# Patient Record
Sex: Male | Born: 1935 | Race: White | Hispanic: No | Marital: Married | Smoking: Former smoker
Health system: Southern US, Community
[De-identification: ages and names within clinical notes are randomized; demographics above are authoritative.]

## PROBLEM LIST (undated history)

## (undated) DIAGNOSIS — I10 Essential (primary) hypertension: Secondary | ICD-10-CM

## (undated) DIAGNOSIS — M1990 Unspecified osteoarthritis, unspecified site: Secondary | ICD-10-CM

## (undated) DIAGNOSIS — C884 Extranodal marginal zone b-cell lymphoma of mucosa-associated lymphoid tissue (malt-lymphoma) not having achieved remission: Secondary | ICD-10-CM

## (undated) DIAGNOSIS — I701 Atherosclerosis of renal artery: Secondary | ICD-10-CM

## (undated) DIAGNOSIS — R001 Bradycardia, unspecified: Secondary | ICD-10-CM

## (undated) DIAGNOSIS — E785 Hyperlipidemia, unspecified: Secondary | ICD-10-CM

## (undated) DIAGNOSIS — I428 Other cardiomyopathies: Secondary | ICD-10-CM

## (undated) DIAGNOSIS — G459 Transient cerebral ischemic attack, unspecified: Secondary | ICD-10-CM

## (undated) DIAGNOSIS — I5042 Chronic combined systolic (congestive) and diastolic (congestive) heart failure: Secondary | ICD-10-CM

## (undated) DIAGNOSIS — K219 Gastro-esophageal reflux disease without esophagitis: Secondary | ICD-10-CM

## (undated) DIAGNOSIS — I350 Nonrheumatic aortic (valve) stenosis: Secondary | ICD-10-CM

## (undated) DIAGNOSIS — I2510 Atherosclerotic heart disease of native coronary artery without angina pectoris: Secondary | ICD-10-CM

## (undated) DIAGNOSIS — K5792 Diverticulitis of intestine, part unspecified, without perforation or abscess without bleeding: Secondary | ICD-10-CM

## (undated) DIAGNOSIS — N529 Male erectile dysfunction, unspecified: Secondary | ICD-10-CM

## (undated) HISTORY — DX: Essential (primary) hypertension: I10

## (undated) HISTORY — DX: Hyperlipidemia, unspecified: E78.5

## (undated) HISTORY — DX: Other cardiomyopathies: I42.8

## (undated) HISTORY — DX: Extranodal marginal zone b-cell lymphoma of mucosa-associated lymphoid tissue (malt-lymphoma) not having achieved remission: C88.40

## (undated) HISTORY — PX: ESOPHAGOGASTRODUODENOSCOPY: SHX1529

## (undated) HISTORY — DX: Chronic combined systolic (congestive) and diastolic (congestive) heart failure: I50.42

## (undated) HISTORY — DX: Extranodal marginal zone B-cell lymphoma of mucosa-associated lymphoid tissue (MALT-lymphoma): C88.4

## (undated) HISTORY — DX: Unspecified osteoarthritis, unspecified site: M19.90

## (undated) HISTORY — DX: Gastro-esophageal reflux disease without esophagitis: K21.9

## (undated) HISTORY — DX: Male erectile dysfunction, unspecified: N52.9

## (undated) HISTORY — DX: Diverticulitis of intestine, part unspecified, without perforation or abscess without bleeding: K57.92

## (undated) HISTORY — DX: Atherosclerotic heart disease of native coronary artery without angina pectoris: I25.10

## (undated) HISTORY — PX: INGUINAL HERNIA REPAIR: SUR1180

---
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
## 1998-02-13 ENCOUNTER — Ambulatory Visit (HOSPITAL_BASED_OUTPATIENT_CLINIC_OR_DEPARTMENT_OTHER): Admission: RE | Admit: 1998-02-13 | Discharge: 1998-02-13 | Payer: Self-pay | Admitting: Surgery

## 1998-06-17 ENCOUNTER — Ambulatory Visit (HOSPITAL_COMMUNITY): Admission: RE | Admit: 1998-06-17 | Discharge: 1998-06-17 | Payer: Self-pay | Admitting: Gastroenterology

## 1998-06-27 ENCOUNTER — Ambulatory Visit (HOSPITAL_COMMUNITY): Admission: RE | Admit: 1998-06-27 | Discharge: 1998-06-27 | Payer: Self-pay | Admitting: Gastroenterology

## 1998-06-30 ENCOUNTER — Ambulatory Visit (HOSPITAL_COMMUNITY): Admission: RE | Admit: 1998-06-30 | Discharge: 1998-06-30 | Payer: Self-pay | Admitting: Gastroenterology

## 1998-06-30 ENCOUNTER — Encounter: Payer: Self-pay | Admitting: Gastroenterology

## 1998-08-14 ENCOUNTER — Ambulatory Visit (HOSPITAL_COMMUNITY): Admission: RE | Admit: 1998-08-14 | Discharge: 1998-08-14 | Payer: Self-pay | Admitting: Gastroenterology

## 1998-10-20 ENCOUNTER — Encounter: Payer: Self-pay | Admitting: Hematology & Oncology

## 1998-10-20 ENCOUNTER — Ambulatory Visit (HOSPITAL_COMMUNITY): Admission: RE | Admit: 1998-10-20 | Discharge: 1998-10-20 | Payer: Self-pay | Admitting: Hematology & Oncology

## 1998-12-19 ENCOUNTER — Emergency Department (HOSPITAL_COMMUNITY): Admission: EM | Admit: 1998-12-19 | Discharge: 1998-12-19 | Payer: Self-pay

## 1999-03-11 ENCOUNTER — Encounter (INDEPENDENT_AMBULATORY_CARE_PROVIDER_SITE_OTHER): Payer: Self-pay

## 1999-03-11 ENCOUNTER — Ambulatory Visit (HOSPITAL_COMMUNITY): Admission: RE | Admit: 1999-03-11 | Discharge: 1999-03-11 | Payer: Self-pay | Admitting: Gastroenterology

## 1999-04-20 HISTORY — PX: AORTIC VALVE REPLACEMENT: SHX41

## 1999-04-20 HISTORY — PX: CORONARY ARTERY BYPASS GRAFT: SHX141

## 1999-08-27 ENCOUNTER — Ambulatory Visit (HOSPITAL_COMMUNITY): Admission: RE | Admit: 1999-08-27 | Discharge: 1999-08-27 | Payer: Self-pay | Admitting: Gastroenterology

## 1999-08-27 ENCOUNTER — Encounter (INDEPENDENT_AMBULATORY_CARE_PROVIDER_SITE_OTHER): Payer: Self-pay | Admitting: Specialist

## 2000-02-22 ENCOUNTER — Encounter: Payer: Self-pay | Admitting: Cardiology

## 2000-02-22 ENCOUNTER — Encounter: Admission: RE | Admit: 2000-02-22 | Discharge: 2000-02-22 | Payer: Self-pay | Admitting: Cardiology

## 2000-02-29 ENCOUNTER — Ambulatory Visit (HOSPITAL_COMMUNITY): Admission: RE | Admit: 2000-02-29 | Discharge: 2000-02-29 | Payer: Self-pay | Admitting: Cardiology

## 2000-02-29 ENCOUNTER — Encounter (HOSPITAL_COMMUNITY): Payer: Self-pay | Admitting: Dentistry

## 2000-03-03 ENCOUNTER — Encounter: Payer: Self-pay | Admitting: Cardiothoracic Surgery

## 2000-03-03 ENCOUNTER — Inpatient Hospital Stay (HOSPITAL_COMMUNITY): Admission: RE | Admit: 2000-03-03 | Discharge: 2000-03-09 | Payer: Self-pay | Admitting: Cardiothoracic Surgery

## 2000-03-03 ENCOUNTER — Encounter (INDEPENDENT_AMBULATORY_CARE_PROVIDER_SITE_OTHER): Payer: Self-pay | Admitting: Specialist

## 2000-03-04 ENCOUNTER — Encounter: Payer: Self-pay | Admitting: Cardiothoracic Surgery

## 2000-03-05 ENCOUNTER — Encounter: Payer: Self-pay | Admitting: Cardiothoracic Surgery

## 2000-03-05 ENCOUNTER — Encounter: Payer: Self-pay | Admitting: Thoracic Surgery (Cardiothoracic Vascular Surgery)

## 2000-04-01 ENCOUNTER — Encounter: Admission: RE | Admit: 2000-04-01 | Discharge: 2000-04-01 | Payer: Self-pay | Admitting: Cardiothoracic Surgery

## 2000-04-01 ENCOUNTER — Encounter: Payer: Self-pay | Admitting: Cardiothoracic Surgery

## 2000-04-26 ENCOUNTER — Encounter (HOSPITAL_COMMUNITY): Admission: RE | Admit: 2000-04-26 | Discharge: 2000-07-25 | Payer: Self-pay | Admitting: Cardiology

## 2000-10-25 ENCOUNTER — Encounter (INDEPENDENT_AMBULATORY_CARE_PROVIDER_SITE_OTHER): Payer: Self-pay | Admitting: Specialist

## 2000-10-25 ENCOUNTER — Ambulatory Visit (HOSPITAL_COMMUNITY): Admission: RE | Admit: 2000-10-25 | Discharge: 2000-10-25 | Payer: Self-pay | Admitting: Gastroenterology

## 2001-11-15 ENCOUNTER — Ambulatory Visit (HOSPITAL_COMMUNITY): Admission: RE | Admit: 2001-11-15 | Discharge: 2001-11-15 | Payer: Self-pay | Admitting: Gastroenterology

## 2001-11-15 ENCOUNTER — Encounter (INDEPENDENT_AMBULATORY_CARE_PROVIDER_SITE_OTHER): Payer: Self-pay | Admitting: Specialist

## 2002-04-25 ENCOUNTER — Encounter: Payer: Self-pay | Admitting: Internal Medicine

## 2004-04-01 ENCOUNTER — Encounter: Admission: RE | Admit: 2004-04-01 | Discharge: 2004-04-01 | Payer: Self-pay | Admitting: Gastroenterology

## 2004-04-01 ENCOUNTER — Encounter: Payer: Self-pay | Admitting: Internal Medicine

## 2004-06-29 ENCOUNTER — Ambulatory Visit: Payer: Self-pay | Admitting: Internal Medicine

## 2004-08-31 ENCOUNTER — Ambulatory Visit: Payer: Self-pay | Admitting: Internal Medicine

## 2005-01-21 ENCOUNTER — Ambulatory Visit: Payer: Self-pay | Admitting: Internal Medicine

## 2005-07-21 ENCOUNTER — Ambulatory Visit: Payer: Self-pay | Admitting: Internal Medicine

## 2005-08-23 ENCOUNTER — Ambulatory Visit: Payer: Self-pay | Admitting: Internal Medicine

## 2005-10-04 ENCOUNTER — Ambulatory Visit: Payer: Self-pay | Admitting: Internal Medicine

## 2005-10-11 ENCOUNTER — Encounter: Payer: Self-pay | Admitting: Internal Medicine

## 2006-01-17 ENCOUNTER — Ambulatory Visit: Payer: Self-pay | Admitting: Internal Medicine

## 2006-04-21 ENCOUNTER — Encounter: Payer: Self-pay | Admitting: Internal Medicine

## 2006-07-21 ENCOUNTER — Ambulatory Visit: Payer: Self-pay | Admitting: Internal Medicine

## 2006-07-21 LAB — CONVERTED CEMR LAB
CO2: 32 meq/L (ref 19–32)
Calcium: 9.3 mg/dL (ref 8.4–10.5)
Cholesterol: 169 mg/dL (ref 0–200)
Direct LDL: 98.3 mg/dL
GFR calc Af Amer: 121 mL/min
Glucose, Bld: 105 mg/dL — ABNORMAL HIGH (ref 70–99)
Phosphorus: 3.7 mg/dL (ref 2.3–4.6)
Potassium: 3.8 meq/L (ref 3.5–5.1)
Sodium: 139 meq/L (ref 135–145)
Total CHOL/HDL Ratio: 3.4
Triglycerides: 235 mg/dL (ref 0–149)
VLDL: 47 mg/dL — ABNORMAL HIGH (ref 0–40)

## 2006-09-07 ENCOUNTER — Encounter: Payer: Self-pay | Admitting: Internal Medicine

## 2006-09-18 DIAGNOSIS — G459 Transient cerebral ischemic attack, unspecified: Secondary | ICD-10-CM

## 2006-09-18 HISTORY — DX: Transient cerebral ischemic attack, unspecified: G45.9

## 2006-09-27 ENCOUNTER — Telehealth (INDEPENDENT_AMBULATORY_CARE_PROVIDER_SITE_OTHER): Payer: Self-pay | Admitting: *Deleted

## 2006-10-05 ENCOUNTER — Encounter: Payer: Self-pay | Admitting: Internal Medicine

## 2006-10-05 ENCOUNTER — Ambulatory Visit: Payer: Self-pay | Admitting: Vascular Surgery

## 2006-10-05 ENCOUNTER — Observation Stay (HOSPITAL_COMMUNITY): Admission: EM | Admit: 2006-10-05 | Discharge: 2006-10-05 | Payer: Self-pay | Admitting: Emergency Medicine

## 2006-10-05 ENCOUNTER — Ambulatory Visit: Payer: Self-pay | Admitting: Internal Medicine

## 2006-10-07 DIAGNOSIS — M1990 Unspecified osteoarthritis, unspecified site: Secondary | ICD-10-CM | POA: Insufficient documentation

## 2006-10-07 DIAGNOSIS — K219 Gastro-esophageal reflux disease without esophagitis: Secondary | ICD-10-CM

## 2006-10-07 DIAGNOSIS — J309 Allergic rhinitis, unspecified: Secondary | ICD-10-CM | POA: Insufficient documentation

## 2006-10-07 DIAGNOSIS — G20 Parkinson's disease: Secondary | ICD-10-CM

## 2006-10-07 DIAGNOSIS — K5730 Diverticulosis of large intestine without perforation or abscess without bleeding: Secondary | ICD-10-CM | POA: Insufficient documentation

## 2006-10-07 DIAGNOSIS — M810 Age-related osteoporosis without current pathological fracture: Secondary | ICD-10-CM | POA: Insufficient documentation

## 2006-10-07 DIAGNOSIS — C884 Extranodal marginal zone B-cell lymphoma of mucosa-associated lymphoid tissue [MALT-lymphoma]: Secondary | ICD-10-CM

## 2006-10-11 ENCOUNTER — Telehealth (INDEPENDENT_AMBULATORY_CARE_PROVIDER_SITE_OTHER): Payer: Self-pay | Admitting: *Deleted

## 2006-10-13 ENCOUNTER — Ambulatory Visit: Payer: Self-pay | Admitting: Internal Medicine

## 2006-10-13 DIAGNOSIS — F0280 Dementia in other diseases classified elsewhere without behavioral disturbance: Secondary | ICD-10-CM

## 2006-10-13 DIAGNOSIS — E785 Hyperlipidemia, unspecified: Secondary | ICD-10-CM

## 2006-10-13 DIAGNOSIS — G20 Parkinson's disease: Secondary | ICD-10-CM

## 2006-10-20 ENCOUNTER — Encounter: Payer: Self-pay | Admitting: Internal Medicine

## 2006-10-27 ENCOUNTER — Encounter: Payer: Self-pay | Admitting: Internal Medicine

## 2006-12-01 ENCOUNTER — Encounter (INDEPENDENT_AMBULATORY_CARE_PROVIDER_SITE_OTHER): Payer: Self-pay | Admitting: *Deleted

## 2006-12-21 ENCOUNTER — Encounter: Payer: Self-pay | Admitting: Internal Medicine

## 2007-01-20 ENCOUNTER — Ambulatory Visit: Payer: Self-pay | Admitting: Internal Medicine

## 2007-05-22 ENCOUNTER — Ambulatory Visit: Payer: Self-pay | Admitting: Internal Medicine

## 2007-05-24 LAB — CONVERTED CEMR LAB
ALT: 14 units/L (ref 0–53)
BUN: 17 mg/dL (ref 6–23)
Basophils Relative: 0 % (ref 0.0–1.0)
CO2: 34 meq/L — ABNORMAL HIGH (ref 19–32)
Calcium: 9.8 mg/dL (ref 8.4–10.5)
Chloride: 98 meq/L (ref 96–112)
Eosinophils Absolute: 0.5 10*3/uL (ref 0.0–0.6)
Eosinophils Relative: 5 % (ref 0.0–5.0)
GFR calc Af Amer: 93 mL/min
Glucose, Bld: 97 mg/dL (ref 70–99)
HCT: 45.6 % (ref 39.0–52.0)
LDL Cholesterol: 96 mg/dL (ref 0–99)
Neutrophils Relative %: 66.8 % (ref 43.0–77.0)
Platelets: 339 10*3/uL (ref 150–400)
Potassium: 4 meq/L (ref 3.5–5.1)
RBC: 5.01 M/uL (ref 4.22–5.81)
RDW: 12.6 % (ref 11.5–14.6)
TSH: 3.31 microintl units/mL (ref 0.35–5.50)
Total CHOL/HDL Ratio: 3.5
Triglycerides: 181 mg/dL — ABNORMAL HIGH (ref 0–149)
VLDL: 36 mg/dL (ref 0–40)
WBC: 9.3 10*3/uL (ref 4.5–10.5)

## 2007-06-07 ENCOUNTER — Encounter: Payer: Self-pay | Admitting: Internal Medicine

## 2007-09-13 ENCOUNTER — Telehealth: Payer: Self-pay | Admitting: Internal Medicine

## 2007-09-18 HISTORY — PX: CATARACT EXTRACTION: SUR2

## 2007-09-22 ENCOUNTER — Ambulatory Visit: Payer: Self-pay | Admitting: Internal Medicine

## 2007-11-07 ENCOUNTER — Encounter: Payer: Self-pay | Admitting: Internal Medicine

## 2007-11-18 HISTORY — PX: CORONARY STENT PLACEMENT: SHX1402

## 2007-11-22 ENCOUNTER — Encounter: Payer: Self-pay | Admitting: Internal Medicine

## 2007-11-22 HISTORY — PX: NM MYOCAR PERF WALL MOTION: HXRAD629

## 2007-12-01 ENCOUNTER — Encounter: Admission: RE | Admit: 2007-12-01 | Discharge: 2007-12-01 | Payer: Self-pay | Admitting: Cardiology

## 2007-12-05 ENCOUNTER — Inpatient Hospital Stay (HOSPITAL_COMMUNITY): Admission: AD | Admit: 2007-12-05 | Discharge: 2007-12-06 | Payer: Self-pay | Admitting: Cardiovascular Disease

## 2007-12-06 ENCOUNTER — Encounter: Payer: Self-pay | Admitting: Internal Medicine

## 2007-12-22 ENCOUNTER — Encounter: Payer: Self-pay | Admitting: Internal Medicine

## 2008-01-15 ENCOUNTER — Encounter: Payer: Self-pay | Admitting: Internal Medicine

## 2008-01-29 ENCOUNTER — Ambulatory Visit: Payer: Self-pay | Admitting: Internal Medicine

## 2008-01-31 ENCOUNTER — Encounter: Payer: Self-pay | Admitting: Internal Medicine

## 2008-03-06 ENCOUNTER — Encounter: Payer: Self-pay | Admitting: Internal Medicine

## 2008-03-28 ENCOUNTER — Ambulatory Visit: Payer: Self-pay | Admitting: Internal Medicine

## 2008-04-04 ENCOUNTER — Encounter: Payer: Self-pay | Admitting: Internal Medicine

## 2008-04-04 ENCOUNTER — Telehealth: Payer: Self-pay | Admitting: Internal Medicine

## 2008-04-23 ENCOUNTER — Telehealth: Payer: Self-pay | Admitting: Family Medicine

## 2008-04-30 ENCOUNTER — Ambulatory Visit: Payer: Self-pay | Admitting: Internal Medicine

## 2008-05-01 LAB — CONVERTED CEMR LAB
Albumin: 3.8 g/dL (ref 3.5–5.2)
Basophils Absolute: 0 10*3/uL (ref 0.0–0.1)
Basophils Relative: 0.4 % (ref 0.0–3.0)
CO2: 34 meq/L — ABNORMAL HIGH (ref 19–32)
Chloride: 105 meq/L (ref 96–112)
Cholesterol: 174 mg/dL (ref 0–200)
Eosinophils Absolute: 0.4 10*3/uL (ref 0.0–0.7)
Eosinophils Relative: 5.5 % — ABNORMAL HIGH (ref 0.0–5.0)
GFR calc Af Amer: 105 mL/min
GFR calc non Af Amer: 87 mL/min
HCT: 43.2 % (ref 39.0–52.0)
HDL: 51.5 mg/dL (ref >39.0)
MCHC: 34.4 g/dL (ref 30.0–36.0)
MCV: 90.5 fL (ref 78.0–100.0)
Monocytes Absolute: 0.7 10*3/uL (ref 0.1–1.0)
Potassium: 4.3 meq/L (ref 3.5–5.1)
RBC: 4.78 M/uL (ref 4.22–5.81)
Sodium: 141 meq/L (ref 135–145)
Total Protein: 6.9 g/dL (ref 6.0–8.3)
VLDL: 10 mg/dL (ref 0–40)
Vitamin B-12: 273 pg/mL (ref 211–911)
WBC: 7.2 10*3/uL (ref 4.5–10.5)

## 2008-05-15 ENCOUNTER — Ambulatory Visit: Payer: Self-pay | Admitting: Internal Medicine

## 2008-06-11 ENCOUNTER — Encounter: Payer: Self-pay | Admitting: Internal Medicine

## 2008-07-03 ENCOUNTER — Encounter: Payer: Self-pay | Admitting: Internal Medicine

## 2008-07-31 ENCOUNTER — Telehealth: Payer: Self-pay | Admitting: Internal Medicine

## 2008-09-30 ENCOUNTER — Ambulatory Visit: Payer: Self-pay | Admitting: Internal Medicine

## 2008-10-02 LAB — CONVERTED CEMR LAB
ALT: 5 units/L (ref 0–53)
AST: 21 units/L (ref 0–37)
Albumin: 3.9 g/dL (ref 3.5–5.2)
Cholesterol: 182 mg/dL (ref 0–200)
Glucose, Bld: 78 mg/dL (ref 70–99)
HDL: 57.1 mg/dL (ref >39.00)
Phosphorus: 3.8 mg/dL (ref 2.3–4.6)
Potassium: 4.4 meq/L (ref 3.5–5.1)
Sodium: 141 meq/L (ref 135–145)
Total Bilirubin: 0.7 mg/dL (ref 0.3–1.2)
Total Protein: 6.8 g/dL (ref 6.0–8.3)
Triglycerides: 128 mg/dL (ref 0.0–149.0)

## 2008-10-28 ENCOUNTER — Encounter: Payer: Self-pay | Admitting: Internal Medicine

## 2008-12-31 ENCOUNTER — Telehealth: Payer: Self-pay | Admitting: Internal Medicine

## 2009-01-29 ENCOUNTER — Ambulatory Visit: Payer: Self-pay | Admitting: Internal Medicine

## 2009-02-18 ENCOUNTER — Encounter: Payer: Self-pay | Admitting: Internal Medicine

## 2009-04-02 ENCOUNTER — Ambulatory Visit: Payer: Self-pay | Admitting: Internal Medicine

## 2009-04-04 LAB — CONVERTED CEMR LAB
ALT: 6 units/L (ref 0–53)
AST: 18 units/L (ref 0–37)
BUN: 9 mg/dL (ref 6–23)
CO2: 33 meq/L — ABNORMAL HIGH (ref 19–32)
Chloride: 102 meq/L (ref 96–112)
Eosinophils Relative: 4.5 % (ref 0.0–5.0)
GFR calc non Af Amer: 99.35 mL/min (ref >60)
HCT: 42.6 % (ref 39.0–52.0)
Hemoglobin: 14.5 g/dL (ref 13.0–17.0)
Lymphs Abs: 1.5 10*3/uL (ref 0.7–4.0)
Monocytes Relative: 8.4 % (ref 3.0–12.0)
Neutro Abs: 5.7 10*3/uL (ref 1.4–7.7)
TSH: 2.96 microintl units/mL (ref 0.35–5.50)
Total CHOL/HDL Ratio: 3
Total Protein: 6.8 g/dL (ref 6.0–8.3)
Triglycerides: 78 mg/dL (ref 0.0–149.0)
WBC: 8.4 10*3/uL (ref 4.5–10.5)

## 2009-05-01 ENCOUNTER — Telehealth: Payer: Self-pay | Admitting: Internal Medicine

## 2009-06-20 ENCOUNTER — Encounter: Payer: Self-pay | Admitting: Internal Medicine

## 2009-08-03 ENCOUNTER — Emergency Department (HOSPITAL_COMMUNITY): Admission: EM | Admit: 2009-08-03 | Discharge: 2009-08-03 | Payer: Self-pay | Admitting: Emergency Medicine

## 2009-08-03 ENCOUNTER — Encounter: Payer: Self-pay | Admitting: Internal Medicine

## 2009-09-09 ENCOUNTER — Encounter: Payer: Self-pay | Admitting: Internal Medicine

## 2009-09-29 ENCOUNTER — Ambulatory Visit: Payer: Self-pay | Admitting: Internal Medicine

## 2009-10-17 HISTORY — PX: LAPAROSCOPIC CHOLECYSTECTOMY: SUR755

## 2009-11-03 ENCOUNTER — Observation Stay (HOSPITAL_COMMUNITY): Admission: RE | Admit: 2009-11-03 | Discharge: 2009-11-04 | Payer: Self-pay | Admitting: General Surgery

## 2009-11-03 ENCOUNTER — Encounter (INDEPENDENT_AMBULATORY_CARE_PROVIDER_SITE_OTHER): Payer: Self-pay | Admitting: General Surgery

## 2009-11-24 ENCOUNTER — Encounter: Payer: Self-pay | Admitting: Internal Medicine

## 2009-12-25 ENCOUNTER — Encounter: Payer: Self-pay | Admitting: Internal Medicine

## 2010-01-20 ENCOUNTER — Ambulatory Visit: Payer: Self-pay | Admitting: Internal Medicine

## 2010-02-18 ENCOUNTER — Encounter: Payer: Self-pay | Admitting: Internal Medicine

## 2010-03-02 ENCOUNTER — Ambulatory Visit: Payer: Self-pay | Admitting: Internal Medicine

## 2010-03-03 LAB — CONVERTED CEMR LAB
ALT: 6 units/L (ref 0–53)
Bilirubin, Direct: 0.1 mg/dL (ref 0.0–0.3)
Creatinine, Ser: 0.8 mg/dL (ref 0.4–1.5)
Eosinophils Relative: 5 % (ref 0.0–5.0)
GFR calc non Af Amer: 100.57 mL/min (ref >60)
Glucose, Bld: 96 mg/dL (ref 70–99)
MCV: 92.9 fL (ref 78.0–100.0)
Monocytes Absolute: 0.7 10*3/uL (ref 0.1–1.0)
Monocytes Relative: 7.8 % (ref 3.0–12.0)
Neutrophils Relative %: 65.9 % (ref 43.0–77.0)
Platelets: 300 10*3/uL (ref 150.0–400.0)
Sodium: 137 meq/L (ref 135–145)
Total Bilirubin: 0.6 mg/dL (ref 0.3–1.2)
WBC: 9.6 10*3/uL (ref 4.5–10.5)

## 2010-03-06 ENCOUNTER — Encounter (INDEPENDENT_AMBULATORY_CARE_PROVIDER_SITE_OTHER): Payer: Self-pay | Admitting: *Deleted

## 2010-05-19 NOTE — Letter (Signed)
Summary: [facility]   Imported By: Lanelle Bal 12/11/2009 08:39:44  _____________________________________________________________________  External Attachment:    Type:   Image     Comment:   External Document  Appended Document: [facility] post op lap chole and umbilical hernia repair doing okay as needed follow up

## 2010-05-19 NOTE — Assessment & Plan Note (Signed)
Summary: 6 m f/u dlo   Vital Signs:  Patient profile:   77 year old male Weight:      166 pounds BMI:     26.09 Temp:     98.5 degrees F oral Pulse rate:   72 / minute Pulse rhythm:   regular BP sitting:   120 / 60  (left arm) Cuff size:   large  Vitals Entered By: Mervin Hack CMA Duncan Dull) (March 02, 2010 11:07 AM) CC: 6 month follow-up   History of Present Illness: Recovered from gallbladder surgery in July Saw Dr Allyson Sabal from Dr Clarene Duke due to change in renal ultrasound Plans to just repeat the doppler before considering stents  Parkinsons is slowly worsening---increasing intensity of the tremors has trouble with fine movements. Able to eat but takes longer--has trouble with soup. has to be very careful with liquids He has decided against the deep brain stimulation  No stomach trouble continues on PPI but only as needed   No arthritis problems occ has hand stiffness  No chest pain No SOB Not checking home unit---not consistent  Allergies: 1)  ! * Hctz 2)  Penicillin G Potassium (Penicillin G Potassium) 3)  Zocor (Simvastatin) 4)  Sinemet (Carbidopa-Levodopa) 5)  Mirapex (Pramipexole Dihydrochloride) 6)  Amlodipine Besylate (Amlodipine Besylate)  Past History:  Past medical, surgical, family and social histories (including risk factors) reviewed for relevance to current acute and chronic problems.  Past Medical History: Reviewed history from 09/30/2008 and no changes required. Coronary artery disease-------------------------------------Dr Little Allergic rhinitis Diverticulosis, colon GERD Hypertension Osteoarthritis--spine Osteoporosis--hip MALT lymphoma Parkinson's ASCVD---TIA  6/08 Hyperlipidemia Erectile dysfunction  Past Surgical History: Reviewed history from 12/24/2009 and no changes required. Coronary artery bypass graft Zenaida Niece trigt ) aortic valve replacement 2001 Cataract extraction right  03/2003 EGD  multiple Pilonidal cyst 1953 LIH  1997Llap. bilateral hernias 1998 Cardiolite neg.  ef 64% 07/2004 Colon/ EGD benign 11/2004 8/09  Stent by Dr Rosie Fate started and aggrenox stopped 6/09 Left cataract 7/11 Lap chole    Dr Abbey Chatters  Family History: Reviewed history from 10/07/2006 and no changes required. Dad died @46  acute bronchitis, cotton exposure Mom died @91  old age 70 brothers--1 died of heart trouble 1 sister Daughter died of osteosarcoma  Social History: Reviewed history from 05/22/2007 and no changes required. 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   Neck:  supple, no masses, no thyromegaly, no carotid bruits, and no cervical lymphadenopathy.   Lungs:  normal respiratory effort, no intercostal retractions, no accessory muscle use, and normal breath sounds.   Heart:  normal rate and no gallop.   Gr 3/6 systolic murmur at base Bigemial rhythm Abdomen:  soft and non-tender no bruits Extremities:  no edema Neurologic:  resting tremor mostly in hands and slight lips moderate bradykinesia mild increased tone Psych:  normally interactive, good eye contact, not anxious appearing, and not depressed appearing.     Impression & Recommendations:  Problem # 1:  HYPERTENSION (ICD-401.9) Assessment Unchanged  good control no changes needed argues against sig renal artery stenosis  His updated medication list for this problem includes:  Bisoprolol Fumarate 5 Mg Tabs (Bisoprolol fumarate) .Marland Kitchen... Take 1 tablet by mouth once a day  BP today: 120/60 Prior BP: 126/62 (09/29/2009)  Labs Reviewed: K+:  4.1 (04/02/2009) Creat: : 0.8 (04/02/2009)   Chol: 175 (04/02/2009)   HDL: 53.60 (04/02/2009)   LDL: 106 (04/02/2009)   TG: 78.0 (04/02/2009)  Orders: Venipuncture (98119) TLB-Renal Function Panel (80069-RENAL) TLB-CBC Platelet - w/Differential (85025-CBCD) TLB-Hepatic/Liver Function Pnl (80076-HEPATIC) TLB-TSH (Thyroid Stimulating Hormone) (84443-TSH)  Problem # 2:  PARKINSON'S DISEASE (ICD-332.0) Assessment: Unchanged moderate with sig disability has decided against deep brain stimulation  Problem # 3:  CORONARY ARTERY DISEASE (ICD-414.00) Assessment: Unchanged has been quiet  His updated medication list for this problem includes:    Bisoprolol Fumarate 5 Mg Tabs (Bisoprolol fumarate) .Marland Kitchen... Take 1 tablet by mouth once a day    Aspirin 325 Mg Tabs (Aspirin) .Marland Kitchen... Take 1 by mouth once daily  Problem # 4:  HYPERLIPIDEMIA (ICD-272.4) had been on meds in the past but had side effects LDL only 106 without meds won't recheck or treat  Labs Reviewed: SGOT: 18 (04/02/2009)   SGPT: 6 (04/02/2009)   HDL:53.60 (04/02/2009), 57.10 (09/30/2008)  LDL:106 (04/02/2009), 99 (14/78/2956)  Chol:175 (04/02/2009), 182 (09/30/2008)  Trig:78.0 (04/02/2009), 128.0 (09/30/2008)  Complete Medication List: 1)  Bisoprolol Fumarate 5 Mg Tabs (Bisoprolol fumarate) .... Take 1 tablet by mouth once a day 2)  Carbidopa-levodopa Cr 25-100 Mg Cr-tabs (Carbidopa-levodopa) .... Take 5 tablets by mouth daily 3)  Pantoprazole Sodium 40 Mg Tbec (Pantoprazole sodium) .... Take 1 by mouth once daily as needed 4)  Ropinirole Hcl 0.25 Mg Tabs (Ropinirole hcl) .... Take 1 by mouth three times a day 5)  Aspirin 325 Mg Tabs (Aspirin) .... Take 1 by mouth once daily  Patient Instructions: 1)  Please schedule a follow-up appointment in 6 months .    Orders Added: 1)  Est. Patient Level IV [21308] 2)  Venipuncture [36415] 3)  TLB-Renal Function Panel [80069-RENAL] 4)  TLB-CBC Platelet - w/Differential [85025-CBCD] 5)   TLB-Hepatic/Liver Function Pnl [80076-HEPATIC] 6)  TLB-TSH (Thyroid Stimulating Hormone) [65784-ONG]    Current Allergies (reviewed today): ! * HCTZ PENICILLIN G POTASSIUM (PENICILLIN G POTASSIUM) ZOCOR (SIMVASTATIN) SINEMET (CARBIDOPA-LEVODOPA) MIRAPEX (PRAMIPEXOLE DIHYDROCHLORIDE) AMLODIPINE BESYLATE (AMLODIPINE BESYLATE)

## 2010-05-19 NOTE — Letter (Signed)
Summary: [place] No Show Letter  [place] [facility]  [address] [place], [place] [address]   Phone: [phone]  Fax: [phone]    03/06/2010 MRN: [id number]  SANTIEL TOPPER [address] [place], [place]  [address]   Dear Mr. [facility],   Our records indicate that you missed your scheduled appointment with __Lab___________________ on __11.17.11__________.  Please contact this office to reschedule your appointment as soon as possible.  It is important that you keep your scheduled appointments with your physician, so we can provide you the best care possible.  Please be advised that there may be a charge for "no show" appointments.    Sincerely,   [place] at [facility]

## 2010-05-19 NOTE — Letter (Signed)
Summary: [facility]  [facility]   Imported By: Lanelle Bal 09/17/2009 10:10:10  _____________________________________________________________________  External Attachment:    Type:   Image     Comment:   External Document  Appended Document: [facility] medically refractory Parkinsons recommending deep brain stimulation but patient not sure---he will consider

## 2010-05-19 NOTE — Assessment & Plan Note (Signed)
Summary: 6 M F/U DLO   Vital Signs:  Patient profile:   77 year old male Weight:      167 pounds Temp:     98.5 degrees F oral Pulse rate:   68 / minute Pulse rhythm:   regular BP sitting:   126 / 62  (left arm) Cuff size:   large  Vitals Entered By: Mervin Hack CMA Duncan Dull) (September 29, 2009 10:42 AM) CC: 6 month follow-up   History of Present Illness: Doing okay Scheduled to have gallbaldder removed in July Seen in ER and had ultrasound Gallstones found and sludge discussed ---makes sense to take this out before another spell  Neurologists want to go ahead with deep brain stimulation He really wants to hold off Very limited fine hand control Eating is problematic--has to go very slow Soup, coffee--can be very difficult. Right hand worse--uses left hand to control it Personal care can be difficult still uses hand razor--has electric but doesn't use it  Heart has been fine No chest pain No SOB  no trouble with meds Does note more trouble with cloudy vision Sometimes this improves with glasses off for a while  No sig arthritis pain  Allergies: 1)  ! * Hctz 2)  Penicillin G Potassium (Penicillin G Potassium) 3)  Zocor (Simvastatin) 4)  Sinemet (Carbidopa-Levodopa) 5)  Mirapex (Pramipexole Dihydrochloride) 6)  Amlodipine Besylate (Amlodipine Besylate)  Past History:  Past medical, surgical, family and social histories (including risk factors) reviewed for relevance to current acute and chronic problems.  Past Medical History: Reviewed history from 09/30/2008 and no changes required. Coronary artery disease-------------------------------------Dr Little Allergic rhinitis Diverticulosis, colon GERD Hypertension Osteoarthritis--spine Osteoporosis--hip MALT lymphoma Parkinson's ASCVD---TIA  6/08 Hyperlipidemia Erectile dysfunction  Past Surgical History: Reviewed history from 03/28/2008 and no changes required. Coronary artery bypass graft Zenaida Niece trigt )  aortic valve replacement 2001 Cataract extraction right  03/2003 EGD  multiple Pilonidal cyst 1953 LIH 1997Llap. bilateral hernias 1998 Cardiolite neg.  ef 64% 07/2004 Colon/ EGD benign 11/2004 8/09  Stent by Dr Rosie Fate started and aggrenox stopped 6/09 Left cataract  Family History: Reviewed history from 10/07/2006 and no changes required. Dad died @46  acute bronchitis, cotton exposure Mom died @91  old age 54 brothers--1 died of heart trouble 1 sister Daughter died of osteosarcoma  Social History: Reviewed history from 05/22/2007 and no changes required. 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   Neck:  supple, no masses, no thyromegaly, and no cervical lymphadenopathy.   Lungs:  normal respiratory effort and normal breath sounds.   Heart:  normal rate, regular rhythm, no murmur, and no gallop.   Msk:  no joint tenderness and no joint swelling.   Extremities:  no edema Neurologic:  alert & oriented X3.   Resting hand and head tremor Mild bradykinesia Tone fairly normal Psych:  normally interactive, good eye contact, not anxious appearing, and not depressed appearing.     Impression & Recommendations:  Problem # 1:  DEMENTIA (ICD-294.8) Assessment Comment Only mild No obvious sig progression likely related to Parkinsons Asked him to bring wife in next time  Problem # 2:  PARKINSON'S DISEASE (ICD-332.0) Assessment: Comment Only major functional issue but reasonably stable he wants to  hold off on deep brain stimulation  Problem # 3:  HYPERTENSION (ICD-401.9) Assessment: Unchanged good control no changes needed  His updated medication list for this problem includes:    Bisoprolol Fumarate 5  Mg Tabs (Bisoprolol fumarate) .Marland Kitchen... Take 1 tablet by mouth once a day  BP today: 126/62 Prior BP: 150/70 (04/02/2009)  Labs Reviewed: K+: 4.1 (04/02/2009) Creat: : 0.8 (04/02/2009)   Chol: 175 (04/02/2009)   HDL: 53.60 (04/02/2009)   LDL: 106 (04/02/2009)   TG: 78.0 (04/02/2009)  Problem # 4:  CORONARY ARTERY DISEASE (ICD-414.00) Assessment: Comment Only has been quiet AVR seems to be fine  His updated medication list for this problem includes:    Bisoprolol Fumarate 5 Mg Tabs (Bisoprolol fumarate) .Marland Kitchen... Take 1 tablet by mouth once a day    Aspirin 325 Mg Tabs (Aspirin) .Marland Kitchen... Take 1 by mouth once daily  Problem # 5:  GERD (ICD-530.81) Assessment: Unchanged stays on PPI due to MALT lymphoma history  His updated medication list for this problem includes:    Pantoprazole Sodium 40 Mg Tbec (Pantoprazole sodium) .Marland Kitchen... Take 1 by mouth once daily as needed  Problem # 6:  OSTEOARTHRITIS (ICD-715.90) Assessment: Unchanged no major issues  Problem # 7:  HYPERLIPIDEMIA (ICD-272.4) Assessment: Comment Only labs again next time  Labs Reviewed: SGOT: 18 (04/02/2009)   SGPT: 6 (04/02/2009)   HDL:53.60 (04/02/2009), 57.10 (09/30/2008)  LDL:106 (04/02/2009), 99 (16/01/9603)  Chol:175 (04/02/2009), 182 (09/30/2008)  Trig:78.0 (04/02/2009), 128.0 (09/30/2008)  Complete Medication List: 1)  Bisoprolol Fumarate 5 Mg Tabs (Bisoprolol fumarate) .... Take 1 tablet by mouth once a day 2)  Carbidopa-levodopa Cr 25-100 Mg Cr-tabs (Carbidopa-levodopa) .... Take 5 tablets by mouth daily 3)  Pantoprazole Sodium 40 Mg Tbec (Pantoprazole sodium) .... Take 1 by mouth once daily as needed 4)  Ropinirole Hcl 0.25 Mg Tabs (Ropinirole hcl) .... Take 1 by mouth three times a day 5)  Cialis 20 Mg Tabs (Tadalafil) .... 1/2-1 about 1 hour before sex 6)  Aspirin 325 Mg Tabs (Aspirin) .... Take 1 by mouth once daily 7)  Fish Oil 1000 Mg Caps (Omega-3 fatty acids) .... Take 1 by mouth two times a day  Patient  Instructions: 1)  Please schedule a follow-up appointment in 6 months .   Current Allergies (reviewed today): ! * HCTZ PENICILLIN G POTASSIUM (PENICILLIN G POTASSIUM) ZOCOR (SIMVASTATIN) SINEMET (CARBIDOPA-LEVODOPA) MIRAPEX (PRAMIPEXOLE DIHYDROCHLORIDE) AMLODIPINE BESYLATE (AMLODIPINE BESYLATE)

## 2010-05-19 NOTE — Letter (Signed)
Summary: [facility]  [facility]   Imported By: Maryln Gottron 03/05/2010 12:30:10  _____________________________________________________________________  External Attachment:    Type:   Image     Comment:   External Document  Appended Document: [facility] rechecking renal dopplers in 2 months

## 2010-05-19 NOTE — Progress Notes (Signed)
Summary: Rx Viagra  Phone Note Call from Patient Call back at Home Phone [phone]   Caller: Patient Call For: Cindee Salt MD Summary of Call: Patient came in today and dropped off a letter for you.  Wants to switch from using Cialis to Viagra.  Would like a Rx faxed to [facility] because he says [facility] is cheaper than [facility].  Letter in your IN box Initial call taken by: Linde Gillis CMA Duncan Dull),  May 01, 2009 2:57 PM  Follow-up for Phone Call        Rx sent Please let him know Follow-up by: Cindee Salt MD,  May 01, 2009 5:40 PM  Additional Follow-up for Phone Call Additional follow up Details #1::        Spoke with patient and advised results.  Additional Follow-up by: Mervin Hack CMA (AAMA),  May 02, 2009 10:02 AM    New/Updated Medications: VIAGRA 100 MG TABS (SILDENAFIL CITRATE) 1/2 to 1 tab about 30 minutes before sex Prescriptions: VIAGRA 100 MG TABS (SILDENAFIL CITRATE) 1/2 to 1 tab about 30 minutes before sex  #15 x 3   Entered and Authorized by:   Cindee Salt MD   Signed by:   Cindee Salt MD on 05/01/2009   Method used:   Electronically to        [facility]* (mail-order)             ,          Ph: [phone]       Fax: [phone]   RxID:   [id number]

## 2010-05-19 NOTE — Assessment & Plan Note (Signed)
Summary: flu/letvak/alc  Nurse Visit   Allergies: 1)  ! * Hctz 2)  Penicillin G Potassium (Penicillin G Potassium) 3)  Zocor (Simvastatin) 4)  Sinemet (Carbidopa-Levodopa) 5)  Mirapex (Pramipexole Dihydrochloride) 6)  Amlodipine Besylate (Amlodipine Besylate)  Orders Added: 1)  Flu Vaccine 51yrs + MEDICARE PATIENTS [Q2039] 2)  Administration Flu vaccine - MCR [G0008]  Flu Vaccine Consent Questions     Do you have a history of severe allergic reactions to this vaccine? no    Any prior history of allergic reactions to egg and/or gelatin? no    Do you have a sensitivity to the preservative Thimersol? no    Do you have a past history of Guillan-Barre Syndrome? no    Do you currently have an acute febrile illness? no    Have you ever had a severe reaction to latex? no    Vaccine information given and explained to patient? yes    Are you currently pregnant? no    Lot Number:[id number]   Exp Date:10/17/2010   Site Given  Left Deltoid IM

## 2010-05-19 NOTE — Letter (Signed)
Summary: [facility]  [facility]   Imported By: Lanelle Bal 01/23/2010 09:11:04  _____________________________________________________________________  External Attachment:    Type:   Image     Comment:   External Document  Appended Document: [facility] sending to Dr Allyson Sabal to consider renal angiograms

## 2010-05-19 NOTE — Letter (Signed)
Summary: [facility]  [facility]   Imported By: Lanelle Bal 06/27/2009 12:53:30  _____________________________________________________________________  External Attachment:    Type:   Image     Comment:   External Document  Appended Document: [facility] no changes

## 2010-05-26 ENCOUNTER — Encounter: Payer: Self-pay | Admitting: Internal Medicine

## 2010-05-26 LAB — CONVERTED CEMR LAB
ALT: 6 units/L (ref 0–53)
Albumin: 3.8 g/dL (ref 3.5–5.2)
Basophils Relative: 0.4 % (ref 0.0–3.0)
CO2: 33 meq/L — ABNORMAL HIGH (ref 19–32)
Calcium: 9 mg/dL (ref 8.4–10.5)
Eosinophils Absolute: 0.5 10*3/uL (ref 0.0–0.7)
Glucose, Bld: 96 mg/dL (ref 70–99)
HCT: 41.7 % (ref 39.0–52.0)
Hemoglobin: 14.2 g/dL (ref 13.0–17.0)
Lymphs Abs: 2 10*3/uL (ref 0.7–4.0)
MCHC: 34.1 g/dL (ref 30.0–36.0)
MCV: 92.9 fL (ref 78.0–100.0)
Monocytes Absolute: 0.7 10*3/uL (ref 0.1–1.0)
Neutro Abs: 6.3 10*3/uL (ref 1.4–7.7)
Potassium: 4.3 meq/L (ref 3.5–5.1)
RBC: 4.49 M/uL (ref 4.22–5.81)
TSH: 3.21 microintl units/mL (ref 0.35–5.50)
Total Protein: 6.6 g/dL (ref 6.0–8.3)

## 2010-06-12 ENCOUNTER — Telehealth: Payer: Self-pay | Admitting: Internal Medicine

## 2010-06-16 NOTE — Progress Notes (Signed)
Summary: Referral for ENT...  Phone Note Call from Patient Call back at [phone]   Caller: Patient Call For: Ian Salt MD Summary of Call: Pt came to the office, need referral for a ENT physician.  Having problems, when he eats, he is having drainage from his nose. Says it was discussed in his last visit in November. Call back # [phone].  Does not want any medication before sending a physician, would not give a pharmacy name. ..   Initial call taken by: Daine Gip  June 12, 2010 10:51 AM  Follow-up for Phone Call        okay to make referral Follow-up by: Ian Salt MD,  June 12, 2010 1:23 PM

## 2010-06-25 NOTE — Letter (Signed)
Summary: [facility]   [facility]   Imported By: Kassie Mends 06/16/2010 09:22:24  _____________________________________________________________________  External Attachment:    Type:   Image     Comment:   External Document  Appended Document: [facility]  stable renal artery stenosis

## 2010-07-05 LAB — DIFFERENTIAL
Basophils Relative: 1 % (ref 0–1)
Lymphs Abs: 1.5 10*3/uL (ref 0.7–4.0)
Monocytes Absolute: 0.6 10*3/uL (ref 0.1–1.0)
Monocytes Relative: 7 % (ref 3–12)
Neutro Abs: 6.6 10*3/uL (ref 1.7–7.7)
Neutrophils Relative %: 71 % (ref 43–77)

## 2010-07-05 LAB — CBC
MCHC: 34.1 g/dL (ref 30.0–36.0)
Platelets: 356 10*3/uL (ref 150–400)
RDW: 14.2 % (ref 11.5–15.5)
WBC: 9.3 10*3/uL (ref 4.0–10.5)

## 2010-07-05 LAB — COMPREHENSIVE METABOLIC PANEL
ALT: <8 U/L (ref 0–53)
Albumin: 3.6 g/dL (ref 3.5–5.2)
Alkaline Phosphatase: 71 U/L (ref 39–117)
BUN: 10 mg/dL (ref 6–23)
Calcium: 9.3 mg/dL (ref 8.4–10.5)
Potassium: 4.2 mEq/L (ref 3.5–5.1)
Sodium: 141 mEq/L (ref 135–145)
Total Protein: 6.9 g/dL (ref 6.0–8.3)

## 2010-07-05 LAB — PROTIME-INR: INR: 1.06 (ref 0.00–1.49)

## 2010-07-05 LAB — SURGICAL PCR SCREEN: MRSA, PCR: NEGATIVE

## 2010-07-07 LAB — LIPASE, BLOOD: Lipase: 31 U/L (ref 11–59)

## 2010-07-07 LAB — CBC
HCT: 43.2 % (ref 39.0–52.0)
Hemoglobin: 14.9 g/dL (ref 13.0–17.0)
MCV: 92.8 fL (ref 78.0–100.0)
Platelets: 296 10*3/uL (ref 150–400)
WBC: 12 10*3/uL — ABNORMAL HIGH (ref 4.0–10.5)

## 2010-07-07 LAB — URINALYSIS, ROUTINE W REFLEX MICROSCOPIC
Bilirubin Urine: NEGATIVE
Glucose, UA: NEGATIVE mg/dL
Hgb urine dipstick: NEGATIVE
Protein, ur: NEGATIVE mg/dL
Urobilinogen, UA: 1 mg/dL (ref 0.0–1.0)

## 2010-07-07 LAB — DIFFERENTIAL
Basophils Absolute: 0 10*3/uL (ref 0.0–0.1)
Basophils Relative: 0 % (ref 0–1)
Lymphocytes Relative: 8 % — ABNORMAL LOW (ref 12–46)
Monocytes Absolute: 0.3 10*3/uL (ref 0.1–1.0)
Neutro Abs: 10.7 10*3/uL — ABNORMAL HIGH (ref 1.7–7.7)
Neutrophils Relative %: 89 % — ABNORMAL HIGH (ref 43–77)

## 2010-07-07 LAB — COMPREHENSIVE METABOLIC PANEL
Alkaline Phosphatase: 64 U/L (ref 39–117)
BUN: 18 mg/dL (ref 6–23)
CO2: 26 mEq/L (ref 19–32)
Chloride: 103 mEq/L (ref 96–112)
Creatinine, Ser: 0.83 mg/dL (ref 0.4–1.5)
GFR calc non Af Amer: >60 mL/min (ref >60)
Glucose, Bld: 137 mg/dL — ABNORMAL HIGH (ref 70–99)
Potassium: 4.9 mEq/L (ref 3.5–5.1)
Total Bilirubin: 1.4 mg/dL — ABNORMAL HIGH (ref 0.3–1.2)

## 2010-07-07 LAB — POCT CARDIAC MARKERS
CKMB, poc: 1.4 ng/mL (ref 1.0–8.0)
Myoglobin, poc: 46.2 ng/mL (ref 12–200)
Myoglobin, poc: 69.7 ng/mL (ref 12–200)
Troponin i, poc: <0.05 ng/mL (ref 0.00–0.09)

## 2010-08-28 ENCOUNTER — Encounter: Payer: Self-pay | Admitting: Internal Medicine

## 2010-08-31 ENCOUNTER — Encounter: Payer: Self-pay | Admitting: Internal Medicine

## 2010-08-31 ENCOUNTER — Ambulatory Visit (INDEPENDENT_AMBULATORY_CARE_PROVIDER_SITE_OTHER): Payer: Medicare Other | Admitting: Internal Medicine

## 2010-08-31 VITALS — BP 150/70 | HR 55 | Temp 98.1°F | Ht 67.0 in | Wt 160.0 lb

## 2010-08-31 DIAGNOSIS — G20 Parkinson's disease: Secondary | ICD-10-CM

## 2010-08-31 DIAGNOSIS — F068 Other specified mental disorders due to known physiological condition: Secondary | ICD-10-CM

## 2010-08-31 DIAGNOSIS — K219 Gastro-esophageal reflux disease without esophagitis: Secondary | ICD-10-CM

## 2010-08-31 DIAGNOSIS — I10 Essential (primary) hypertension: Secondary | ICD-10-CM

## 2010-08-31 DIAGNOSIS — M1990 Unspecified osteoarthritis, unspecified site: Secondary | ICD-10-CM

## 2010-08-31 DIAGNOSIS — I2510 Atherosclerotic heart disease of native coronary artery without angina pectoris: Secondary | ICD-10-CM

## 2010-08-31 DIAGNOSIS — G20A1 Parkinson's disease without dyskinesia, without mention of fluctuations: Secondary | ICD-10-CM

## 2010-08-31 MED ORDER — BISOPROLOL FUMARATE 5 MG PO TABS: 5.0000 mg | ORAL_TABLET | Freq: Every day | ORAL | Status: DC

## 2010-08-31 NOTE — Progress Notes (Signed)
Subjective:    Patient ID: Ian [name], male    DOB: 1928-10-02, 77 y.o.   MRN: [id number]  HPI Saw ENT for nasal drainage Given nasal steroid spray (?)---helps briefly but causes him to feel congested later Sporadically using  Heart has been okay Recent eval by Dr Clarene Duke Has ?renal angiogram (?by MRI) to evaluate patency of renal arteries No chest pain Gets DOE with 10-15 minutes of strenuous work---feels it has progressed some  Parkinson's still progressing Seeing neurologist in a couple of weeks Not many other options Still not interested in DBS  Stomach has been quiet Recent letter from Dr Hessie Diener about colonoscopy Last EGD several years ago---not clear he was going to repeat  Current outpatient prescriptions:aspirin 325 MG tablet, Take 325 mg by mouth daily.  , Disp: , Rfl: ;  bisoprolol (ZEBETA) 5 MG tablet, Take 1 tablet (5 mg total) by mouth daily., Disp: 90 tablet, Rfl: 3;  carbidopa-levodopa (SINEMET CR) 25-100 MG per tablet, Take 5 tablets by mouth daily.  , Disp: , Rfl: ;  pantoprazole (PROTONIX) 40 MG tablet, Take 40 mg by mouth daily.  , Disp: , Rfl:  rOPINIRole (REQUIP) 0.25 MG tablet, Take 0.25 mg by mouth 3 (three) times daily.  , Disp: , Rfl: ;  DISCONTD: bisoprolol (ZEBETA) 5 MG tablet, Take 5 mg by mouth daily.  , Disp: , Rfl:   Past Medical History  Diagnosis Date  . CAD (coronary artery disease)   . Allergy   . Diverticulitis   . GERD (gastroesophageal reflux disease)   . Hypertension   . Osteoarthritis   . Osteoporosis   . MALT lymphoma   . Parkinson's disease   . ASCVD (arteriosclerotic cardiovascular disease) 06/08    TIA  . Hyperlipidemia   . ED (erectile dysfunction)     Past Surgical History  Procedure Date  . Coronary artery bypass graft     Zenaida Niece trigt ) aortic valve replacement 2001  . Esophagogastroduodenoscopy     multiple, Colon/ EGD benign 11/2004  . Pilonidal cyst / sinus excision 1953  . Inguinal hernia repair    LIH 1997Llap. bilateral hernias 1998  . Carotid stent     8/09  Stent by Dr Rosie Fate started and aggrenox stopped  . Cataract extraction 06/09    left  . Laparoscopic cholecystectomy 07/11    Dr.Rosenbower    Family History  Problem Relation Age of Onset  . Heart disease Brother     History   Social History  . Marital Status: Married    Spouse Name: N/A    Number of Children: 3  . Years of Education: N/A   Occupational History  . retired- [employer]   . part-time sub/courier for schools- now only rarely    Social History Main Topics  . Smoking status: Never Smoker   . Smokeless tobacco: Not on file  . Alcohol Use: Yes     occasional  . Drug Use: Not on file  . Sexually Active: Not on file   Other Topics Concern  . Not on file   Social History Narrative  . No narrative on file   Review of Systems NO rectal bleeding Bowels are okay Sleeps okay Still frustrated and depressed at times Still enjoys eating, etc Wife doesn't feel he has persistent depression Goes out to eat, church, some socialization. Enjoys kids and grandkids    Objective:   Physical Exam  Constitutional: He appears well-developed and well-nourished. No distress.  Neck: Normal range of motion. Neck supple. No thyromegaly present.  Cardiovascular: Normal rate.  Exam reveals no gallop.   Murmur heard.      Gr 3/6 aortic systolic murmur freq extra beats  Pulmonary/Chest: Effort normal and breath sounds normal. No respiratory distress. He has no wheezes. He has no rales.  Abdominal: Soft. There is no tenderness.  Musculoskeletal: Normal range of motion. He exhibits no edema and no tenderness.  Lymphadenopathy:    He has no cervical adenopathy.  Neurological:       Moderate resting tremor in hands Mild bradykinesia and stiffness  Psychiatric: He has a normal mood and affect. His behavior is normal. Judgment and thought content normal.          Assessment & Plan:

## 2010-09-01 NOTE — Consult Note (Signed)
NAMEREVANTH, NEIDIG NO.:  [id number]   MEDICAL RECORD NO.:  [id number]          PATIENT TYPE:  INP   LOCATION:  3023                         FACILITY:  [facility]   PHYSICIAN:  Gustavus Messing. Orlin Hilding, M.D.DATE OF BIRTH:  07-19-1928   DATE OF CONSULTATION:  10/05/2006  DATE OF DISCHARGE:                                 CONSULTATION   NEUROLOGY CONSULT NOTE:   CHIEF COMPLAINT/REASON FOR CONSULTATION:  Transient ischemic attack with  speech arrest according to the patient.  The day before yesterday, he  had an episode of speech arrest lasting about 6 minutes.  According to  the description of it, it sounded more like an aphasia than a dysarthria  where he had trouble thinking of the words he wants to say.  It was  accompanied by some nonspecific blurred vision and mild headache.  He  told me there was no weakness, numbness or incoordination associated  with.  However, the admission note says that the actual chief complaint  was right arm weakness, which is quite different from what I was told  and that he was having a stuttering course over the last few days, which  was also accompanied by some difficulty speaking.  He had a CT in the  emergency room, on the 17th of June, which was late at night when he  came in, which was unremarkable, but his blood pressure was a little bit  elevated at 152/94.  At baseline, he is a patient of Dr. Pearlean Brownie for  Parkinson disease, possibly with some dementia, although his notes do  not allude to that.  He had been on Sinemet, but stopped taking it  because he did not like it because of the side effects.  Patient is at  baseline neurologically at this time with some hesitant speech, a little  paranoia, mild confusion perhaps and his Parkinson symptoms, which are  chiefly a right-sided tremor.   REVIEW OF SYSTEMS:  Negative for any current headache, vision problem,  dysphagia, dysarthria, chest pain, palpitations, shortness of breath,  wheezing, coughing, abdominal pain, hematuria, dysuria, constipation,  diarrhea, weakness on one side or the other, pain anywhere in  particular.  He does have tremors; otherwise, 12 system review is  negative.   PAST MEDICAL HISTORY:  Significant for:  1. A recently diagnosed Parkinson disease.  2. Coronary artery disease.  3. History of coronary artery bypass graft, aortic valve repair with a      bioprosthetic.  4. Hypertension.  5. Hyperlipidemia.  6. Question some early dementia.   He had previously been taking Sinemet, aspirin, bisoprolol, pravastatin,  hydrochlorothiazide and Prevacid.  He is now down just to the  pravastatin, hydrochlorothiazide and aspirin.   ALLERGIES:  PENICILLIN.   SOCIAL HISTORY:  No tobacco, alcohol or recreational drugs.  He lives  with his wife.   FAMILY HISTORY:  Noncontributory.   OBJECTIVE:  On exam:  VITAL SIGNS:  Temperature is 97.9.  Pulse 64.  Respirations 20.  BP  137/73, 97% saturation.  HEENT:  Head is normocephalic, atraumatic.  NECK:  Supple without bruits.  HEART:  Regular rate and rhythm.  LUNGS:  Clear to auscultation.  EXTREMITIES:  Without significant edema.  NEUROLOGIC EXAM:  Mental status:  Awake, alert, oriented to age, month  and place.  Names objects and follows commands.  Registers 3/3 items  with some paraphasic errors, for example I told him a flag and tree and  he repeated trag and flee.  He recalls 2/3 words.  He spells world  forward correctly, backwards he is spells dlrlw, substituting an L for  the O.  Cranial nerves:  The pupils are equal and reactive.  Visual  fields are full.  Extraocular movements are intact.  Facial sensation:  Normal facial.  Motor activity is normal.  Hearing is intact.  Palate is  symmetric and tongue is midline.  There is some vague hesitancy to his  speech, and that is baseline.  He has some positive Myerson's.  On motor  exam, he has got some mask neophasia, his right greater than  left upper  extremity and facial tremor.  Some cogwheeling rigidity in the right  greater than left upper extremity, but a pretty good gait.  He has no  drift in the upper or lower extremities.  There is no ataxia on finger-  to-nose or heel-to-shin.  Deep tendon reflexes are diminished with  downgoing toes.  Coordination is, otherwise, normal as noted.  Sensation  is normal.   CT of the head is negative, except for some atrophy and nothing acute.  Carotid Dopplers show 40% to 60% left ICA stenosis and a question of  distal vertebral stenosis.  Echocardiogram is negative for a clot.   IMPRESSION:  Probable transient ischemic attack, although the details of  the event are unclear to me as I obtain a very different history from  the admission history, so I cannot really localize this.  The workup is  not really complete, however.  He needs an MRI scan of the brain with an  MR angiogram of the head and neck.  However, carotid Doppler is negative  for any surgical disease and 2D ECHO was negative for a significant  clot, anything that we might treat with Coumadin.   RECOMMENDATIONS:  Although I would recommend finishing the workup with  the MRI of the brain, MR angiogram of head and neck, and agree that he  ought to remain here until that is done, if he insists on going home, it  can be done as outpatient.  I agree with Aggrenox since the event  occurred while taking the aspirin.  He should follow up with Dr. Pearlean Brownie  in the office soon regarding this transient ischemic attack, question of  dementia and Parkinson disease.  He was not scheduled to come back until  November, but I think he should come back sooner.      Catherine A. Orlin Hilding, M.D.  Electronically Signed     CAW/MEDQ  D:  10/05/2006  T:  10/06/2006  Job:  [id number]   cc:   Delia Heady, M.D.

## 2010-09-01 NOTE — Cardiovascular Report (Signed)
Ian, [name] NO.:  [id number]   MEDICAL RECORD NO.:  [id number]          PATIENT TYPE:  OIB   LOCATION:  6527                         FACILITY:  [facility]   PHYSICIAN:  Vonna Kotyk R. Jacinto Halim, MD       DATE OF BIRTH:  01/25/1929   DATE OF PROCEDURE:  12/05/2007  DATE OF DISCHARGE:                            CARDIAC CATHETERIZATION   PROCEDURES PERFORMED:  1. Selective right and left coronary arteriography.  2. Ascending aortogram.  3. Abdominal aortogram.  4. Selective right femoral arteriography.  5. Percutaneous transluminal coronary angioplasty and stenting of the      mid circumflex coronary artery.   INDICATIONS:  Ian [name] is a 77 year old gentleman with a known  coronary artery disease.  He had undergone coronary bypass grafting for  1-vessel disease in 2001 by LIMA to LAD and aortic valve replacement  with a bioprosthetic valve.  He had recently undergone stress Myoview  and this had revealed anterior wall ischemia.  He has sedentary  lifestyle, but because this was clearly new and anterior wall ischemia  was new, he was referred for cardiac catheterization to evaluate his  coronary anatomy.  The ascending aortogram was performed because of  evaluation for ascending aortic aneurysm and aortic ectasia, as I had  difficulty in engaging the guide and abdominal aortogram for abdominal  atherosclerosis and renal artery stenosis.   HEMODYNAMIC DATA:  The aortic pressure was 109/54 with a mean of 78  mmHg.   Right coronary artery:  The right coronary artery was a large-caliber  dominant vessel.  It has got mild diffuse luminal irregularity  constituting 20-30% stenosis, gave origin to a moderate-sized PDA and a  large PLA.   Left main coronary artery:  The left main coronary artery has a tortuous  origin with an acute bend.  The left main was widely patent.   Circumflex:  Circumflex was a large-caliber vessel.  The ostium of the  circumflex has an acute  angle take off with a 40% stenosis.  The mid  segment of the circumflex coronary artery had a 90-95% stenoses.  This  is a large-caliber vessel.   LAD:  LAD is a large-caliber vessel.  Gives origin to a small diagonal  one and the LAD shows mild diffuse luminal irregularity.  In the mid  segment, has a 70% stenosis; however, the distal LAD is supplied by LIMA  to LAD which is patent.   The diagonal which is very small has an ostial 90% stenoses.   LIMA to LAD:  LIMA to LAD is widely patent.   The ascending aortogram:  The ascending aortogram revealed moderate  aortic root dilatation.  There was no evidence of aortic regurgitation.   Abdominal aortogram:  Abdominal aortogram revealed presence of 2 renal  arteries, 1 on either side.  The right renal artery appeared to have  high-grade stenosis.   The aortoiliac bifurcation was widely patent.  There was no evidence  abdominal aortic aneurysm.   Selective right renal arteriography:  Selective right renal  arteriography revealed a high-grade 90% right renal artery stenosis  followed by poststenotic ectasia.   INTERVENTION DATA:  Successful PTCA and stenting of the mid circumflex  artery with the implantation of a 4.0 x 28-mm Multi-Link Vision stent  which was deployed at 10 atmospheric pressure.  This stent was post out  at 4.0 x 15 mm DuraStar at 14 atmospheric pressure x2.  Overall, the  stenosis was reduced from 90% to 0% with brisk TIMI-3 to TIMI-3 flow  maintained at the end of the procedure.  The distal vessels clearly  enlarged after the balloon angioplasty and stenting.   RECOMMENDATIONS:  Patient will be discharged home in the morning if he  remains stable.  He will need to continue aspirin indefinitely and he  will need Plavix for at least a period of 6 weeks.   Total of 200 mL of contrast was utilized for diagnostic angiography.   TECHNIQUE OF THE PROCEDURE:  Under usual sterile precautions, using a 6-  French right  femoral arterial access, a 6-French multipurpose B#2  catheter was advanced easily and then the right coronary artery  selectively engaged angiography was performed.  Ascending aortogram was  also performed in the LAO projection.  Then, the catheter was pulled out  of the body.   A 6-French Judkins left 5 diagnostic catheter was utilized to engage the  left main coronary artery and angiography was performed.  Then, a 5-  [place] LIMA catheter was utilized to engage the left intramammary artery  and angiography was performed.  Before pulling out the B#2 catheter,  abdominal aortogram and selective arteriography was also performed.   TECHNICAL EVALUATION:  I upsized the 6-French sheath to a 7-French  sheath.  Then, using a 7-French FL-5 guide, I was able to engage the  left main coronary artery.  I probably needed a FL-6 guide because of  acute angulation.  Intervention was slightly difficult; however, was  feasible.   Using Angiomax for anticoagulation and using ATW marker guidewire, I was  able to advance into the circumflex coronary artery which had an acute  bend.  A 3.0 x 15-mm Apex balloon was utilized for predilatation  followed by stenting with a 4.0 x 28-mm Vision stent.  I had difficulty  in advancing the Vision stent with almost complete collapse of the wire.  Hence a second wire, BMW guidewire was utilized and I placed this into  the left anterior descending artery and gently advanced the guide  catheter for minimal deep throating into the left main coronary artery.  At this time, I was able to successfully pass the stent into the  circumflex coronary artery, and after positioning the stent, the stent  was deployed at 10 atmospheric pressure and postdilated with a 4.0 x 15-  mm Dura Star at 14 atmospheric pressure x2 with excellent results.  Postangioplasty intracoronary nitroglycerin was administered.  Angiography was repeated.  Excellent results were noted.  Guidewire was   withdrawn.  Angiography was repeated.  Guide catheter was disengaged and  pulled out of the body.   Right femoral arteriography was performed through the arterial access  sheath and the access closed with StarClose with excellent hemostasis.  The patient tolerated the procedure.  There was no immediate  complication noted.      Cristy Hilts. Jacinto Halim, MD  Electronically Signed     JRG/MEDQ  D:  12/05/2007  T:  12/06/2007  Job:  [id number]   cc:   Karie Schwalbe, MD  Thereasa Solo. Little, M.D.

## 2010-09-01 NOTE — H&P (Signed)
Ian [name], SILVIO NO.:  [id number]   MEDICAL RECORD NO.:  [id number]          PATIENT TYPE:  EMS   LOCATION:  MAJO                         FACILITY:  [facility]   PHYSICIAN:  Hollice Espy, M.D.DATE OF BIRTH:  Mar 07, 1929   DATE OF ADMISSION:  10/04/2006  DATE OF DISCHARGE:                              HISTORY & PHYSICAL   PRIMARY CARE PHYSICIAN:  Tillman Abide, M.D.   NEUROLOGIST:  Delia Heady, M.D.   CHIEF COMPLAINT:  Episode of right arm weakness.   HISTORY OF PRESENT ILLNESS:  The patient is a 77 year old white male  with a past medical history of recently diagnosed Parkinson's disease  six months ago as well as coronary artery disease with a history of  aortic valve repair and coronary artery bypass graft who has been in  good health but then last night started having episodes of intermittent  loss of vision.  These episodes were lasting only a few minutes in  nature but he had several episodes.  He continued to monitor his  symptoms and then again today he started having problems with right arm  numbness and weakness as well as difficulty with speaking.  Both of  these symptoms improved by the time that he made it to the emergency  room.   In the emergency room he had a CT scan of his head which was  unremarkable.  His laboratory work was equally unremarkable.  His blood  pressure was noted to be 162/94.  It was felt that the patient was  having transient ischemic attack symptoms and needed to come in for  further evaluation and treatment.   The patient is currently doing well.  He denies any headaches, vision  changes, dysphagia, chest pain, palpitations, shortness of breath,  wheezing, coughing, abdominal pain, hematuria, dysuria, constipation,  diarrhea, focal extremity numbness, weakness, or pain.  He does have  some right upper extremity tremors secondary to his Parkinson's disease.   REVIEW OF SYSTEMS:  His review of systems is otherwise  negative.   PAST MEDICAL HISTORY:  The patient's past medical history includes  recently diagnosed Parkinson's disease, coronary artery disease with a  history of coronary artery bypass graft, aortic valve repair,  hypertension, and hyperlipidemia.   MEDICATIONS:  The patient is on aspirin, hydrochlorothiazide, and  pravastatin.  Please note that the patient is not on any medications for  Parkinson's disease.  He has declined to take these as he does not like  the side effects from the medications.   ALLERGIES:  He is allergic to penicillin.   SOCIAL HISTORY:  He denies any tobacco, alcohol, or drug.  He lives at  home with his wife.   FAMILY HISTORY:  The family history is noncontributory.   PHYSICAL EXAMINATION:  VITAL SIGNS:  The patient's vital signs on  admission showed a temperature of 98.9, heart rate of 73, blood pressure  of 162/94, respirations of 17, O2 saturation of 91% on room air.  GENERAL:  The patient is alert and oriented times three in no apparent  distress.  HEENT:  Normocephalic, atraumatic.  The mucous membranes are moist.  He  has no carotid bruits.  Cranial nerves II through XII are intact.  HEART:  Regular rate and rhythm, S1 and S2.  LUNGS:  The lungs are clear to auscultation bilaterally.  ABDOMEN:  Soft, nontender, nondistended, positive bowel sounds.  EXTREMITIES:  The extremities showed no clubbing, cyanosis, or edema.  MUSCULOSKELETAL:  Examination is normal.  Grip flexion extension of  upper and lower extremities is all 5/5.   LABORATORY STUDIES:  CT scan of the head is as per HPI and normal.  White count is 9.5, H and H of 14.6 and 43.1, MCV is 91, platelet count  of 348, no shift.  Coags are unremarkable.  Urinalysis unremarkable.   The patient's creatinine is 1.1.  The rest of his admit laboratory  studies are pending.   ASSESSMENT AND PLAN:  1. Transient ischemic attack symptoms in a patient already on aspirin.      Will plan to check a 2-D  echocardiogram, bilateral carotid      Dopplers, as well as laboratory work including fasting lipid      profile and homocystine level.  The patient already sees Dr. Pearlean Brownie      for his Parkinson's disease and will ask them again for a consult.      I am going to hold off on getting an MRI and MRA on this patient as      according to the patient's family he had one done six months ago      and we need to review the results rather than subjecting the      patient to an unnecessary and expensive test again.  2. Coronary artery disease:  Will continue the aspirin and      pravastatin, likely will need to change the patient's aspirin over      to Aggrenox.  3. Hypertension:  He is on hydrochlorothiazide.  Given his slightly      elevated blood pressures here will need to look into better and      aggressive blood pressure control.  4. Parkinson's disease:  Stable, patient does not want to be on      Sinemet and will respect his wishes.      Hollice Espy, M.D.  Electronically Signed     SKK/MEDQ  D:  10/05/2006  T:  10/05/2006  Job:  [id number]   cc:   Hollice Espy, M.D.  Valerie A. Felicity Coyer, MD  Karie Schwalbe, MD  Delia Heady, M.D.

## 2010-09-01 NOTE — Discharge Summary (Signed)
Ian [name], Ian [name] NO.:  [id number]   MEDICAL RECORD NO.:  [id number]          PATIENT TYPE:  INP   LOCATION:  3023                         FACILITY:  [facility]   PHYSICIAN:  Valerie A. Felicity Coyer, MDDATE OF BIRTH:  27-Jun-1928   DATE OF ADMISSION:  10/05/2006  DATE OF DISCHARGE:  10/05/2006                               DISCHARGE SUMMARY   DISCHARGE DIAGNOSES:  1. Transient ischemic attack.  2. Parkinson's disease.  3. Dementia.  4. Coronary artery disease with history of coronary artery bypass      grafting and aortic valve repair.  5. Hyperlipidemia.  6. Hypertension.   HISTORY OF PRESENT ILLNESS:  Ian [name] is a 77 year old male with a  history of Parkinson's disease who is followed by Dr. Delia Heady of  neurology.  He apparently started having episodes of intermittent vision  loss which were accompanied by right arm weakness and numbness, as well  as an approximately five-minute period of expressive aphasia.  He  underwent a CT scan of the head performed in the emergency department  which was negative.  The patient was admitted for further evaluation and  treatment of probable transient ischemic attack.   COURSE OF HOSPITALIZATION:  TIA.  The patient's symptoms have resolved.  He is currently at his baseline.  The family and patient noted that he  has had some progressive decline over the last year in his memory  including some difficulty word-finding, however he reports that he is  currently at his baseline.  He has a chronic right upper extremity  tremor and is currently not taking any medicine for Parkinson's disease  which he apparently did not tolerate.  At this time, we will change the  patient's aspirin to Aggrenox.  We have also requested a neurology  evaluation prior to discharge.  Of note, the patient had an MRI  performed several months ago through [facility]; we will defer  to neurology team if they wish to repeat the study and/or  perform MRA.  In addition, could consider addition of Aricept due to recent issues  with memory.  The patient was noted to have some carotid stenosis on  carotid duplex; preliminary report notes left 40 to 60% internal carotid  artery stenosis, this will need continued outpatient monitoring.  We are  currently awaiting final results of 2-D echo which was performed this  morning.   MEDICATIONS AT TIME OF DISCHARGE:  1. Aggrenox one cap p.o. b.i.d. in place of aspirin.  2. HCTZ 25 mg p.o. daily.  3. Pravastatin 40 mg p.o. daily at bedtime.   PERTINENT LABORATORIES AT TIME OF DISCHARGE:  Hemoglobin 14.6,  hematocrit 43.1, BUN 15, creatinine 1.1.  Cholesterol 166, HDL 44, LDL  105.   DISPOSITION:  Patient will be discharged to home.   FOLLOWUP:  The patient is instructed to follow up with Dr. Tillman Abide in one week and contact the office for an appointment.  He is  also instructed to follow up with Dr. Pearlean Brownie as scheduled and to return  to the emergency room if he develops difficulty speaking  or weakness.      Ian Craze, NP      Ian [name]. Felicity Coyer, MD  Electronically Signed    MO/MEDQ  D:  10/05/2006  T:  10/05/2006  Job:  [id number]   cc:   Karie Schwalbe, MD

## 2010-09-04 NOTE — Discharge Summary (Signed)
[place]. [facility]  Patient:    Ian [name], Ian [name]                      MRN: [id number] Adm. Date:  [id number] Disc. Date: [id number] Attending:  [name] Dictator:   Adair Patter, P.A.-C CC:         Ian [name], M.D.  Heather Roberts, M.D.   Discharge Summary  ADMISSION DIAGNOSES: 1. Aortic insufficiency. 2. Coronary artery disease.  DISCHARGE DIAGNOSES: 1. Status post aortic valve repair. 2. Status post coronary artery bypass graft x 1. 3. Coronary artery disease.  ADMISSION HISTORY AND PHYSICAL:  This is a 77 year old white man with an 42-month history of aortic insufficiency diagnosed by echo and 3-6 months of burning chest pain and dyspnea on exertion.  The patient was seen in Dr. Darrol Poke office in November 2001, where he was found to have nonspecific ST changes with exercise on his EKG.  Because of this, the patient underwent a cardiac catheterization which showed 3+ aortic insufficiency and 80% left anterior descending occlusion.  PHYSICAL EXAMINATION:  VITAL SIGNS:  On physical examination, he was found to have a blood pressure of 170/60, pulse of 74 and regular.  HEENT:  The HEENT exam was within normal limits. LUNGS:  His lungs were clear to auscultation bilaterally.  CARDIAC:  The cardiac exam revealed a 3/6 systolic murmur, however, regular rate and rhythm.  ABDOMEN:  The abdomen was soft, nontender, nondistended, with positive bowel sounds in all four quadrants.  EXTREMITIES:  Revealed no clubbing, cyanosis, or edema.  NEUROLOGIC:  Neurologic exam was grossly intact.  HOSPITAL COURSE:  Because of the patients coronary artery disease and aortic insufficiency, he was referred to Dr. Donata Clay for surgical intervention.  On March 03, 2000, the patient underwent a coronary artery bypass graft x 1 with a left internal mammary artery anastomosis to the left anterior descending artery and aortic valve repair with a  #25 pericardial valve.  The procedure was performed by Dr. Donata Clay under general endotracheal anesthesia and cardiopulmonary bypass.  No complications were noted during the procedure and the patient was transferred to the SICU on stable condition.  He was extubated later on his operative day.  On postoperative day #1, the patient was found to be in sinus tachycardia, however, stable.  His postoperative hematocrit was 30 and creatinine was 0.9.  His mediastinum tube was _____. The patient was in normal sinus rhythm on postoperative day #2 with hemoglobin and hematocrit of 10 and 29.  The BUN and creatinine were 14 and 0.8.  All of his laboratory values remained stable throughout the remainder of his hospital stay.  The patients remaining chest tube was discontinued and he was transferred to the step-down unit.  Ambulation was begun.  The patient was still requiring 5 L by nasal cannula at the time.  Coumadin was started on this day.  The patient remained stable on postoperative day #3, still requiring a small amount of oxygen at 2 L by nasal cannula.  He was having trouble with concentration.  On postoperative day #4, the patient reported small bowel movement.  He was doing well, not requiring any oxygen at rest. However, his oxygen saturations decreased with exercise.  Because of this, incentive spirometry and pulmonary toilette were progressively pursued.  On postoperative day #5, the patient was doing well, not requiring any oxygen therapy.  He continued to take Coumadin and his INR was 2.0  at this time.  He still remains slightly constipated.  He had hyperactive bowel sounds and a mildly distended belly.  However, no nausea or vomiting.  It was an uneventful hospital course.  Discharge planning was made on postoperative day #6, March 09, 2000.  DISCHARGE MEDICATIONS: 1. Darvocet-N 100 one to two tablets q.4-6h. as needed for pain. 2. Lopressor 50 mg 1/2 tablet q.12h. 3. Prevacid  30 mg 1 tablet twice daily. 4. Coumadin which will be dosed according to the patients INR.  DISCHARGE ACTIVITY:  The patient was told to avoid driving, strenuous activity, and no lifting over 10 pounds.  DISCHARGE DIET:  Low fat, low salt.  DISCHARGE WOUND CARE:  The patient was told he may shower and clean his incisions with mild soap and water.  DISCHARGE DISPOSITION:  Home.  DISCHARGE FOLLOW-UP:  The patient was told to call his cardiologist, Dr. Clarene Duke, for follow-up appointment in two weeks.  He is also instructed to tell Dr. Darrol Poke office he needs to have his INR checked in 2-3 days which will further dictated his Coumadin therapy.  The patient was also instructed to follow up with Dr. Donata Clay on Friday, December 14, at 9:30 in the morning.  He was also told to go to the [facility] approximately one hour before his appointment with Dr. Donata Clay, at which time, he will have the chest x-ray taken.  He was told he should bring his chest x-ray to Dr. Vincent Gros office. DD:  03/08/00 TD:  03/09/00 Job: [id number]

## 2010-09-04 NOTE — Cardiovascular Report (Signed)
[place]. [facility]  Patient:    Ian [name], Ian [name]                      MRN: [id number] Proc. Date: 02/29/00 Adm. Date:  [id number] Attending:  Loreli Dollar CC:         Heather Roberts, M.D.  Cath Lab  CVTS   Cardiac Catheterization  INDICATIONS:  Ian [name] is a 77 year old male who has mild AS and mild to moderate AI by echo.  He had normal LV function.  He presented for a routine treadmill test on February 23, 2000, because of exertional chest pain.  The test was abnormal with nonspecific ST segment changes, three beat ventricular tachycardia, nonsustained SVT, and this was also associated with chest discomfort.  Because of this, he is brought in for cardiac catheterization.  PROCEDURE: 1. Left heart catheterization. 2. Selective right and left coronary arteriography. 3. Ventriculography. 4. Right heart catheterization. 5. Cardiac output by thermodilution. 6. Simultaneous O2 saturations from the AO and pulmonary artery.  COMPLICATIONS:  None.  EQUIPMENT:  6 French Judkins 5 cm curved catheters were used to cannulate the coronary arteries.  8 French angled pigtail catheter and a glide wire was used to cross the aortic valve, and an 8 [place] Swan-Ganz catheter was used.  DESCRIPTION OF PROCEDURE:  The patient was prepped and draped in the usual sterile fashion exposing the right groin.  Following local anesthetic with 1% Xylocaine, the Seldinger technique was employed and a 7 [place] introducer sheath was placed into the right femoral artery.  An 8 introducer sheath in the right femoral vein.  A Swan-Ganz catheter was then advanced through its normal route into the pulmonary artery.  Hemodynamic monitoring undertaken throughout each station.  Cardiac output by thermodilution was then performed. The pigtail catheter was then advanced into the ascending aorta and simultaneous oxygen saturations were obtained from the PA and AO.  With the use  of the glide wire, the aortic valve was crossed and the pigtail catheter placed into the apex of the left ventricle.  Ventriculography in the RAO projection and aortic root injection in the LAO projection was performed. Following this, selective right and left coronary arteriography was performed. 5 cm curved catheters were required to cannulate the ostiums of the coronaries.  COMPLICATIONS:  None.  RESULTS:  HEMODYNAMIC MONITORING:  Central aortic pressure 179/62, left ventricular pressure 189/26.  There was a 0.2 mm aortic valve gradient noted at the time of pullback.  Aortic valve area was 6 sq cm.  Cardiac output by thermodilution was 5.4 liters per minute for a cardiac index of 2.67.  Right atrial pressure 8, right ventricular pressure 32/9, pulmonary artery pressure 30/16 with a mean of 20, wedge of 13.  VENTRICULOGRAPHY:  Ventriculography in the RAO projection using 25 cc of contrast revealed a slightly dilated left ventricle with normal left ventricular systolic function.  Ejection fraction was greater than 60%.  The end diastolic pressure was 26.  AORTIC ROOT INJECTION:  The aortic root was dilated.  There was moderate to severe aortic insufficiency.  CORONARY ARTERIOGRAPHY:  There was calcification noted on fluoroscopy in the aortic root and in the proximal aortic root.  Left main normal.  LAD.  The LAD was a small diameter vessel about 2.5 mm.  It extended down and crossed the apex of the heart and gave rise to a very high first diagonal.  In the proximal portion of the vessel, but after  the first diagonal, was an area of 60% narrowing.  The midportion of the vessel had a long 25 to 30 mm 70 to 80% area of diffuse narrowing.  The entire vessel was moderately diseased.  Circumflex.  The circumflex was a very large vessel at about 3.5 to 4 mm in diameter and it gave rise to a very large OM which the same size.  There was mild proximal and distal irregularities in  the OM.  RCA.  The RCA had mild proximal and distal disease.  This too was a very large vessel giving rise to the posterior descending artery and three posterolateral branches.   CONCLUSION: 1. Minimal aortic stenosis. 2. Moderate to severe aortic insufficiency. 3. Dilatation of the aortic root. 4. Severe disease involving the LAD with diffuse midobstruction with mild    irregularities in the RCA and LAD. 5. Slightly elevated pulmonary artery pressures. 6. Well preserved left ventricular systolic function.  At this point it appears that the single vessel coronary artery disease is so severe that percutaneous intervention would truly be high risk and probably unsuccessful.  In addition to this, he needs aortic valve replacement.  I have referred him to CVTS for consideration for the above.  I plan to start him on Toprol 25 mg a day in addition to an aspirin and will reevaluate him in the office tomorrow.  Hopefully he can have surgery done in the upcoming 48 to 72 hours. DD:  02/29/00 TD:  02/29/00 Job: [id number]

## 2010-09-04 NOTE — Op Note (Signed)
Ian [name], Ian [name]                        ACCOUNT NO.:  [id number]   MEDICAL RECORD NO.:  [id number]                   PATIENT TYPE:  AMB   LOCATION:  ENDO                                 FACILITY:  [facility]   PHYSICIAN:  Petra Kuba, M.D.                 DATE OF BIRTH:  1929-04-07   DATE OF PROCEDURE:  11/15/2001  DATE OF DISCHARGE:                                 OPERATIVE REPORT   PROCEDURE PERFORMED:  Esophagogastroduodenoscopy with biopsy.   ENDOSCOPIST:  Petra Kuba, M.D.   INDICATIONS FOR PROCEDURE:  Patient with a history of  MALT due for repeat  screening.  Consent was signed after the risks and benefits were thoroughly  discussed multiple times in the past.   MEDICINES USED:  Demerol 50 mg, Versed 4 mg.   DESCRIPTION OF PROCEDURE:  The video endoscope was inserted by direct  vision.  The proximal and midesophagus were normal.  In the distal esophagus  was a tiny to small  hiatal hernia.  The scope passed into the stomach and  advanced to the antrum where some minimal antritis and decreased erythema  was seen.  The scope passed through a normal pylorus into a normal duodenal  bulb and around the C-loop to a normal second portion of the duodenum.  The  scope was withdrawn back to the bulb and a good look there ruled out  abnormalities in that location.  The scope was withdrawn back to the stomach  and retroflexed.  High in the cardia, a  hiatal hernia was confirmed.  The  fundus, angularis, lesser and greater curves were all normal.  The scope was  straightened and straight visualization of the stomach confirmed the mild  antritis and ruled out any other abnormalities.  Extensive biopsies of the  antrum were obtained.  Air was suctioned.  The scope slowly withdrawn.  Again, a good look at the esophagus was normal.  Scope was removed.  The  patient tolerated the procedure well.  There were no obvious immediate  complication.   ENDOSCOPIC DIAGNOSIS:  1. Tiny to  small  hiatal hernia.  2. Antritis improved, status post biopsy.  3. Otherwise within normal limits esophagogastroduodenoscopy.   PLAN:  Continue pump inhibitors.  Await pathology and determine future MALT  screening.  I have asked them to see Dr. Orson Aloe for  one last going over  before he changes (and possible consideration of an antidepressant use  versus allowing him to see a new primary care doctor) and see if any other  work-up plans need to be done for his chronic fatigue and multiple somatic  complaints.                                               [name]  E. Magod, M.D.    MEM/MEDQ  D:  11/15/2001  T:  11/18/2001  Job:  [id number]

## 2010-09-04 NOTE — H&P (Signed)
[place]. [facility]  Patient:    Ian [name], Ian [name]                      MRN: [id number] Adm. Date:  [id number] Disc. Date: [id number] Attending:  Loreli Dollar Dictator:   Weyman Pedro, P.A. CC:         Heather Roberts, M.D.  Petra Kuba, M.D.  [name], M.D.   History and Physical  DATE OF BIRTH:  07-10-1928.  CHIEF COMPLAINT:  Elective heart catheterization.  HISTORY OF PRESENT ILLNESS:  Patient came in for elective heart catheterization on February 29, 2000 secondary to positive treadmill test. Patient actually had right and left heart catheterizations via right femoral artery and vein performed without difficulty for mild AS, moderate-to-severe AI and angina, with positive treadmill test.  LV slightly dilated with normal systolic function, EF greater than 60%, with EDP of 26.  Aortic root dilated, with moderate-to-severe AI.  Left main normal.  Circumflex:  Large OM, 2.5 to 4.0 mm, mild proximal and distal irregularities in the OM.  LAD:  Small diameter, less than or equal to 2.5 mm; crosses apex.  Mid long area, approximately 30 mm, 70-80% stenosed; proximal 60% stenosed.  Diagonal okay. RCA:  Mild proximal and distal disease.  Large vessel with PDA and three PL branches.  Patient needs atrial valve replacement and single-vessel CABG. Will start Toprol 25 mg p.o. q.d. and ask CVTS to evaluate.  Patient was initially seen in our office, courtesy of Dr. Heather Roberts, because of aortic insufficiency.  As part of his routine physical examination, he had a chest x-ray performed by Dr. Orson Aloe which revealed cardiomegaly. As a result of that, he had an elective echocardiogram performed that showed normal LV size and function.  There was questionable mild LVH.  He had mild-to-moderate AI and minimal AS, with the aortic root being absolutely dilated.  He was completely asymptomatic from a cardiac standpoint.  He had no chest  pain, unusual shortness of breath, PND or orthopnea.  His exercise tolerance was good and he did not have rheumatic fever as a child.  He is unaware of a heart murmur.  He has never had endocarditis of which he is aware.  He also stated he had some type of heart test in 1997 of which we have  no records of at this time.  PAST MEDICAL HISTORY 1. Asymptomatic mild-to-moderate AI and mild AS. 2. Normal LV dimensions except for mild LVH. 3. Slightly dilated aortic root. 4. Recent bout of pneumonia. 5. Hiatal hernia diagnosed in 1996 with recurrent ______ problems; this is    followed by Dr. Vida Rigger. 6. Bilateral hernia repairs. 7. History of pneumonia back in July of 2000.  FAMILY HISTORY:  Brother died of myocardial infarction.  He had a daughter who died of bone cancer and his father had COPD.  SOCIAL HISTORY:  Patient is married and he has three adult children and has a Naval architect.  He smoked for about 10 years but stopped smoking in 1963. Infrequently drinks alcohol.  ALLERGIES:  PENICILLIN and BIAXIN.  Patient is intolerant of ASPIRIN secondary to GI complaints.  CURRENT MEDICATIONS 1. Prevacid 30 mg p.o. q.d. 2. Toprol XL 25 mg p.o. q.d.  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 Ewing Schlein is following him.  Bowel habits are normal.  No peripheral edema.  No claudication.  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  Patient does yard work but no heavy lifting.  Patient has been advised about SBE prophylaxis, exercise and diet.  Patient was to restrain from any heavy straining activities.  Patient denies any PND, orthopnea or dyspnea on exertion.  GI consultant saw patient on February 29, 2000.  As stated, he has a history of MALT lymphoma in February of 2000 (very low-grade H. pylori probable cause), treated only with antibiotics.  Saw Dr. Rose Phi. Ennever for second opinion, with negative followup  EGD x 3, (last one ______ ), to a planned reevaluation for May.  Will continue pump inhibitor (Prevacid or Protonix) while in the hospital.  Will follow up as necessary.  PHYSICAL EXAMINATION  VITAL SIGNS:  Blood pressure 160/70, pulse 89.  LUNGS:  Clear to auscultation bilaterally, without rales, wheezes or rhonchi on forced expiration.  NECK:  No transmitted murmurs into the carotids or bruits.  CARDIAC:  Regular rate and rhythm with a 2/6 systolic murmur and a 3/6 diastolic murmur.  EXTREMITIES:  Distal pulses 2+ bilaterally.  No edema.  ABDOMEN:  Nontender, nondistended and positive bowel sounds in all four quadrants.  No masses.  Liver and spleen nonpalpable.  ASSESSMENT AND PLAN:  Will consult CVTS and plan atrial valve replacement and single-vessel coronary artery bypass graft to the left anterior descending on March 03, 2000 by Dr. Kathlee Nations Trigt III.  Patient was seen and evaluated prior to discharge on February 29, 2000.  Patient will be on a leave of absence from February 29, 2000 till day of surgery on March 03, 2000.  Both cardiology and gastroenterology will follow along with surgeons. DD:  03/01/00 TD:  03/01/00 Job: [id number]

## 2010-09-04 NOTE — Op Note (Signed)
[place]. [facility]  Patient:    Ian [name], Ian [name]                      MRN: [id number] Proc. Date: 03/03/00 Adm. Date:  [id number] Attending:  [name] CC:         Thereasa Solo. Little, M.D.  CVTS office   Operative Report  PREOPERATIVE DIAGNOSIS:  Class 3 progressive angina and congestive heart failure with severe aortic stenosis and regurgitation, 80% to 90% stenosis of the left anterior descending.  POSTOPERATIVE DIAGNOSIS:  Class 3 progressive angina and congestive heart failure with severe aortic stenosis and regurgitation, 80% to 90% stenosis of the left anterior descending.  OPERATION PERFORMED:  Aortic valve replacement and coronary artery bypass grafting x 1 (AVR with 25 pericardial bioprosthetic valve, left internal mammary artery graft to left anterior descending).  SURGEON:  Adair Patter, [place]  ANESTHESIA:  General.  ANESTHESIOLOGIST:  Bedelia Person, M.D.  INDICATIONS FOR PROCEDURE:  The patient is a 77 year old male with known aortic valve disease and progressive symptoms of dyspnea on exertion.  Cardiac catheterization by Gaspar Garbe B. Little, M.D. demonstrated significant aortic regurgitation and stenosis with 80 to 90% stenosis of the proximal LAD and left ventricular hypertrophy.  He was referred for aortic valve replacement and coronary artery bypass grafting.  Prior to the operation, I examined the patient in the cath lab holding area and reviewed the results of the cardiac catheterization with the patient and his wife.  I discussed the indications and expected benefits of aortic valve replacement and coronary artery bypass grafting surgery for his cardiac disease.  I discussed the major aspects of the operation including the choice of conduit for the LAD graft, the choice of valve replacement.  The location of the surgical incisions, the use of general anesthesia and cardiopulmonary bypass, and the expected hospital recovery.   I reviewed the risks associated with this operation including the risks of myocardial infarction, cerebrovascular accident, bleeding, infection and death.  I have discussed the alternatives to surgical therapy for his cardiac disease.  We discussed the plan for aortic valve replacement with a mechanical valve initially, but then we both felt it would be better to use a bioprosthetic valve due to his age of near 32 years and history of frequent nosebleeds requiring hospital visits.  The patient understood the risks, implications of these aspects of the operation and agreed to proceed with the operation as planned under informed consent.  OPERATIVE FINDINGS:  The patients heart is significantly enlarged.  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 The mammary artery was a good vessel with excellent flow.  The LAD had diffuse disease.  The aortic valve was bicuspid with calcification and stenosis and regurgitation.  The ascending aorta was 4 cm in diameter.  DESCRIPTION OF PROCEDURE:  The patient was brought to the operating room and placed supine on the operating table where general anesthesia was induced under invasive hemodynamic monitoring.  The chest abdomen and legs were prepped with Betadine and draped as a sterile field.  A transesophageal echocardiogram study was performed by the anesthesia service which confirmed the preoperative diagnosis of aortic insufficiency and stenosis.  A sternotomy was made and the mammary artery was harvested as a pedicle graft from its origin at the subclavian vessels.  Heparin was administered and the ACT was documented as being  therapeutic. Through pursestrings placed in the ascending aorta and right atrium, the patient was cannulated and placed on bypass.  The heart was dissected out of severe dense investing adhesions from  prior pericarditis.  The LAD was located and the mammary artery was prepared for its distal anastomosis.  Cardioplegia cannulas were placed both antegrade and retrograde delivery of blood cardioplegia and a left ventricular vent was placed via the right superior pulmonary vein.  The patient was cooled to 28 degrees and as the aortic crossclamp was applied, 700 cc of cold blood cardioplegia was delivered using a retrograde cardioplegia catheter with cardioplegic arrest and septal temperature dropping less than 12 degrees. Topical iced saline slush was used to augment myocardial preservation and a pericardial insulator pad was used to protect the left phrenic nerve.  The distal coronary anastomosis to the LAD was performed first.  The LAD was a 1.5 mm vessel with possible 90% stenosis.  It had diffuse calcium in the wall. The left internal mammary artery pedicle was brought through an opening created in the left lateral pericardium and was brought down on the LAD and sewn end-to-side with a running 8-0 Prolene.  There was immediate flash through the anastomosis after the pedicle clamp was briefly released and then reapplied.  The pedicle was secured to the epicardium with 6-0 Prolenes.  Attention was then directed to the aortic valve.  A transverse aortotomy was made and the aortic valve was inspected and found to be bicuspid with severe calcification.  It was excised and the annulus debrided of calcium.  The outflow tract was irrigated with copious amounts of cold saline.  The annulus was sized to a 25 mm tissue sizer.  18 subannular 2-0 Pledgeted mattress sutures were placed around the annulus and then brought out the sewing ring of the valve which was seated and the sutures were tied.  The valve was seated well and there was no obstruction of the coronary ostia.  The aortotomy was closed in two layers using a running 4-0 Prolene.  The coronaries were deaired with a dose of warm blood  retrograde cardioplegia and blood was deaired from  the heart with the usual maneuvers using volume from the pump and ventilating the lungs.  The aortotomy was closed after air was evacuated and the crossclamp was removed.  The heart was cardioverted back to a regular rhythm.  The mammary pedicle was opened and perfused.  There was good hemostasis at the aortotomy sites and the coronary anastomotic site.  The patient was rewarmed to 37 degrees and temporary pacing wires were applied.  Cardioplegia cannulas and LV vent were removed and pacing wires were applied.  The ventilator was resumed and patient weaned from cardiopulmonary bypass without requiring inotropes.  The echo postpump showed normal functioning prosthetic valve without evidence of aortic regurgitation with well-preserved left ventricular function.  Protamine was administered and the cannulas were removed.  The mediastinum was irrigated with warm antibiotic irrigation.  The mediastinum was irrigated with warm antibiotic irrigation.  The pericardium was loosely reapproximated.  Two mediastinal and a left pleural chest tube were placed and brought out through separate incisions.  The sternum was reapproximated with interrupted steel wire in the pectoralis fascia and the subcutaneous layers closed with running Vicryl.  The skin was closed with a subcuticular and sterile dressings were applied.  Total cardiopulmonary bypass time was 180 minutes with aortic crossclamp time of 120 minutes. DD:  03/03/00 TD:  03/03/00 Job: [id number]

## 2010-09-04 NOTE — Discharge Summary (Signed)
Ian [name], Ian [name] NO.:  [id number]   MEDICAL RECORD NO.:  [id number]          PATIENT TYPE:  INP   LOCATION:  6527                         FACILITY:  [facility]   PHYSICIAN:  Vonna Kotyk R. Jacinto Halim, MD       DATE OF BIRTH:  [birth year]   DATE OF ADMISSION:  12/05/2007  DATE OF DISCHARGE:  12/06/2007                               DISCHARGE SUMMARY   DISCHARGE DIAGNOSES:  1. Progressive coronary artery disease.  The patient had an outpatient      Myoview that showed new anterior septal ischemic changes which were      mild but new.  He had a normal ejection fraction.  It was decided      that he should undergo elective coronary catheterization.  Coronary      catheterization showed that he has new stenosis in his circumflex      90%.  He subsequently underwent Multi-Link Vision stenting 4.0 x 28      mm.  2. Known previous coronary artery disease with history of left      internal mammary artery to his left anterior descending in 2001.      This catheterization showed the left internal mammary artery to his      left anterior descending was widely patent.  His right coronary      artery had some residual disease, only 20%.  3. History of aortic tissue valve placed in 2001.  4. Atherosclerotic cardiovascular disease, new finding of a 90% right      renal artery stenosis.  He had a widely patent left renal artery.  5. History of transient ischemic attack, June 2008, previously on      Aggrenox.  6. Parkinson disease.  7. Hyperlipidemia.  8. History of mild-to-moderate bilateral carotid disease, followed by      Dr. Pearlean Brownie.   LABORATORY DATA:  Sodium 138, potassium 3.7, BUN 12, creatinine 0.8.  CPK/MB 38/1.1.  Troponin was 0.02 postprocedure.  Hemoglobin 14.4,  hematocrit 41.7, WBCs 8.2, and platelets 273.   Chest x-ray showed no active valve  disease, mild cardiomegaly.   Discharge medications are bisorpolol  7.5 mg daily, hydrochlorothiazide  25 mg daily, Sinemet 25/100  daily, omeprazole 20 mg daily.  He should  start Plavix 75 mg a day and aspirin 325 mg a day.   DISCHARGE INSTRUCTIONS:  He should do no strenuous activity, lifting,  pushing, pulling, or extended walking x1 week.  He will follow up with  Dr. Clarene Duke.  Our office will call for an appointment, and he will also  have a renal duplex ultrasound.   HOSPITAL COURSE:  Mr. House is a 77 year old male patient who has known  coronary artery disease.  He apparently had a positive Cardiolite study  in August 2009, showed some anterior septal ischemic changes.  His EF  was 66%.  Dr. Clarene Duke felt he needed a cardiac catheterization.  Thus,  this was scheduled as an outpatient and performed by Dr. Delrae Rend  on December 05, 2007, and he was found to have progressive coronary artery  disease with  new stenosis in his circumflex and he underwent Multi-Link  Vision stenting reduced from 90-0%.  He had some minor residual disease  in his RCA 20%.  LIMA to his LAD was widely patent.  He did have native  vessel stenosis in his LAD above his LIMA graft and in his diagonal.  His EF by QGS was 66%.  He did well postprocedure.  His CK-MBs and  troponins were not elevated, and he was seen by Dr. Yates Decamp on December 06, 2007, considered stable for discharge home.  His groin did not have  any hematoma.   His blood pressures is 107/56, heart rate 71, respirations 18,  temperature is 98.4.   Dr. Jacinto Halim felt he needed a renal duplex for his right renal artery  stenosis which was also new finding, and since he had a Multi-Link non-  DES stent placed that he should be on Plavix and aspirin for now, and  after the appropriate time being on Plavix he can go back to his  Aggrenox because of his history of TIA.      Lezlie Octave, N.P.      Cristy Hilts. Jacinto Halim, MD  Electronically Signed    BB/MEDQ  D:  01/11/2008  T:  01/12/2008  Job:  [id number]   cc:   Karie Schwalbe, MD  Dr. Clelia Croft

## 2010-09-04 NOTE — Consult Note (Signed)
[place]. [facility]  Patient:    Ian [name], Ian [name]                      MRN: [id number] Proc. Date: 02/29/00 Adm. Date:  [id number] Disc. Date: [id number] Attending:  [name] Dictator:   [phone] CC:         [name], M.D.             Thereasa Solo. Little, M.D.             Merilyn Baba, D.D.S. (private dentist)             Department of Dental Medicine                          Consultation Report  DATE OF BIRTH:  1928/11/21  REASON FOR CONSULTATION:  Ian [name] is a 77 year old white male, referred by Dr. Kathlee Nations Trigt III and Dr. Thereasa Solo. Little for a dental consultation.  A patient with a cardiac catheterization today, which revealed moderate to severe aortic insufficiency, aortic stenosis, single-vessel coronary artery disease of the LAD, and a well-preserved left ventricular systolic function.  The patient with anticipated aortic valve replacement/coronary artery bypass graft heart surgery with Dr. Donata Clay. The patient is now seen as part of a preaortic valve replacement heart surgery protocol.  DENTAL HISTORY: CHIEF COMPLAINT:  The patient needs a dental consultation prior to an aortic valve replacement heart surgery.  HISTORY OF PRESENT ILLNESS:  A patient with anticipated aortic valve replacement/coronary artery bypass graft heart surgery with Dr. Donata Clay as above.  The patient is now seen as part of that preaortic valve heart surgery protocol.  The patient is edentulous, with an upper and lower complete denture.  The patient has been edentulous for approximately 43 years, by the patients report.  The patient indicates that the existing dentures were last realigned approximately five years ago by Dr. Merilyn Baba, D.D.S. (private dentist).  The patient indicates that the dentures "fill the bill," but are "sort of loose."  The patient denies areas of denture irritation at this time. The patient  denies a history of sores in his mouth or any recent oral infections.  The patient denies the presence of retained root tips or impacted teeth.  DENTAL EXAMINATION:  GENERAL:  The patient is a well-developed, well-nourished white male, in no acute distress.  HEENT/NECK:  There is no palpable lymphadenopathy.  The patient has bilateral TMJ crepitus, but denies acute TMJ symptoms.  INTRAORAL EXAMINATION:  The patient with normal saliva.  The patient is edentulous.  The patient has atrophic maxillary and mandibular alveolar ridges, with the lower being much worse than the upper.  The premaxillary tissues are flabby with bilateral tuberosities, which hang down into the ideal occlusal plane.  This is consistent with a Kelly effect.  There is a 2.0 mm x 3.0 mm pigmented area on the lower right alveolar ridge tissues in the area of tooth #27 and #28, which most probably represents an amalgam tattoo.  PROSTHODONTIA:  A patient with existing upper and lower complete dentures. The patient has been edentulous since the age of 77 by the patients report. The patients last set of dentures were made in 1978, and relined by Dr. Jaynie Crumble in 1995.  Dentures have clinically less than ideal stability and retention, consistent with the alveolar ridge atrophy and flabby/redundant  soft tissues.  There is no evidence of a history of denture irritation at this time.  The dentures themselves are clean.  OCCLUSION:  The occlusion of the dentures appears to be acceptable at this time.  RADIOGRAPHIC INTERPRETATION:  (A panoramic x-ray was taken on February 29, 2000, in the department of radiology.)  The patient is edentulous.  There is atrophy of the mandibular alveolar ridges and body of the mandible.  There is no evidence of retained root tips or impacted teeth.  ASSESSMENT: 1. Edentulous. 2. Atrophy of the mandibular alveolar ridges and body of the mandible. 3. Flabby/redundant tissues of the  premaxillary soft tissues, as well as    bilateral maxillary tuberosities, which hang down into the ideal    occlusal plane.  This is consistent with the [facility] effect. 4. Upper and lower complete dentures which have less than ideal retention    and stability. 5. Pigmented lesion of the alveolar ridge area #27 and #28.  This most    likely represents an amalgam tattoo, but would suggest a consideration    of a biopsy, to rule out other pathology once the patient is stable    from his heart surgery.  Consideration could be made for a periapical    radiograph in the area, to evaluate the presence of amalgam particles    in the area. 6. Bilateral temporomandibular joint crepitus, but without acute    temporomandibular joint symptoms at this time.  PLAN/RECOMMENDATIONS: 1. I discussed the findings with the patient, and various treatment options    in relationship to his medical and dental conditions, anticipated aortic    valve replacement/coronary artery bypass graft heart surgery, and the    risks for subacute bacterial endocarditis.  I discussed evaluation for    reline or remake of his upper and lower complete dentures by the private    dentist of his choice, or a prosthodontist. 2. I discussed the possible need for a biopsy of the pigmented lesion    on the right lower alveolar ridge area #27 and #28, to rule out pathology. 3. A provision of written and verbal information on "heart valves and    mouth care," and the need for the patient to have the denture irritations    resolved accordingly, due to the risks for subacute bacterial    endocarditis. 4. A discussion of findings with Dr. Donata Clay and Dr. Julieanne Manson. 5. Provision of dental clearance for the aortic valve replacement/coronary    artery bypass graft heart surgery at this time. 6. A discussion with Dr. Jaynie Crumble (private dentist), as indicated. DD:  03/01/00 TD:  03/01/00 Job: [id number]

## 2010-09-04 NOTE — Procedures (Signed)
[facility]  Patient:    Ian [name], Ian [name]                      MRN: [id number] Proc. Date: 08/27/99 Adm. Date:  [id number] Disc. Date: [id number] Attending:  Nelda Marseille CC:         Petra Kuba, M.D.             Heather Roberts, M.D.             Rose Phi. Myna Hidalgo, M.D.                           Procedure Report  PROCEDURE:  Esophagogastroduodenoscopy with biopsy.  INDICATIONS:  Patient with ______ lymphoma, status post Helicobacter therapy with nondiagnostic biopsies x 2, due for repeat endoscopic screening.  Consent was signed after risks, benefits, methods and options were thoroughly discussed multiple times in the past.  MEDICATIONS:  Demerol 40, Versed 3.  PROCEDURE:  Video endoscope was inserted by direct vision.  The esophagus was normal.  In the distal esophagus was a tiny to small hiatal hernia.  Scope passed into the stomach and advanced to the antrum, where some mild to moderate antritis was seen, advanced through a normal pylorus into the duodenal bulb around the C-loop to a normal second portion of the duodenum. Scope was withdrawn back to the bulb and a good look there ruled out ulcers in all locations.  Scope was withdrawn back to stomach and retroflexed. Angularis, cardia, fundus, lesser and greater curve were evaluated on retroflexed and then straight visualization.  Other than some mild proximal gastritis and the distal gastritis antritis as mentioned above, no additional findings were seen. Multiple biopsies of the antrum, lesser and greater, distal curves were obtained, as well as, some proximal biopsies of the gastritis on both straight and retroflexed view were obtained.  Air was suctioned, scope slowly withdrawn, a good look at the esophagus confirmed the above findings.  Scope was removed, patient tolerated the procedure well, there was no obvious immediate complication.  ENDOSCOPIC DIAGNOSES: 1. Tiny to small hiatal  hernia. 2. Mild gastritis proximally and distally, status post biopsy. 3. Antritis, status post biopsy. 4. Otherwise within normal limits esophagogastroduodenoscopy without any    obvious signs of recurrence.  PLAN:  Await pathology, follow up p.r.n. or in four months.  Continue pump inhibitors. DD:  08/27/99 TD:  08/30/99 Job: [id number]

## 2010-09-04 NOTE — Procedures (Signed)
[facility]  Patient:    Ian [name], Ian [name]                      MRN: [id number] Proc. Date: 10/25/00 Adm. Date:  [id number] Attending:  Nelda Marseille CC:         Heather Roberts, M.D.  Rose Phi. Myna Hidalgo, M.D.  Thereasa Solo. Little, M.D.   Procedure Report  PROCEDURE:  Colonoscopy.  ENDOSCOPIST:  Petra Kuba, M.D.  INDICATIONS:  Screening.  Consent was signed after risks, benefits, methods, and options were thoroughly discussed multiple times in the office.  MEDICINES USED:  Demerol 70, Versed 5.  DESCRIPTION OF PROCEDURE:  Rectal inspection was pertinent for external hemorrhoids.  Digital exam was negative.  Video colonoscope was inserted and easily advanced around the colon to the cecum.  This did not require any abdominal pressure or any position changes.  The cecum was identified by the appendiceal orifice and the ileocecal valve.  The scope was inserted just into the opening of the terminal ileum and on quick evaluation was normal on insertion other than some left-sided diverticulum.  No other abnormalities were seen.  The prep was adequate.  There was some liquid stool that required washing and suctioning and some stool adherent to the wall, particularly of the cecum and rectum which could not be washed off.  On slow withdrawal through the colon, the cecum, ascending, and transverse were normal.  As the scope was withdrawn around the left side of the colon, in the sigmoid an occasional diverticula was seen.  In the mid sigmoid, a small polyp was seen and snared, electrocautery applied, and the polyp suctioned through the scope and collected in the trap.  Possibly there was some residual polypoid tissue which was hot biopsied x 2.  The scope was then slowly withdrawn.  No additional findings were seen as we slowly withdrew back to the rectum.  Once back in the rectum, the scope was retroflexed, pertinent for some internal hemorrhoids.  The  scope was then straightened; air was suctioned and scope removed.  The patient tolerated the procedure well.  There was no obvious immediate complication.  ENDOSCOPIC DIAGNOSIS: 1. Internal and external hemorrhoids. 2. Left-sided occasional diverticula. 3. One sigmoid small polyp status post snare and hot biopsy. 4. Otherwise within normal limits to the cecum.  PLAN:  Await pathology to determine future colonic screening.  Otherwise continue workup with an EGD. DD:  10/25/00 TD:  10/25/00 Job: [id number]

## 2010-09-04 NOTE — Procedures (Signed)
[facility]  Patient:    Ian [name], Ian [name]                      MRN: [id number] Proc. Date: 10/25/00 Adm. Date:  [id number] Attending:  Nelda Marseille CC:         Heather Roberts, M.D.  Rose Phi. Myna Hidalgo, M.D.  Julieanne Manson, M.D.   Procedure Report  PROCEDURE:  Esophagogastroduodenoscopy with biopsy.  ENDOSCOPIST:  Petra Kuba, M.D.  INDICATION:  The patient with history of MALT lymphoma due for repeat endoscopic surveillance.  Consent was signed prior to any premedications given after risks, benefits, methods, and options were thoroughly discussed multiple times in the past.  MEDICATIONS USED:  Versed 2 only.  PROCEDURE:  The video endoscope was inserted by direct vision.  The esophagus was normal.  The distal esophagus with small hiatal hernia seen.  The scope was inserted into the stomach and advanced through a normal pylorus and into a normal duodenal bulb, and around the celiac to a normal second portion of the duodenum.  Scope was withdrawn back to the bulb and a good looked there ruled out ulcer _____.   Scope was withdrawn back to the stomach and retroflexed. The angularis had some gastritis on it.  High in the cardia the hiatal hernia was confirmed, but the proximal, lesser, and greater curve were all normal. The scope was straightened with straight visualization into the stomach confirmed the significant antritis and the erythema were the previous MALT lymphoma had been, but no mass or lesion was seen.  No other abnormalities were seen.  Multiple biopsies of the antrum and distal stomach were obtained. Air was suction and scope removed.  Again, a good look at the esophagus on slow withdrawal was normal.  The scope was removed.  The patient tolerated the procedure well.  There was no obvious immediate complication.  ENDOSCOPIC DIAGNOSIS: 1. Small hiatal hernia. 2. Significant antritis status post biposy. 3. Otherwise normal  esophagogastroduodenoscopy.  PLAN:  Will await pathology to determine future endoscopic surveillance. Followup p.r.n. or in six months; however, I did write on his discharge sheet if his nausea continues or worsens he should return sooner or call me. DD:  10/25/00 TD:  10/25/00 Job: [id number]

## 2011-02-03 LAB — I-STAT 8, (EC8 V) (CONVERTED LAB)
BUN: 15
Bicarbonate: 27.2 — ABNORMAL HIGH
Glucose, Bld: 110 — ABNORMAL HIGH
Hemoglobin: 15
Sodium: 139
pCO2, Ven: 39.6 — ABNORMAL LOW
pH, Ven: 7.445 — ABNORMAL HIGH

## 2011-02-03 LAB — URINE MICROSCOPIC-ADD ON

## 2011-02-03 LAB — CBC
MCHC: 33.9
Platelets: 348
RDW: 13.9

## 2011-02-03 LAB — URINALYSIS, ROUTINE W REFLEX MICROSCOPIC
Hgb urine dipstick: NEGATIVE
Specific Gravity, Urine: 1.02
Urobilinogen, UA: 0.2

## 2011-02-03 LAB — DIFFERENTIAL
Basophils Absolute: 0.1
Basophils Relative: 1
Neutro Abs: 5.8
Neutrophils Relative %: 61

## 2011-02-03 LAB — LIPID PANEL
HDL: 44
Total CHOL/HDL Ratio: 3.8

## 2011-02-03 LAB — HOMOCYSTEINE: Homocysteine: 9.4

## 2011-02-03 LAB — PROTIME-INR
INR: 1.1
Prothrombin Time: 14.4

## 2011-02-03 LAB — APTT: aPTT: 34

## 2011-03-02 ENCOUNTER — Ambulatory Visit (INDEPENDENT_AMBULATORY_CARE_PROVIDER_SITE_OTHER): Payer: Medicare Other | Admitting: Internal Medicine

## 2011-03-02 ENCOUNTER — Encounter: Payer: Self-pay | Admitting: Internal Medicine

## 2011-03-02 VITALS — BP 140/80 | HR 56 | Temp 97.9°F | Ht 67.0 in | Wt 165.0 lb

## 2011-03-02 DIAGNOSIS — Z23 Encounter for immunization: Secondary | ICD-10-CM

## 2011-03-02 DIAGNOSIS — E785 Hyperlipidemia, unspecified: Secondary | ICD-10-CM

## 2011-03-02 DIAGNOSIS — I10 Essential (primary) hypertension: Secondary | ICD-10-CM

## 2011-03-02 DIAGNOSIS — F068 Other specified mental disorders due to known physiological condition: Secondary | ICD-10-CM

## 2011-03-02 DIAGNOSIS — G20 Parkinson's disease: Secondary | ICD-10-CM

## 2011-03-02 LAB — CBC WITH DIFFERENTIAL/PLATELET
Eosinophils Absolute: 0.4 10*3/uL (ref 0.0–0.7)
MCHC: 34 g/dL (ref 30.0–36.0)
MCV: 93.6 fl (ref 78.0–100.0)
Monocytes Absolute: 0.6 10*3/uL (ref 0.1–1.0)
Neutrophils Relative %: 69.5 % (ref 43.0–77.0)
Platelets: 281 10*3/uL (ref 150.0–400.0)

## 2011-03-02 NOTE — Assessment & Plan Note (Signed)
BP Readings from Last 3 Encounters:  03/02/11 140/80  08/31/10 150/70  03/02/10 120/60   Reasonable control No changes needed

## 2011-03-02 NOTE — Assessment & Plan Note (Signed)
I believe statin was discontinued due to cognition concerns Would probably not reconsider unless LDL >>130 despite coronary disease

## 2011-03-02 NOTE — Assessment & Plan Note (Signed)
Mild cogniitvie impairment that seems to be Parkinson's related No clear sig progression since last time

## 2011-03-02 NOTE — Progress Notes (Signed)
Subjective:    Patient ID: Ian [name], male    DOB: Sep 15, 1928, 78 y.o.   MRN: [id number]  HPI "I keep going downhill" Thinks this is from the Parkinsons No new Rx from the neurologist Interested in transferring back to local neurologist---spoke to him about Dr Modesto Charon (he will consider)  Still notes some memory issues No sig progression Has stopped driving except to doctor and store Wife writes checks (he can't write well enough) They moved and put house on market--new place has maintenance taken care of  No chest pain No SOB Still sees Dr Avon Gully at [facility]  Dr Ewing Schlein has decided to push back on colonoscopy Has had some pain in stomach---may need EGD again  Current Outpatient Prescriptions on File Prior to Visit  Medication Sig Dispense Refill  . aspirin 325 MG tablet Take 325 mg by mouth daily.        . bisoprolol (ZEBETA) 5 MG tablet Take 1 tablet (5 mg total) by mouth daily.  90 tablet  3  . carbidopa-levodopa (SINEMET CR) 25-100 MG per tablet Take 5 tablets by mouth daily.          Allergies  Allergen Reactions  . Amlodipine Besylate     REACTION: leg pain  . Carbidopa W/Levodopa     REACTION: drowsiness, bad dreams  . Penicillins     REACTION: rash  . Pramipexole Dihydrochloride     REACTION: drownsiness,bad dreams  . Simvastatin     REACTION: dizziness    Past Medical History  Diagnosis Date  . CAD (coronary artery disease)   . Allergy   . Diverticulitis   . GERD (gastroesophageal reflux disease)   . Hypertension   . Osteoarthritis   . Osteoporosis   . MALT lymphoma   . Parkinson's disease   . ASCVD (arteriosclerotic cardiovascular disease) 06/08    TIA  . Hyperlipidemia   . ED (erectile dysfunction)     Past Surgical History  Procedure Date  . Coronary artery bypass graft     Zenaida Niece trigt ) aortic valve replacement 2001  . Esophagogastroduodenoscopy     multiple, Colon/ EGD benign 11/2004  . Pilonidal cyst / sinus excision 1953  .  Inguinal hernia repair     LIH 1997Llap. bilateral hernias 1998  . Carotid stent     8/09  Stent by Dr Rosie Fate started and aggrenox stopped  . Cataract extraction 06/09    left  . Laparoscopic cholecystectomy 07/11    Dr.Rosenbower    Family History  Problem Relation Age of Onset  . Heart disease Brother     History   Social History  . Marital Status: Married    Spouse Name: N/A    Number of Children: 3  . Years of Education: N/A   Occupational History  . retired- [employer]   . part-time sub/courier for schools- now only rarely    Social History Main Topics  . Smoking status: Never Smoker   . Smokeless tobacco: Never Used  . Alcohol Use: Yes     occasional  . Drug Use: Not on file  . Sexually Active: Not on file   Other Topics Concern  . Not on file   Social History Narrative  . No narrative on file   Review of Systems More trouble sleeping---hard getting comfortable Usually will get 5-6 hours of sleep. No striking daytime somnolence Appetite is fair---weight is down a few pounds, but seems to have leveled off  Objective:   Physical Exam  Constitutional: He appears well-developed and well-nourished. No distress.  Neck: Normal range of motion. Neck supple. No thyromegaly present.  Cardiovascular: Normal rate and regular rhythm.  Exam reveals no gallop.   Murmur heard.      Gr 2/6 aortic systolic murmur  Pulmonary/Chest: Effort normal and breath sounds normal. No respiratory distress. He has no wheezes. He has no rales.  Musculoskeletal: He exhibits no edema and no tenderness.  Lymphadenopathy:    He has no cervical adenopathy.  Neurological:       Moderate resting tremor Slight increased tone Gait is short stepped but not shuffling  Psychiatric: He has a normal mood and affect. His behavior is normal. Thought content normal.          Assessment & Plan:

## 2011-03-02 NOTE — Assessment & Plan Note (Signed)
Ongoing disability He will call if he wants to change to local neurologist

## 2011-03-03 LAB — BASIC METABOLIC PANEL
BUN: 15 mg/dL (ref 6–23)
CO2: 30 mEq/L (ref 19–32)
Chloride: 103 mEq/L (ref 96–112)
Creatinine, Ser: 0.8 mg/dL (ref 0.4–1.5)

## 2011-03-03 LAB — HEPATIC FUNCTION PANEL
ALT: 5 U/L (ref 0–53)
Total Bilirubin: 0.9 mg/dL (ref 0.3–1.2)

## 2011-03-03 LAB — LIPID PANEL
Cholesterol: 159 mg/dL (ref 0–200)
LDL Cholesterol: 84 mg/dL (ref 0–99)
Triglycerides: 49 mg/dL (ref 0.0–149.0)

## 2011-03-03 LAB — TSH: TSH: 2.71 u[IU]/mL (ref 0.35–5.50)

## 2011-04-07 ENCOUNTER — Other Ambulatory Visit: Payer: Self-pay | Admitting: Gastroenterology

## 2011-05-03 ENCOUNTER — Encounter: Payer: Self-pay | Admitting: Internal Medicine

## 2011-06-07 DIAGNOSIS — R259 Unspecified abnormal involuntary movements: Secondary | ICD-10-CM | POA: Diagnosis not present

## 2011-06-07 DIAGNOSIS — G20 Parkinson's disease: Secondary | ICD-10-CM | POA: Diagnosis not present

## 2011-06-09 DIAGNOSIS — H3530 Unspecified macular degeneration: Secondary | ICD-10-CM | POA: Diagnosis not present

## 2011-06-09 DIAGNOSIS — H01009 Unspecified blepharitis unspecified eye, unspecified eyelid: Secondary | ICD-10-CM | POA: Diagnosis not present

## 2011-06-09 DIAGNOSIS — D485 Neoplasm of uncertain behavior of skin: Secondary | ICD-10-CM | POA: Diagnosis not present

## 2011-06-18 HISTORY — PX: SQUAMOUS CELL CARCINOMA EXCISION: SHX2433

## 2011-07-07 ENCOUNTER — Other Ambulatory Visit: Payer: Self-pay | Admitting: Ophthalmology

## 2011-07-07 DIAGNOSIS — D0410 Carcinoma in situ of skin of unspecified eyelid, including canthus: Secondary | ICD-10-CM | POA: Diagnosis not present

## 2011-08-04 DIAGNOSIS — I739 Peripheral vascular disease, unspecified: Secondary | ICD-10-CM | POA: Diagnosis not present

## 2011-08-04 DIAGNOSIS — I2510 Atherosclerotic heart disease of native coronary artery without angina pectoris: Secondary | ICD-10-CM | POA: Diagnosis not present

## 2011-08-06 DIAGNOSIS — C44121 Squamous cell carcinoma of skin of unspecified eyelid, including canthus: Secondary | ICD-10-CM | POA: Diagnosis not present

## 2011-08-06 DIAGNOSIS — H01009 Unspecified blepharitis unspecified eye, unspecified eyelid: Secondary | ICD-10-CM | POA: Diagnosis not present

## 2011-09-01 ENCOUNTER — Ambulatory Visit (INDEPENDENT_AMBULATORY_CARE_PROVIDER_SITE_OTHER): Payer: Medicare Other | Admitting: Internal Medicine

## 2011-09-01 ENCOUNTER — Encounter: Payer: Self-pay | Admitting: Internal Medicine

## 2011-09-01 VITALS — BP 140/70 | HR 56 | Temp 97.9°F | Ht 67.0 in | Wt 160.0 lb

## 2011-09-01 DIAGNOSIS — K219 Gastro-esophageal reflux disease without esophagitis: Secondary | ICD-10-CM

## 2011-09-01 DIAGNOSIS — M1990 Unspecified osteoarthritis, unspecified site: Secondary | ICD-10-CM

## 2011-09-01 DIAGNOSIS — Z23 Encounter for immunization: Secondary | ICD-10-CM

## 2011-09-01 DIAGNOSIS — G20A1 Parkinson's disease without dyskinesia, without mention of fluctuations: Secondary | ICD-10-CM

## 2011-09-01 DIAGNOSIS — G20 Parkinson's disease: Secondary | ICD-10-CM | POA: Diagnosis not present

## 2011-09-01 DIAGNOSIS — F0280 Dementia in other diseases classified elsewhere without behavioral disturbance: Secondary | ICD-10-CM

## 2011-09-01 DIAGNOSIS — F068 Other specified mental disorders due to known physiological condition: Secondary | ICD-10-CM | POA: Diagnosis not present

## 2011-09-01 DIAGNOSIS — I10 Essential (primary) hypertension: Secondary | ICD-10-CM

## 2011-09-01 MED ORDER — PANTOPRAZOLE SODIUM 40 MG PO TBEC: 40.0000 mg | DELAYED_RELEASE_TABLET | Freq: Every day | ORAL | Status: DC

## 2011-09-01 NOTE — Assessment & Plan Note (Signed)
Notes if he misses dose Will change back to Rx pantoprazole

## 2011-09-01 NOTE — Progress Notes (Signed)
Subjective:    Patient ID: Ian [name], male    DOB: 1928-10-04, 78 y.o.   MRN: [id number]  HPI Here with wife---she drove Only drives 2-3 miles locally. Wife feels he does a good idea  Now on artane Troubled with the dizziness and blurred vision from this Helps tremors a little---thinks pros>cons for now Memory issues are variable----expressive issues like trouble with word finding. Not clearly worse on artane Still sees Dr Rubin Payor at [facility] Still independent with ADLs  Helps some with house work (coffee, dishes, helps with laundry, etc)  Recent visit with Dr Clarene Duke No concerns No SOB Occ awoken at night with "pain in my heart"----better if he turns over or gets up  Takes prevacid fairly regular Out now Thinks it does a good job in general Has uses some left over pantoprazole  Current Outpatient Prescriptions on File Prior to Visit  Medication Sig Dispense Refill  . aspirin 325 MG tablet Take 325 mg by mouth daily.        . bisoprolol (ZEBETA) 5 MG tablet Take 1 tablet (5 mg total) by mouth daily.  90 tablet  3  . carbidopa-levodopa (SINEMET CR) 25-100 MG per tablet Take 5 tablets by mouth daily.        . lansoprazole (PREVACID) 15 MG capsule Take 15 mg by mouth daily.      . trihexyphenidyl (ARTANE) 2 MG tablet Take 2 mg by mouth 2 (two) times daily with a meal.         Allergies  Allergen Reactions  . Amlodipine Besylate     REACTION: leg pain  . Carbidopa W-Levodopa     REACTION: drowsiness, bad dreams  . Penicillins     REACTION: rash  . Pramipexole Dihydrochloride     REACTION: drownsiness,bad dreams  . Simvastatin     REACTION: dizziness    Past Medical History  Diagnosis Date  . CAD (coronary artery disease)   . Allergy   . Diverticulitis   . GERD (gastroesophageal reflux disease)   . Hypertension   . Osteoarthritis   . Osteoporosis   . MALT lymphoma   . Parkinson's disease   . ASCVD (arteriosclerotic cardiovascular disease) 06/08    TIA   . Hyperlipidemia   . ED (erectile dysfunction)     Past Surgical History  Procedure Date  . Coronary artery bypass graft     Zenaida Niece trigt ) aortic valve replacement 2001  . Esophagogastroduodenoscopy     multiple, Colon/ EGD benign 11/2004  . Pilonidal cyst / sinus excision 1953  . Inguinal hernia repair     LIH 1997Llap. bilateral hernias 1998  . Carotid stent     8/09  Stent by Dr Rosie Fate started and aggrenox stopped  . Cataract extraction 06/09    left  . Laparoscopic cholecystectomy 07/11    Dr.Rosenbower  . Squamous cell carcinoma excision 3/13    back    Family History  Problem Relation Age of Onset  . Heart disease Brother     History   Social History  . Marital Status: Married    Spouse Name: N/A    Number of Children: 3  . Years of Education: N/A   Occupational History  . retired- [employer]   . part-time sub/courier for schools- now only rarely    Social History Main Topics  . Smoking status: Never Smoker   . Smokeless tobacco: Never Used  . Alcohol Use: Yes  occasional  . Drug Use: Not on file  . Sexually Active: Not on file   Other Topics Concern  . Not on file   Social History Narrative  . No narrative on file   Review of Systems Not sleeping that well--awakens in night and takes sinemet Nocturia x 1 usually Gets pain in legs and feet---hasn't used pain meds except 1 aleve rarely     Objective:   Physical Exam  Constitutional: He appears well-developed and well-nourished. No distress.  Neck: Normal range of motion. No thyromegaly present.  Cardiovascular: Normal rate, regular rhythm, normal heart sounds and intact distal pulses.  Exam reveals no gallop.   No murmur heard. Pulmonary/Chest: Effort normal and breath sounds normal. No respiratory distress. He has no wheezes. He has no rales.  Abdominal: Soft. There is no tenderness.  Musculoskeletal: He exhibits no edema and no tenderness.  Lymphadenopathy:    He has no  cervical adenopathy.  Neurological:       Small steps but not really shuffling gait Moderate resting tremor in right hand, mild in left Very little bradykinesia (3 hours after sinemet) and fairly normal tone  Psychiatric: He has a normal mood and affect. His behavior is normal.          Assessment & Plan:

## 2011-09-01 NOTE — Assessment & Plan Note (Signed)
Mild and no sig progression apparent May be some worse on artane he feels Will need to review with neurologist

## 2011-09-01 NOTE — Assessment & Plan Note (Signed)
Try tylenol at night

## 2011-09-01 NOTE — Assessment & Plan Note (Signed)
BP Readings from Last 3 Encounters:  09/01/11 140/70  03/02/11 140/80  08/31/10 150/70   Good control Lab Results  Component Value Date   CREATININE 0.8 03/02/2011   No change needed

## 2011-09-01 NOTE — Assessment & Plan Note (Signed)
Some improvement in tremor with artane at cost of side effects Will need to review

## 2011-09-09 ENCOUNTER — Other Ambulatory Visit: Payer: Self-pay

## 2011-09-09 MED ORDER — BISOPROLOL FUMARATE 5 MG PO TABS: 5.0000 mg | ORAL_TABLET | Freq: Every day | ORAL | Status: DC

## 2011-09-09 NOTE — Telephone Encounter (Signed)
Pt request refill Bisoprolol 5 mg # 90 x 3 to [facility].

## 2011-09-14 DIAGNOSIS — G20 Parkinson's disease: Secondary | ICD-10-CM | POA: Diagnosis not present

## 2011-12-08 ENCOUNTER — Telehealth: Payer: Self-pay | Admitting: *Deleted

## 2011-12-08 DIAGNOSIS — S0100XA Unspecified open wound of scalp, initial encounter: Secondary | ICD-10-CM | POA: Diagnosis not present

## 2011-12-08 NOTE — Telephone Encounter (Signed)
Call from wife that pt fell this morning around 7am, hit his head, had a lot of bleeding and after a couple of hours the bleeding stopped, per wife around 2 pm the bleeding started back again. Wife is asking for sutures or maybe staples, I advised that pt would need to be seen at the ED because of the time limit that stitches can be done. I also advised that with pt's age and him being on a 81 mg aspirin, he would bleed a little more, per wife she will take pt to [facility].  ( I also verbally asked Dr.Tower about the ED and she agreed)

## 2011-12-09 NOTE — Telephone Encounter (Signed)
Spoke with wife they went to urgent care, they cleaned the area and put in 10 staples, pt doing fine today, office notes on your desk

## 2011-12-09 NOTE — Telephone Encounter (Signed)
Please check on him today

## 2011-12-09 NOTE — Telephone Encounter (Signed)
okay

## 2011-12-14 DIAGNOSIS — S0100XA Unspecified open wound of scalp, initial encounter: Secondary | ICD-10-CM | POA: Diagnosis not present

## 2011-12-14 DIAGNOSIS — Z4802 Encounter for removal of sutures: Secondary | ICD-10-CM | POA: Diagnosis not present

## 2011-12-14 DIAGNOSIS — I10 Essential (primary) hypertension: Secondary | ICD-10-CM | POA: Diagnosis not present

## 2011-12-16 ENCOUNTER — Ambulatory Visit (INDEPENDENT_AMBULATORY_CARE_PROVIDER_SITE_OTHER): Payer: Medicare Other | Admitting: Family Medicine

## 2011-12-16 ENCOUNTER — Encounter: Payer: Self-pay | Admitting: Family Medicine

## 2011-12-16 ENCOUNTER — Telehealth: Payer: Self-pay

## 2011-12-16 VITALS — BP 152/84 | HR 56 | Temp 97.8°F | Wt 159.2 lb

## 2011-12-16 DIAGNOSIS — I10 Essential (primary) hypertension: Secondary | ICD-10-CM | POA: Diagnosis not present

## 2011-12-16 MED ORDER — HYDROCHLOROTHIAZIDE 12.5 MG PO CAPS: 12.5000 mg | ORAL_CAPSULE | Freq: Every day | ORAL | Status: DC

## 2011-12-16 NOTE — Telephone Encounter (Signed)
Noted. Will see then.  

## 2011-12-16 NOTE — Assessment & Plan Note (Addendum)
BP Readings from Last 3 Encounters:  12/16/11 152/84  09/01/11 140/70  03/02/11 140/80  elevated blood pressures since fall.  Continues compliant with amlodipine. Will start HCTZ at 12.5mg  daily, rec f/u with PCP. On ASA 325mg  daily but no HA or other sxs to suggest bleed.

## 2011-12-16 NOTE — Progress Notes (Signed)
  Subjective:    Patient ID: Ian [name], male    DOB: [birth year], 78 y.o.   MRN: [id number]  HPI CC: recent HTN after fall  Pleasant 78yo patient of Dr. Karle Starch new to me with h/o parkinson's, dementia, HLD, HTN, lymphoma (MALT), OA and CAD presents with concerns for HTN and dizziness.  Dizziness present for last several weeks described as imbalance.    DOI: 12/08/2011 - fall and hit head, seen at [facility], 10 staples placed.  Returned 12/14/2011 for staple removal.  At that visit, BP elevated to 191 systolic.  At home, yesterday's bps have been 150-180s/50-60, pulse in 50s.  Has been taking repeated blood pressure measurements.  This morning R arm - 193/85, L arm 162/55.  BPs have been labile.  However, has never had trouble with lability in past.  Scheduled to see cards 01/18/2012.  Planning trip to beach this week.  Has renal stent in place.  Followed by Dr. Clarene Duke.  Denies chest pain/tightness, SOB, recent leg swelling.  No HA.  No LOC or vertigo.  BP Readings from Last 3 Encounters:  12/16/11 152/84  09/01/11 140/70  03/02/11 140/80   Past Medical History  Diagnosis Date  . CAD (coronary artery disease)   . Allergy   . Diverticulitis   . GERD (gastroesophageal reflux disease)   . Hypertension   . Osteoarthritis   . Osteoporosis   . MALT lymphoma   . Parkinson's disease   . ASCVD (arteriosclerotic cardiovascular disease) 06/08    TIA  . Hyperlipidemia   . ED (erectile dysfunction)    Past Surgical History  Procedure Date  . Coronary artery bypass graft     Zenaida Niece trigt ) aortic valve replacement 2001  . Esophagogastroduodenoscopy     multiple, Colon/ EGD benign 11/2004  . Pilonidal cyst / sinus excision 1953  . Inguinal hernia repair     LIH 1997Llap. bilateral hernias 1998  . Carotid stent     8/09  Stent by Dr Rosie Fate started and aggrenox stopped  . Cataract extraction 06/09    left  . Laparoscopic cholecystectomy 07/11    Dr.Rosenbower  . Squamous cell  carcinoma excision 3/13    back    Review of Systems Per HPI    Objective:   Physical Exam  Nursing note and vitals reviewed. Constitutional: He appears well-developed and well-nourished. No distress.  HENT:  Head: Normocephalic and atraumatic.  Mouth/Throat: Oropharynx is clear and moist. No oropharyngeal exudate.  Eyes: Conjunctivae and EOM are normal. Pupils are equal, round, and reactive to light. No scleral icterus.  Neck: Normal range of motion. Neck supple. Carotid bruit is not present.  Cardiovascular: Normal rate, regular rhythm and intact distal pulses.   Murmur (3/6 aortic SEM) heard. Pulmonary/Chest: Effort normal and breath sounds normal. No respiratory distress. He has no wheezes. He has no rales.  Lymphadenopathy:    He has no cervical adenopathy.  Skin: Skin is warm and dry. No rash noted.  Psychiatric: He has a normal mood and affect.       Assessment & Plan:

## 2011-12-16 NOTE — Patient Instructions (Addendum)
Blood pressure is staying elevated - start hydrochlorothiazide 12.5mg  daily. Return in 2 weeks for recheck with Dr. Alphonsus Sias. Good to see you today, call us with questions.

## 2011-12-16 NOTE — Telephone Encounter (Signed)
Pt seen 08/27 UC for cut on head; BP 192/?. At home BP ranges from 120/56 to 181/56. Pt has dizziness on and off. Pt is taking bisoprolol as instructed. Pt not sure if home cuff is accurate. No chest pain,SOB, N or V,no h/a and no pain anywhere. Pt will see Dr Sharen Hones today at 12noon, If condition changes or worsens pt's wife will call back.

## 2011-12-31 ENCOUNTER — Encounter: Payer: Self-pay | Admitting: Internal Medicine

## 2011-12-31 ENCOUNTER — Ambulatory Visit (INDEPENDENT_AMBULATORY_CARE_PROVIDER_SITE_OTHER): Payer: Medicare Other | Admitting: Internal Medicine

## 2011-12-31 VITALS — BP 150/62 | HR 63 | Temp 98.5°F | Wt 155.2 lb

## 2011-12-31 DIAGNOSIS — Z23 Encounter for immunization: Secondary | ICD-10-CM | POA: Diagnosis not present

## 2011-12-31 DIAGNOSIS — I10 Essential (primary) hypertension: Secondary | ICD-10-CM | POA: Diagnosis not present

## 2011-12-31 LAB — BASIC METABOLIC PANEL
CO2: 30 mEq/L (ref 19–32)
GFR: 87.24 mL/min (ref >60.00)
Glucose, Bld: 85 mg/dL (ref 70–99)
Potassium: 4.1 mEq/L (ref 3.5–5.1)
Sodium: 137 mEq/L (ref 135–145)

## 2011-12-31 MED ORDER — HYDROCHLOROTHIAZIDE 12.5 MG PO CAPS: 12.5000 mg | ORAL_CAPSULE | Freq: Every day | ORAL | Status: DC

## 2011-12-31 NOTE — Progress Notes (Signed)
Subjective:    Patient ID: Ian [name], male    DOB: 1928-07-20, 78 y.o.   MRN: [id number]  HPI Here with wife Started the HCTZ about 2 weeks ago Hasn't noticed any problems with it  Checking BP daily Right arm always higher 122/48-176/56 diastolics really never over 60  Has occ dizzy feeling---fairly common  May be related to Parkinson's vision Now with more blurred vision--nothing on eye exam  Current Outpatient Prescriptions on File Prior to Visit  Medication Sig Dispense Refill  . bisoprolol (ZEBETA) 5 MG tablet Take 1 tablet (5 mg total) by mouth daily.  90 tablet  3  . carbidopa-levodopa (SINEMET CR) 25-100 MG per tablet Take 8 tablets by mouth daily.       . hydrochlorothiazide (MICROZIDE) 12.5 MG capsule Take 1 capsule (12.5 mg total) by mouth daily.  30 capsule  3  . pantoprazole (PROTONIX) 40 MG tablet Take 40 mg by mouth as needed.      . trihexyphenidyl (ARTANE) 2 MG tablet Take 2 mg by mouth 2 (two) times daily with a meal.       . acetaminophen (TYLENOL) 650 MG CR tablet Take 650 mg by mouth at bedtime.        Allergies  Allergen Reactions  . Amlodipine Besylate     REACTION: leg pain  . Carbidopa W-Levodopa     REACTION: drowsiness, bad dreams  . Penicillins     REACTION: rash  . Pramipexole Dihydrochloride     REACTION: drownsiness,bad dreams  . Simvastatin     REACTION: dizziness    Past Medical History  Diagnosis Date  . CAD (coronary artery disease)   . Allergy   . Diverticulitis   . GERD (gastroesophageal reflux disease)   . Hypertension   . Osteoarthritis   . Osteoporosis   . MALT lymphoma   . Parkinson's disease   . ASCVD (arteriosclerotic cardiovascular disease) 06/08    TIA  . Hyperlipidemia   . ED (erectile dysfunction)     Past Surgical History  Procedure Date  . Coronary artery bypass graft 2001    Zenaida Niece trigt ) aortic valve replacement 2001  . Esophagogastroduodenoscopy     multiple, Colon/ EGD benign 11/2004  .  Pilonidal cyst / sinus excision 1953  . Inguinal hernia repair     LIH 1997Llap. bilateral hernias 1998  . Carotid stent     8/09  Stent by Dr Rosie Fate started and aggrenox stopped  . Cataract extraction 06/09    left  . Laparoscopic cholecystectomy 07/11    Dr.Rosenbower  . Squamous cell carcinoma excision 3/13    back  . Aortic valve replacement 2001    tissue valve    Family History  Problem Relation Age of Onset  . Heart disease Brother     History   Social History  . Marital Status: Married    Spouse Name: N/A    Number of Children: 3  . Years of Education: N/A   Occupational History  . retired- [employer]   . part-time sub/courier for schools- now only rarely    Social History Main Topics  . Smoking status: Never Smoker   . Smokeless tobacco: Never Used  . Alcohol Use: Yes     very rare  . Drug Use: No  . Sexually Active: Not on file   Other Topics Concern  . Not on file   Social History Narrative  . No narrative on file  Review of Systems No headaches Appetite isn't great---has been losing some weight    Objective:   Physical Exam  Constitutional: He appears well-developed. No distress.  Neck: Normal range of motion.  Cardiovascular: Normal rate and regular rhythm.  Exam reveals no gallop.   Murmur heard.      Gr 2/6 aortic systolic murmur  Pulmonary/Chest: Effort normal and breath sounds normal. No respiratory distress. He has no wheezes. He has no rales.  Lymphadenopathy:    He has no cervical adenopathy.  Neurological:       Moderate resting tremor and some bradykinesia          Assessment & Plan:

## 2011-12-31 NOTE — Assessment & Plan Note (Signed)
BP Readings from Last 3 Encounters:  12/31/11 150/62  12/16/11 152/84  09/01/11 140/70   I think this is as low as we should go High risk for orthostasis with his Parkinson's  Most home diastolics in 40's and 50's Could consider changing to fixed dose ziac next time (slightly lower HCTZ dose)

## 2012-01-19 ENCOUNTER — Inpatient Hospital Stay (HOSPITAL_COMMUNITY)
Admission: AD | Admit: 2012-01-19 | Discharge: 2012-01-22 | DRG: 243 | Disposition: A | Discharge status: Home / Self Care | Payer: Medicare Other | Source: Ambulatory Visit | Attending: Cardiovascular Disease | Admitting: Cardiovascular Disease

## 2012-01-19 ENCOUNTER — Encounter (HOSPITAL_COMMUNITY): Payer: Self-pay | Admitting: Cardiology

## 2012-01-19 DIAGNOSIS — C8589 Other specified types of non-Hodgkin lymphoma, extranodal and solid organ sites: Secondary | ICD-10-CM | POA: Diagnosis present

## 2012-01-19 DIAGNOSIS — R001 Bradycardia, unspecified: Secondary | ICD-10-CM | POA: Diagnosis present

## 2012-01-19 DIAGNOSIS — Z950 Presence of cardiac pacemaker: Secondary | ICD-10-CM | POA: Diagnosis not present

## 2012-01-19 DIAGNOSIS — Z952 Presence of prosthetic heart valve: Secondary | ICD-10-CM

## 2012-01-19 DIAGNOSIS — Z951 Presence of aortocoronary bypass graft: Secondary | ICD-10-CM | POA: Diagnosis not present

## 2012-01-19 DIAGNOSIS — M1990 Unspecified osteoarthritis, unspecified site: Secondary | ICD-10-CM | POA: Diagnosis present

## 2012-01-19 DIAGNOSIS — Z79899 Other long term (current) drug therapy: Secondary | ICD-10-CM | POA: Diagnosis not present

## 2012-01-19 DIAGNOSIS — I350 Nonrheumatic aortic (valve) stenosis: Secondary | ICD-10-CM | POA: Diagnosis present

## 2012-01-19 DIAGNOSIS — F0280 Dementia in other diseases classified elsewhere without behavioral disturbance: Secondary | ICD-10-CM | POA: Diagnosis present

## 2012-01-19 DIAGNOSIS — M810 Age-related osteoporosis without current pathological fracture: Secondary | ICD-10-CM | POA: Diagnosis present

## 2012-01-19 DIAGNOSIS — G20 Parkinson's disease: Secondary | ICD-10-CM | POA: Diagnosis present

## 2012-01-19 DIAGNOSIS — I2510 Atherosclerotic heart disease of native coronary artery without angina pectoris: Secondary | ICD-10-CM | POA: Diagnosis not present

## 2012-01-19 DIAGNOSIS — I498 Other specified cardiac arrhythmias: Secondary | ICD-10-CM | POA: Diagnosis present

## 2012-01-19 DIAGNOSIS — I10 Essential (primary) hypertension: Secondary | ICD-10-CM | POA: Diagnosis present

## 2012-01-19 DIAGNOSIS — I441 Atrioventricular block, second degree: Principal | ICD-10-CM | POA: Diagnosis present

## 2012-01-19 DIAGNOSIS — Z85828 Personal history of other malignant neoplasm of skin: Secondary | ICD-10-CM

## 2012-01-19 DIAGNOSIS — K219 Gastro-esophageal reflux disease without esophagitis: Secondary | ICD-10-CM | POA: Diagnosis present

## 2012-01-19 DIAGNOSIS — I25810 Atherosclerosis of coronary artery bypass graft(s) without angina pectoris: Secondary | ICD-10-CM | POA: Diagnosis not present

## 2012-01-19 DIAGNOSIS — I701 Atherosclerosis of renal artery: Secondary | ICD-10-CM | POA: Diagnosis present

## 2012-01-19 DIAGNOSIS — Z8673 Personal history of transient ischemic attack (TIA), and cerebral infarction without residual deficits: Secondary | ICD-10-CM

## 2012-01-19 DIAGNOSIS — E785 Hyperlipidemia, unspecified: Secondary | ICD-10-CM | POA: Diagnosis present

## 2012-01-19 DIAGNOSIS — I359 Nonrheumatic aortic valve disorder, unspecified: Secondary | ICD-10-CM | POA: Diagnosis not present

## 2012-01-19 DIAGNOSIS — Z01811 Encounter for preprocedural respiratory examination: Secondary | ICD-10-CM | POA: Diagnosis not present

## 2012-01-19 DIAGNOSIS — I4891 Unspecified atrial fibrillation: Secondary | ICD-10-CM | POA: Diagnosis not present

## 2012-01-19 DIAGNOSIS — I442 Atrioventricular block, complete: Secondary | ICD-10-CM | POA: Diagnosis present

## 2012-01-19 HISTORY — DX: Nonrheumatic aortic (valve) stenosis: I35.0

## 2012-01-19 LAB — BASIC METABOLIC PANEL
CO2: 31 mEq/L (ref 19–32)
Chloride: 104 mEq/L (ref 96–112)
Sodium: 142 mEq/L (ref 135–145)

## 2012-01-19 LAB — CBC
MCV: 90.9 fL (ref 78.0–100.0)
Platelets: 245 10*3/uL (ref 150–400)
RBC: 4.08 MIL/uL — ABNORMAL LOW (ref 4.22–5.81)
WBC: 9 10*3/uL (ref 4.0–10.5)

## 2012-01-19 MED ORDER — HYDROCHLOROTHIAZIDE 12.5 MG PO CAPS
12.5000 mg | ORAL_CAPSULE | Freq: Every day | ORAL | Status: DC
  Administered 2012-01-19 – 2012-01-22 (×4): 12.5 mg via ORAL
  Filled 2012-01-19 (×4): qty 1

## 2012-01-19 MED ORDER — PANTOPRAZOLE SODIUM 40 MG PO TBEC
40.0000 mg | DELAYED_RELEASE_TABLET | ORAL | Status: DC | PRN
  Filled 2012-01-19: qty 1

## 2012-01-19 MED ORDER — ASPIRIN EC 325 MG PO TBEC
325.0000 mg | DELAYED_RELEASE_TABLET | Freq: Every day | ORAL | Status: DC
  Administered 2012-01-19 – 2012-01-22 (×4): 325 mg via ORAL
  Filled 2012-01-19 (×4): qty 1

## 2012-01-19 MED ORDER — CARBIDOPA-LEVODOPA 25-100 MG PO TBCR: 8.0000 | EXTENDED_RELEASE_TABLET | Freq: Every day | ORAL | Status: DC

## 2012-01-19 MED ORDER — CARBIDOPA-LEVODOPA CR 25-100 MG PO TBCR
2.0000 | EXTENDED_RELEASE_TABLET | ORAL | Status: DC
  Administered 2012-01-19 – 2012-01-22 (×8): 2 via ORAL
  Filled 2012-01-19 (×13): qty 2

## 2012-01-19 NOTE — H&P (Addendum)
Brief History and Physical Note:  NAME:  Ian [name]   MRN: [id number] DOB:  Feb 13, 1929   ADMIT DATE: 01/19/2012   01/19/2012 2:57 PM  Ian [name] is a 78 y.o. male with a PMH of CAD/AS s/p AVR&CABGx1 (LIMA-LAD, Porcine Bioprosthetic Valve) in 2001, BMS PCI to LCx in 2009, rRAS, HTN, HLD, & Parkinson's disease who was seen by Dr. Allyson Sabal @ [facility] for routine clinic follow-up & was found to be profoundly bradycardic on ECG with S Brady/SArrythmia & what appears to be 2:1 Mobitz AVB (in the setting of Bifascicular Block).  He is completely stable & denies any light headedness, dizziness, syncope or near syncope symptoms.   He has an Echo done in the office & was sent for direct admission / observation.  He is currently lying in bed in NAD with TPM pads in place, but totally asymptomatic.  Cardiovascular ROS: no chest pain or dyspnea on exertion positive for - edema, irregular heartbeat and murmur negative for - dyspnea on exertion, loss of consciousness, orthopnea, palpitations, paroxysmal nocturnal dyspnea, rapid heart rate or shortness of breath  Past Medical History  Diagnosis Date  . CAD (coronary artery disease) CABG x1 2001; PCI 2008    s/p LIMA-LAD in2001 with AVR; BMS to LCx 8/08  . Allergy   . Diverticulitis   . GERD (gastroesophageal reflux disease)   . Hypertension   . Osteoarthritis   . Osteoporosis   . MALT lymphoma   . Parkinson's disease   . ASCVD (arteriosclerotic cardiovascular disease) 06/08    TIA  . Hyperlipidemia   . ED (erectile dysfunction)   . Aortic valve stenosis, acquired 2001    s/p Porcine AVR; by 05/2009 Echo - EF 45-50%, moderate AS -- AVA 0.98 cm2, peak gradient  . Right renal artery stenosis 2012    90% stenosis - stable   Past Surgical History  Procedure Date  . Coronary artery bypass graft 2001    Zenaida Niece trigt ) aortic valve replacement 2001; LIMA-LAD.  Marland Kitchen Esophagogastroduodenoscopy     multiple, Colon/ EGD benign 11/2004  . Pilonidal  cyst / sinus excision 1953  . Inguinal hernia repair     LIH 1997Llap. bilateral hernias 1998  . Coronary stent placement 11/2007    8/09  Left Circumflex Stent by Dr Rosie Fate started and aggrenox stopped  . Cataract extraction 06/09    left  . Laparoscopic cholecystectomy 07/11    Dr.Rosenbower  . Squamous cell carcinoma excision 3/13    back  . Aortic valve replacement 2001    Porcine valve    FAMHx: Family History  Problem Relation Age of Onset  . Heart disease Brother    SOCHx:  reports that he has never smoked. He has never used smokeless tobacco. He reports that he drinks alcohol. He reports that he does not use illicit drugs.  ALLERGIES: Allergies  Allergen Reactions  . Amlodipine Besylate     REACTION: leg pain  . Carbidopa W-Levodopa     REACTION: drowsiness, bad dreams  . Penicillins     REACTION: rash  . Pramipexole Dihydrochloride     REACTION: drownsiness,bad dreams  . Simvastatin     REACTION: dizziness    HOME MEDICATIONS: Prescriptions prior to admission  Medication Sig Dispense Refill  . acetaminophen (TYLENOL) 650 MG CR tablet Take 650 mg by mouth at bedtime.      Marland Kitchen aspirin 81 MG tablet Take 2 tablets by mouth daily.      Marland Kitchen  bisoprolol (ZEBETA) 5 MG tablet Take 1 tablet (5 mg total) by mouth daily.  90 tablet  3  . carbidopa-levodopa (SINEMET CR) 25-100 MG per tablet Take 2 tablets by mouth every 6 (six) hours.      . hydrochlorothiazide (MICROZIDE) 12.5 MG capsule Take 1 capsule (12.5 mg total) by mouth daily.  90 capsule  3  . pantoprazole (PROTONIX) 40 MG tablet Take 40 mg by mouth daily as needed. For acid reflux      . trihexyphenidyl (ARTANE) 2 MG tablet Take 2 mg by mouth 2 (two) times daily with a meal.        Review of Systems - Negative except baseline tremor, PM swelling.  Please see scanned report by Dr. Allyson Sabal for details.  PHYSICAL EXAM:Blood pressure 166/66, pulse 35, temperature 97.2 F (36.2 C), temperature source Oral, height  5\' 8"  (1.727 m), weight 71.2 kg (156 lb 15.5 oz), SpO2 97.00%. General appearance: alert, cooperative, appears stated age and no distress Neck: no carotid bruit, no JVD and supple, symmetrical, trachea midline Lungs: clear to auscultation bilaterally, normal percussion bilaterally and non-labored Heart: regularly irregular rhythm, S1, S2 normal, no S3 or S4 and systolic murmur: systolic ejection 2/6, crescendo and decrescendo at 2nd left intercostal space, at 2nd right intercostal space Abdomen: soft, non-tender; bowel sounds normal; no masses,  no organomegaly Extremities: extremities normal, atraumatic, no cyanosis or edema Pulses: 2+ and symmetric Skin: Skin color, texture, turgor normal. No rashes or lesions Neurologic: Grossly normal; baseline tremor.  IMPRESSION & PLAN The patients' history has been reviewed, patient examined, no change in status from most recent note - to be scanned into the chart with full details.  I have reviewed the patients' chart and labs. Questions were answered to the patient's satisfaction.  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  Right renal artery stenosis - 90%  HYPERLIPIDEMIA  Dementia in Parkinson's disease  HYPERTENSION   Ian [name] has presented today for observation while bradycardic.  We will hold his BB & monitor for symptoms.  He currently seems stable & does not require temporary pacemaker.  His ECG demonstrates widened QRS & Bifascicular Block c/w High Degree AV Block (2:1 appearance, therefore cannot fully determine Mobitz Type 1 vs Type 2).    He will most likely require PPM placement.  Will anticipate EP consult.   HARDING,DAVID W [facility] [address] [place], [place]  [address]  [phone]  01/19/2012 2:57 PM

## 2012-01-20 DIAGNOSIS — I25810 Atherosclerosis of coronary artery bypass graft(s) without angina pectoris: Secondary | ICD-10-CM | POA: Diagnosis not present

## 2012-01-20 DIAGNOSIS — I498 Other specified cardiac arrhythmias: Secondary | ICD-10-CM

## 2012-01-20 DIAGNOSIS — I10 Essential (primary) hypertension: Secondary | ICD-10-CM | POA: Diagnosis not present

## 2012-01-20 DIAGNOSIS — I359 Nonrheumatic aortic valve disorder, unspecified: Secondary | ICD-10-CM | POA: Diagnosis not present

## 2012-01-20 LAB — BASIC METABOLIC PANEL
CO2: 32 mEq/L (ref 19–32)
Glucose, Bld: 111 mg/dL — ABNORMAL HIGH (ref 70–99)
Potassium: 3.9 mEq/L (ref 3.5–5.1)
Sodium: 143 mEq/L (ref 135–145)

## 2012-01-20 MED ORDER — POLYETHYLENE GLYCOL 3350 17 G PO PACK
17.0000 g | PACK | Freq: Every day | ORAL | Status: DC | PRN
  Filled 2012-01-20: qty 1

## 2012-01-20 MED ORDER — DOCUSATE SODIUM 50 MG/5ML PO LIQD
100.0000 mg | Freq: Two times a day (BID) | ORAL | Status: DC
  Administered 2012-01-20 – 2012-01-21 (×3): 100 mg via ORAL
  Filled 2012-01-20 (×6): qty 10

## 2012-01-20 MED ORDER — DOCUSATE SODIUM 100 MG PO CAPS
ORAL_CAPSULE | ORAL | Status: AC
  Administered 2012-01-20: 100 mg
  Filled 2012-01-20: qty 1

## 2012-01-20 MED ORDER — TRIHEXYPHENIDYL HCL 2 MG PO TABS
2.0000 mg | ORAL_TABLET | Freq: Two times a day (BID) | ORAL | Status: DC
  Filled 2012-01-20: qty 1

## 2012-01-20 MED ORDER — TRIHEXYPHENIDYL HCL 2 MG PO TABS
2.0000 mg | ORAL_TABLET | Freq: Two times a day (BID) | ORAL | Status: DC
  Administered 2012-01-20: 2 mg via ORAL
  Filled 2012-01-20 (×6): qty 1

## 2012-01-20 NOTE — Consult Note (Signed)
ELECTROPHYSIOLOGY CONSULT NOTE  Patient ID: Ian [name] MRN: [id number], DOB/AGE: [birth year]   Admit date: 01/19/2012 Date of Consult: 01/20/2012  Primary Physician: Tillman Abide, MD Primary Cardiologist: Allyson Sabal, MD Reason for Consultation: Bradycardia  History of Present Illness Ian [name] is an 78 year old gentleman with CAD/AS s/p CABGx1 and AVR (LIMA to LAD; porcine bioprosthetic valve) in 2001, BMS PCI to LCx in 2009, rRAS, HTN, dyslipidemia and Parkinson's disease who was seen by Dr. Allyson Sabal for routine clinic follow-up and was found to be profoundly bradycardic in 2:1 AVB, in the setting of bifascicular block. He was admitted for further evaluation. He and his wife report his mentation is slower and he feels sleepy and dizzy most of the time. He fell about 4 weeks ago and since that time his wife has been monitoring his BP at home. She also noticed his heart rate range was anywhere from 39-50. He denies syncope and states he fell because he tripped over his pant leg as he was trying to get dressed. He has no other history of syncope. He was on bisoprolol at home which is now being held.  Past Medical History Past Medical History  Diagnosis Date  . CAD (coronary artery disease) CABG x1 2001; PCI 2008    s/p LIMA-LAD in2001 with AVR; BMS to LCx 8/08  . Allergy   . Diverticulitis   . GERD (gastroesophageal reflux disease)   . Hypertension   . Osteoarthritis   . Osteoporosis   . MALT lymphoma   . Parkinson's disease   . ASCVD (arteriosclerotic cardiovascular disease) 06/08    TIA  . Hyperlipidemia   . ED (erectile dysfunction)   . Aortic valve stenosis, acquired 2001    s/p Porcine AVR; by 05/2009 Echo - EF 45-50%, moderate AS -- AVA 0.98 cm2, peak gradient  . Right renal artery stenosis 2012    90% stenosis - stable    Past Surgical History Past Surgical History  Procedure Date  . Coronary artery bypass graft 2001    Zenaida Niece trigt ) aortic valve replacement 2001;  LIMA-LAD.  Marland Kitchen Esophagogastroduodenoscopy     multiple, Colon/ EGD benign 11/2004  . Pilonidal cyst / sinus excision 1953  . Inguinal hernia repair     LIH 1997Llap. bilateral hernias 1998  . Coronary stent placement 11/2007    8/09  Left Circumflex Stent by Dr Rosie Fate started and aggrenox stopped  . Cataract extraction 06/09    left  . Laparoscopic cholecystectomy 07/11    Dr.Rosenbower  . Squamous cell carcinoma excision 3/13    back  . Aortic valve replacement 2001    Porcine valve     Allergies/Intolerances Allergies  Allergen Reactions  . Amlodipine Besylate     REACTION: leg pain  . Carbidopa W-Levodopa     REACTION: drowsiness, bad dreams  . Penicillins     REACTION: rash  . Pramipexole Dihydrochloride     REACTION: drownsiness,bad dreams  . Simvastatin     REACTION: dizziness    Inpatient Medications . aspirin EC  325 mg Oral Daily  . carbidopa-levodopa  2 tablet Oral Custom  . docusate  100 mg Oral BID  . docusate sodium      . hydrochlorothiazide  12.5 mg Oral Daily  . DISCONTD: carbidopa-levodopa  8 tablet Oral Daily   Family History Family History  Problem Relation Age of Onset  . Heart disease Brother      Social History Social History  . Marital  Status: Married    Spouse Name: N/A    Number of Children: 3  . Years of Education: N/A   Occupational History  . retired- [employer]   . part-time sub/courier for schools- now only rarely    Social History Main Topics  . Smoking status: Never Smoker   . Smokeless tobacco: Never Used  . Alcohol Use: Yes     very rare  . Drug Use: No   Review of Systems General: No chills, fever, night sweats or weight changes  Cardiovascular: No chest pain, dyspnea on exertion, edema, orthopnea, palpitations, paroxysmal nocturnal dyspnea Dermatological: No rash, lesions or masses Respiratory: No cough, dyspnea Urologic: No hematuria, dysuria Abdominal: No nausea, vomiting, diarrhea, bright red  blood per rectum, melena, or hematemesis Neurologic:  No visual changes All other systems reviewed and are otherwise negative except as noted above.  Physical Exam Blood pressure 166/80, pulse 31, temperature 97.9 F (36.6 C), temperature source Oral, resp. rate 16, height 5\' 8"  (1.727 m), weight 156 lb 15.5 oz (71.2 kg), SpO2 94.00%.  General: Well developed, well appearing 78 year old male in no acute distress. HEENT: Normocephalic, atraumatic. EOMs intact. Sclera nonicteric. Oropharynx clear.  Neck: Supple without bruits. No JVD. Lungs:  Respirations regular and unlabored, CTA bilaterally. Heart: RRR. S1, S2 present. No murmurs, rub, S3 or S4. Abdomen: Soft, non-tender, non-distended. BS present x 4 quadrants. No hepatosplenomegaly.  Extremities: No clubbing, cyanosis or edema. DP/PT/Radials 2+ and equal bilaterally. Psych: Normal affect. Neuro: Alert and oriented X 3. Moves all extremities spontaneously. Musculoskeletal: No kyphosis. Skin: Intact. Warm and dry. No rashes or petechiae in exposed areas.   Labs No results found for this basename: CKTOTAL:4,CKMB:4,TROPONINI:4 in the last 72 hours Lab Results  Component Value Date   WBC 9.0 01/19/2012   HGB 12.6* 01/19/2012   HCT 37.1* 01/19/2012   MCV 90.9 01/19/2012   PLT 245 01/19/2012    Lab 01/20/12 0535  NA 143  K 3.9  CL 104  CO2 32  BUN 12  CREATININE 0.85  CALCIUM 9.5  PROT --  BILITOT --  ALKPHOS --  ALT --  AST --  GLUCOSE 111*    Radiology/Studies No results found.  Echocardiogram  01/19/12 from [facility] - EF>55%. Suggestion of restrictive physiology. LA is severely dilated, Moderate MR, Mod pul HTN with RV systolic pressure of 40-50 mmHg. Mild AR.   Telemetry shows mostly 2:1 AVB with rates in the 30s-40s  Assessment and Plan 1. High grade heart block 2. CAD 3. Prior AVR Rec: His heart rates have not improved much,if any since stopping his AV nodal blocking drugs. I would watch overnight with  plans for PPM tomorrow unless his heart rate improves markedly.

## 2012-01-20 NOTE — Care Management Note (Unsigned)
    Page 1 of 1   01/20/2012     1:30:14 PM   CARE MANAGEMENT NOTE 01/20/2012  Patient:  Ian [name], Ian [name]   Account Number:  [id number]  Date Initiated:  01/20/2012  Documentation initiated by:  SIMMONS,Kevon Tench  Subjective/Objective Assessment:   ADMITTED WITH HEART BLOCK; LIVES AT HOME WITH WIFE; WAS IPTA.     Action/Plan:   DISCHARGE PLANNING INITIATED.   Anticipated DC Date:  01/21/2012   Anticipated DC Plan:  HOME/SELF CARE      DC Planning Services  CM consult      Choice offered to / List presented to:             Status of service:  In process, will continue to follow Medicare Important Message given?   (If response is "NO", the following Medicare IM given date fields will be blank) Date Medicare IM given:   Date Additional Medicare IM given:    Discharge Disposition:    Per UR Regulation:  Reviewed for med. necessity/level of care/duration of stay  If discussed at Long Length of Stay Meetings, dates discussed:    Comments:  01/20/12  1029  Demitrus Francisco SIMMONS RN, BSN [phone] NCM WILL FOLLOW.

## 2012-01-20 NOTE — Progress Notes (Signed)
Subjective: Still complains of increased fatigue, believes his thought process is slower also.  Objective: Vital signs in last 24 hours: Temp:  [97.2 F (36.2 C)-97.9 F (36.6 C)] 97.9 F (36.6 C) (10/03 0334) Pulse Rate:  [31-50] 31  (10/03 0334) Resp:  [16-18] 16  (10/03 0334) BP: (166-168)/(50-80) 166/80 mmHg (10/03 0334) SpO2:  [93 %-97 %] 94 % (10/03 0334) Weight:  [71.2 kg (156 lb 15.5 oz)] 71.2 kg (156 lb 15.5 oz) (10/02 1300) Weight change:  Last BM Date: 01/18/12 Intake/Output from previous day: not documented   Intake/Output this shift:    PE: General:alert and oriented X 2, tremor greater in rt.  Heart:S1S2 IRReg with pauses, soft murmur Lungs:clear without rales rhonchi or wheezes Abd:+ BS soft, non tender Ext:no edema Neuro:alert and oriented, slow at times to put his words together   Lab Results:  [facility] 01/19/12 1535  WBC 9.0  HGB 12.6*  HCT 37.1*  PLT 245   BMET  Basename 01/19/12 1535  NA 142  K 3.6  CL 104  CO2 31  GLUCOSE 124*  BUN 13  CREATININE 0.87  CALCIUM 9.4   No results found for this basename: TROPONINI:2,CK,MB:2 in the last 72 hours  Lab Results  Component Value Date   CHOL 159 03/02/2011   HDL 65.60 03/02/2011   LDLCALC 84 03/02/2011   LDLDIRECT 98.3 07/21/2006   TRIG 49.0 03/02/2011   CHOLHDL 2 03/02/2011   No results found for this basename: HGBA1C     Lab Results  Component Value Date   TSH 2.71 03/02/2011    Hepatic Function Panel No results found for this basename: PROT,ALBUMIN,AST,ALT,ALKPHOS,BILITOT,BILIDIR,IBILI in the last 72 hours No results found for this basename: CHOL in the last 72 hours No results found for this basename: PROTIME in the last 72 hours    EKG: ~SBrady /Sinus Arrythmia (P waves with Ventriculophasic changes)  with prolonged PR interval; PVCs noted in bigeminy pattern.  Still appears to have high degree AVB. Tele - short run of what looks like junctional tachycardia this  AM. Studies/Results: No results found.  Medications: I have reviewed the patient's current medications.    Marland Kitchen aspirin EC  325 mg Oral Daily  . carbidopa-levodopa  2 tablet Oral Custom  . hydrochlorothiazide  12.5 mg Oral Daily  . DISCONTD: carbidopa-levodopa  8 tablet Oral Daily   Assessment/Plan: Principal Problem:  *Bradycardia by electrocardiogram Active Problems:  HYPERLIPIDEMIA  Dementia in Parkinson's disease  Parkinson disease  HYPERTENSION  CORONARY ARTERY DISEASE --  s/p LIMA-LAD & PCI to LCx  Aortic valve stenosis, acquired -- s/p AVR with Porcie Bioprosthetic AVR; By Echo in 05/2009 moderate stenosis,AVA 0.98cm.  Right renal artery stenosis - 90%  Heart block AV second degree -- 2:1  PLAN: HR still 40-30's with 2:1, non conducted PACs? And Junct escape beats,  Zebeta stopped. Pt with Trifascicular block, RBBB, LAD, Mobitz.  Symptomatic with Slower mentation, increased fatigue and sleepy all the time.  New ECHO 01/19/12 from our office:  EF>55%- suggestion of restrictive physiology. LA is severly dilated, Moderate MR, Mod pul HTN with RV systolic Pressure of 40-50 mmHg  Mild aortic reg.   Also with Tachycardia at 100 Appears junct tach.   Pt has no plans for a brain stimulator for his Parkinson's.  He is followed at [facility] for Parkinson's.  Have asked EP to see.  Pt also relates an injury to his head,  Fell 4 weeks ago and lacerated his scalp.  No CT  of the head was done.   LOS: 1 day   INGOLD,LAURA R 01/20/2012, 7:59 AM  I have seen & examined the patient this AM along with Nada Boozer, NP.   I agree with her findings, examination & impressions.  Mr. Gelpi has had some time to think on his symptoms that he did not recall yesterday -- in retrospect, he does note increased fatigue, slower mentation & sleepiness.  He also had a relatively recent fall while getting dressed -- says it was due to his carelessness.  Today he feels fine.  Is more concerned re: not  getting his Parkinson's tremor medicine.  Plan:  I have discussed his case with Dr. Ladona Ridgel of [facility] EP who has agreed to see the patient this PM.  His initial inclination is to continue to monitor overnight to allow at least ~48 hr off BB & if HR still low with high grade block --> plan PPM tomorrow with d/c Saturday.  Benefit of PPM would be to allow use of BB for BP control as his BP is elevated on HCTZ alone.  CCB "allergy" & reluctant to use ACE-I/ARB with RAS.    Marykay Lex, M.D., M.S. [facility] [address] [place], [place]  [address]  [phone] Pager # [phone]  01/20/2012 10:00 AM

## 2012-01-21 ENCOUNTER — Encounter (HOSPITAL_COMMUNITY)
Admission: AD | Disposition: A | Discharge status: Home / Self Care | Payer: Self-pay | Source: Ambulatory Visit | Attending: Cardiovascular Disease

## 2012-01-21 ENCOUNTER — Inpatient Hospital Stay (HOSPITAL_COMMUNITY): Payer: Medicare Other

## 2012-01-21 DIAGNOSIS — I2510 Atherosclerotic heart disease of native coronary artery without angina pectoris: Secondary | ICD-10-CM | POA: Diagnosis not present

## 2012-01-21 DIAGNOSIS — I442 Atrioventricular block, complete: Secondary | ICD-10-CM

## 2012-01-21 DIAGNOSIS — Z01811 Encounter for preprocedural respiratory examination: Secondary | ICD-10-CM | POA: Diagnosis not present

## 2012-01-21 HISTORY — PX: PERMANENT PACEMAKER INSERTION: SHX5480

## 2012-01-21 LAB — PROTIME-INR
INR: 1.09 (ref 0.00–1.49)
Prothrombin Time: 14 s (ref 11.6–15.2)

## 2012-01-21 SURGERY — PERMANENT PACEMAKER INSERTION
Anesthesia: LOCAL

## 2012-01-21 MED ORDER — ONDANSETRON HCL 4 MG/2ML IJ SOLN: 4.0000 mg | Freq: Four times a day (QID) | INTRAMUSCULAR | Status: DC | PRN

## 2012-01-21 MED ORDER — SODIUM CHLORIDE 0.9 % IJ SOLN: 3.0000 mL | INTRAMUSCULAR | Status: DC | PRN

## 2012-01-21 MED ORDER — SODIUM CHLORIDE 0.9 % IJ SOLN
3.0000 mL | Freq: Two times a day (BID) | INTRAMUSCULAR | Status: DC
  Administered 2012-01-21: 3 mL via INTRAVENOUS

## 2012-01-21 MED ORDER — CHLORHEXIDINE GLUCONATE 4 % EX LIQD
60.0000 mL | Freq: Once | CUTANEOUS | Status: AC
  Administered 2012-01-21: 4 via TOPICAL
  Filled 2012-01-21: qty 60

## 2012-01-21 MED ORDER — SODIUM CHLORIDE 0.45 % IV SOLN
INTRAVENOUS | Status: DC
  Administered 2012-01-21: 11:00:00 via INTRAVENOUS

## 2012-01-21 MED ORDER — VANCOMYCIN HCL IN DEXTROSE 1-5 GM/200ML-% IV SOLN
1000.0000 mg | Freq: Two times a day (BID) | INTRAVENOUS | Status: AC
  Administered 2012-01-22: 1000 mg via INTRAVENOUS
  Filled 2012-01-21: qty 200

## 2012-01-21 MED ORDER — VANCOMYCIN HCL IN DEXTROSE 1-5 GM/200ML-% IV SOLN
1000.0000 mg | INTRAVENOUS | Status: DC
  Filled 2012-01-21 (×2): qty 200

## 2012-01-21 MED ORDER — SODIUM CHLORIDE 0.9 % IR SOLN
80.0000 mg | Status: DC
  Filled 2012-01-21: qty 2

## 2012-01-21 MED ORDER — ACETAMINOPHEN 325 MG PO TABS: 325.0000 mg | ORAL_TABLET | ORAL | Status: DC | PRN

## 2012-01-21 MED ORDER — HEPARIN (PORCINE) IN NACL 2-0.9 UNIT/ML-% IJ SOLN
INTRAMUSCULAR | Status: AC
  Filled 2012-01-21: qty 500

## 2012-01-21 MED ORDER — HYDROCODONE-ACETAMINOPHEN 5-325 MG PO TABS: 1.0000 | ORAL_TABLET | ORAL | Status: DC | PRN

## 2012-01-21 MED ORDER — LIDOCAINE HCL (PF) 1 % IJ SOLN
INTRAMUSCULAR | Status: AC
  Filled 2012-01-21: qty 60

## 2012-01-21 NOTE — Progress Notes (Signed)
Subjective: HR still in the 30's- per Dr. Ladona Ridgel will plan for PPM today.  Objective: Vital signs in last 24 hours: Temp:  [97.3 F (36.3 C)-98.5 F (36.9 C)] 98.5 F (36.9 C) (10/04 0454) Pulse Rate:  [31-50] 31  (10/04 0454) Resp:  [16-18] 16  (10/04 0454) BP: (142-159)/(50-54) 142/54 mmHg (10/04 0454) SpO2:  [93 %-95 %] 95 % (10/04 0454) Weight change:  Last BM Date: 01/19/12 Intake/Output from previous day: +1080 10/03 0701 - 10/04 0700 In: 1080 [P.O.:1080] Out: -  Intake/Output this shift:    PE: General:alert and oriented, pleasant affect, agreeable to PPM Heart:S1S2 irreg, with pauses Lungs:clear without rales or rhonchi Abd:+ Bs soft non tender Ext:no edema    Lab Results:  Basename 01/19/12 1535  WBC 9.0  HGB 12.6*  HCT 37.1*  PLT 245   BMET  Basename 01/20/12 0535 01/19/12 1535  NA 143 142  K 3.9 3.6  CL 104 104  CO2 32 31  GLUCOSE 111* 124*  BUN 12 13  CREATININE 0.85 0.87  CALCIUM 9.5 9.4   No results found for this basename: TROPONINI:2,CK,MB:2 in the last 72 hours  Lab Results  Component Value Date   CHOL 159 03/02/2011   HDL 65.60 03/02/2011   LDLCALC 84 03/02/2011   LDLDIRECT 98.3 07/21/2006   TRIG 49.0 03/02/2011   CHOLHDL 2 03/02/2011   No results found for this basename: HGBA1C     Lab Results  Component Value Date   TSH 2.71 03/02/2011     EKG: Orders placed during the hospital encounter of 01/19/12  . EKG 12-LEAD  . EKG 12-LEAD  . EKG 12-LEAD  . EKG 12-LEAD  . EKG 12-LEAD    Studies/Results: No results found.  Medications: I have reviewed the patient's current medications. Scheduled Meds:    . aspirin EC  325 mg Oral Daily  . carbidopa-levodopa  2 tablet Oral Custom  . docusate  100 mg Oral BID  . docusate sodium      . hydrochlorothiazide  12.5 mg Oral Daily  . trihexyphenidyl  2 mg Oral BID WC  . DISCONTD: trihexyphenidyl  2 mg Oral BID WC   Continuous Infusions:  PRN Meds:.pantoprazole, polyethylene  glycol   Assessment/Plan: Principal Problem:  *Symptomatic bradycardia, increased fatigue, difficulty staying awake, 2:1 AV block and bifascicular blaock Active Problems:  HYPERLIPIDEMIA  Dementia in Parkinson's disease  Parkinson disease  HYPERTENSION  CORONARY ARTERY DISEASE --  s/p LIMA-LAD & PCI to LCx  Aortic valve stenosis, acquired -- s/p AVR with Porcie Bioprosthetic AVR; By Echo in 05/2009 moderate stenosis,AVA 0.98cm.  Right renal artery stenosis - 90%  Heart block AV second degree -- 2:1  PLAN:  HR has not improved with stopping of bradycardia.  Plan per Dr. Lubertha Basque note PPM today.  He is on clear liquids for BK.   LOS: 2 days   Ian [name],Ian [name] 01/21/2012, 8:12 AM   Agree with note written by Nada Boozer RNP  High grade symptomatic AVB. For PTVPM today.   Runell Gess 01/21/2012 8:39 AM

## 2012-01-21 NOTE — Progress Notes (Signed)
01/21/2012 1015 Nursing note  Paged Nada Boozer NP regarding pt. Scheduled 325 mg ASA pre procedure.  Verbal orders received ok to give med as scheduled. Will continue to monitor.  Calandra Madura, Blanchard Kelch

## 2012-01-21 NOTE — Op Note (Signed)
SURGEON:  Hillis Range, MD     PREPROCEDURE DIAGNOSIS:  Symptomatic complete heart block    POSTPROCEDURE DIAGNOSIS:  Symptomatic complete heart block     PROCEDURES:   1. Left upper extremity venography.   2. Pacemaker implantation.     INTRODUCTION: Ian [name] is a 78 y.o. male  with a history of complete heart block who presents today for pacemaker implantation.  The patient reports intermittent episodes of dizziness over the past few months.  No reversible causes have been identified.  The patient therefore presents today for pacemaker implantation.     DESCRIPTION OF PROCEDURE:  Informed written consent was obtained, and the patient was brought to the electrophysiology lab in a fasting state.  The patient required no sedation for the procedure today.  The patients left chest was prepped and draped in the usual sterile fashion by the EP lab staff. The skin overlying the left deltopectoral region was infiltrated with lidocaine for local analgesia.  A 4-cm incision was made over the left deltopectoral region.  A left subcutaneous pacemaker pocket was fashioned using a combination of sharp and blunt dissection. Electrocautery was required to assure hemostasis.    Left Upper Extremity Venography: A venogram of the left upper extremity was performed, which revealed a large left cephalic vein, which emptied into a large left subclavian vein.  The left axillary vein was moderate in size.    RA/RV Lead Placement: The left axillary vein was therefore directly visualized and cannulated.  Through the left axillary vein, a Medtronic model [id number] (serial number [id number]) right atrial lead and a Medtronic model 5076- 58 (serial number [id number]) right ventricular lead were advanced with fluoroscopic visualization into the right atrial appendage and right ventricular apex positions respectively.  Initial atrial lead P- waves measured 1.4 mV with impedance of 752 ohms and a threshold of 1.1 V at 0.5  msec.  Right ventricular lead R-waves measured 7 mV with an impedance of 662 ohms and a threshold of 0.5 V at 0.5 msec.  Both leads were secured to the pectoralis fascia using #2-0 silk over the suture sleeves.   Device Placement:  The leads were then connected to a Medtronic Adapta L model ADDRL 1 (serial number [id number]) pacemaker.  The pocket was irrigated with copious gentamicin solution.  The pacemaker was then placed into the pocket.  The pocket was then closed in 2 layers with 2.0 Vicryl suture for the subcutaneous and subcuticular layers.  Steri- Strips and a sterile dressing were then applied.  There were no early apparent complications.     CONCLUSIONS:   1. Successful implantation of a Medtronic Adapta L dual-chamber pacemaker for symptomatic complete heart block  2. No early apparent complications.           Hillis Range, MD 01/21/2012 5:49 PM

## 2012-01-21 NOTE — Progress Notes (Signed)
01/21/2012 11:53 AM Nursing note Pt. And family viewed video #118 on pacemakers. Questions and concerns addressed.  Alek Borges, Blanchard Kelch

## 2012-01-21 NOTE — Progress Notes (Signed)
   ELECTROPHYSIOLOGY ROUNDING NOTE    Patient Name: Ian [name] Date of Encounter: 01-21-2012    SUBJECTIVE:Patient admitted 01-19-2012 for symptomatic bradycardia from Dr Hazle Coca office.  He was on Bisoprolol which has now been held for 48 hours.  His heart rate is maintaining in the 30's with high grade heart block.  The patient reports decreased exercise tolerance and fatigue over the past couple of weeks.  Dr Ladona Ridgel consulted on the patient 01-20-2012 and recommended pacemaker implantation today if heart rates did not improve.      TELEMETRY: Reviewed telemetry pt in high grade heart block, ventricular rates 30's Filed Vitals:   01/20/12 0334 01/20/12 1350 01/20/12 1906 01/21/12 0454  BP: 166/80 146/50 159/51 142/54  Pulse: 31 50 36 31  Temp: 97.9 F (36.6 C) 97.3 F (36.3 C) 97.9 F (36.6 C) 98.5 F (36.9 C)  TempSrc: Oral Oral Oral Oral  Resp: 16 18 18 16   Height:      Weight:      SpO2: 94% 95% 93% 95%    Intake/Output Summary (Last 24 hours) at 01/21/12 0842 Last data filed at 01/20/12 1900  Gross per 24 hour  Intake    840 ml  Output      0 ml  Net    840 ml    LABS: Basic Metabolic Panel:  Basename 01/20/12 0535 01/19/12 1535  NA 143 142  K 3.9 3.6  CL 104 104  CO2 32 31  GLUCOSE 111* 124*  BUN 12 13  CREATININE 0.85 0.87  CALCIUM 9.5 9.4  MG -- 2.1  PHOS -- --   CBC:  Basename 01/19/12 1535  WBC 9.0  NEUTROABS --  HGB 12.6*  HCT 37.1*  MCV 90.9  PLT 245    [facility] requests that we do wound check appointment.  Scheduled for 01-31-2012 at 11:30AM.  Patient will follow with Dr Allyson Sabal after that for pacemaker follow up.   Assessment and Plan  Pt with persistent mobitz II second degree AV block.  No reversible causes found as AV block persists off of av nodal agents >48 hours. I would therefore recommend pacemaker implantation at this time.  Risks, benefits, alternatives to pacemaker implantation were discussed in detail with the patient today. The  patient understands that the risks include but are not limited to bleeding, infection, pneumothorax, perforation, tamponade, vascular damage, renal failure, MI, stroke, death,  and lead dislodgement and wishes to proceed. We will therefore schedule the procedure today. Fayrene Fearing Pratham Cassatt,MD

## 2012-01-22 ENCOUNTER — Encounter (HOSPITAL_COMMUNITY): Payer: Self-pay | Admitting: Cardiology

## 2012-01-22 ENCOUNTER — Inpatient Hospital Stay (HOSPITAL_COMMUNITY): Payer: Medicare Other

## 2012-01-22 DIAGNOSIS — Z950 Presence of cardiac pacemaker: Secondary | ICD-10-CM | POA: Diagnosis not present

## 2012-01-22 DIAGNOSIS — I2510 Atherosclerotic heart disease of native coronary artery without angina pectoris: Secondary | ICD-10-CM | POA: Diagnosis not present

## 2012-01-22 DIAGNOSIS — I442 Atrioventricular block, complete: Secondary | ICD-10-CM | POA: Diagnosis not present

## 2012-01-22 MED ORDER — HYDROCODONE-ACETAMINOPHEN 5-325 MG PO TABS: 1.0000 | ORAL_TABLET | ORAL | Status: DC | PRN

## 2012-01-22 MED ORDER — BISOPROLOL FUMARATE 5 MG PO TABS
5.0000 mg | ORAL_TABLET | Freq: Every day | ORAL | Status: DC
  Administered 2012-01-22: 5 mg via ORAL
  Filled 2012-01-22: qty 1

## 2012-01-22 NOTE — Progress Notes (Signed)
Subjective: No complaints, HR up to 120 BPM, appears ST with ventricular pacing.  Will resume BB.  Objective: Vital signs in last 24 hours: Temp:  [97.4 F (36.3 C)-97.7 F (36.5 C)] 97.4 F (36.3 C) (10/05 0518) Pulse Rate:  [36-70] 69  (10/05 0518) Resp:  [18] 18  (10/05 0518) BP: (145-172)/(56-74) 145/69 mmHg (10/05 0518) SpO2:  [92 %-95 %] 92 % (10/05 0518) Weight change:  Last BM Date: 01/19/12 Intake/Output from previous day: +840 10/04 0701 - 10/05 0700 In: 840 [P.O.:840] Out: -  Intake/Output this shift:    PE: General: Heart: Lungs: Abd: Ext:    Lab Results:  Basename 01/19/12 1535  WBC 9.0  HGB 12.6*  HCT 37.1*  PLT 245   BMET  Basename 01/20/12 0535 01/19/12 1535  NA 143 142  K 3.9 3.6  CL 104 104  CO2 32 31  GLUCOSE 111* 124*  BUN 12 13  CREATININE 0.85 0.87  CALCIUM 9.5 9.4   No results found for this basename: TROPONINI:2,CK,MB:2 in the last 72 hours  Lab Results  Component Value Date   CHOL 159 03/02/2011   HDL 65.60 03/02/2011   LDLCALC 84 03/02/2011   LDLDIRECT 98.3 07/21/2006   TRIG 49.0 03/02/2011   CHOLHDL 2 03/02/2011   No results found for this basename: HGBA1C     Lab Results  Component Value Date   TSH 2.71 03/02/2011    Hepatic Function Panel No results found for this basename: PROT,ALBUMIN,AST,ALT,ALKPHOS,BILITOT,BILIDIR,IBILI in the last 72 hours No results found for this basename: CHOL in the last 72 hours No results found for this basename: PROTIME in the last 72 hours    EKG: Orders placed during the hospital encounter of 01/19/12  . EKG 12-LEAD  . EKG 12-LEAD  . EKG 12-LEAD  . EKG 12-LEAD  . EKG 12-LEAD  . EKG  . EKG 12-LEAD  . EKG 12-LEAD  . EKG 12-LEAD    Studies/Results: Dg Chest 2 View  01/22/2012  *RADIOLOGY REPORT*  Clinical Data: Post pacemaker placement.  CHEST - 2 VIEW  Comparison: 01/21/2012.  Findings: Interval left subclavian pacemaker placement with leads in the right atrium and right  ventricle.  The heart size and mediastinal contours are stable status post aortic valve replacement.  The lungs are clear.  There is no pleural effusion or pneumothorax.  No acute osseous findings are seen.  IMPRESSION: Pacemaker placement as described.  No demonstrated complication.   Original Report Authenticated By: Gerrianne Scale, M.D.    Dg Chest 2 View  01/21/2012  *RADIOLOGY REPORT*  Clinical Data: 78 year old male preoperative study for pacemaker implantation.  CHEST - 2 VIEW  Comparison: 10/27/2009.  Findings: Sequelae median sternotomy.  Sequelae of cardiac valve replacement.  Mild cardiomegaly.  Stable tortuosity of the thoracic aorta. Other mediastinal contours are within normal limits.  No pneumothorax, pulmonary edema, pleural effusion or confluent pulmonary opacity. No acute osseous abnormality identified. Calcified atherosclerosis of the aorta.  IMPRESSION: No acute cardiopulmonary abnormality.   Original Report Authenticated By: Harley Hallmark, M.D.     Medications: I have reviewed the patient's current medications. Scheduled Meds:    . aspirin EC  325 mg Oral Daily  . bisoprolol  5 mg Oral Daily  . carbidopa-levodopa  2 tablet Oral Custom  . chlorhexidine  60 mL Topical Once  . chlorhexidine  60 mL Topical Once  . docusate  100 mg Oral BID  . heparin      . hydrochlorothiazide  12.5 mg Oral Daily  . lidocaine      . trihexyphenidyl  2 mg Oral BID WC  . vancomycin  1,000 mg Intravenous Q12H  . DISCONTD: gentamicin irrigation  80 mg Irrigation On Call  . DISCONTD: sodium chloride  3 mL Intravenous Q12H  . DISCONTD: vancomycin  1,000 mg Intravenous On Call   Continuous Infusions:    . DISCONTD: sodium chloride 50 mL/hr at 01/21/12 1103   PRN Meds:.acetaminophen, HYDROcodone-acetaminophen, ondansetron (ZOFRAN) IV, pantoprazole, polyethylene glycol, DISCONTD: sodium chloride  Assessment/Plan: Principal Problem:  *Symptomatic bradycardia, increased fatigue, difficulty  staying awake, complete heart block  Active Problems:  HYPERLIPIDEMIA  Dementia in Parkinson's disease  Parkinson disease  HYPERTENSION  CORONARY ARTERY DISEASE --  s/p LIMA-LAD & PCI to LCx  Aortic valve stenosis, acquired -- s/p AVR with Porcie Bioprosthetic AVR; By Echo in 05/2009 moderate stenosis,AVA 0.98cm.  Right renal artery stenosis - 90%  Heart block AV second degree -- 2:1  PLAN: restart BB, ambulate, ? D/c home today,  No pneumothorax on CXR  LOS: 3 days   Sejal Cofield R 01/22/2012, 9:36 AM

## 2012-01-22 NOTE — Progress Notes (Signed)
Pt. Seen and examined. Agree with the NP/PA-C note as written.  Feels well today. Exam is benign, pacer site is tightly dressed but does not demonstrate hematoma. He has coarse extremity tremor which is baseline, lungs clear, CXR does not show PTX. Pacer interrogated today and shows normal function. He is in sinus tachy and would benefit from resumption of his b-blocker. Ok for discharge home today. Follow-up with Dr. Johney Frame as scheduled for site check on 01/31/12 at 11:00 am and then Dr. Allyson Sabal thereafter.  Chrystie Nose, MD, [facility] Attending Cardiologist [facility]

## 2012-01-22 NOTE — Discharge Summary (Signed)
Physician Discharge Summary  Patient ID: Ian [name] MRN: [id number] DOB/AGE: 76-Dec-1930 76 y.o.  Admit date: 01/19/2012 Discharge date: 01/22/2012  Discharge Diagnoses:  Principal Problem:  *Symptomatic bradycardia, increased fatigue, difficulty staying awake, complete heart block  Active Problems:  HYPERLIPIDEMIA  Dementia in Parkinson's disease  Parkinson disease  HYPERTENSION  CORONARY ARTERY DISEASE --  s/p LIMA-LAD & PCI to LCx  Aortic valve stenosis, acquired -- s/p AVR with Porcie Bioprosthetic AVR; By Echo in 05/2009 moderate stenosis,AVA 0.98cm.  Right renal artery stenosis - 90%  Heart block AV second degree -- 2:1  S/P cardiac pacemaker procedure, Medtronic Adapta L  ADDRL1, 01/21/12   Discharged Condition: good  PROCEDURES: Placement of PPM--Medtronic Adapta, 10/4/`3 by Dr. Gertie Baron Course: Ian [name] is a 78 y.o. male with a PMH of CAD/AS s/p AVR&CABGx1 (LIMA-LAD, Porcine Bioprosthetic Valve) in 2001, BMS PCI to LCx in 2009, rRAS, HTN, HLD, & Parkinson's disease who was seen by Dr. Allyson Sabal @ [facility] for routine clinic follow-up & was found to be profoundly bradycardic on ECG with S Brady/SArrythmia & what appears to be 2:1 Mobitz AVB (in the setting of Bifascicular Block). He is completely stable & denies any light headedness, dizziness, syncope or near syncope symptoms. But he has been symptomatic with increased fatigue and difficulty staying awake for the last 1-2 months.    He has an Echo done in the office & was sent for direct admission / observation. He is currently lying in bed in NAD with TPM pads in place.   New ECHO 01/19/12 from our office: EF>55%- suggestion of restrictive physiology. LA is severly dilated, Moderate MR, Mod pul HTN with RV systolic Pressure of 40-50 mmHg Mild aortic reg.  On the second day of hospitalization with holding BB pt had junctional tachycardia for 6-8 beats.  We had EP- Dr. Ladona Ridgel see the pt. And the patient's HR had not  improved much holding the BB.  Pt. Was mostly 2:1 Block and at times CHB with HR in low 30's to 40's.   By 01/21/12 no change in HR Dr. Johney Frame recommended PPM.  Pt and his wife were agreeable to proceed.    Pt. Underwent Successful implantation of a Medtronic Adapta L dual-chamber pacemaker for symptomatic complete heart block and no early apparent complications.   By the morning  Of 01/22/12, pt had developed HR of 120, SR with ventricular pacing.  Ian [name] was resumed.  Pt unaware of increased HR.  Dr. Rennis Golden saw and felt pt. Ready for discharge.  CXR without pneumothorax.   Discharge instructions were reviewed with pt. And his wife.  He will need appt in 2-3 months with Dr. Marcelene Butte to adjust pacemaker.   At discharge: Atrial pacing Threshold                                       Atrial sensing threshold Auto Amplitude 0.500V/0.40                                  P-wave 4.58mV-5.60mV  Ventricular Pacing Threshold                              Ventricular sensing Threshold Auto Amplitude   0.5V@ 0.4 ms  R-wave  11.20 mV-15.68 mV  Pacing & sensing appropriately   Consults: cardiology--EP Dr. Ladona Ridgel and Dr. Johney Frame.  Significant Diagnostic Studies:  BMET    Component Value Date/Time   NA 143 01/20/2012 0535   K 3.9 01/20/2012 0535   CL 104 01/20/2012 0535   CO2 32 01/20/2012 0535   GLUCOSE 111* 01/20/2012 0535   BUN 12 01/20/2012 0535   CREATININE 0.85 01/20/2012 0535   CALCIUM 9.5 01/20/2012 0535   GFRNONAA 80* 01/20/2012 0535   GFRAA >90 01/20/2012 0535    CBC    Component Value Date/Time   WBC 9.0 01/19/2012 1535   RBC 4.08* 01/19/2012 1535   HGB 12.6* 01/19/2012 1535   HCT 37.1* 01/19/2012 1535   PLT 245 01/19/2012 1535   MCV 90.9 01/19/2012 1535   MCH 30.9 01/19/2012 1535   MCHC 34.0 01/19/2012 1535   RDW 13.1 01/19/2012 1535   LYMPHSABS 1.7 03/02/2011 1140   MONOABS 0.6 03/02/2011 1140   EOSABS 0.4 03/02/2011 1140   BASOSABS 0.1 03/02/2011 1140   CXR:   01/22/12  CHEST - 2 VIEW  Comparison: 01/21/2012.  Findings: Interval left subclavian pacemaker placement with leads  in the right atrium and right ventricle. The heart size and  mediastinal contours are stable status post aortic valve  replacement. The lungs are clear. There is no pleural effusion or  pneumothorax. No acute osseous findings are seen.  IMPRESSION:  Pacemaker placement as described. No demonstrated complication.    Discharge Exam: Blood pressure 145/69, pulse 69, temperature 97.4 F (36.3 C), temperature source Oral, resp. rate 18, height 5\' 8"  (1.727 m), weight 71.2 kg (156 lb 15.5 oz), SpO2 92.00%.   Exam is benign, pacer site is tightly dressed but does not demonstrate hematoma. He has coarse extremity tremor which is baseline, lungs clear, CXR does not show PTX. Pacer interrogated today and shows normal function  Disposition:  Home      Discharge Orders    Future Appointments: Provider: Department: Dept Phone: Center:   01/31/2012 11:30 AM [facility] [phone] [facility]   03/31/2012 10:30 AM Karie Schwalbe, MD [facility] [phone] [facility]       Medication List     As of 01/22/2012 11:48 AM    TAKE these medications         acetaminophen 650 MG CR tablet   Commonly known as: TYLENOL   Take 650 mg by mouth at bedtime.      aspirin 81 MG tablet   Take 2 tablets by mouth daily.      bisoprolol 5 MG tablet   Commonly known as: ZEBETA   Take 1 tablet (5 mg total) by mouth daily.      carbidopa-levodopa 25-100 MG per tablet   Commonly known as: SINEMET CR   Take 2 tablets by mouth 3 (three) times daily. Takes 2 tablets at 8am, 2 tablets at 2pm and 2 tablets at 8pm. (For a total of 6 tablets/day).      hydrochlorothiazide 12.5 MG capsule   Commonly known as: MICROZIDE   Take 1 capsule (12.5 mg total) by mouth daily.      HYDROcodone-acetaminophen 5-325 MG per tablet   Commonly known as:  NORCO/VICODIN   Take 1-2 tablets by mouth every 4 (four) hours as needed.      pantoprazole 40 MG tablet   Commonly known as: PROTONIX   Take 40 mg by mouth daily as needed. For acid reflux  trihexyphenidyl 2 MG tablet   Commonly known as: ARTANE   Take 2 mg by mouth 2 (two) times daily with a meal.        Follow-up Information    Follow up with Abelino Derrick, PA. On 02/03/2012. (at 9:30 am-- Dr. Hazle Coca PA.)    Contact information:   [address] [place] [address] [phone]        DISCHARGE INSTRUCTIONS: Supplemental Discharge Instructions for  Pacemaker Patients  Activity No heavy lifting (>10lbs) or vigorous activity with your affected arm for 4 to 6 weeks.  Do not raise your affected arm above your head for one week.  Gradually raise your affected arm as drawn below.          01/25/12                 01/26/12                         01/27/12                01/28/12  NO DRIVING for 1 week ; you may begin driving on  13/24/40. WOUND CARE   Keep the wound area clean and dry.  Do not get this area wet for one week. No showers for one week; you may shower on 01/28/12.   The tape/steri-strips on your wound will fall off; do not pull them off.  No bandage is needed on the site.  DO  NOT apply any creams, oils, or ointments to the wound area.   If you notice any drainage or discharge from the wound, any swelling or bruising at the site, or you develop a fever > 101? F after you are discharged home, call the office at once.  Special Instructions   You are still able to use cellular telephones; use the ear opposite the side where you have your pacemaker/defibrillator.  Avoid carrying your cellular phone near your device.   When traveling through airports, show security personnel your identification card to avoid being screened in the metal detectors.  Ask the security personnel to use the hand wand.   Avoid arc welding equipment, MRI testing (magnetic  resonance imaging), TENS units (transcutaneous nerve stimulators).  Call the office for questions about other devices.   Avoid electrical appliances that are in poor condition or are not properly grounded.   Microwave ovens are safe to be near or to operate.   SignedLeone Brand 01/22/2012, 11:48 AM

## 2012-01-22 NOTE — Progress Notes (Signed)
Patient discharge instructions reviewed with patient and wife, prescriptions given by Laren Everts, all questions answered. Patient discharged home.

## 2012-01-31 ENCOUNTER — Ambulatory Visit (INDEPENDENT_AMBULATORY_CARE_PROVIDER_SITE_OTHER): Payer: Medicare Other | Admitting: *Deleted

## 2012-01-31 ENCOUNTER — Encounter: Payer: Self-pay | Admitting: Internal Medicine

## 2012-01-31 DIAGNOSIS — I441 Atrioventricular block, second degree: Secondary | ICD-10-CM | POA: Diagnosis not present

## 2012-01-31 LAB — PACEMAKER DEVICE OBSERVATION

## 2012-01-31 NOTE — Progress Notes (Signed)
Wound check pacer in clinic  

## 2012-02-08 DIAGNOSIS — E782 Mixed hyperlipidemia: Secondary | ICD-10-CM | POA: Diagnosis not present

## 2012-02-08 DIAGNOSIS — I10 Essential (primary) hypertension: Secondary | ICD-10-CM | POA: Diagnosis not present

## 2012-02-08 DIAGNOSIS — I25810 Atherosclerosis of coronary artery bypass graft(s) without angina pectoris: Secondary | ICD-10-CM | POA: Diagnosis not present

## 2012-02-15 ENCOUNTER — Encounter: Payer: Self-pay | Admitting: Internal Medicine

## 2012-02-16 DIAGNOSIS — C44121 Squamous cell carcinoma of skin of unspecified eyelid, including canthus: Secondary | ICD-10-CM | POA: Diagnosis not present

## 2012-02-16 DIAGNOSIS — D0410 Carcinoma in situ of skin of unspecified eyelid, including canthus: Secondary | ICD-10-CM | POA: Diagnosis not present

## 2012-02-16 DIAGNOSIS — Z961 Presence of intraocular lens: Secondary | ICD-10-CM | POA: Diagnosis not present

## 2012-03-28 DIAGNOSIS — I498 Other specified cardiac arrhythmias: Secondary | ICD-10-CM | POA: Diagnosis not present

## 2012-03-28 DIAGNOSIS — I442 Atrioventricular block, complete: Secondary | ICD-10-CM | POA: Diagnosis not present

## 2012-03-28 DIAGNOSIS — I150 Renovascular hypertension: Secondary | ICD-10-CM | POA: Diagnosis not present

## 2012-03-29 ENCOUNTER — Ambulatory Visit (INDEPENDENT_AMBULATORY_CARE_PROVIDER_SITE_OTHER): Payer: Medicare Other | Admitting: Internal Medicine

## 2012-03-29 ENCOUNTER — Encounter: Payer: Self-pay | Admitting: Internal Medicine

## 2012-03-29 VITALS — BP 130/68 | HR 83 | Temp 97.9°F | Wt 156.0 lb

## 2012-03-29 DIAGNOSIS — G20 Parkinson's disease: Secondary | ICD-10-CM

## 2012-03-29 DIAGNOSIS — K5909 Other constipation: Secondary | ICD-10-CM

## 2012-03-29 DIAGNOSIS — Z1331 Encounter for screening for depression: Secondary | ICD-10-CM | POA: Diagnosis not present

## 2012-03-29 DIAGNOSIS — I10 Essential (primary) hypertension: Secondary | ICD-10-CM

## 2012-03-29 DIAGNOSIS — F0280 Dementia in other diseases classified elsewhere without behavioral disturbance: Secondary | ICD-10-CM | POA: Diagnosis not present

## 2012-03-29 DIAGNOSIS — K219 Gastro-esophageal reflux disease without esophagitis: Secondary | ICD-10-CM | POA: Diagnosis not present

## 2012-03-29 DIAGNOSIS — K5904 Chronic idiopathic constipation: Secondary | ICD-10-CM | POA: Insufficient documentation

## 2012-03-29 LAB — LIPID PANEL
Cholesterol: 163 mg/dL (ref 0–200)
HDL: 54.8 mg/dL (ref >39.00)
LDL Cholesterol: 93 mg/dL (ref 0–99)
Total CHOL/HDL Ratio: 3
Triglycerides: 75 mg/dL (ref 0.0–149.0)
VLDL: 15 mg/dL (ref 0.0–40.0)

## 2012-03-29 LAB — HEPATIC FUNCTION PANEL
AST: 12 U/L (ref 0–37)
Bilirubin, Direct: 0 mg/dL (ref 0.0–0.3)
Total Bilirubin: 0.6 mg/dL (ref 0.3–1.2)

## 2012-03-29 LAB — BASIC METABOLIC PANEL
BUN: 15 mg/dL (ref 6–23)
Calcium: 8.8 mg/dL (ref 8.4–10.5)
Chloride: 101 mEq/L (ref 96–112)
Creatinine, Ser: 0.8 mg/dL (ref 0.4–1.5)
GFR: 93.2 mL/min (ref >60.00)

## 2012-03-29 LAB — CBC WITH DIFFERENTIAL/PLATELET
Basophils Relative: 1 % (ref 0.0–3.0)
Eosinophils Relative: 4.8 % (ref 0.0–5.0)
Lymphocytes Relative: 21.4 % (ref 12.0–46.0)
MCV: 91 fl (ref 78.0–100.0)
Monocytes Absolute: 0.6 10*3/uL (ref 0.1–1.0)
Neutrophils Relative %: 66.2 % (ref 43.0–77.0)
Platelets: 296 10*3/uL (ref 150.0–400.0)
RBC: 4.42 Mil/uL (ref 4.22–5.81)
WBC: 9 10*3/uL (ref 4.5–10.5)

## 2012-03-29 LAB — TSH: TSH: 2.1 u[IU]/mL (ref 0.35–5.50)

## 2012-03-29 MED ORDER — BISOPROLOL-HYDROCHLOROTHIAZIDE 5-6.25 MG PO TABS: 1.0000 | ORAL_TABLET | Freq: Every day | ORAL | Status: DC

## 2012-03-29 NOTE — Assessment & Plan Note (Signed)
Advised to try miralax daily and cut back if too much

## 2012-03-29 NOTE — Patient Instructions (Signed)
Change to the combination blood pressure tab when it arrives Try miralax daily for your bowels and cut back (like to every other day) if you are going too often

## 2012-03-29 NOTE — Assessment & Plan Note (Signed)
Uses the pantoprazole only prn now

## 2012-03-29 NOTE — Assessment & Plan Note (Signed)
Slow progression of memory problems No loss of function over the past 6 months

## 2012-03-29 NOTE — Assessment & Plan Note (Signed)
BP Readings from Last 3 Encounters:  03/29/12 130/68  01/22/12 145/69  01/22/12 145/69   Good control Mild orthostatic symptoms Will decrease the HCTZ and put together with the bisoprolol

## 2012-03-29 NOTE — Assessment & Plan Note (Signed)
Has had slow progression of disability Follows at [facility] still More problems at night---?higher dose of the CR in evening?

## 2012-03-29 NOTE — Progress Notes (Signed)
Subjective:    Patient ID: Matilde Bash, male    DOB: Sep 04, 1928, 78 y.o.   MRN: [id number]  HPI Here with wife Doing "okay" Still having problems falling--yesterday he bruised left cheek. Missed a step so he fell forward Usually falls out of home No functional changes with the Parkinson's Memory is worsening. Has given up driving for the most part (limits to a few miles and this is rare) Does all his personal care and helps maintain household (instrumental ADLs) Does got to fitness center regularly  Pacemaker was adjusted yesterday He is pretty much used to having this now Place about 2 months ago  Does have regular dizziness Very brief--usually upon first standing up. Occasionally occurs with prolonged walking  Blood pressure seems to be fine No chest pain No SOB  Current Outpatient Prescriptions on File Prior to Visit  Medication Sig Dispense Refill  . bisoprolol (ZEBETA) 5 MG tablet Take 1 tablet (5 mg total) by mouth daily.  90 tablet  3  . carbidopa-levodopa (SINEMET CR) 25-100 MG per tablet Takes 2 tablets at 8am, 2 tablets at 2pm and 2 tablets at 8pm. (For a total of 6 tablets/day).      . hydrochlorothiazide (MICROZIDE) 12.5 MG capsule Take 1 capsule (12.5 mg total) by mouth daily.  90 capsule  3  . trihexyphenidyl (ARTANE) 2 MG tablet Take 2 mg by mouth 2 (two) times daily with a meal.         Allergies  Allergen Reactions  . Amlodipine Besylate     REACTION: leg pain  . Carbidopa W-Levodopa     REACTION: drowsiness, bad dreams  . Penicillins     REACTION: rash  . Pramipexole Dihydrochloride     REACTION: drownsiness,bad dreams  . Simvastatin     REACTION: dizziness    Past Medical History  Diagnosis Date  . CAD (coronary artery disease) CABG x1 2001; PCI 2008    s/p LIMA-LAD in2001 with AVR; BMS to LCx 8/08  . Allergy   . Diverticulitis   . GERD (gastroesophageal reflux disease)   . Hypertension   . Osteoarthritis   . Osteoporosis   . MALT  lymphoma   . Parkinson's disease   . ASCVD (arteriosclerotic cardiovascular disease) 06/08    TIA  . Hyperlipidemia   . ED (erectile dysfunction)   . Aortic valve stenosis, acquired 2001    s/p Porcine AVR; by 05/2009 Echo - EF 45-50%, moderate AS -- AVA 0.98 cm2, peak gradient  . Right renal artery stenosis 2012    90% stenosis - stable  . S/P cardiac pacemaker procedure, Medtronic Adapta L  ADDRL1, 01/21/12 01/22/2012    Past Surgical History  Procedure Date  . Coronary artery bypass graft 2001    Zenaida Niece trigt ) aortic valve replacement 2001; LIMA-LAD.  Marland Kitchen Esophagogastroduodenoscopy     multiple, Colon/ EGD benign 11/2004  . Pilonidal cyst / sinus excision 1953  . Inguinal hernia repair     LIH 1997Llap. bilateral hernias 1998  . Coronary stent placement 11/2007    8/09  Left Circumflex Stent by Dr Rosie Fate started and aggrenox stopped  . Cataract extraction 06/09    left  . Laparoscopic cholecystectomy 07/11    Dr.Rosenbower  . Squamous cell carcinoma excision 3/13    back  . Aortic valve replacement 2001    Porcine valve    Family History  Problem Relation Age of Onset  . Heart disease Brother     History  Social History  . Marital Status: Married    Spouse Name: N/A    Number of Children: 3  . Years of Education: N/A   Occupational History  . retired- [employer]   . part-time sub/courier for schools- now only rarely    Social History Main Topics  . Smoking status: Never Smoker   . Smokeless tobacco: Never Used  . Alcohol Use: Yes     Comment: very rare  . Drug Use: No  . Sexually Active: No   Other Topics Concern  . Not on file   Social History Narrative  . No narrative on file   Review of Systems Appetite is "off" a bit---weight is stable Sleep is still not going well---sinemet seems to wear off. Awakens ~3-4AM     Objective:   Physical Exam  Constitutional: He appears well-developed and well-nourished. No distress.  Neck:  Normal range of motion.  Cardiovascular: Normal rate, regular rhythm and normal heart sounds.  Exam reveals no gallop.   No murmur heard. Pulmonary/Chest: Effort normal and breath sounds normal. No respiratory distress. He has no wheezes. He has no rales.  Lymphadenopathy:    He has no cervical adenopathy.  Neurological:       Moderate hand tremors Some increased tone  Psychiatric: He has a normal mood and affect. His behavior is normal.          Assessment & Plan:

## 2012-03-30 ENCOUNTER — Encounter: Payer: Self-pay | Admitting: *Deleted

## 2012-03-31 ENCOUNTER — Ambulatory Visit: Payer: Medicare Other | Admitting: Internal Medicine

## 2012-04-04 ENCOUNTER — Other Ambulatory Visit (HOSPITAL_COMMUNITY): Payer: Self-pay | Admitting: Cardiology

## 2012-04-04 DIAGNOSIS — I701 Atherosclerosis of renal artery: Secondary | ICD-10-CM

## 2012-04-27 ENCOUNTER — Ambulatory Visit (HOSPITAL_COMMUNITY)
Admission: RE | Admit: 2012-04-27 | Discharge: 2012-04-27 | Disposition: A | Discharge status: Home / Self Care | Payer: Medicare Other | Source: Ambulatory Visit | Attending: Cardiovascular Disease | Admitting: Cardiovascular Disease

## 2012-04-27 DIAGNOSIS — I701 Atherosclerosis of renal artery: Secondary | ICD-10-CM | POA: Diagnosis not present

## 2012-04-27 DIAGNOSIS — I10 Essential (primary) hypertension: Secondary | ICD-10-CM | POA: Diagnosis not present

## 2012-04-27 NOTE — Progress Notes (Signed)
Renal Duplex Completed. Ian [name] D  

## 2012-05-24 ENCOUNTER — Telehealth: Payer: Self-pay

## 2012-05-24 DIAGNOSIS — G20 Parkinson's disease: Secondary | ICD-10-CM

## 2012-05-24 NOTE — Telephone Encounter (Signed)
Mrs Steiner left v/m requesting referral to Dr Jim Desanctis; has already discussed with Dr Alphonsus Sias. Please advise.

## 2012-06-05 ENCOUNTER — Encounter: Payer: Self-pay | Admitting: Neurology

## 2012-06-05 ENCOUNTER — Ambulatory Visit (INDEPENDENT_AMBULATORY_CARE_PROVIDER_SITE_OTHER): Payer: Medicare Other | Admitting: Neurology

## 2012-06-05 VITALS — BP 112/76 | HR 84 | Temp 97.5°F | Resp 18 | Wt 151.0 lb

## 2012-06-05 DIAGNOSIS — G20 Parkinson's disease: Secondary | ICD-10-CM

## 2012-06-05 MED ORDER — CARBIDOPA-LEVODOPA ER 50-200 MG PO TBCR: 1.0000 | EXTENDED_RELEASE_TABLET | Freq: Every day | ORAL | Status: DC

## 2012-06-05 NOTE — Patient Instructions (Addendum)
1.  Rancho recipe:  1 cup bran, 2 cups applesauce in 1 cup of prune juice.  Use this for constipation and may use Miralax daily. 2.  Take carbidopa/levodopa CR 25/100:  2 pills at 7am, noon, 4pm and 8 pm.  Add the new medication carbidopa/levodopa 50/200 at bedtime (11pm) 3.  Continue with exercise

## 2012-06-05 NOTE — Progress Notes (Signed)
Ian [name] was seen today in the movement disorders clinic for neurologic consultation at the request of Ian Abide, MD.  The consultation is for the evaluation of PD.  The patient was previously seen at [facility] but I don't have any of those records.  This patient is accompanied in the office by his spouse who supplements the history.   The first symptom(s) the patient noticed was tremor in the R hand when he would use the hand or have a cup of coffee in it.  His wife believes that this began in 2004.  He was referred to Ian. Lance [name].  He tried some medication and according to his wife, he kept having adverse SE.  He was on mirapex and sinemet but reported dizziness and bad dreams with both.   He was referred to Ian. Harrel [name] in about 2010 because he was interested in DBS but the patient was concerned about risks of the surgery and potential side effects.    The patient is currently on Sinemet CR 25/100, 2 po qid and was on Artane, 2mg , 2 po bid.  He stopped the Artane about a month ago because of GI upset.  The Artane helped the tremor previously.  He takes his levodopa at 7am, 12-1, 8-9pm and 3-4 am.    Specific Symptoms:  Tremor: yes, R greater than L but both hands involved.  There is no tremor in the legs. Voice: some change, seems to have intermittent weakness in the voice Sleep: some trouble because awakens in the middle of the night needing the levodopa.  He will awaken and 2 more tablets of levodopa.  He will having cramping of the legs in the middle of the night.  Vivid Dreams:  no but had these in the past and pt attributed these to medication SE  Acting out dreams:  no Wet Pillows: yes Postural symptoms:  yes  Falls?  yes, 2 in 2013, one requiring stitches in head and one fell over curb.  No fx.  Uses no ambulatory assistive device Bradykinesia symptoms: slow movements, slowed thought processes and difficulty regaining balance Loss of smell:  yes Loss of taste:  yes Urinary  Incontinence:  no Difficulty Swallowing:  yes Handwriting, micrographia: yes Trouble with ADL's:  no  Trouble buttoning clothing: yes Depression:  no but has had some minor depression in the past Memory changes:  yes; trouble with word finding; Pt drives very rarely, no MVA's; able to distribute medication to himself; wife does most of finances that are not on autodraft but pt/son do taxes Hallucinations:  yes but rarely  visual distortions: yes N/V:  no Lightheaded:  no (one time in Aug, 2013 but was pre-pacemaker and related to fall)  Syncope: no  Diplopia:  yes (rarely and relates to bifocals) Dyskinesia:  no  Neuroimaging has previously been performed.  It is not available for my review today.  PREVIOUS MEDICATIONS: Sinemet (reported bad dreams/dizziness on regular formulation and same with Mirapex); requip;  on Sinemet CR currently.  ALLERGIES:   Allergies  Allergen Reactions  . Amlodipine Besylate     REACTION: leg pain  . Penicillins     REACTION: rash  . Pramipexole Dihydrochloride     REACTION: drownsiness,bad dreams  . Simvastatin     REACTION: dizziness    CURRENT MEDICATIONS:  Current Outpatient Prescriptions on File Prior to Visit  Medication Sig Dispense Refill  . aspirin 325 MG tablet Take 325 mg by mouth daily.      Marland Kitchen  bisoprolol-hydrochlorothiazide (ZIAC) 5-6.25 MG per tablet Take 1 tablet by mouth daily.  90 tablet  3  . carbidopa-levodopa (SINEMET CR) 25-100 MG per tablet Takes 2 tablets at 8am, 2 tablets at 2pm and 2 tablets at 8pm. (For a total of 6 tablets/day).      . pantoprazole (PROTONIX) 40 MG tablet Take 40 mg by mouth daily as needed.      . polyethylene glycol (MIRALAX / GLYCOLAX) packet Take 17 g by mouth daily.      . trihexyphenidyl (ARTANE) 2 MG tablet Take 2 mg by mouth 2 (two) times daily with a meal.        No current facility-administered medications on file prior to visit.    PAST MEDICAL HISTORY:   Past Medical History  Diagnosis  Date  . CAD (coronary artery disease) CABG x1 2001; PCI 2008    s/p LIMA-LAD in2001 with AVR; BMS to LCx 8/08  . Allergy   . Diverticulitis   . GERD (gastroesophageal reflux disease)   . Hypertension   . Osteoarthritis   . Osteoporosis   . MALT lymphoma   . Parkinson's disease   . ASCVD (arteriosclerotic cardiovascular disease) 06/08    TIA  . Hyperlipidemia   . ED (erectile dysfunction)   . Aortic valve stenosis, acquired 2001    s/p Porcine AVR; by 05/2009 Echo - EF 45-50%, moderate AS -- AVA 0.98 cm2, peak gradient  . Right renal artery stenosis 2012    90% stenosis - stable  . S/P cardiac pacemaker procedure, Medtronic Adapta L  ADDRL1, 01/21/12 01/22/2012    PAST SURGICAL HISTORY:   Past Surgical History  Procedure Laterality Date  . Coronary artery bypass graft  2001    Ian [name] trigt ) aortic valve replacement 2001; LIMA-LAD.  Marland Kitchen Esophagogastroduodenoscopy      multiple, Colon/ EGD benign 11/2004  . Pilonidal cyst / sinus excision  1953  . Inguinal hernia repair      LIH 1997Llap. bilateral hernias 1998  . Coronary stent placement  11/2007    8/09  Left Circumflex Stent by Ian [name] started and aggrenox stopped  . Cataract extraction  06/09    left  . Laparoscopic cholecystectomy  07/11    Ian.Rosenbower  . Squamous cell carcinoma excision  3/13    back  . Aortic valve replacement  2001    Porcine valve    SOCIAL HISTORY:   History   Social History  . Marital Status: Married    Spouse Name: N/A    Number of Children: 3  . Years of Education: N/A   Occupational History  . retired- [employer]   . part-time sub/courier for schools- now only rarely    Social History Main Topics  . Smoking status: Former Smoker    Types: Cigarettes    Quit date: 06/05/1961  . Smokeless tobacco: Never Used  . Alcohol Use: Yes     Comment: very rare  . Drug Use: No  . Sexually Active: No   Other Topics Concern  . Not on file   Social History Narrative   . No narrative on file    FAMILY HISTORY:   Family Status  Relation Status Death Age  . Mother Deceased 46    old age  . Father Deceased 68    acute bronchitis, cotton exposure  . Sister Other   . Brother Deceased   . Daughter Deceased     osteosarcoma  . Brother  Other   . Brother Other     ROS:  Has problems with chronic constipation.  Has been on metamucil and dulcolax without significant relief.  He has tried miralax but only 2 times.  Been on fibercon.  A complete 10 system review of systems was obtained and was unremarkable apart from what is mentioned above.  PHYSICAL EXAMINATION:    VITALS:   Filed Vitals:   06/05/12 0913  BP: 112/76  Pulse: 84  Temp: 97.5 F (36.4 C)  Resp: 18  Weight: 151 lb (68.493 kg)    GEN:  The patient appears stated age and is in NAD. HEENT:  Normocephalic, atraumatic.  The mucous membranes are moist. The superficial temporal arteries are without ropiness or tenderness. CV:  RRR Lungs:  CTAB Neck/HEME:  There are no carotid bruits bilaterally.  Neurological examination:  Orientation: A MoCA was performed today and the patient scored a 20/30. Cranial nerves: There is good facial symmetry. Pupils are equal round and reactive to light bilaterally. There are no square wave jerks.  Fundoscopic exam is attempted but the disc margins are not well visualized bilaterally.Extraocular muscles are intact. The visual fields are full to confrontational testing. The speech is fluent and clear. Soft palate rises symmetrically and there is no tongue deviation. Hearing is intact to conversational tone. Sensation: Sensation is intact to light and pinprick throughout (facial, trunk, extremities). Vibration is decreased at the bilateral big toe and in a distal fashion. There is no extinction with double simultaneous stimulation. There is no sensory dermatomal level identified. Motor: Strength is 5/5 in the bilateral upper and lower extremities.   Shoulder  shrug is equal and symmetric.  There is no pronator drift. Deep tendon reflexes: Deep tendon reflexes are 2/4 at the bilateral biceps, triceps, brachioradialis, patella and absent at the bilateral achilles. Plantar responses are downgoing bilaterally.  Movement examination: Tone: There is mild increased tone in the bilateral upper extremities that increases with activation procedures.  The tone in the lower extremities is normal.  Abnormal movements: There is a moderate RUE resting tremor and mild-mod LUE resting tremor.  No dyskinesia. Coordination:  There is miminal decremation with RAM's, seen mostly with hand opening and closing on the R Gait and Station: The patient has no difficulty arising out of a deep-seated chair without the use of the hands. The patient's stride length is normal.  The patient has a negative pull test.      LABS:  Lab Results  Component Value Date   WBC 9.0 03/29/2012   HGB 13.6 03/29/2012   HCT 40.3 03/29/2012   MCV 91.0 03/29/2012   PLT 296.0 03/29/2012     Chemistry      Component Value Date/Time   NA 140 03/29/2012 1138   K 4.2 03/29/2012 1138   CL 101 03/29/2012 1138   CO2 32 03/29/2012 1138   BUN 15 03/29/2012 1138   CREATININE 0.8 03/29/2012 1138      Component Value Date/Time   CALCIUM 8.8 03/29/2012 1138   ALKPHOS 60 03/29/2012 1138   AST 12 03/29/2012 1138   ALT 3 03/29/2012 1138   BILITOT 0.6 03/29/2012 1138     Lab Results  Component Value Date   VITAMINB12 273 04/30/2008   Lab Results  Component Value Date   TSH 2.10 03/29/2012     ASSESSMENT/PLAN:  1.  Idiopathic Parkinsons disease.    -We discussed the diagnosis as well as pathophysiology of the disease.  We discussed treatment options  as well as prognostic indicators.  Patient education was provided.  -I would like to get notes from Ian. Ludger Nutting and Ian. Lance [name].  -I agree with the d/c of Artane as this can cause increased confusion, constipation and falls in this  population.  -I am going to change the way that the levodopa is dosed.  He did not tolerate the IR version so I will leave him on the CR and have him take 25/100 CR, 2 tabs at 7am, noon, 4 pm and 8 pm and he will take a single 50/200 at bedtime at 11 pm which will hopefully help the nighttime awakenings and cramping.  -I talked with him about the potential addition of entacapone in the near future. 2.  Mild cognitive impairment, likely from longstanding PD.  -He may benefit from an acetylcholinesterase in the future such as Exelon patch.  We will wait on starting this because I am starting other medication today.  -I will recheck his B12 next visit.  -I. Will try to get a copy of his prior neuroimaging from [facility]. 3.  Constipation.  -This is very common in patients with Parkinson's disease.  I encouraged continual exercise.  I gave him a copy of the [place]o recipe for constipation.  I asked him to try Miralax as well. 4.  Return in about 3 months (around 09/02/2012). 5.  Time in the room with the patient was 80 min.

## 2012-08-17 DIAGNOSIS — Z961 Presence of intraocular lens: Secondary | ICD-10-CM | POA: Diagnosis not present

## 2012-08-17 DIAGNOSIS — H3530 Unspecified macular degeneration: Secondary | ICD-10-CM | POA: Diagnosis not present

## 2012-08-17 DIAGNOSIS — H16109 Unspecified superficial keratitis, unspecified eye: Secondary | ICD-10-CM | POA: Diagnosis not present

## 2012-08-17 DIAGNOSIS — H04129 Dry eye syndrome of unspecified lacrimal gland: Secondary | ICD-10-CM | POA: Diagnosis not present

## 2012-09-04 ENCOUNTER — Encounter: Payer: Self-pay | Admitting: Neurology

## 2012-09-04 ENCOUNTER — Ambulatory Visit (INDEPENDENT_AMBULATORY_CARE_PROVIDER_SITE_OTHER): Payer: Medicare Other | Admitting: Neurology

## 2012-09-04 VITALS — BP 122/76 | HR 80 | Temp 97.6°F | Resp 18 | Wt 154.0 lb

## 2012-09-04 DIAGNOSIS — G20 Parkinson's disease: Secondary | ICD-10-CM

## 2012-09-04 DIAGNOSIS — N529 Male erectile dysfunction, unspecified: Secondary | ICD-10-CM | POA: Diagnosis not present

## 2012-09-04 DIAGNOSIS — E538 Deficiency of other specified B group vitamins: Secondary | ICD-10-CM

## 2012-09-04 LAB — VITAMIN B12: Vitamin B-12: 318 pg/mL (ref 211–911)

## 2012-09-04 MED ORDER — CARBIDOPA-LEVODOPA 25-100 MG PO TABS: 2.0000 | ORAL_TABLET | Freq: Four times a day (QID) | ORAL | Status: DC

## 2012-09-04 NOTE — Progress Notes (Signed)
Ian [name] was seen today in the movement disorders clinic for f/u.  He is accompanied by his wife who supplements the history.   The first symptom(s) the patient noticed was tremor in the R hand when he would use the hand or have a cup of coffee in it.  His wife believes that this began in 2004.  He was referred to Dr. Lance [name].  He tried some medication and according to his wife, he kept having adverse SE.  He was on mirapex and sinemet but reported dizziness and bad dreams with both.   He was referred to Dr. Harrel [name] in about 2010 because he was interested in DBS but the patient was concerned about risks of the surgery and potential side effects.    The patient is currently on Sinemet CR 25/100, 2 po qid.  Artane was discontinued because of stomach upset and the potential for cognitive difficulties, and he has noticed increased tremor since then.  He is not sure that he noticed any difference with adding the carbidopa/levodopa 50/200 at night.  I reviewed records from Ian [name] last visit and those noted information with the exception of the fact that his last UPDRS was 30. Wearing off:  yes  How long before next dose:  Inconsistent in timing Falls:   no N/V:  no Hallucinations:  no  visual distortions: no Lightheaded:  no  Syncope: no Dyskinesia:  no    Neuroimaging has previously been performed.  It is not available for my review today.  PREVIOUS MEDICATIONS: Sinemet (reported bad dreams/dizziness on regular formulation and same with Mirapex); requip;  on Sinemet CR currently.  ALLERGIES:   Allergies  Allergen Reactions  . Amlodipine Besylate     REACTION: leg pain  . Penicillins     REACTION: rash  . Pramipexole Dihydrochloride     REACTION: drownsiness,bad dreams  . Simvastatin     REACTION: dizziness    CURRENT MEDICATIONS:  Current Outpatient Prescriptions on File Prior to Visit  Medication Sig Dispense Refill  . aspirin 325 MG tablet Take 325 mg by mouth daily.      .  bisoprolol-hydrochlorothiazide (ZIAC) 5-6.25 MG per tablet Take 1 tablet by mouth daily.  90 tablet  3  . carbidopa-levodopa (SINEMET CR) 50-200 MG per tablet Take 1 tablet by mouth at bedtime.  90 tablet  3  . hydrochlorothiazide (MICROZIDE) 12.5 MG capsule Take 12.5 mg by mouth daily.      . pantoprazole (PROTONIX) 40 MG tablet Take 40 mg by mouth daily as needed.      . polyethylene glycol (MIRALAX / GLYCOLAX) packet Take 17 g by mouth daily.       No current facility-administered medications on file prior to visit.    PAST MEDICAL HISTORY:   Past Medical History  Diagnosis Date  . CAD (coronary artery disease) CABG x1 2001; PCI 2008    s/p LIMA-LAD in2001 with AVR; BMS to LCx 8/08  . Allergy   . Diverticulitis   . GERD (gastroesophageal reflux disease)   . Hypertension   . Osteoarthritis   . Osteoporosis   . MALT lymphoma   . Parkinson's disease   . ASCVD (arteriosclerotic cardiovascular disease) 06/08    TIA  . Hyperlipidemia   . ED (erectile dysfunction)   . Aortic valve stenosis, acquired 2001    s/p Porcine AVR; by 05/2009 Echo - EF 45-50%, moderate AS -- AVA 0.98 cm2, peak gradient  . Right renal artery stenosis 2012  90% stenosis - stable  . S/P cardiac pacemaker procedure, Medtronic Adapta L  ADDRL1, 01/21/12 01/22/2012    PAST SURGICAL HISTORY:   Past Surgical History  Procedure Laterality Date  . Coronary artery bypass graft  2001    Zenaida Niece trigt ) aortic valve replacement 2001; LIMA-LAD.  Marland Kitchen Esophagogastroduodenoscopy      multiple, Colon/ EGD benign 11/2004  . Pilonidal cyst / sinus excision  1953  . Inguinal hernia repair      LIH 1997Llap. bilateral hernias 1998  . Coronary stent placement  11/2007    8/09  Left Circumflex Stent by Dr Rosie Fate started and aggrenox stopped  . Cataract extraction  06/09    left  . Laparoscopic cholecystectomy  07/11    Dr.Rosenbower  . Squamous cell carcinoma excision  3/13    back  . Aortic valve replacement   2001    Porcine valve  . Pacemaker insertion  01/2012    bradycardia    SOCIAL HISTORY:   History   Social History  . Marital Status: Married    Spouse Name: N/A    Number of Children: 3  . Years of Education: N/A   Occupational History  . retired- [employer]   . part-time sub/courier for schools- now only rarely    Social History Main Topics  . Smoking status: Former Smoker    Types: Cigarettes    Quit date: 06/05/1961  . Smokeless tobacco: Never Used  . Alcohol Use: Yes     Comment: very rare  . Drug Use: No  . Sexually Active: No   Other Topics Concern  . Not on file   Social History Narrative  . No narrative on file    FAMILY HISTORY:   Family Status  Relation Status Death Age  . Mother Deceased 4    old age  . Father Deceased 56    acute bronchitis, cotton exposure  . Sister Deceased     trauma  . Brother Deceased     2, deceased  . Daughter Deceased 16    osteosarcoma  . Child Alive     3, healthy  . Brother Alive     healthy  . Sister Alive     healthy    ROS:  Has problems with chronic constipation.  Has been on metamucil and dulcolax without significant relief.  He has tried miralax but only 2 times.  Been on fibercon.  A complete 10 system review of systems was obtained and was unremarkable apart from what is mentioned above.  PHYSICAL EXAMINATION:    VITALS:   Filed Vitals:   09/04/12 1019  BP: 122/76  Pulse: 80  Temp: 97.6 F (36.4 C)  Resp: 18  Weight: 154 lb (69.854 kg)    GEN:  The patient appears stated age and is in NAD. HEENT:  Normocephalic, atraumatic.  The mucous membranes are moist. The superficial temporal arteries are without ropiness or tenderness. CV:  RRR Lungs:  CTAB Neck/HEME:  There are no carotid bruits bilaterally.  Neurological examination:  Orientation: A MoCA was performed today and the patient scored a 20/30. Cranial nerves: There is good facial symmetry. Pupils are equal round and reactive to  light bilaterally. There are no square wave jerks.  Fundoscopic exam is attempted but the disc margins are not well visualized bilaterally.Extraocular muscles are intact. The visual fields are full to confrontational testing. The speech is fluent and clear. Soft palate rises symmetrically and there is  no tongue deviation. Hearing is intact to conversational tone. Sensation: Sensation is intact to light and pinprick throughout (facial, trunk, extremities). Vibration is decreased at the bilateral big toe and in a distal fashion. There is no extinction with double simultaneous stimulation. There is no sensory dermatomal level identified. Motor: Strength is 5/5 in the bilateral upper and lower extremities.   Shoulder shrug is equal and symmetric.  There is no pronator drift. Deep tendon reflexes: Deep tendon reflexes are 2/4 at the bilateral biceps, triceps, brachioradialis, patella and absent at the bilateral achilles. Plantar responses are downgoing bilaterally.  Movement examination: Tone: There is mild increased tone in the bilateral upper extremities that increases with activation procedures.  The tone in the lower extremities is normal.  Abnormal movements: There is a moderate RUE resting tremor and mild-mod LUE resting tremor.  No dyskinesia. Coordination:  There is miminal decremation with RAM's, seen mostly with hand opening and closing on the R Gait and Station: The patient has no difficulty arising out of a deep-seated chair without the use of the hands. The patient's stride length is normal.  The patient has a negative pull test.      LABS:  Lab Results  Component Value Date   WBC 9.0 03/29/2012   HGB 13.6 03/29/2012   HCT 40.3 03/29/2012   MCV 91.0 03/29/2012   PLT 296.0 03/29/2012     Chemistry      Component Value Date/Time   NA 140 03/29/2012 1138   K 4.2 03/29/2012 1138   CL 101 03/29/2012 1138   CO2 32 03/29/2012 1138   BUN 15 03/29/2012 1138   CREATININE 0.8 03/29/2012  1138      Component Value Date/Time   CALCIUM 8.8 03/29/2012 1138   ALKPHOS 60 03/29/2012 1138   AST 12 03/29/2012 1138   ALT 3 03/29/2012 1138   BILITOT 0.6 03/29/2012 1138     Lab Results  Component Value Date   VITAMINB12 273 04/30/2008   Lab Results  Component Value Date   TSH 2.10 03/29/2012     ASSESSMENT/PLAN:  1.  Idiopathic Parkinsons disease.    -We discussed the diagnosis as well as pathophysiology of the disease.  We discussed treatment options as well as prognostic indicators.  Patient education was provided.  -We decided to retry the carbidopa/levodopa 25/100 immediate release and see if he does better on this, as he is noticing inconsistent release with the CR, which is common with this preparation.  He will take carbidopa/levodopa 25/104 times a day and continue the carbidopa/levodopa 50/200 CR only at bedtime.    -I talked with him about entacapone in the near future.  We will discuss this next visit.    -I talked with him about DBS.  He was reluctant to try this.  He was talked to her son he lives at [facility], but they do unilateral implants and he was worried about this.  We will do bilateral if he decided that he would like to look into this further.  He did not want a patient DVD today. 2.  Mild cognitive impairment, likely from longstanding PD.  -He may benefit from an acetylcholinesterase in the future such as Exelon patch.  We will wait on starting this because I am starting other medication today.  He actually seems better in this regard today.   3.  Constipation.  -This is very common in patients with Parkinson's disease.  I encouraged continual exercise.  I gave him a copy of  the [place] recipe for constipation which does seem to be helping.  4.  B12 deficiency.  -I am going to recheck his B12 level today. 5.  Erectile dysfunction.  -He asked me about Cialis.  I really have no objection to it as long as he does not have significant blood pressure changes.  I  told him to ask his primary care physician.  He was on this in the past. 4.  Return in about 3 weeks (around 09/25/2012). 5.  Time in the room with the patient was 40 min.

## 2012-09-04 NOTE — Patient Instructions (Addendum)
1.  Lets try and change the DAYTIME dosages to carbidopa/levodopa 25/100 - 2 tablets four times per day.  Continue with the carbidopa/levodopa CR 50/200 at night 2.  I will see you in 3 1/2 weeks

## 2012-09-05 ENCOUNTER — Telehealth: Payer: Self-pay | Admitting: Neurology

## 2012-09-05 NOTE — Telephone Encounter (Signed)
Picked up a call from the patient. Information given as per Dr. Arbutus Leas below re: low B12. The patient will take on OTC supplement. No additional questions or concerns voiced at this time.

## 2012-09-05 NOTE — Telephone Encounter (Signed)
Message copied by Benay Spice on Tue Sep 05, 2012 10:03 AM ------      Message from: TAT, REBECCA S      Created: Tue Sep 05, 2012  9:46 AM       Let pt know that B12 was still a little low.  Have him start B12 oral - daily and we will recheck in the future. ------

## 2012-09-05 NOTE — Telephone Encounter (Signed)
Left a message for the patient to return my call.  

## 2012-09-12 DIAGNOSIS — C44519 Basal cell carcinoma of skin of other part of trunk: Secondary | ICD-10-CM | POA: Diagnosis not present

## 2012-09-12 DIAGNOSIS — L570 Actinic keratosis: Secondary | ICD-10-CM | POA: Diagnosis not present

## 2012-09-12 DIAGNOSIS — L219 Seborrheic dermatitis, unspecified: Secondary | ICD-10-CM | POA: Diagnosis not present

## 2012-09-12 DIAGNOSIS — D235 Other benign neoplasm of skin of trunk: Secondary | ICD-10-CM | POA: Diagnosis not present

## 2012-09-27 ENCOUNTER — Encounter: Payer: Self-pay | Admitting: Internal Medicine

## 2012-09-27 ENCOUNTER — Ambulatory Visit (INDEPENDENT_AMBULATORY_CARE_PROVIDER_SITE_OTHER): Payer: Medicare Other | Admitting: Internal Medicine

## 2012-09-27 VITALS — BP 120/70 | HR 78 | Temp 98.5°F | Wt 153.0 lb

## 2012-09-27 DIAGNOSIS — K5904 Chronic idiopathic constipation: Secondary | ICD-10-CM

## 2012-09-27 DIAGNOSIS — I10 Essential (primary) hypertension: Secondary | ICD-10-CM | POA: Diagnosis not present

## 2012-09-27 DIAGNOSIS — F0280 Dementia in other diseases classified elsewhere without behavioral disturbance: Secondary | ICD-10-CM

## 2012-09-27 DIAGNOSIS — K219 Gastro-esophageal reflux disease without esophagitis: Secondary | ICD-10-CM | POA: Diagnosis not present

## 2012-09-27 DIAGNOSIS — K5909 Other constipation: Secondary | ICD-10-CM | POA: Diagnosis not present

## 2012-09-27 DIAGNOSIS — G20 Parkinson's disease: Secondary | ICD-10-CM | POA: Diagnosis not present

## 2012-09-27 NOTE — Assessment & Plan Note (Signed)
Okay with just prn PPI

## 2012-09-27 NOTE — Assessment & Plan Note (Signed)
BP Readings from Last 3 Encounters:  09/27/12 120/70  09/04/12 122/76  06/05/12 112/76   Fine  Increased beta blocker Labs next time

## 2012-09-27 NOTE — Assessment & Plan Note (Signed)
Still his major limiting factor Continues to work with Dr Arbutus Leas

## 2012-09-27 NOTE — Assessment & Plan Note (Signed)
Still working on his regimen

## 2012-09-27 NOTE — Progress Notes (Signed)
Subjective:    Patient ID: Ian [name], male    DOB: March 04, 1929, 79 y.o.   MRN: [id number]  HPI Here with wife Satisfied with interaction with Dr Tat but doesn't notice any improvement from meds No recent falls  He notices some decline in memory No change in function--still helps with instrumental ADLs Has stopped driving  Still sees Dr Allyson Sabal but was switched to Dr Royann Shivers Pacemaker was actually put in by Dr Johney Frame Bisoprolol dose was increased to 10/6.25 Not sure if he is still on the separate HCTZ  Uses the pantoprazole prn only Prevacid in the past also It works when needed  On new bowel regimen--bran, prune juice, applesauce Probably needs more still Uses the miralax also  Current Outpatient Prescriptions on File Prior to Visit  Medication Sig Dispense Refill  . carbidopa-levodopa (SINEMET CR) 50-200 MG per tablet Take 1 tablet by mouth at bedtime.  90 tablet  3  . carbidopa-levodopa (SINEMET) 25-100 MG per tablet Take 2 tablets by mouth 4 (four) times daily.  240 tablet  0  . hydrochlorothiazide (MICROZIDE) 12.5 MG capsule Take 12.5 mg by mouth daily.      . pantoprazole (PROTONIX) 40 MG tablet Take 40 mg by mouth daily as needed.      . polyethylene glycol (MIRALAX / GLYCOLAX) packet Take 17 g by mouth daily.       No current facility-administered medications on file prior to visit.    Allergies  Allergen Reactions  . Amlodipine Besylate     REACTION: leg pain  . Penicillins     REACTION: rash  . Pramipexole Dihydrochloride     REACTION: drownsiness,bad dreams  . Simvastatin     REACTION: dizziness    Past Medical History  Diagnosis Date  . CAD (coronary artery disease) CABG x1 2001; PCI 2008    s/p LIMA-LAD in2001 with AVR; BMS to LCx 8/08  . Allergy   . Diverticulitis   . GERD (gastroesophageal reflux disease)   . Hypertension   . Osteoarthritis   . Osteoporosis   . MALT lymphoma   . Parkinson's disease   . ASCVD (arteriosclerotic  cardiovascular disease) 06/08    TIA  . Hyperlipidemia   . ED (erectile dysfunction)   . Aortic valve stenosis, acquired 2001    s/p Porcine AVR; by 05/2009 Echo - EF 45-50%, moderate AS -- AVA 0.98 cm2, peak gradient  . Right renal artery stenosis 2012    90% stenosis - stable  . S/P cardiac pacemaker procedure, Medtronic Adapta L  ADDRL1, 01/21/12 01/22/2012    Past Surgical History  Procedure Laterality Date  . Coronary artery bypass graft  2001    Zenaida Niece trigt ) aortic valve replacement 2001; LIMA-LAD.  Marland Kitchen Esophagogastroduodenoscopy      multiple, Colon/ EGD benign 11/2004  . Pilonidal cyst / sinus excision  1953  . Inguinal hernia repair      LIH 1997Llap. bilateral hernias 1998  . Coronary stent placement  11/2007    8/09  Left Circumflex Stent by Dr Rosie Fate started and aggrenox stopped  . Cataract extraction  06/09    left  . Laparoscopic cholecystectomy  07/11    Dr.Rosenbower  . Squamous cell carcinoma excision  3/13    back  . Aortic valve replacement  2001    Porcine valve  . Pacemaker insertion  01/2012    bradycardia    Family History  Problem Relation Age of Onset  . Heart disease Brother  History   Social History  . Marital Status: Married    Spouse Name: N/A    Number of Children: 3  . Years of Education: N/A   Occupational History  . retired- [employer]   . part-time sub/courier for schools- now only rarely    Social History Main Topics  . Smoking status: Former Smoker    Types: Cigarettes    Quit date: 06/05/1961  . Smokeless tobacco: Never Used  . Alcohol Use: Yes     Comment: very rare  . Drug Use: No  . Sexually Active: No   Other Topics Concern  . Not on file   Social History Narrative  . No narrative on file   Review of Systems Has sleep problems Trying the long acting sinemet for bedtime---doesn't seem to hold him Appetite is good for him---still small quantities at a time. Some swallowing problems with dry  mouth    Objective:   Physical Exam  Constitutional: He appears well-developed and well-nourished. No distress.  Neck: Normal range of motion. Neck supple.  Cardiovascular: Normal rate and regular rhythm.  Exam reveals no gallop.   Murmur heard. Soft systolic murmur at base  Pulmonary/Chest: Effort normal and breath sounds normal. No respiratory distress. He has no wheezes. He has no rales.  Abdominal: Soft. There is no tenderness.  Musculoskeletal: He exhibits no edema.  Lymphadenopathy:    He has no cervical adenopathy.  Neurological:  Resting tremor-- R>L Mild bradykinesia and increased tone  Psychiatric: He has a normal mood and affect. His behavior is normal.          Assessment & Plan:

## 2012-09-27 NOTE — Assessment & Plan Note (Signed)
Memory is stable No functional decline

## 2012-09-28 ENCOUNTER — Encounter: Payer: Self-pay | Admitting: Neurology

## 2012-09-28 ENCOUNTER — Ambulatory Visit (INDEPENDENT_AMBULATORY_CARE_PROVIDER_SITE_OTHER): Payer: Medicare Other | Admitting: Neurology

## 2012-09-28 VITALS — BP 112/72 | HR 80 | Temp 97.9°F | Resp 16 | Wt 153.0 lb

## 2012-09-28 DIAGNOSIS — G20 Parkinson's disease: Secondary | ICD-10-CM

## 2012-09-28 DIAGNOSIS — E538 Deficiency of other specified B group vitamins: Secondary | ICD-10-CM | POA: Diagnosis not present

## 2012-09-28 DIAGNOSIS — G20A1 Parkinson's disease without dyskinesia, without mention of fluctuations: Secondary | ICD-10-CM

## 2012-09-28 MED ORDER — ENTACAPONE 200 MG PO TABS: 200.0000 mg | ORAL_TABLET | Freq: Four times a day (QID) | ORAL | Status: DC

## 2012-09-28 NOTE — Progress Notes (Signed)
Ian [name] was seen today in the movement disorders clinic for f/u.  He is accompanied by his wife who supplements the history.  The first symptom(s) the patient noticed was tremor in the R hand when he would use the hand or have a cup of coffee in it.  His wife believes that this began in 2004.  He was referred to Dr. Lance [name].  He tried some medication and according to his wife, he kept having adverse SE.  He was on mirapex and sinemet but reported dizziness and bad dreams with both.   He was referred to Dr. Harrel [name] in about 2010 because he was interested in DBS but the patient was concerned about risks of the surgery and potential side effects.    09/28/12  I tried to change the levodopa back to the IR formulation but he did not like it because he was too drowsy and went back to the CR formulation.  , 2 po qid and carbidopa/levodopa 50/200 at night.    He has continued to have more tremor after the artane was d/c.  However, this has become less bothersome for him over time.  He does notice that sometimes the medication wears off quickly and sometimes it does not.  It seems inconsistent.   Wearing off:  yes  How long before next dose:  Inconsistent in timing Falls:   no N/V:  no Hallucinations:  no  visual distortions: no Lightheaded:  no  Syncope: no Dyskinesia:  no  He did have a mild B12 def with a level of 318 and did start an oral supplement since last visit.  Neuroimaging has previously been performed.  It is not available for my review today.  PREVIOUS MEDICATIONS: Sinemet (reported bad dreams/dizziness on regular formulation and same with Mirapex); requip;  on Sinemet CR currently.  ALLERGIES:   Allergies  Allergen Reactions  . Amlodipine Besylate     REACTION: leg pain  . Penicillins     REACTION: rash  . Pramipexole Dihydrochloride     REACTION: drownsiness,bad dreams  . Simvastatin     REACTION: dizziness    CURRENT MEDICATIONS:  Current Outpatient Prescriptions on  File Prior to Visit  Medication Sig Dispense Refill  . aspirin 81 MG tablet Take 81 mg by mouth daily.      . bisoprolol-hydrochlorothiazide (ZIAC) 10-6.25 MG per tablet Take 1 tablet by mouth daily.      . hydrochlorothiazide (MICROZIDE) 12.5 MG capsule Take 12.5 mg by mouth daily.      . pantoprazole (PROTONIX) 40 MG tablet Take 40 mg by mouth daily as needed.      . polyethylene glycol (MIRALAX / GLYCOLAX) packet Take 17 g by mouth daily.      . vitamin B-12 (CYANOCOBALAMIN) 1000 MCG tablet Take 1,000 mcg by mouth daily.      . carbidopa-levodopa (SINEMET) 25-100 MG per tablet Take 2 tablets by mouth 4 (four) times daily.  240 tablet  0   No current facility-administered medications on file prior to visit.    PAST MEDICAL HISTORY:   Past Medical History  Diagnosis Date  . CAD (coronary artery disease) CABG x1 2001; PCI 2008    s/p LIMA-LAD in2001 with AVR; BMS to LCx 8/08  . Allergy   . Diverticulitis   . GERD (gastroesophageal reflux disease)   . Hypertension   . Osteoarthritis   . Osteoporosis   . MALT lymphoma   . Parkinson's disease   . ASCVD (arteriosclerotic cardiovascular  disease) 06/08    TIA  . Hyperlipidemia   . ED (erectile dysfunction)   . Aortic valve stenosis, acquired 2001    s/p Porcine AVR; by 05/2009 Echo - EF 45-50%, moderate AS -- AVA 0.98 cm2, peak gradient  . Right renal artery stenosis 2012    90% stenosis - stable  . S/P cardiac pacemaker procedure, Medtronic Adapta L  ADDRL1, 01/21/12 01/22/2012    PAST SURGICAL HISTORY:   Past Surgical History  Procedure Laterality Date  . Coronary artery bypass graft  2001    Ian [name] ) aortic valve replacement 2001; LIMA-LAD.  Marland Kitchen Esophagogastroduodenoscopy      multiple, Colon/ EGD benign 11/2004  . Pilonidal cyst / sinus excision  1953  . Inguinal hernia repair      LIH 1997Llap. bilateral hernias 1998  . Coronary stent placement  11/2007    8/09  Left Circumflex Stent by Dr Ian [name] started  and aggrenox stopped  . Cataract extraction  06/09    left  . Laparoscopic cholecystectomy  07/11    Dr.Rosenbower  . Squamous cell carcinoma excision  3/13    back  . Aortic valve replacement  2001    Porcine valve  . Pacemaker insertion  01/2012    bradycardia    SOCIAL HISTORY:   History   Social History  . Marital Status: Married    Spouse Name: N/A    Number of Children: 3  . Years of Education: N/A   Occupational History  . retired- [employer]   . part-time sub/courier for schools- now only rarely    Social History Main Topics  . Smoking status: Former Smoker    Types: Cigarettes    Quit date: 06/05/1961  . Smokeless tobacco: Never Used  . Alcohol Use: Yes     Comment: very rare  . Drug Use: No  . Sexually Active: No   Other Topics Concern  . Not on file   Social History Narrative   Has living will   Wife, then son Ian [name], hold health care POA   Requests DNR after discussion   Requests no tube feeds if cognitively unaware    FAMILY HISTORY:   Family Status  Relation Status Death Age  . Mother Deceased 15    old age  . Father Deceased 63    acute bronchitis, cotton exposure  . Sister Deceased     trauma  . Brother Deceased     2, deceased  . Daughter Deceased 16    osteosarcoma  . Child Alive     3, healthy  . Brother Alive     healthy  . Sister Alive     healthy    ROS:  Has problems with chronic constipation.  Has been on metamucil and dulcolax without significant relief.  He has tried miralax but only 2 times.  Been on fibercon.  A complete 10 system review of systems was obtained and was unremarkable apart from what is mentioned above.  PHYSICAL EXAMINATION:    VITALS:   Filed Vitals:   09/28/12 1110  BP: 112/72  Pulse: 80  Temp: 97.9 F (36.6 C)  Resp: 16  Weight: 153 lb (69.4 kg)    GEN:  The patient appears stated age and is in NAD. HEENT:  Normocephalic, atraumatic.  The mucous membranes are moist. The  superficial temporal arteries are without ropiness or tenderness. CV:  RRR Lungs:  CTAB Neck/HEME:  There are no carotid  bruits bilaterally.  Neurological examination:  Orientation: A MoCA was performed today and the patient scored a 20/30. Cranial nerves: There is good facial symmetry. Pupils are equal round and reactive to light bilaterally. There are no square wave jerks.  Fundoscopic exam is attempted but the disc margins are not well visualized bilaterally.Extraocular muscles are intact. The visual fields are full to confrontational testing. The speech is fluent and clear. Soft palate rises symmetrically and there is no tongue deviation. Hearing is intact to conversational tone. Sensation: Sensation is intact to light and pinprick throughout (facial, trunk, extremities). Vibration is decreased at the bilateral big toe and in a distal fashion. There is no extinction with double simultaneous stimulation. There is no sensory dermatomal level identified. Motor: Strength is 5/5 in the bilateral upper and lower extremities.   Shoulder shrug is equal and symmetric.  There is no pronator drift. Deep tendon reflexes: Deep tendon reflexes are 2/4 at the bilateral biceps, triceps, brachioradialis, patella and absent at the bilateral achilles. Plantar responses are downgoing bilaterally.  Movement examination: Tone: There is mild increased tone in the bilateral upper extremities that increases with activation procedures.  The tone in the lower extremities is normal.  Abnormal movements: There is a moderate RUE resting tremor and mild-mod LUE resting tremor.  No dyskinesia. Coordination:  There is miminal decremation with RAM's, seen mostly with hand opening and closing on the R Gait and Station: The patient has no difficulty arising out of a deep-seated chair without the use of the hands. The patient's stride length is normal.  The patient has a negative pull test.      LABS:  Lab Results  Component  Value Date   WBC 9.0 03/29/2012   HGB 13.6 03/29/2012   HCT 40.3 03/29/2012   MCV 91.0 03/29/2012   PLT 296.0 03/29/2012     Chemistry      Component Value Date/Time   NA 140 03/29/2012 1138   K 4.2 03/29/2012 1138   CL 101 03/29/2012 1138   CO2 32 03/29/2012 1138   BUN 15 03/29/2012 1138   CREATININE 0.8 03/29/2012 1138      Component Value Date/Time   CALCIUM 8.8 03/29/2012 1138   ALKPHOS 60 03/29/2012 1138   AST 12 03/29/2012 1138   ALT 3 03/29/2012 1138   BILITOT 0.6 03/29/2012 1138     Lab Results  Component Value Date   VITAMINB12 318 09/04/2012   Lab Results  Component Value Date   TSH 2.10 03/29/2012     ASSESSMENT/PLAN:  1.  Idiopathic Parkinsons disease.    -We discussed the diagnosis as well as pathophysiology of the disease.  We discussed treatment options as well as prognostic indicators.  Patient education was provided.  -He was unable to take the IR form of levodopa and has gone back to the 25/100 CR, 2 po qid with the 50/200 CR at night.  Not surprisingly, he has noted inconsistent release with the CR.  At the end of the year, a new formulation should be coming out in this regard.  -I am going to add entacapone 200 mg 4 times a day with each dosing of levodopa, with the exception of the bedtime dose.  Risks, benefits, side effects and alternative therapies were discussed.  The opportunity to ask questions was given and they were answered to the best of my ability.  The patient expressed understanding and willingness to follow the outlined treatment protocols.  -I talked with him about DBS.  He was reluctant to try this.  He was talked to her son he lives at [facility], but they do unilateral implants and he was worried about this.  We will do bilateral if he decided that he would like to look into this further.  He did not want a patient DVD today. 2.  Mild cognitive impairment, likely from longstanding PD.  -He is really doing much better in this regard since  being off of the artane.  3.  Constipation.  -This is very common in patients with Parkinson's disease. He has the rancho recipe. 4.  B12 deficiency.  -I am going to recheck his B12 level next visit now that he is on oral supplements. 5.   Return in about 5 months (around 02/21/2013). he can certainly call me if he needs me sooner, but he and his wife are going to the beach for an extended amount of time and November was the best time to followup for them.

## 2012-10-02 ENCOUNTER — Other Ambulatory Visit: Payer: Self-pay

## 2012-10-02 MED ORDER — ENTACAPONE 200 MG PO TABS: 200.0000 mg | ORAL_TABLET | Freq: Four times a day (QID) | ORAL | Status: DC

## 2012-10-02 NOTE — Telephone Encounter (Signed)
Per [employer] unavailable from manufacturer.  Will try [facility] instead, pt aware.

## 2012-10-04 ENCOUNTER — Other Ambulatory Visit: Payer: Self-pay | Admitting: Cardiovascular Disease

## 2012-10-04 DIAGNOSIS — I498 Other specified cardiac arrhythmias: Secondary | ICD-10-CM

## 2012-10-04 LAB — PACEMAKER DEVICE OBSERVATION

## 2012-10-09 ENCOUNTER — Encounter: Payer: Self-pay | Admitting: *Deleted

## 2012-10-09 LAB — REMOTE PACEMAKER DEVICE
AL AMPLITUDE: 1.4 mv
AL THRESHOLD: 0.75 V
BAMS-0001: 150 {beats}/min
VENTRICULAR PACING PM: 99

## 2012-10-10 ENCOUNTER — Telehealth: Payer: Self-pay

## 2012-10-10 NOTE — Telephone Encounter (Signed)
Pt.notified

## 2012-10-10 NOTE — Telephone Encounter (Signed)
Pt started on the entacapone last week (Friday) and is having a lot of nausea, sleeplessness which is bothering more than the nausea, wants to know if he can take half instead.  Currently taking 200mg  qid.

## 2012-10-10 NOTE — Telephone Encounter (Signed)
Have him just take it twice per day (with 2 of the levodopa dosages) instead of 4 times.

## 2012-10-16 ENCOUNTER — Telehealth: Payer: Self-pay

## 2012-10-16 NOTE — Telephone Encounter (Signed)
Pt calling to report that even on half the dose of comtan he is still feeling really bad.  Tired, dizzy, nausea, muscles feel tired.  He would like to stop it altogether.

## 2012-10-17 NOTE — Telephone Encounter (Signed)
Pt.notified

## 2012-10-17 NOTE — Telephone Encounter (Signed)
okay

## 2012-11-03 ENCOUNTER — Encounter: Payer: Self-pay | Admitting: Cardiovascular Disease

## 2012-11-29 ENCOUNTER — Telehealth: Payer: Self-pay | Admitting: Neurology

## 2012-11-29 NOTE — Telephone Encounter (Signed)
Pt wanted to clarify how to take the comtan, he had forgotten and had not been taking.  Went over directions per last ov.  Pt said he would start taking accordingly.

## 2012-12-04 ENCOUNTER — Telehealth: Payer: Self-pay

## 2012-12-04 NOTE — Telephone Encounter (Signed)
Pt calling for refill of his carbi/levo to [facility].

## 2012-12-06 NOTE — Telephone Encounter (Signed)
No problem.  90 day supply with a refill

## 2012-12-07 MED ORDER — CARBIDOPA-LEVODOPA 25-100 MG PO TABS: 2.0000 | ORAL_TABLET | Freq: Four times a day (QID) | ORAL | Status: DC

## 2012-12-07 NOTE — Telephone Encounter (Signed)
Rx sent in

## 2013-01-04 DIAGNOSIS — Z23 Encounter for immunization: Secondary | ICD-10-CM | POA: Diagnosis not present

## 2013-01-07 ENCOUNTER — Telehealth: Payer: Self-pay | Admitting: Cardiology

## 2013-01-07 ENCOUNTER — Emergency Department (HOSPITAL_COMMUNITY)
Admission: EM | Admit: 2013-01-07 | Discharge: 2013-01-07 | Disposition: A | Discharge status: Home / Self Care | Payer: Medicare Other | Attending: Emergency Medicine | Admitting: Emergency Medicine

## 2013-01-07 ENCOUNTER — Encounter (HOSPITAL_COMMUNITY): Payer: Self-pay | Admitting: Emergency Medicine

## 2013-01-07 ENCOUNTER — Emergency Department (HOSPITAL_COMMUNITY): Payer: Medicare Other

## 2013-01-07 ENCOUNTER — Telehealth (HOSPITAL_COMMUNITY): Payer: Self-pay | Admitting: Emergency Medicine

## 2013-01-07 ENCOUNTER — Other Ambulatory Visit: Payer: Self-pay | Admitting: Cardiovascular Disease

## 2013-01-07 DIAGNOSIS — Z87898 Personal history of other specified conditions: Secondary | ICD-10-CM | POA: Insufficient documentation

## 2013-01-07 DIAGNOSIS — Z87448 Personal history of other diseases of urinary system: Secondary | ICD-10-CM | POA: Insufficient documentation

## 2013-01-07 DIAGNOSIS — Z951 Presence of aortocoronary bypass graft: Secondary | ICD-10-CM | POA: Diagnosis not present

## 2013-01-07 DIAGNOSIS — Z950 Presence of cardiac pacemaker: Secondary | ICD-10-CM | POA: Insufficient documentation

## 2013-01-07 DIAGNOSIS — Z87891 Personal history of nicotine dependence: Secondary | ICD-10-CM | POA: Insufficient documentation

## 2013-01-07 DIAGNOSIS — R002 Palpitations: Secondary | ICD-10-CM

## 2013-01-07 DIAGNOSIS — R011 Cardiac murmur, unspecified: Secondary | ICD-10-CM | POA: Diagnosis not present

## 2013-01-07 DIAGNOSIS — Z8679 Personal history of other diseases of the circulatory system: Secondary | ICD-10-CM | POA: Diagnosis not present

## 2013-01-07 DIAGNOSIS — E785 Hyperlipidemia, unspecified: Secondary | ICD-10-CM | POA: Diagnosis not present

## 2013-01-07 DIAGNOSIS — G20 Parkinson's disease: Secondary | ICD-10-CM | POA: Diagnosis not present

## 2013-01-07 DIAGNOSIS — Z8719 Personal history of other diseases of the digestive system: Secondary | ICD-10-CM | POA: Insufficient documentation

## 2013-01-07 DIAGNOSIS — I2510 Atherosclerotic heart disease of native coronary artery without angina pectoris: Secondary | ICD-10-CM | POA: Diagnosis not present

## 2013-01-07 DIAGNOSIS — Z7982 Long term (current) use of aspirin: Secondary | ICD-10-CM | POA: Diagnosis not present

## 2013-01-07 DIAGNOSIS — G20A1 Parkinson's disease without dyskinesia, without mention of fluctuations: Secondary | ICD-10-CM | POA: Insufficient documentation

## 2013-01-07 DIAGNOSIS — R000 Tachycardia, unspecified: Secondary | ICD-10-CM | POA: Diagnosis not present

## 2013-01-07 DIAGNOSIS — Z8739 Personal history of other diseases of the musculoskeletal system and connective tissue: Secondary | ICD-10-CM | POA: Diagnosis not present

## 2013-01-07 DIAGNOSIS — Z79899 Other long term (current) drug therapy: Secondary | ICD-10-CM | POA: Diagnosis not present

## 2013-01-07 DIAGNOSIS — I10 Essential (primary) hypertension: Secondary | ICD-10-CM | POA: Insufficient documentation

## 2013-01-07 LAB — CBC WITH DIFFERENTIAL/PLATELET
Basophils Absolute: 0 10*3/uL (ref 0.0–0.1)
Basophils Relative: 1 % (ref 0–1)
Eosinophils Absolute: 0.4 10*3/uL (ref 0.0–0.7)
MCH: 31.3 pg (ref 26.0–34.0)
MCHC: 34 g/dL (ref 30.0–36.0)
Monocytes Relative: 7 % (ref 3–12)
Neutrophils Relative %: 69 % (ref 43–77)
Platelets: 250 10*3/uL (ref 150–400)
RDW: 13.2 % (ref 11.5–15.5)

## 2013-01-07 LAB — PACEMAKER DEVICE OBSERVATION

## 2013-01-07 LAB — BASIC METABOLIC PANEL
BUN: 14 mg/dL (ref 6–23)
Calcium: 9.2 mg/dL (ref 8.4–10.5)
Creatinine, Ser: 0.73 mg/dL (ref 0.50–1.35)
GFR calc non Af Amer: 85 mL/min — ABNORMAL LOW (ref >90)
Glucose, Bld: 109 mg/dL — ABNORMAL HIGH (ref 70–99)

## 2013-01-07 LAB — POCT I-STAT TROPONIN I: Troponin i, poc: 0 ng/mL (ref 0.00–0.08)

## 2013-01-07 NOTE — ED Notes (Signed)
Pt calling to verify appt time w/ [facility].  Verified showing appt w/ [facility] for 640 in am.

## 2013-01-07 NOTE — ED Provider Notes (Signed)
CSN: [id number]     Arrival date & time 01/07/13  8657 History   First MD Initiated Contact with Patient 01/07/13 0701     Chief Complaint  Patient presents with  . Tachycardia   (Consider location/radiation/quality/duration/timing/severity/associated sxs/prior Treatment) HPI Comments: Pt reports waking at 2am and "just didn't feel right", so he took his pulse and it was high.  At 5am took BP w/ a cuff, HR 120.  No associated CP, SOB, n/v, diaphoresis.  Symptoms since resolved.  No hx of similar.  No med changes, no recent illness.    Patient is a 79 y.o. male presenting with palpitations. The history is provided by the patient. No language interpreter was used.  Palpitations Palpitations quality:  Regular Onset quality:  Sudden Duration:  4 hours Timing:  Constant Progression:  Resolved Chronicity:  New Context comment:  Woke from sleep 2am Relieved by:  Nothing Worsened by:  Nothing tried Ineffective treatments:  None tried Associated symptoms: no back pain, no chest pain, no chest pressure, no cough, no diaphoresis, no dizziness, no nausea, no numbness, no shortness of breath, no vomiting and no weakness   Risk factors: no hx of atrial fibrillation, no hx of DVT, no hx of PE, no hypercoagulable state and no hyperthyroidism     Past Medical History  Diagnosis Date  . CAD (coronary artery disease) CABG x1 2001; PCI 2008    s/p LIMA-LAD in2001 with AVR; BMS to LCx 8/08  . Allergy   . Diverticulitis   . GERD (gastroesophageal reflux disease)   . Hypertension   . Osteoarthritis   . Osteoporosis   . MALT lymphoma   . Parkinson's disease   . ASCVD (arteriosclerotic cardiovascular disease) 06/08    TIA  . Hyperlipidemia   . ED (erectile dysfunction)   . Aortic valve stenosis, acquired 2001    s/p Porcine AVR; by 05/2009 Echo - EF 45-50%, moderate AS -- AVA 0.98 cm2, peak gradient  . Right renal artery stenosis 2012    90% stenosis - stable  . S/P cardiac pacemaker  procedure, Medtronic Adapta L  ADDRL1, 01/21/12 01/22/2012   Past Surgical History  Procedure Laterality Date  . Coronary artery bypass graft  2001    Zenaida Niece trigt ) aortic valve replacement 2001; LIMA-LAD.  Marland Kitchen Esophagogastroduodenoscopy      multiple, Colon/ EGD benign 11/2004  . Pilonidal cyst / sinus excision  1953  . Inguinal hernia repair      LIH 1997Llap. bilateral hernias 1998  . Coronary stent placement  11/2007    8/09  Left Circumflex Stent by Dr Rosie Fate started and aggrenox stopped  . Cataract extraction  06/09    left  . Laparoscopic cholecystectomy  07/11    Dr.Rosenbower  . Squamous cell carcinoma excision  3/13    back  . Aortic valve replacement  2001    Porcine valve  . Pacemaker insertion  01/2012    bradycardia   Family History  Problem Relation Age of Onset  . Heart disease Brother    History  Substance Use Topics  . Smoking status: Former Smoker    Types: Cigarettes    Quit date: 06/05/1961  . Smokeless tobacco: Never Used  . Alcohol Use: Yes     Comment: very rare    Review of Systems  Constitutional: Negative for fever, diaphoresis, activity change, appetite change and fatigue.  HENT: Negative for congestion, facial swelling, rhinorrhea and trouble swallowing.   Eyes: Negative for photophobia  and pain.  Respiratory: Negative for cough, chest tightness and shortness of breath.   Cardiovascular: Positive for palpitations. Negative for chest pain and leg swelling.  Gastrointestinal: Negative for nausea, vomiting, abdominal pain, diarrhea and constipation.  Endocrine: Negative for polydipsia and polyuria.  Genitourinary: Negative for dysuria, urgency, decreased urine volume and difficulty urinating.  Musculoskeletal: Negative for back pain and gait problem.  Skin: Negative for color change, rash and wound.  Allergic/Immunologic: Negative for immunocompromised state.  Neurological: Negative for dizziness, facial asymmetry, speech difficulty,  weakness, numbness and headaches.  Psychiatric/Behavioral: Negative for confusion, decreased concentration and agitation.    Allergies  Penicillins  Home Medications   Current Outpatient Rx  Name  Route  Sig  Dispense  Refill  . aspirin 81 MG tablet   Oral   Take 81 mg by mouth daily.         . bisoprolol-hydrochlorothiazide (ZIAC) 10-6.25 MG per tablet   Oral   Take 1 tablet by mouth daily.         . carbidopa-levodopa (SINEMET CR) 50-200 MG per tablet   Oral   Take 1 tablet by mouth. Eight times a day         . carbidopa-levodopa (SINEMET) 25-100 MG per tablet   Oral   Take 2 tablets by mouth 4 (four) times daily.   720 tablet   1   . hydrochlorothiazide (MICROZIDE) 12.5 MG capsule   Oral   Take 12.5 mg by mouth daily.         . polyethylene glycol (MIRALAX / GLYCOLAX) packet   Oral   Take 17 g by mouth daily.         . vitamin B-12 (CYANOCOBALAMIN) 1000 MCG tablet   Oral   Take 1,000 mcg by mouth daily.          BP 123/54  Pulse 70  Temp(Src) 98.4 F (36.9 C) (Oral)  Resp 20  Ht 5\' 8"  (1.727 m)  Wt 152 lb (68.947 kg)  BMI 23.12 kg/m2  SpO2 93% Physical Exam  Constitutional: He is oriented to person, place, and time. He appears well-developed and well-nourished. No distress.  HENT:  Head: Normocephalic and atraumatic.  Mouth/Throat: No oropharyngeal exudate.  Eyes: Pupils are equal, round, and reactive to light.  Neck: Normal range of motion. Neck supple.  Cardiovascular: Normal rate and regular rhythm.  Exam reveals no gallop and no friction rub.   Murmur heard.  Systolic murmur is present with a grade of 3/6  Pulmonary/Chest: Effort normal and breath sounds normal. No respiratory distress. He has no wheezes. He has no rales.  Abdominal: Soft. Bowel sounds are normal. He exhibits no distension and no mass. There is no tenderness. There is no rebound and no guarding.  Musculoskeletal: Normal range of motion. He exhibits no edema and no  tenderness.  Neurological: He is alert and oriented to person, place, and time.  Skin: Skin is warm and dry.  Psychiatric: He has a normal mood and affect.    ED Course  Procedures (including critical care time) Labs Review Labs Reviewed  BASIC METABOLIC PANEL - Abnormal; Notable for the following:    Glucose, Bld 109 (*)    GFR calc non Af Amer 85 (*)    All other components within normal limits  CBC WITH DIFFERENTIAL  POCT I-STAT TROPONIN I   Imaging Review Dg Chest Portable 1 View  01/07/2013   CLINICAL DATA:  Tachycardia, history of Parkinson's  EXAM: PORTABLE CHEST -  1 VIEW  COMPARISON:  01/22/2012  FINDINGS: Lungs are clear. No pleural effusion or pneumothorax.  Cardiomegaly. Postsurgical changes related to prior CABG. Left subclavian pacemaker.  IMPRESSION: No evidence of acute cardiopulmonary disease.   Electronically Signed   By: Charline Bills M.D.   On: 01/07/2013 07:39     Date: 01/07/2013  Rate: 68   Rhythm: ventricular pacing  QRS Axis: left  Intervals: indeterminate  ST/T Wave abnormalities: nonspecific ST/T changes  Conduction Disutrbances:paced rhythm  Narrative Interpretation:   Old EKG Reviewed: changes noted, previous full EKG done prior to pacemaker insertion, shows AV block w/ severe bradycardia  7:57 PM Have spoken to Med[employer] rep, not arrythmia reported by device, but rate that would be reported is >150.  Device set to pace at 60-120.    MDM   1. Palpitations    Pt is a 79 y.o. male with Pmhx as above who presents with sensation of tachycardia.  Pt states he woke up this morning, not feeling right, took pulse & found it to be high. He was unable to go back to sleep, took pulse again around 4am, then again arouns 5am w/ BP cuff and was around 120.  Pt currently feeling better. Denies CP, SOB, n/v, diaphoresis.  No med changes.  EKG herewith paced rhythm as above.  CXR unremarkable, trop not elevated.  Have spoken to Dr. Herbie Baltimore at [facility] cardiology who  will set up pt w/ device clinic on Monday.  I believe pt safe for d/c home.  Return precautions given for new or worsening symptoms including return of symptoms, chest pain, trouble breathing, fever.   1. Palpitations         Shanna Cisco, MD 01/07/13 [phone]

## 2013-01-07 NOTE — ED Notes (Signed)
Reports he woke at 2am and felt something was not right, then took Parkinson's med, went back to sleep then woke again at 4am.   When tachycardia did not stop, he called wife to check his readings. EMS reports he has been as low as 90's and high of 120's en route. Denies pain

## 2013-01-07 NOTE — Telephone Encounter (Signed)
Mr. Benally called because his discharge papers from the hospital stated he should be at our office at 0645 tomorrow AM..  I reviewed chart and told the pt that the paper does state that, but we did not, open that early.  He was seen for tachycardia.  Our office will contact him in the AM to check devise though he made it sound that it was remotely checked in the ER.  Our office will investigate and call him tomorrow.  His pacemaker is followed by Dr. Royann Shivers.

## 2013-01-09 NOTE — Telephone Encounter (Signed)
Appt. Scheduled for 01/12/13 w/Dr. C.

## 2013-01-10 ENCOUNTER — Ambulatory Visit: Payer: Medicare Other | Admitting: Cardiovascular Disease

## 2013-01-11 ENCOUNTER — Encounter: Payer: Self-pay | Admitting: *Deleted

## 2013-01-12 ENCOUNTER — Ambulatory Visit (INDEPENDENT_AMBULATORY_CARE_PROVIDER_SITE_OTHER): Payer: Medicare Other | Admitting: Cardiovascular Disease

## 2013-01-12 ENCOUNTER — Encounter: Payer: Self-pay | Admitting: Cardiovascular Disease

## 2013-01-12 VITALS — BP 118/60 | HR 62 | Resp 16 | Ht 68.0 in | Wt 152.8 lb

## 2013-01-12 DIAGNOSIS — Z950 Presence of cardiac pacemaker: Secondary | ICD-10-CM | POA: Diagnosis not present

## 2013-01-12 DIAGNOSIS — I350 Nonrheumatic aortic (valve) stenosis: Secondary | ICD-10-CM

## 2013-01-12 DIAGNOSIS — I359 Nonrheumatic aortic valve disorder, unspecified: Secondary | ICD-10-CM

## 2013-01-12 DIAGNOSIS — I2510 Atherosclerotic heart disease of native coronary artery without angina pectoris: Secondary | ICD-10-CM

## 2013-01-12 MED ORDER — TADALAFIL 20 MG PO TABS: 20.0000 mg | ORAL_TABLET | Freq: Every day | ORAL | Status: DC | PRN

## 2013-01-12 NOTE — Patient Instructions (Addendum)
STOP hydrochlorothiazide. Take only the bisoprolol/hydrochlorothiazide 10/6.25 mg once daily. Your physician recommends that you schedule a follow-up appointment in: 6 months. Please do a remote pacemaker check in 3 months (April 13, 2013).

## 2013-01-13 LAB — REMOTE PACEMAKER DEVICE
AL AMPLITUDE: 1 mv
BAMS-0001: 150 {beats}/min
BATTERY VOLTAGE: 2.8 V
RV LEAD THRESHOLD: 0.875 V
VENTRICULAR PACING PM: 99.3

## 2013-01-16 NOTE — Assessment & Plan Note (Signed)
Most recent echocardiogram performed in 2013 shows a normally functioning aortic valve biological prosthesis with a slight increase in mean gradient to 28 mm Hg suggestive of some degree of degeneration. It will be worthwhile following this on a yearly basis since there appeared to be progressive disease and the valve was now 76 years old. Unfortunately he may not be a good candidate for any redo surgery. On the other hand his biological prosthesis is fairly large 25 mm, and he may be a candidate for TAVR.

## 2013-01-16 NOTE — Progress Notes (Signed)
Patient ID: Ian [name], male   DOB: 1928/09/29, 79 y.o.   MRN: [id number]     Reason for office visit Followup pacemaker/second degree AV block, coronary and valvular heart disease  Ian [name] has extensive cardiac problems including a dual-chamber permanent pacemaker for severe bradycardia secondary to 2:1 AV block, 13 years status post aortic valve replacement with a biological prosthesis and coronary disease(single-vessel LIMA to LAD in 2001, stents to the circumflex coronary artery in 2009).  Despite all these cardiac problems currently his major limitations appear to be related to Parkinson's disease. He has worsening tremor. He also has significant orthostatic hypotension that might be related to treatment with Sinemet. His medicine list shows both hydrochlorothiazide and a combination distal pole/hydrochlorothiazide and asked him to make sure that he is not taking both these medications. I think he should be on bisoprolol 10/hydrochlorothiazide 6.25 mg daily, in the hopes that the beta blocker will provide both cardioprotective effect and help with his tremor. His blood pressure today is under excellent control. In fact I think we should allow his blood pressure to rise a little higher to prevent the symptoms of orthostatic hypotension. He has rare edema limited to his feet and has fatigue and shortness of breath on exertion. He does not have chest pain.     Allergies  Allergen Reactions  . Plavix [Clopidogrel]     Fatigue   . Pravastatin   . Zocor [Simvastatin]     REACTION: dizziness  . Penicillins Rash    Current Outpatient Prescriptions  Medication Sig Dispense Refill  . aspirin 81 MG tablet Take 81 mg by mouth daily.      . bisoprolol-hydrochlorothiazide (ZIAC) 10-6.25 MG per tablet Take 1 tablet by mouth daily.      . carbidopa-levodopa (SINEMET CR) 50-200 MG per tablet Take 2 tablets by mouth 4 (four) times daily.       . carbidopa-levodopa (SINEMET) 25-100 MG per  tablet Take 2 tablets by mouth 4 (four) times daily.  720 tablet  1  . hydrochlorothiazide (MICROZIDE) 12.5 MG capsule Take 12.5 mg by mouth daily.      . polyethylene glycol (MIRALAX / GLYCOLAX) packet Take 17 g by mouth daily.      . vitamin B-12 (CYANOCOBALAMIN) 1000 MCG tablet Take 1,000 mcg by mouth daily.      . tadalafil (CIALIS) 20 MG tablet Take 1 tablet (20 mg total) by mouth daily as needed for erectile dysfunction.  10 tablet  3   No current facility-administered medications for this visit.    Past Medical History  Diagnosis Date  . CAD (coronary artery disease) CABG x1 2001; PCI 2008    s/p LIMA-LAD in2001 with AVR; BMS to LCx 8/08  . Allergy   . Diverticulitis   . GERD (gastroesophageal reflux disease)   . Hypertension   . Osteoarthritis   . Osteoporosis   . MALT lymphoma   . Parkinson's disease   . ASCVD (arteriosclerotic cardiovascular disease) 06/08    TIA  . Hyperlipidemia   . ED (erectile dysfunction)   . Aortic valve stenosis, acquired 2001    s/p Porcine AVR; by 05/2009 Echo - EF 45-50%, moderate AS -- AVA 0.98 cm2, peak gradient  . Right renal artery stenosis 2012    90% stenosis - stable  . S/P cardiac pacemaker procedure, Medtronic Adapta L  ADDRL1, 01/21/12 01/22/2012    Past Surgical History  Procedure Laterality Date  . Coronary artery bypass graft  2001    (  Zenaida Niece trigt ) aortic valve replacement 2001; LIMA-LAD.  Marland Kitchen Esophagogastroduodenoscopy      multiple, Colon/ EGD benign 11/2004  . Pilonidal cyst / sinus excision  1953  . Inguinal hernia repair      LIH 1997Llap. bilateral hernias 1998  . Coronary stent placement  11/2007    8/09  Left Circumflex Stent by Dr Rosie Fate started and aggrenox stopped  . Cataract extraction  06/09    left  . Laparoscopic cholecystectomy  07/11    Dr.Rosenbower  . Squamous cell carcinoma excision  3/13    back  . Aortic valve replacement  2001    Porcine valve  . Pacemaker insertion  01/21/2012     Medtronic  . Nm myocar perf wall motion  11/22/2007    mild septal ischemia,EF 66%    Family History  Problem Relation Age of Onset  . Heart disease Brother   . Asthma Father     History   Social History  . Marital Status: Married    Spouse Name: N/A    Number of Children: 3  . Years of Education: N/A   Occupational History  . retired- [employer]   . part-time sub/courier for schools- now only rarely    Social History Main Topics  . Smoking status: Former Smoker -- 1.00 packs/day for 5 years    Types: Cigarettes    Quit date: 06/06/1963  . Smokeless tobacco: Never Used  . Alcohol Use: Yes     Comment: very rare  . Drug Use: No  . Sexual Activity: No   Other Topics Concern  . Not on file   Social History Narrative   Has living will   Wife, then [name], hold health care POA   Requests DNR after discussion   Requests no tube feeds if cognitively unaware    Review of systems: The patient specifically denies any chest pain at rest or with exertion, dyspnea at rest, orthopnea, paroxysmal nocturnal dyspnea, syncope, palpitations, intermittent claudication, lower extremity edema, unexplained weight gain, cough, hemoptysis or wheezing.  The patient also denies abdominal pain, nausea, vomiting, dysphagia, diarrhea, constipation, polyuria, polydipsia, dysuria, hematuria, frequency, urgency, abnormal bleeding or bruising, fever, chills, unexpected weight changes, mood swings, change in skin or hair texture, change in voice quality, auditory or visual problems, allergic reactions or rashes, new musculoskeletal complaints other than usual "aches and pains".   PHYSICAL EXAM BP 118/60  Pulse 62  Resp 16  Ht 5\' 8"  (1.727 m)  Wt 152 lb 12.8 oz (69.31 kg)  BMI 23.24 kg/m2  General: Alert, oriented x3, no distress Head: no evidence of trauma, PERRL, EOMI, no exophtalmos or lid lag, no myxedema, no xanthelasma; normal ears, nose and oropharynx Neck: normal jugular  venous pulsations and no hepatojugular reflux; brisk carotid pulses without delay and no carotid bruits Chest: clear to auscultation, no signs of consolidation by percussion or palpation, normal fremitus, symmetrical and full respiratory excursions; LV sternotomy scar and subclavian pacemaker site Cardiovascular: normal position and quality of the apical impulse, regular rhythm, normal first and second heart sounds, no murmurs, rubs or gallops Abdomen: no tenderness or distention, no masses by palpation, no abnormal pulsatility or arterial bruits, normal bowel sounds, no hepatosplenomegaly Extremities: no clubbing, cyanosis or edema; 2+ radial, ulnar and brachial pulses bilaterally; 2+ right femoral, posterior tibial and dorsalis pedis pulses; 2+ left femoral, posterior tibial and dorsalis pedis pulses; no subclavian or femoral bruits Neurological: Resting tremor is very prominent in the  right hand but also noticed on the other side. Tremor persists even with activity although it improves.   EKG: Atrial sensed ventricular paced  Lipid Panel     Component Value Date/Time   CHOL 163 03/29/2012 1138   TRIG 75.0 03/29/2012 1138   HDL 54.80 03/29/2012 1138   CHOLHDL 3 03/29/2012 1138   VLDL 15.0 03/29/2012 1138   LDLCALC 93 03/29/2012 1138    BMET    Component Value Date/Time   NA 137 01/07/2013 0653   K 3.6 01/07/2013 0653   CL 99 01/07/2013 0653   CO2 31 01/07/2013 0653   GLUCOSE 109* 01/07/2013 0653   BUN 14 01/07/2013 0653   CREATININE 0.73 01/07/2013 0653   CALCIUM 9.2 01/07/2013 0653   GFRNONAA 85* 01/07/2013 0653   GFRAA >90 01/07/2013 0653     ASSESSMENT AND PLAN Aortic valve stenosis, acquired -- s/p AVR with Porcie Bioprosthetic AVR; By Echo in 05/2009 moderate stenosis,AVA 0.98cm. Most recent echocardiogram performed in 2013 shows a normally functioning aortic valve biological prosthesis with a slight increase in mean gradient to 28 mm Hg suggestive of some degree of degeneration.  It will be worthwhile following this on a yearly basis since there appeared to be progressive disease and the valve was now 77 years old. Unfortunately he may not be a good candidate for any redo surgery. On the other hand his biological prosthesis is fairly large 25 mm, and he may be a candidate for TAVR.  S/P cardiac pacemaker procedure, Medtronic Adapta L  ADDRL1, 01/21/12 Normal dual chamber Medtronic pacemaker function. Will check in the office today. He has virtually 100% ventricular pacing but is not technically pacemaker dependent no permanent changes were made to his device programming today.  CORONARY ARTERY DISEASE --  s/p LIMA-LAD & PCI to LCx It has been 13 years since bypass surgery (LIMA to the LAD) and 5 years since placement of a stent to the mid circumflex coronary artery(4 x 28 mm MultiLink vision). He has no symptoms of coronary insufficiency. His most recent functional study was performed in 2009. He has preserved left ventricular systolic function   Orders Placed This Encounter  Procedures  . EKG 12-Lead   Meds ordered this encounter  Medications  . tadalafil (CIALIS) 20 MG tablet    Sig: Take 1 tablet (20 mg total) by mouth daily as needed for erectile dysfunction.    Dispense:  10 tablet    Refill:  3    Semone Orlov  Thurmon Fair, MD, [facility] [phone] office [phone] pager

## 2013-01-16 NOTE — Assessment & Plan Note (Signed)
Normal dual chamber Medtronic pacemaker function. Will check in the office today. He has virtually 100% ventricular pacing but is not technically pacemaker dependent no permanent changes were made to his device programming today.

## 2013-01-16 NOTE — Assessment & Plan Note (Signed)
It has been 13 years since bypass surgery (LIMA to the LAD) and 5 years since placement of a stent to the mid circumflex coronary artery(4 x 28 mm MultiLink vision). He has no symptoms of coronary insufficiency. His most recent functional study was performed in 2009. He has preserved left ventricular systolic function

## 2013-01-18 ENCOUNTER — Telehealth: Payer: Self-pay | Admitting: *Deleted

## 2013-01-18 DIAGNOSIS — I359 Nonrheumatic aortic valve disorder, unspecified: Secondary | ICD-10-CM

## 2013-01-18 NOTE — Telephone Encounter (Signed)
LM that he needs a repeat echo before year end due to changes on his AOV.  Order placed for echo.

## 2013-01-18 NOTE — Telephone Encounter (Signed)
Message copied by Vita Barley on Thu Jan 18, 2013  1:13 PM ------      Message from: Thurmon Fair      Created: Tue Jan 16, 2013  6:35 PM       Looked over his old echo is and see a pattern of worsening valve function (aortic valve bioprosthesis). Recommend that we perform another echocardiogram before the end of the year. ------

## 2013-02-22 ENCOUNTER — Ambulatory Visit: Payer: Medicare Other | Admitting: Neurology

## 2013-03-05 ENCOUNTER — Ambulatory Visit (HOSPITAL_COMMUNITY)
Admission: RE | Admit: 2013-03-05 | Discharge: 2013-03-05 | Disposition: A | Discharge status: Home / Self Care | Payer: Medicare Other | Source: Ambulatory Visit | Attending: Cardiovascular Disease | Admitting: Cardiovascular Disease

## 2013-03-05 DIAGNOSIS — I359 Nonrheumatic aortic valve disorder, unspecified: Secondary | ICD-10-CM | POA: Diagnosis not present

## 2013-03-05 DIAGNOSIS — E119 Type 2 diabetes mellitus without complications: Secondary | ICD-10-CM | POA: Diagnosis not present

## 2013-03-05 DIAGNOSIS — I10 Essential (primary) hypertension: Secondary | ICD-10-CM | POA: Diagnosis not present

## 2013-03-05 DIAGNOSIS — Z09 Encounter for follow-up examination after completed treatment for conditions other than malignant neoplasm: Secondary | ICD-10-CM | POA: Insufficient documentation

## 2013-03-05 DIAGNOSIS — I2510 Atherosclerotic heart disease of native coronary artery without angina pectoris: Secondary | ICD-10-CM | POA: Insufficient documentation

## 2013-03-05 DIAGNOSIS — Z952 Presence of prosthetic heart valve: Secondary | ICD-10-CM | POA: Diagnosis not present

## 2013-03-05 NOTE — Progress Notes (Signed)
2D Echo Performed 03/05/2013    Karan Ramnauth, RCS  

## 2013-03-06 ENCOUNTER — Ambulatory Visit (INDEPENDENT_AMBULATORY_CARE_PROVIDER_SITE_OTHER): Payer: Medicare Other | Admitting: Neurology

## 2013-03-06 ENCOUNTER — Encounter: Payer: Self-pay | Admitting: Neurology

## 2013-03-06 VITALS — BP 122/72 | HR 80 | Temp 98.0°F | Resp 12 | Ht 68.0 in | Wt 158.2 lb

## 2013-03-06 DIAGNOSIS — E538 Deficiency of other specified B group vitamins: Secondary | ICD-10-CM | POA: Diagnosis not present

## 2013-03-06 DIAGNOSIS — G20 Parkinson's disease: Secondary | ICD-10-CM

## 2013-03-06 LAB — VITAMIN B12: Vitamin B-12: 660 pg/mL (ref 211–911)

## 2013-03-06 MED ORDER — CLONAZEPAM 0.5 MG PO TABS: 0.5000 mg | ORAL_TABLET | Freq: Every day | ORAL | Status: DC

## 2013-03-06 NOTE — Progress Notes (Signed)
Ian [name] was seen today in the movement disorders clinic for f/u.  He is accompanied by his wife who supplements the history.  The first symptom(s) the patient noticed was tremor in the R hand when he would use the hand or have a cup of coffee in it.  His wife believes that this began in 2004.  He was referred to Dr. Lance [name].  He tried some medication and according to his wife, he kept having adverse SE.  He was on mirapex and sinemet but reported dizziness and bad dreams with both.   He was referred to Dr. Harrel [name] in about 2010 because he was interested in DBS but the patient was concerned about risks of the surgery and potential side effects.    09/28/12  I tried to change the levodopa back to the IR formulation but he did not like it because he was too drowsy and went back to the CR formulation.  , 2 po qid and carbidopa/levodopa 50/200 at night.    He has continued to have more tremor after the artane was d/c.  However, this has become less bothersome for him over time.  He does notice that sometimes the medication wears off quickly and sometimes it does not.  It seems inconsistent.  03/06/13 update:  The patient is accompanied by his wife who supplements the history.  Last visit, we tried to add entacapone to each of the 4 carbidopa/levodopa dosing times.  He called me because of nausea and we decreased it to twice a day dosing.  He continued to have nausea and the medication was discontinued.  The patient states that the medication makes his arms feel "heavy." He has stopped the carbidopa/levodopa 50/200 at night as he does not think it was beneficial.  Sometimes, he will wake up in the middle of the night and take an extra of the carbidopa/levodopa CR 25 100.  He and his wife have noted some jerking spells at night.  He sleeps very restless. I did review notes in his chart since last visit.  He went to the emergency room on September 21 with a sensation of palpitations and tachycardia.  His  pacemaker was interrogated and there were no problems and he was subsequently sent home.   Wearing off:  yes  How long before next dose:  Inconsistent in timing, but generally worn off within 3 hours Falls:   no, but has had near falls. N/V:  no Hallucinations:  no  visual distortions: no Lightheaded:  no  Syncope: no Dyskinesia:  no  He did have a mild B12 def with a level of 318 and did start an oral supplement since last visit.  Neuroimaging has previously been performed.  It is not available for my review today.  PREVIOUS MEDICATIONS: Sinemet (reported bad dreams/dizziness on regular formulation and same with Mirapex); requip;  on Sinemet CR currently, entacapone (felt dizzy and arms felt "heavy")  ALLERGIES:   Allergies  Allergen Reactions  . Plavix [Clopidogrel]     Fatigue   . Pravastatin   . Zocor [Simvastatin]     REACTION: dizziness  . Penicillins Rash    CURRENT MEDICATIONS:  Current Outpatient Prescriptions on File Prior to Visit  Medication Sig Dispense Refill  . aspirin 81 MG tablet Take 81 mg by mouth daily.      . bisoprolol-hydrochlorothiazide (ZIAC) 10-6.25 MG per tablet Take 1 tablet by mouth daily.      . carbidopa-levodopa (SINEMET) 25-100 MG per tablet Take  2 tablets by mouth 4 (four) times daily.  720 tablet  1  . hydrochlorothiazide (MICROZIDE) 12.5 MG capsule Take 12.5 mg by mouth daily.      . polyethylene glycol (MIRALAX / GLYCOLAX) packet Take 17 g by mouth daily.      . tadalafil (CIALIS) 20 MG tablet Take 1 tablet (20 mg total) by mouth daily as needed for erectile dysfunction.  10 tablet  3  . vitamin B-12 (CYANOCOBALAMIN) 1000 MCG tablet Take 1,000 mcg by mouth daily.       No current facility-administered medications on file prior to visit.    PAST MEDICAL HISTORY:   Past Medical History  Diagnosis Date  . CAD (coronary artery disease) CABG x1 2001; PCI 2008    s/p LIMA-LAD in2001 with AVR; BMS to LCx 8/08  . Allergy   .  Diverticulitis   . GERD (gastroesophageal reflux disease)   . Hypertension   . Osteoarthritis   . Osteoporosis   . MALT lymphoma   . Parkinson's disease   . ASCVD (arteriosclerotic cardiovascular disease) 06/08    TIA  . Hyperlipidemia   . ED (erectile dysfunction)   . Aortic valve stenosis, acquired 2001    s/p Porcine AVR; by 05/2009 Echo - EF 45-50%, moderate AS -- AVA 0.98 cm2, peak gradient  . Right renal artery stenosis 2012    90% stenosis - stable  . S/P cardiac pacemaker procedure, Medtronic Adapta L  ADDRL1, 01/21/12 01/22/2012    PAST SURGICAL HISTORY:   Past Surgical History  Procedure Laterality Date  . Coronary artery bypass graft  2001    Zenaida Niece trigt ) aortic valve replacement 2001; LIMA-LAD.  Marland Kitchen Esophagogastroduodenoscopy      multiple, Colon/ EGD benign 11/2004  . Pilonidal cyst / sinus excision  1953  . Inguinal hernia repair      LIH 1997Llap. bilateral hernias 1998  . Coronary stent placement  11/2007    8/09  Left Circumflex Stent by Dr Rosie Fate started and aggrenox stopped  . Cataract extraction  06/09    left  . Laparoscopic cholecystectomy  07/11    Dr.Rosenbower  . Squamous cell carcinoma excision  3/13    back  . Aortic valve replacement  2001    Porcine valve  . Pacemaker insertion  01/21/2012    Medtronic  . Nm myocar perf wall motion  11/22/2007    mild septal ischemia,EF 66%    SOCIAL HISTORY:   History   Social History  . Marital Status: Married    Spouse Name: N/A    Number of Children: 3  . Years of Education: N/A   Occupational History  . retired- [employer]   . part-time sub/courier for schools- now only rarely    Social History Main Topics  . Smoking status: Former Smoker -- 1.00 packs/day for 5 years    Types: Cigarettes    Quit date: 06/06/1963  . Smokeless tobacco: Never Used  . Alcohol Use: No     Comment: very rare  . Drug Use: No  . Sexual Activity: No   Other Topics Concern  . Not on file    Social History Narrative   Has living will   Wife, then son Ian [name], hold health care POA   Requests DNR after discussion   Requests no tube feeds if cognitively unaware    FAMILY HISTORY:   Family Status  Relation Status Death Age  . Mother Deceased 29  old age  . Father Deceased 18    acute bronchitis, cotton exposure  . Sister Deceased     trauma  . Brother Deceased     2, deceased  . Daughter Deceased 16    osteosarcoma  . Child Alive     3, healthy  . Brother Alive     healthy  . Sister Alive     healthy    ROS:  Has problems with chronic constipation.  Has been on metamucil and dulcolax without significant relief.  He has tried miralax but only 2 times.  Been on fibercon.  A complete 10 system review of systems was obtained and was unremarkable apart from what is mentioned above.  PHYSICAL EXAMINATION:    VITALS:   Filed Vitals:   03/06/13 1044  BP: 122/72  Pulse: 80  Temp: 98 F (36.7 C)  Resp: 12  Height: 5\' 8"  (1.727 m)  Weight: 158 lb 3.2 oz (71.759 kg)    GEN:  The patient appears stated age and is in NAD. HEENT:  Normocephalic, atraumatic.  The mucous membranes are moist. The superficial temporal arteries are without ropiness or tenderness. CV:  RRR Lungs:  CTAB Neck/HEME:  There are no carotid bruits bilaterally.  Neurological examination:  Orientation: The patient is alert and oriented x3.  He is a very good historian today. Cranial nerves: There is good facial symmetry. Pupils are equal round and reactive to light bilaterally. There are no square wave jerks.  Fundoscopic exam is attempted but the disc margins are not well visualized bilaterally.Extraocular muscles are intact. The visual fields are full to confrontational testing. The speech is fluent and clear. Soft palate rises symmetrically and there is no tongue deviation. Hearing is intact to conversational tone. Sensation: Sensation is intact to light and pinprick throughout (facial,  trunk, extremities). Vibration is decreased at the bilateral big toe and in a distal fashion. There is no extinction with double simultaneous stimulation. There is no sensory dermatomal level identified. Motor: Strength is 5/5 in the bilateral upper and lower extremities.   Shoulder shrug is equal and symmetric.  There is no pronator drift. Deep tendon reflexes: Deep tendon reflexes are 2/4 at the bilateral biceps, triceps, brachioradialis, patella and absent at the bilateral achilles. Plantar responses are downgoing bilaterally.  Movement examination: Tone: There is normal tone in the bilateral upper extremities today.  The tone in the lower extremities is normal.  Abnormal movements: There is a moderate RUE resting tremor and mild-mod LUE resting tremor.  No dyskinesia. Coordination:  There is miminal decremation with RAM's, seen mostly with hand opening and closing on the R Gait and Station: The patient has no difficulty arising out of a deep-seated chair without the use of the hands. The patient's stride length is normal.  The patient has a negative pull test.      LABS:  Lab Results  Component Value Date   WBC 8.5 01/07/2013   HGB 14.0 01/07/2013   HCT 41.2 01/07/2013   MCV 92.0 01/07/2013   PLT 250 01/07/2013     Chemistry      Component Value Date/Time   NA 137 01/07/2013 0653   K 3.6 01/07/2013 0653   CL 99 01/07/2013 0653   CO2 31 01/07/2013 0653   BUN 14 01/07/2013 0653   CREATININE 0.73 01/07/2013 0653      Component Value Date/Time   CALCIUM 9.2 01/07/2013 0653   ALKPHOS 60 03/29/2012 1138   AST 12 03/29/2012 1138  ALT 3 03/29/2012 1138   BILITOT 0.6 03/29/2012 1138     Lab Results  Component Value Date   VITAMINB12 318 09/04/2012   Lab Results  Component Value Date   TSH 2.10 03/29/2012     ASSESSMENT/PLAN:  1.  Idiopathic Parkinsons disease.    -We discussed the diagnosis as well as pathophysiology of the disease.  We discussed treatment options as well as  prognostic indicators.  Patient education was provided.  -He was unable to take the IR form of levodopa and has gone back to the 25/100 CR, 2 po qid   Not surprisingly, he has noted inconsistent release with the CR.  We talked about Rytary but it is still not yet to the market.  For now, I am going to have him take his dosage is somewhat closer together.  He will continue to take 2 tablets in the morning and then will spread out the other 6 evenly throughout the day.  2.  Mild cognitive impairment, likely from longstanding PD.  -He is really doing much better in this regard since being off of the artane.  3.  Constipation.  -This is very common in patients with Parkinson's disease. He has the rancho recipe. 4.  B12 deficiency.  -I am going to recheck his B12 level today 5.  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  -We will initiate clonazepam, 0.5 mg at night.  He will try half tablet. 5.   Return in about 3 months (around 06/06/2013). we talked about the importance of continued exercise, and he was given information on the Parkinson's exercise program.

## 2013-03-06 NOTE — Patient Instructions (Signed)
1.  Start klonopin - 0.5 mg - 1/2 tablet before bedtime for a week.  If it is helping, then stay at that dose.  If you continue to have the jerking and restlessness after a week, increase to one tablet at night 2.  Altamese Cabal Christmas and have a great Thanksgiving!

## 2013-03-07 ENCOUNTER — Telehealth: Payer: Self-pay

## 2013-03-07 NOTE — Telephone Encounter (Signed)
Called pt and relayed your message. 

## 2013-03-07 NOTE — Telephone Encounter (Signed)
Message copied by Lou Cal on Wed Mar 07, 2013 10:00 AM ------      Message from: TAT, REBECCA S      Created: Wed Mar 07, 2013  8:12 AM       Please let pt know that his B12 looks much better and that he should continue the supplement.  Thanks, Maralyn Sago ------

## 2013-03-12 ENCOUNTER — Telehealth: Payer: Self-pay

## 2013-03-12 NOTE — Telephone Encounter (Signed)
Pt's wife called and explained that the clonazepam was making pt feel really bad. His arms and legs were hurting and felt sore so he stopped taking it over the weekend. He doesn't want to take it again.

## 2013-04-18 ENCOUNTER — Ambulatory Visit (INDEPENDENT_AMBULATORY_CARE_PROVIDER_SITE_OTHER): Payer: Medicare Other | Admitting: Internal Medicine

## 2013-04-18 ENCOUNTER — Encounter: Payer: Self-pay | Admitting: Internal Medicine

## 2013-04-18 ENCOUNTER — Encounter: Payer: Self-pay | Admitting: Cardiovascular Disease

## 2013-04-18 VITALS — BP 110/60 | HR 69 | Temp 98.2°F | Ht 68.0 in | Wt 157.0 lb

## 2013-04-18 DIAGNOSIS — I10 Essential (primary) hypertension: Secondary | ICD-10-CM

## 2013-04-18 DIAGNOSIS — Z0000 Encounter for general adult medical examination without abnormal findings: Secondary | ICD-10-CM | POA: Diagnosis not present

## 2013-04-18 DIAGNOSIS — I359 Nonrheumatic aortic valve disorder, unspecified: Secondary | ICD-10-CM

## 2013-04-18 DIAGNOSIS — C8583 Other specified types of non-Hodgkin lymphoma, intra-abdominal lymph nodes: Secondary | ICD-10-CM

## 2013-04-18 DIAGNOSIS — G20 Parkinson's disease: Secondary | ICD-10-CM

## 2013-04-18 DIAGNOSIS — I350 Nonrheumatic aortic (valve) stenosis: Secondary | ICD-10-CM

## 2013-04-18 MED ORDER — IPRATROPIUM BROMIDE 0.03 % NA SOLN: 2.0000 | Freq: Three times a day (TID) | NASAL | Status: DC

## 2013-04-18 MED ORDER — SILDENAFIL CITRATE 20 MG PO TABS: 60.0000 mg | ORAL_TABLET | Freq: Every day | ORAL | Status: DC | PRN

## 2013-04-18 NOTE — Progress Notes (Signed)
Pre-visit discussion using our clinic review tool. No additional management support is needed unless otherwise documented below in the visit note.  

## 2013-04-18 NOTE — Assessment & Plan Note (Signed)
Ongoing symptoms Working with Dr Arbutus Leas

## 2013-04-18 NOTE — Assessment & Plan Note (Signed)
Mild dizziness that seems related to Parkinsons

## 2013-04-18 NOTE — Assessment & Plan Note (Signed)
BP Readings from Last 3 Encounters:  04/18/13 110/60  03/06/13 122/72  01/12/13 118/60   Good control Continue beta blocker given recent symptoms of tachycardia

## 2013-04-18 NOTE — Assessment & Plan Note (Signed)
I have personally reviewed the Medicare Annual Wellness questionnaire and have noted 1. The patient's medical and social history 2. Their use of alcohol, tobacco or illicit drugs 3. Their current medications and supplements 4. The patient's functional ability including ADL's, fall risks, home safety risks and hearing or visual             impairment. 5. Diet and physical activities 6. Evidence for depression or mood disorders  The patients weight, height, BMI and visual acuity have been recorded in the chart I have made referrals, counseling and provided education to the patient based review of the above and I have provided the pt with a written personalized care plan for preventive services.  I have provided you with a copy of your personalized plan for preventive services. Please take the time to review along with your updated medication list.  No cancer screening due to age UTD on immunizations 

## 2013-04-18 NOTE — Progress Notes (Signed)
Subjective:    Patient ID: Ian [name], male    DOB: Dec 07, 1928, 79 y.o.   MRN: [id number]  HPI Here for Medicare wellness and follow up Reviewed form Falls due to Parkinsons. No significant injury. No depression or anhedonia Independent with ADLs and some help with instrumental ADLs (washes dishes, laundry, sweeps) Doesn't drive Tries to exercise regularly No tobacco or alcohol Memory is better Vision is a problem--did try new prescription recently. Hard to read now Hearing is okay  Has ongoing bothersome clear rhinorrhea Went to ENT and got some spray--didn't help Discussed options Wondered if his tear on the left is responsible---he has to pick to relieve buildup  Parkinsons control is still an issue Has tried a couple of new meds that caused problems Tremors are still an issue  Still has some memory problems Wife feels it is better than it was Dr Tat thought it was related to artane--that he is off now  No chest pain Did have some palpitations--- pulse was 121 upon awakening 1 day Pulse normal again upon evaluation in ER Pacer has been checked  Stomach has been okay Occasional vague abdominal pain---brief Off prevacid for now---hasn't had heartburn problems recently No swallowing problems  Current Outpatient Prescriptions on File Prior to Visit  Medication Sig Dispense Refill  . aspirin 81 MG tablet Take 81 mg by mouth daily.      . bisoprolol-hydrochlorothiazide (ZIAC) 10-6.25 MG per tablet Take 1 tablet by mouth daily.      . carbidopa-levodopa (SINEMET) 25-100 MG per tablet Take 2 tablets by mouth 4 (four) times daily.  720 tablet  1  . polyethylene glycol (MIRALAX / GLYCOLAX) packet Take 17 g by mouth daily.      . vitamin B-12 (CYANOCOBALAMIN) 1000 MCG tablet Take 1,000 mcg by mouth daily.       No current facility-administered medications on file prior to visit.    Allergies  Allergen Reactions  . Plavix [Clopidogrel]     Fatigue   .  Pravastatin   . Zocor [Simvastatin]     REACTION: dizziness  . Penicillins Rash    Past Medical History  Diagnosis Date  . CAD (coronary artery disease) CABG x1 2001; PCI 2008    s/p LIMA-LAD in2001 with AVR; BMS to LCx 8/08  . Allergy   . Diverticulitis   . GERD (gastroesophageal reflux disease)   . Hypertension   . Osteoarthritis   . Osteoporosis   . MALT lymphoma   . Parkinson's disease   . ASCVD (arteriosclerotic cardiovascular disease) 06/08    TIA  . Hyperlipidemia   . ED (erectile dysfunction)   . Aortic valve stenosis, acquired 2001    s/p Porcine AVR; by 05/2009 Echo - EF 45-50%, moderate AS -- AVA 0.98 cm2, peak gradient  . Right renal artery stenosis 2012    90% stenosis - stable  . S/P cardiac pacemaker procedure, Medtronic Adapta L  ADDRL1, 01/21/12 01/22/2012    Past Surgical History  Procedure Laterality Date  . Coronary artery bypass graft  2001    Zenaida Niece trigt ) aortic valve replacement 2001; LIMA-LAD.  Marland Kitchen Esophagogastroduodenoscopy      multiple, Colon/ EGD benign 11/2004  . Pilonidal cyst / sinus excision  1953  . Inguinal hernia repair      LIH 1997Llap. bilateral hernias 1998  . Coronary stent placement  11/2007    8/09  Left Circumflex Stent by Dr Rosie Fate started and aggrenox stopped  . Cataract  extraction  06/09    left  . Laparoscopic cholecystectomy  07/11    Dr.Rosenbower  . Squamous cell carcinoma excision  3/13    back  . Aortic valve replacement  2001    Porcine valve  . Pacemaker insertion  01/21/2012    Medtronic  . Nm myocar perf wall motion  11/22/2007    mild septal ischemia,EF 66%    Family History  Problem Relation Age of Onset  . Heart disease Brother   . Asthma Father     History   Social History  . Marital Status: Married    Spouse Name: N/A    Number of Children: 3  . Years of Education: N/A   Occupational History  . retired- [employer]   . part-time sub/courier for schools- now only rarely     Social History Main Topics  . Smoking status: Former Smoker -- 1.00 packs/day for 5 years    Types: Cigarettes    Quit date: 06/06/1963  . Smokeless tobacco: Never Used  . Alcohol Use: No     Comment: very rare  . Drug Use: No  . Sexual Activity: No   Other Topics Concern  . Not on file   Social History Narrative   Has living will   Wife, then [name], hold health care POA   Requests DNR --done   Requests no tube feeds if cognitively unaware   Review of Systems Sleep isn't great. Awakens during night 2-3 times. Occasional daytime somnolence Short nap once in a while Bowels okay with current regimen Occasional slow urine stream or dribbling. Rare burning dysuria Not sure he will continue with cialis--due to cost    Objective:   Physical Exam  Constitutional: He is oriented to person, place, and time. He appears well-developed and well-nourished. No distress.  HENT:  Mouth/Throat: Oropharynx is clear and moist. No oropharyngeal exudate.  Neck: Normal range of motion. No thyromegaly present.  Cardiovascular: Normal rate, regular rhythm and intact distal pulses.  Exam reveals no gallop.   Murmur heard. Gr 2/6 aortic systolic murmur  Pulmonary/Chest: Effort normal and breath sounds normal. No respiratory distress. He has no wheezes. He has no rales.  Abdominal: Soft. There is no tenderness.  Musculoskeletal: He exhibits no edema and no tenderness.  Lymphadenopathy:    He has no cervical adenopathy.  Neurological: He is alert and oriented to person, place, and time.  President-- "Obama, Bush, Clinton" [id number] Recall 3/3  Moderate resting tremor Normal tone Mild bradykinesia  Skin: No rash noted.  Psychiatric: He has a normal mood and affect. His behavior is normal.          Assessment & Plan:

## 2013-04-18 NOTE — Assessment & Plan Note (Signed)
Seems to be inactive Off PPI now

## 2013-04-23 ENCOUNTER — Other Ambulatory Visit: Payer: Self-pay

## 2013-04-23 MED ORDER — CARBIDOPA-LEVODOPA 25-100 MG PO TABS: 2.0000 | ORAL_TABLET | Freq: Four times a day (QID) | ORAL | Status: DC

## 2013-04-23 NOTE — Telephone Encounter (Signed)
Sarah, please check with the pt as I think that he is on the CR formulation now.  You may refill whichever he has for 6 months

## 2013-04-26 ENCOUNTER — Telehealth: Payer: Self-pay | Admitting: Internal Medicine

## 2013-04-26 NOTE — Telephone Encounter (Signed)
Form done 

## 2013-04-26 NOTE — Telephone Encounter (Signed)
Form on your desk  

## 2013-04-26 NOTE — Telephone Encounter (Signed)
Pt dropped off DMV Handi-Cap Sticker form. Put it on Dee's Desk. Please call pt when ready for pick up [phone].

## 2013-06-05 ENCOUNTER — Ambulatory Visit: Payer: Medicare Other | Admitting: Neurology

## 2013-06-11 ENCOUNTER — Ambulatory Visit (INDEPENDENT_AMBULATORY_CARE_PROVIDER_SITE_OTHER): Payer: Medicare Other | Admitting: Neurology

## 2013-06-11 ENCOUNTER — Encounter: Payer: Self-pay | Admitting: Neurology

## 2013-06-11 VITALS — BP 138/70 | HR 92 | Resp 16 | Ht 68.0 in | Wt 156.2 lb

## 2013-06-11 DIAGNOSIS — G20 Parkinson's disease: Secondary | ICD-10-CM

## 2013-06-11 DIAGNOSIS — E538 Deficiency of other specified B group vitamins: Secondary | ICD-10-CM | POA: Diagnosis not present

## 2013-06-11 MED ORDER — AMANTADINE HCL 100 MG PO CAPS: 100.0000 mg | ORAL_CAPSULE | Freq: Three times a day (TID) | ORAL | Status: DC

## 2013-06-11 NOTE — Patient Instructions (Addendum)
1. Please call us before your next appointment to make sure Ritary is available. 2. You can take up to 14 tablets of Carbidopa-Levodopa a day, as needed.  3. Call with any problems, we will see you in 5 weeks.  4. [place] [facility] does have the Shingles vaccine available. You can call them at [phone] to schedule a good time to have this done. They may be able to call in the vaccine to your pharmacy - which may be a cheaper alternative. Please call them to discuss.

## 2013-06-11 NOTE — Progress Notes (Signed)
Ian [name] was seen today in the movement disorders clinic for f/u.  He is accompanied by his wife who supplements the history.  The first symptom(s) the patient noticed was tremor in the R hand when he would use the hand or have a cup of coffee in it.  His wife believes that this began in 2004.  He was referred to Dr. Tilden Dome.  He tried some medication and according to his wife, he kept having adverse SE.  He was on mirapex and sinemet but reported dizziness and bad dreams with both.   He was referred to Dr. Yevonne Pax in about 2010 because he was interested in DBS but the patient was concerned about risks of the surgery and potential side effects.    09/28/12  I tried to change the levodopa back to the IR formulation but he did not like it because he was too drowsy and went back to the CR formulation.  , 2 po qid and carbidopa/levodopa 50/200 at night.    He has continued to have more tremor after the artane was d/c.  However, this has become less bothersome for him over time.  He does notice that sometimes the medication wears off quickly and sometimes it does not.  It seems inconsistent.  03/06/13 update:  The patient is accompanied by his wife who supplements the history.  Last visit, we tried to add entacapone to each of the 4 carbidopa/levodopa dosing times.  He called me because of nausea and we decreased it to twice a day dosing.  He continued to have nausea and the medication was discontinued.  The patient states that the medication makes his arms feel "heavy." He has stopped the carbidopa/levodopa 50/200 at night as he does not think it was beneficial.  Sometimes, he will wake up in the middle of the night and take an extra of the carbidopa/levodopa CR 25 100.  He and his wife have noted some jerking spells at night.  He sleeps very restless. I did review notes in his chart since last visit.  He went to the emergency room on September 21 with a sensation of palpitations and tachycardia.  His  pacemaker was interrogated and there were no problems and he was subsequently sent home.  06/11/13 update:  This patient is accompanied in the office by his spouse who supplements the history.  Pt is on carbidopa/levodopa 25/100 CR - 2 in the AM and 6 other dosages throughout the day.  When he awakens in the middle of the night he may take a few extra dosages and he estimates he takes a total daily levodopa dose of 1000-1200 mg per day.  He has been very senstive to medication and has not been able to tolerate the immediate release carbidopa/levodopa, entacapone or klonopin (made arms/legs hurt).  Tremor feels like it is getting worse.  Vision is getting worse but he went to the eye doctor and his vision was stable.  He feels like the levodopa only lasts 1.5-2 hrs.  No falls.  Is exercising.    Hallucinations:  no  visual distortions: no Lightheaded:  no  Syncope: no Dyskinesia:  no  He did have a mild B12 def with a level of 318 and did start an oral supplement since last visit.  Neuroimaging has previously been performed.  It is not available for my review today.  PREVIOUS MEDICATIONS: Sinemet (reported bad dreams/dizziness on regular formulation and same with Mirapex); requip;  on Sinemet CR currently, entacapone (felt  dizzy and arms felt "heavy"); klonopin (made arms/legs hurt); artane  ALLERGIES:   Allergies  Allergen Reactions  . Plavix [Clopidogrel]     Fatigue   . Pravastatin   . Zocor [Simvastatin]     REACTION: dizziness  . Penicillins Rash    CURRENT MEDICATIONS:  Current Outpatient Prescriptions on File Prior to Visit  Medication Sig Dispense Refill  . bisoprolol-hydrochlorothiazide (ZIAC) 10-6.25 MG per tablet Take 1 tablet by mouth daily.      . carbidopa-levodopa (SINEMET) 25-100 MG per tablet Take 2 tablets by mouth 4 (four) times daily.  720 tablet  1  . ipratropium (ATROVENT) 0.03 % nasal spray Place 2 sprays into both nostrils 3 (three) times daily.  30 mL  12  .  polyethylene glycol (MIRALAX / GLYCOLAX) packet Take 17 g by mouth daily.      . sildenafil (REVATIO) 20 MG tablet Take 3-5 tablets (60-100 mg total) by mouth daily as needed.  50 tablet  11  . vitamin B-12 (CYANOCOBALAMIN) 1000 MCG tablet Take 1,000 mcg by mouth daily.       No current facility-administered medications on file prior to visit.    PAST MEDICAL HISTORY:   Past Medical History  Diagnosis Date  . CAD (coronary artery disease) CABG x1 2001; PCI 2008    s/p LIMA-LAD in2001 with AVR; BMS to LCx 8/08  . Allergy   . Diverticulitis   . GERD (gastroesophageal reflux disease)   . Hypertension   . Osteoarthritis   . Osteoporosis   . MALT lymphoma   . Parkinson's disease   . ASCVD (arteriosclerotic cardiovascular disease) 06/08    TIA  . Hyperlipidemia   . ED (erectile dysfunction)   . Aortic valve stenosis, acquired 2001    s/p Porcine AVR; by 05/2009 Echo - EF 45-50%, moderate AS -- AVA 0.98 cm2, peak gradient 30mmHg  . Right renal artery stenosis 2012    90% stenosis - stable  . S/P cardiac pacemaker procedure, Medtronic Adapta L  ADDRL1, 01/21/12 01/22/2012    PAST SURGICAL HISTORY:   Past Surgical History  Procedure Laterality Date  . Coronary artery bypass graft  2001    Lucianne Lei trigt ) aortic valve replacement 2001; LIMA-LAD.  Marland Kitchen Esophagogastroduodenoscopy      multiple, Colon/ EGD benign 11/2004  . Pilonidal cyst / sinus excision  1953  . Inguinal hernia repair      LIH 1997Llap. bilateral hernias 1998  . Coronary stent placement  11/2007    8/09  Left Circumflex Stent by Dr Toy Care started and aggrenox stopped  . Cataract extraction  06/09    left  . Laparoscopic cholecystectomy  07/11    Dr.Rosenbower  . Squamous cell carcinoma excision  3/13    back  . Aortic valve replacement  2001    Porcine valve  . Pacemaker insertion  01/21/2012    Medtronic  . Nm myocar perf wall motion  11/22/2007    mild septal ischemia,EF 66%    SOCIAL HISTORY:   History    Social History  . Marital Status: Married    Spouse Name: N/A    Number of Children: 3  . Years of Education: N/A   Occupational History  . retired- [employer]   . part-time sub/courier for schools- now only rarely    Social History Main Topics  . Smoking status: Former Smoker -- 1.00 packs/day for 5 years    Types: Cigarettes    Quit date:  06/06/1963  . Smokeless tobacco: Never Used  . Alcohol Use: No     Comment: very rare  . Drug Use: No  . Sexual Activity: No   Other Topics Concern  . Not on file   Social History Narrative   Has living will   Wife, then son [name], hold health care POA   Requests DNR --done   Requests no tube feeds if cognitively unaware    FAMILY HISTORY:   Family Status  Relation Status Death Age  . Mother Deceased 64    old age  . Father Deceased 21    acute bronchitis, cotton exposure  . Sister Deceased     trauma  . Brother Deceased     2, deceased  . Daughter Deceased 69    osteosarcoma  . Child Alive     3, healthy  . Brother Alive     healthy  . Sister Alive     healthy    ROS:    A complete 10 system review of systems was obtained and was unremarkable apart from what is mentioned above.  PHYSICAL EXAMINATION:    VITALS:   Filed Vitals:   06/11/13 1011  BP: 138/70  Pulse: 92  Resp: 16  Height: 5\' 8"  (1.727 m)  Weight: 156 lb 3 oz (70.846 kg)    GEN:  The patient appears stated age and is in NAD. HEENT:  Normocephalic, atraumatic.  The mucous membranes are moist. The superficial temporal arteries are without ropiness or tenderness. CV:  RRR Lungs:  CTAB Neck/HEME:  There are no carotid bruits bilaterally.  Neurological examination:  Orientation: The patient is alert and oriented x3.  He is a very good historian today.  Movement examination: Tone: There is normal tone in the bilateral upper extremities today.  The tone in the lower extremities is normal.  Abnormal movements: There is a moderate RUE  resting tremor and mild-mod LUE resting tremor.  No dyskinesia. Coordination:  There is miminal decremation with RAM's, seen mostly with hand opening and closing on the R Gait and Station: The patient has mild difficulty arising out of a deep-seated chair without the use of the hands. The patient's stride length is normal.  He has postural instability.    Lab Results  Component Value Date   WBC 8.5 01/07/2013   HGB 14.0 01/07/2013   HCT 41.2 01/07/2013   MCV 92.0 01/07/2013   PLT 250 01/07/2013     Chemistry      Component Value Date/Time   NA 137 01/07/2013 0653   K 3.6 01/07/2013 0653   CL 99 01/07/2013 0653   CO2 31 01/07/2013 0653   BUN 14 01/07/2013 0653   CREATININE 0.73 01/07/2013 0653      Component Value Date/Time   CALCIUM 9.2 01/07/2013 0653   ALKPHOS 60 03/29/2012 1138   AST 12 03/29/2012 1138   ALT 3 03/29/2012 1138   BILITOT 0.6 03/29/2012 1138     Lab Results  Component Value Date   VITAMINB12 660 03/06/2013   Lab Results  Component Value Date   TSH 2.10 03/29/2012     ASSESSMENT/PLAN:  1.  Idiopathic Parkinsons disease.    -We discussed the diagnosis as well as pathophysiology of the disease.  We discussed treatment options as well as prognostic indicators.  Patient education was provided.  -He was unable to take the IR form of levodopa and has gone back to the 25/100 CR, 10-12 tablets per day.  We  discussed Rytary and Duopa and the role that they play in PD management.  I think he is a good candidate for both but a particularly good candidate for duopa.  He wants to hold on that and try Rytary once it is launched, which is supposed to be at the end of March.  For now, he can take up to 14 tablets of the CR carbidopa/levodopa 25/100 per day.  -We will add amantadine 100 mg tid to see if that will help tremor. 2.  Mild cognitive impairment, likely from longstanding PD.  -He is really doing much better in this regard since being off of the artane.  3.   Constipation.  -This is very common in patients with Parkinson's disease. He has the rancho recipe. 4.  B12 deficiency.  -He is on oral supplements and his last level was 660 when rechecked. 5.  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  -He could not tolerate klonopin. 6.   Return in about 5 weeks (around 07/16/2013).

## 2013-06-22 ENCOUNTER — Ambulatory Visit (INDEPENDENT_AMBULATORY_CARE_PROVIDER_SITE_OTHER): Payer: Medicare Other | Admitting: Cardiovascular Disease

## 2013-06-22 ENCOUNTER — Encounter: Payer: Self-pay | Admitting: Cardiovascular Disease

## 2013-06-22 VITALS — BP 104/72 | HR 82 | Resp 16 | Ht 68.0 in | Wt 155.8 lb

## 2013-06-22 DIAGNOSIS — R001 Bradycardia, unspecified: Secondary | ICD-10-CM

## 2013-06-22 DIAGNOSIS — I350 Nonrheumatic aortic (valve) stenosis: Secondary | ICD-10-CM

## 2013-06-22 DIAGNOSIS — I441 Atrioventricular block, second degree: Secondary | ICD-10-CM

## 2013-06-22 DIAGNOSIS — I359 Nonrheumatic aortic valve disorder, unspecified: Secondary | ICD-10-CM

## 2013-06-22 DIAGNOSIS — I4729 Other ventricular tachycardia: Secondary | ICD-10-CM

## 2013-06-22 DIAGNOSIS — I472 Ventricular tachycardia: Secondary | ICD-10-CM

## 2013-06-22 DIAGNOSIS — I498 Other specified cardiac arrhythmias: Secondary | ICD-10-CM | POA: Diagnosis not present

## 2013-06-22 DIAGNOSIS — I2510 Atherosclerotic heart disease of native coronary artery without angina pectoris: Secondary | ICD-10-CM

## 2013-06-22 LAB — MDC_IDC_ENUM_SESS_TYPE_INCLINIC
Battery Remaining Longevity: 143 mo
Lead Channel Impedance Value: 556 Ohm
Lead Channel Pacing Threshold Amplitude: 0.75 V
Lead Channel Pacing Threshold Pulse Width: 0.4 ms
Lead Channel Sensing Intrinsic Amplitude: 4 mV
Lead Channel Sensing Intrinsic Amplitude: 5.6 mV
Lead Channel Setting Pacing Amplitude: 2 V
Lead Channel Setting Pacing Pulse Width: 0.4 ms
MDC IDC MSMT BATTERY IMPEDANCE: 112 Ohm
MDC IDC MSMT BATTERY VOLTAGE: 2.8 V
MDC IDC MSMT LEADCHNL RA IMPEDANCE VALUE: 476 Ohm
MDC IDC MSMT LEADCHNL RA PACING THRESHOLD AMPLITUDE: 0.5 V
MDC IDC MSMT LEADCHNL RA PACING THRESHOLD PULSEWIDTH: 0.4 ms
MDC IDC SESS DTM: 20150306162825
MDC IDC SET LEADCHNL RV PACING AMPLITUDE: 2.5 V
MDC IDC SET LEADCHNL RV SENSING SENSITIVITY: 2.8 mV
MDC IDC STAT BRADY AP VP PERCENT: 62 %
MDC IDC STAT BRADY AP VS PERCENT: 0 %
MDC IDC STAT BRADY AS VP PERCENT: 38 %
MDC IDC STAT BRADY AS VS PERCENT: 0 %

## 2013-06-22 NOTE — Patient Instructions (Addendum)
Remote monitoring is used to monitor your pacemaker from home. This monitoring reduces the number of office visits required to check your device to one time per year. It allows [place] to keep an eye on the functioning of your device to ensure it is working properly. You are scheduled for a device check from home on 09-24-2013. You may send your transmission at any time that day. If you have a wireless device, the transmission will be sent automatically. After your physician reviews your transmission, you will receive a postcard with your next transmission date.  Your physician has requested that you have an echocardiogram. Echocardiography is a painless test that uses sound waves to create images of your heart. It provides your doctor with information about the size and shape of your heart and how well your heart's chambers and valves are working. This procedure takes approximately one hour. There are no restrictions for this procedure.   Your physician recommends that you schedule a follow-up appointment in: 12 months with Dr.Croitoru

## 2013-06-24 DIAGNOSIS — I4729 Other ventricular tachycardia: Secondary | ICD-10-CM | POA: Insufficient documentation

## 2013-06-24 DIAGNOSIS — I472 Ventricular tachycardia: Secondary | ICD-10-CM | POA: Insufficient documentation

## 2013-06-24 NOTE — Assessment & Plan Note (Signed)
His episodes of VT are brief and asymptomatic. He has ischemic heart disease but fairly good left ventricular systolic function. Unfortunately he cannot tolerate higher doses of beta blocker due to severe symptomatic orthostatic hypotension.

## 2013-06-24 NOTE — Progress Notes (Signed)
Patient ID: Ian [name], male   DOB: 1928-07-17, 80 y.o.   MRN: [id number]     Reason for office visit Valvular heart disease, CAD status post CABG, secondary heart block status post pacemaker, renal artery stenosis   Ian [name] has extensive cardiac problems including a dual-chamber permanent pacemaker for severe bradycardia secondary to 2:1 AV block, 13 years status post aortic valve replacement with a biological prosthesis and coronary disease(single-vessel LIMA to LAD in 2001, stents to the circumflex coronary artery in 2009). 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 The most recent study performed in November showed a mean gradient of 18 mm Hg and a calculated valve area of around 1.3 cm square. LVEF was 50-55% Currently his biggest problem remains Parkinson's disease. His tremor interferes with eating and social interactions. He has orthostatic hypotension although the symptoms are less prominent than at his last visit. His blood pressure remains well-controlled. He does complain of edema, but this is never severe. He still is trying to exercise and goes to the fitness center 3 times a week where he exercises for 30-45 minutes. He does get dyspneic when exercising but not usual daily activities. Pacemaker check today shows normal device function. The presenting rhythm is atrial sensed ventricular paced and he does pace the ventricle virtually 100% of the time. He has complete heart block now but is not technically pacemaker dependent since he has a fairly fast junctional escape rhythm at 50-58 beats per minute. His device has reported a few episodes of nonsustained ventricular tachycardia including one that was fairly long and 17 beats. This happened just 2 days ago. He was asymptomatic.  Allergies  Allergen Reactions  . Plavix [Clopidogrel]     Fatigue   . Pravastatin   . Zocor [Simvastatin]     REACTION: dizziness  . Penicillins Rash      Current Outpatient Prescriptions  Medication Sig Dispense Refill  . amantadine (SYMMETREL) 100 MG capsule Take 1 capsule (100 mg total) by mouth 3 (three) times daily.  270 capsule  3  . aspirin 325 MG tablet Take 325 mg by mouth daily.      . bisoprolol-hydrochlorothiazide (ZIAC) 10-6.25 MG per tablet Take 1 tablet by mouth daily.      . carbidopa-levodopa (SINEMET) 25-100 MG per tablet Take 2 tablets by mouth 4 (four) times daily.  720 tablet  1  . ipratropium (ATROVENT) 0.03 % nasal spray Place 2 sprays into both nostrils 3 (three) times daily.  30 mL  12  . polyethylene glycol (MIRALAX / GLYCOLAX) packet Take 17 g by mouth daily.      . sildenafil (REVATIO) 20 MG tablet Take 3-5 tablets (60-100 mg total) by mouth daily as needed.  50 tablet  11  . vitamin B-12 (CYANOCOBALAMIN) 1000 MCG tablet Take 1,000 mcg by mouth daily.       No current facility-administered medications for this visit.    Past Medical History  Diagnosis Date  . CAD (coronary artery disease) CABG x1 2001; PCI 2008    s/p [name] in2001 with AVR; BMS to LCx 8/08  . Allergy   . Diverticulitis   . GERD (gastroesophageal reflux disease)   . Hypertension   . Osteoarthritis   . Osteoporosis   . MALT lymphoma   . Parkinson's disease   . ASCVD (arteriosclerotic cardiovascular disease) 06/08    TIA  . Hyperlipidemia   . ED (erectile dysfunction)   . Aortic valve stenosis, acquired 2001  s/p Porcine AVR; by 05/2009 Echo - EF 45-50%, moderate AS -- AVA 0.98 cm2, peak gradient 55mmHg  . Right renal artery stenosis 2012    90% stenosis - stable  . S/P cardiac pacemaker procedure, Medtronic Adapta L  ADDRL1, 01/21/12 01/22/2012    Past Surgical History  Procedure Laterality Date  . Coronary artery bypass graft  2001    Ian [name].  Marland Kitchen Esophagogastroduodenoscopy      multiple, Colon/ EGD benign 11/2004  . Pilonidal cyst / sinus excision  1953  . Inguinal hernia repair       LIH 1997Llap. bilateral hernias 1998  . Coronary stent placement  11/2007    8/09  Left Circumflex Stent by Ian [name] started and aggrenox stopped  . Cataract extraction  06/09    left  . Laparoscopic cholecystectomy  07/11    Ian [name]  . Squamous cell carcinoma excision  3/13    back  . Aortic valve replacement  2001    Porcine valve  . Pacemaker insertion  01/21/2012    Medtronic  . Nm myocar perf wall motion  11/22/2007    mild septal ischemia,EF 66%    Family History  Problem Relation Age of Onset  . Heart disease Brother   . Asthma Father     History   Social History  . Marital Status: Married    Spouse Name: N/A    Number of Children: 3  . Years of Education: N/A   Occupational History  . retired- [employer]   . part-time sub/courier for schools- now only rarely    Social History Main Topics  . Smoking status: Former Smoker -- 1.00 packs/day for 5 years    Types: Cigarettes    Quit date: 06/06/1963  . Smokeless tobacco: Never Used  . Alcohol Use: No     Comment: very rare  . Drug Use: No  . Sexual Activity: No   Other Topics Concern  . Not on file   Social History Narrative   Has living will   Wife, then son Ian [name], hold health [name] POA   Requests DNR --done   Requests no tube feeds if cognitively unaware    Review of systems: The patient specifically denies any chest pain at rest or with exertion, dyspnea at rest, orthopnea, paroxysmal nocturnal dyspnea, syncope, palpitations, intermittent claudication, lower extremity edema, unexplained weight gain, cough, hemoptysis or wheezing.  The patient also denies abdominal pain, nausea, vomiting, dysphagia, diarrhea, constipation, polyuria, polydipsia, dysuria, hematuria, frequency, urgency, abnormal bleeding or bruising, fever, chills, unexpected weight changes, mood swings, change in skin or hair texture, change in voice quality, auditory or visual problems, allergic reactions or  rashes, new musculoskeletal complaints other than usual "aches and pains".   PHYSICAL EXAM BP 104/72  Pulse 82  Resp 16  Ht 5\' 8"  (1.727 m)  Wt 70.67 kg (155 lb 12.8 oz)  BMI 23.69 kg/m2 General: Alert, oriented x3, no distress  Head: no evidence of trauma, PERRL, EOMI, no exophtalmos or lid lag, no myxedema, no xanthelasma; normal ears, nose and oropharynx  Neck: normal jugular venous pulsations and no hepatojugular reflux; brisk carotid pulses without delay and no carotid bruits  Chest: clear to auscultation, no signs of consolidation by percussion or palpation, normal fremitus, symmetrical and full respiratory excursions; LV sternotomy scar and subclavian pacemaker site  Cardiovascular: normal position and quality of the apical impulse, regular rhythm, normal first and second heart sounds,  no murmurs, rubs or gallops  Abdomen: no tenderness or distention, no masses by palpation, no abnormal pulsatility or arterial bruits, normal bowel sounds, no hepatosplenomegaly  Extremities: no clubbing, cyanosis or edema; 2+ radial, ulnar and brachial pulses bilaterally; 2+ right femoral, posterior tibial and dorsalis pedis pulses; 2+ left femoral, posterior tibial and dorsalis pedis pulses; no subclavian or femoral bruits  Neurological: Resting tremor is very prominent in the right hand but also noticed on the other side. Tremor persists even with activity although it improves.   EKG: Atrial sensed ventricular paced   Lipid Panel     Component Value Date/Time   CHOL 163 03/29/2012 1138   TRIG 75.0 03/29/2012 1138   HDL 54.80 03/29/2012 1138   CHOLHDL 3 03/29/2012 1138   VLDL 15.0 03/29/2012 1138   LDLCALC 93 03/29/2012 1138    BMET    Component Value Date/Time   NA 137 01/07/2013 0653   K 3.6 01/07/2013 0653   CL 99 01/07/2013 0653   CO2 31 01/07/2013 0653   GLUCOSE 109* 01/07/2013 0653   BUN 14 01/07/2013 0653   CREATININE 0.73 01/07/2013 0653   CALCIUM 9.2 01/07/2013 0653   GFRNONAA 85*  01/07/2013 0653   GFRAA >90 01/07/2013 0653     ASSESSMENT AND PLAN Nonsustained ventricular tachycardia His episodes of VT are brief and asymptomatic. He has ischemic heart disease but fairly good left ventricular systolic function. Unfortunately he cannot tolerate higher doses of beta blocker due to severe symptomatic orthostatic hypotension.  Aortic valve stenosis, acquired -- s/p AVR with Porcine Bioprosthetic AVR; By Echo in 05/2009 moderate stenosis,AVA 0.98cm. Some degree of prosthetic valve degeneration, but without clear evidence of clinical impact and with only mild to moderately increased gradients. Check another echocardiogram in about 6 months but if the gradients remain stable will space out the evaluation to less than yearly echoes.  CORONARY ARTERY DISEASE --  s/p [name] & PCI to LCx No current symptoms of acute coronary insufficiency  Patient Instructions  Remote monitoring is used to monitor your pacemaker from home. This monitoring reduces the number of office visits required to check your device to one time per year. It allows [place] to keep an eye on the functioning of your device to ensure it is working properly. You are scheduled for a device check from home on 09-24-2013. You may send your transmission at any time that day. If you have a wireless device, the transmission will be sent automatically. After your physician reviews your transmission, you will receive a postcard with your next transmission date.  Your physician has requested that you have an echocardiogram. Echocardiography is a painless test that uses sound waves to create images of your heart. It provides your doctor with information about the size and shape of your heart and how well your heart's chambers and valves are working. This procedure takes approximately one hour. There are no restrictions for this procedure.   Your physician recommends that you schedule a follow-up appointment in: 12 months with  Ian.Desi Rowe         Orders Placed This Encounter  Procedures  . EKG 12-Lead  . 2D Echocardiogram without contrast   No orders of the defined types were placed in this encounter.    Holli Humbles, MD, [place] [phone] office [phone] pager

## 2013-06-24 NOTE — Assessment & Plan Note (Signed)
No current symptoms of acute coronary insufficiency

## 2013-06-24 NOTE — Assessment & Plan Note (Signed)
Some degree of prosthetic valve degeneration, but without clear evidence of clinical impact and with only mild to moderately increased gradients. Check another echocardiogram in about 6 months but if the gradients remain stable will space out the evaluation to the yearly echoes.

## 2013-07-02 ENCOUNTER — Telehealth: Payer: Self-pay | Admitting: Cardiovascular Disease

## 2013-07-02 MED ORDER — BISOPROLOL-HYDROCHLOROTHIAZIDE 10-6.25 MG PO TABS: 1.0000 | ORAL_TABLET | Freq: Every day | ORAL | Status: DC

## 2013-07-02 NOTE — Telephone Encounter (Signed)
Returned call to patient. Needs refill of Ziac (reports that he requested refill at [place] on 3/6 to be sent to new mail order [facility] but was never refilled). RN refilled medication and offered that a short supply be sent to local pharmacy. Patient denied this offer.

## 2013-07-02 NOTE — Telephone Encounter (Signed)
Per the answering service from Saturday-Need a reflil,but did not know the name of the medicine. Please call on Monday.

## 2013-07-12 ENCOUNTER — Telehealth: Payer: Self-pay | Admitting: Neurology

## 2013-07-12 NOTE — Telephone Encounter (Signed)
I think its best to move it out a few weeks.

## 2013-07-12 NOTE — Telephone Encounter (Signed)
Since we don't have a Rep yet, would you like me to r/s his appt?

## 2013-07-12 NOTE — Telephone Encounter (Signed)
Spoke with patient and appt r/s to the end of April. He agrees with this plan.

## 2013-07-12 NOTE — Telephone Encounter (Signed)
Pt's spouse called wanting to confirm if the medication for Parkinson's is available for her spouse to take (does not remember the name). She states that Dr. Carles Collet had advise her that if the medication was not available to cancel the follow up appt he has scheduled for 07/16/13. Please call pt's spouse to confirm.

## 2013-07-16 ENCOUNTER — Ambulatory Visit: Payer: Medicare Other | Admitting: Neurology

## 2013-08-09 ENCOUNTER — Telehealth: Payer: Self-pay | Admitting: Neurology

## 2013-08-09 NOTE — Telephone Encounter (Signed)
Spoke with wife and made her aware medication available. They will keep appt on Monday.

## 2013-08-09 NOTE — Telephone Encounter (Signed)
Pt has appt w/ Dr. Carles Collet next week to discuss Rytary. Wife wants to know if the med is available? Please call back [phone] / Sherri S.

## 2013-08-13 ENCOUNTER — Ambulatory Visit (INDEPENDENT_AMBULATORY_CARE_PROVIDER_SITE_OTHER): Payer: Medicare Other | Admitting: Neurology

## 2013-08-13 ENCOUNTER — Encounter: Payer: Self-pay | Admitting: Neurology

## 2013-08-13 VITALS — BP 122/64 | HR 86 | Resp 16 | Ht 68.0 in | Wt 147.0 lb

## 2013-08-13 DIAGNOSIS — I2510 Atherosclerotic heart disease of native coronary artery without angina pectoris: Secondary | ICD-10-CM

## 2013-08-13 DIAGNOSIS — G20 Parkinson's disease: Secondary | ICD-10-CM | POA: Diagnosis not present

## 2013-08-13 DIAGNOSIS — G20A1 Parkinson's disease without dyskinesia, without mention of fluctuations: Secondary | ICD-10-CM

## 2013-08-13 MED ORDER — CARBIDOPA-LEVODOPA ER 36.25-145 MG PO CPCR: 3.0000 | ORAL_CAPSULE | Freq: Three times a day (TID) | ORAL | Status: DC

## 2013-08-13 MED ORDER — CARBIDOPA-LEVODOPA ER 23.75-95 MG PO CPCR: 3.0000 | ORAL_CAPSULE | Freq: Three times a day (TID) | ORAL | Status: DC

## 2013-08-13 NOTE — Patient Instructions (Addendum)
1. You have been given samples of Rytary 36.25/145mg . Please take three tablets 3 times daily - 30 minutes prior to meals. Let us know how you are doing on the medication in about 3-4 days.

## 2013-08-13 NOTE — Progress Notes (Signed)
Ian [name] was seen today in the movement disorders clinic for f/u.  He is accompanied by his wife who supplements the history.  The first symptom(s) the patient noticed was tremor in the R hand when he would use the hand or have a cup of coffee in it.  His wife believes that this began in 2004.  He was referred to Dr. Tilden Dome.  He tried some medication and according to his wife, he kept having adverse SE.  He was on mirapex and sinemet but reported dizziness and bad dreams with both.   He was referred to Dr. Yevonne Pax in about 2010 because he was interested in DBS but the patient was concerned about risks of the surgery and potential side effects.    09/28/12  I tried to change the levodopa back to the IR formulation but he did not like it because he was too drowsy and went back to the CR formulation.  , 2 po qid and carbidopa/levodopa 50/200 at night.    He has continued to have more tremor after the artane was d/c.  However, this has become less bothersome for him over time.  He does notice that sometimes the medication wears off quickly and sometimes it does not.  It seems inconsistent.  03/06/13 update:  The patient is accompanied by his wife who supplements the history.  Last visit, we tried to add entacapone to each of the 4 carbidopa/levodopa dosing times.  He called me because of nausea and we decreased it to twice a day dosing.  He continued to have nausea and the medication was discontinued.  The patient states that the medication makes his arms feel "heavy." He has stopped the carbidopa/levodopa 50/200 at night as he does not think it was beneficial.  Sometimes, he will wake up in the middle of the night and take an extra of the carbidopa/levodopa CR 25 100.  He and his wife have noted some jerking spells at night.  He sleeps very restless. I did review notes in his chart since last visit.  He went to the emergency room on September 21 with a sensation of palpitations and tachycardia.  His  pacemaker was interrogated and there were no problems and he was subsequently sent home.  06/11/13 update:  This patient is accompanied in the office by his spouse who supplements the history.  Pt is on carbidopa/levodopa 25/100 CR - 2 in the AM and 6 other dosages throughout the day.  When he awakens in the middle of the night he may take a few extra dosages and he estimates he takes a total daily levodopa dose of 1000-1200 mg per day.  He has been very senstive to medication and has not been able to tolerate the immediate release carbidopa/levodopa, entacapone or klonopin (made arms/legs hurt).  Tremor feels like it is getting worse.  Vision is getting worse but he went to the eye doctor and his vision was stable.  He feels like the levodopa only lasts 1.5-2 hrs.  No falls.  Is exercising.    08/13/13 update:   This patient is accompanied in the office by his spouse who supplements the history.  Pt is on carbidopa/levodopa 25/100, and takes approximately 8-10 tablets per day.  Last visit, I also added amantadine to see if that would help the tremor. He does think that has helped.  He presents today to try and change to rytary to see if it would last longer.  He does not wish to  try duopa.  He denies hallucinations, lightheadedness, falls, syncope or dyskinesia.     Neuroimaging has previously been performed.  It is not available for my review today.  PREVIOUS MEDICATIONS: Sinemet (reported bad dreams/dizziness on regular formulation and same with Mirapex); requip;  on Sinemet CR currently, entacapone (felt dizzy and arms felt "heavy"); klonopin (made arms/legs hurt); artane  ALLERGIES:   Allergies  Allergen Reactions  . Plavix [Clopidogrel]     Fatigue   . Pravastatin   . Zocor [Simvastatin]     REACTION: dizziness  . Penicillins Rash    CURRENT MEDICATIONS:  Current Outpatient Prescriptions on File Prior to Visit  Medication Sig Dispense Refill  . amantadine (SYMMETREL) 100 MG capsule  Take 1 capsule (100 mg total) by mouth 3 (three) times daily.  270 capsule  3  . aspirin 325 MG tablet Take 325 mg by mouth daily.      . bisoprolol-hydrochlorothiazide (ZIAC) 10-6.25 MG per tablet Take 1 tablet by mouth daily.  90 tablet  3  . carbidopa-levodopa (SINEMET) 25-100 MG per tablet Take 2 tablets by mouth 4 (four) times daily.  720 tablet  1  . polyethylene glycol (MIRALAX / GLYCOLAX) packet Take 17 g by mouth daily.      . sildenafil (REVATIO) 20 MG tablet Take 3-5 tablets (60-100 mg total) by mouth daily as needed.  50 tablet  11  . vitamin B-12 (CYANOCOBALAMIN) 1000 MCG tablet Take 1,000 mcg by mouth daily.       No current facility-administered medications on file prior to visit.    PAST MEDICAL HISTORY:   Past Medical History  Diagnosis Date  . CAD (coronary artery disease) CABG x1 2001; PCI 2008    s/p LIMA-LAD in2001 with AVR; BMS to LCx 8/08  . Allergy   . Diverticulitis   . GERD (gastroesophageal reflux disease)   . Hypertension   . Osteoarthritis   . Osteoporosis   . MALT lymphoma   . Parkinson's disease   . ASCVD (arteriosclerotic cardiovascular disease) 06/08    TIA  . Hyperlipidemia   . ED (erectile dysfunction)   . Aortic valve stenosis, acquired 2001    s/p Porcine AVR; by 05/2009 Echo - EF 45-50%, moderate AS -- AVA 0.98 cm2, peak gradient 55mmHg  . Right renal artery stenosis 2012    90% stenosis - stable  . S/P cardiac pacemaker procedure, Medtronic Adapta L  ADDRL1, 01/21/12 01/22/2012    PAST SURGICAL HISTORY:   Past Surgical History  Procedure Laterality Date  . Coronary artery bypass graft  2001    Lucianne Lei trigt ) aortic valve replacement 2001; LIMA-LAD.  Marland Kitchen Esophagogastroduodenoscopy      multiple, Colon/ EGD benign 11/2004  . Pilonidal cyst / sinus excision  1953  . Inguinal hernia repair      LIH 1997Llap. bilateral hernias 1998  . Coronary stent placement  11/2007    8/09  Left Circumflex Stent by Dr Toy Care started and aggrenox  stopped  . Cataract extraction  06/09    left  . Laparoscopic cholecystectomy  07/11    Dr.Rosenbower  . Squamous cell carcinoma excision  3/13    back  . Aortic valve replacement  2001    Porcine valve  . Pacemaker insertion  01/21/2012    Medtronic  . Nm myocar perf wall motion  11/22/2007    mild septal ischemia,EF 66%    SOCIAL HISTORY:   History   Social History  . Marital Status: Married  Spouse Name: N/A    Number of Children: 3  . Years of Education: N/A   Occupational History  . retired- [employer]   . part-time sub/courier for schools- now only rarely    Social History Main Topics  . Smoking status: Former Smoker -- 1.00 packs/day for 5 years    Types: Cigarettes    Quit date: 06/06/1963  . Smokeless tobacco: Never Used  . Alcohol Use: No     Comment: very rare  . Drug Use: No  . Sexual Activity: No   Other Topics Concern  . Not on file   Social History Narrative   Has living will   Wife, then son [name], hold health care POA   Requests DNR --done   Requests no tube feeds if cognitively unaware    FAMILY HISTORY:   Family Status  Relation Status Death Age  . Mother Deceased 76    old age  . Father Deceased 46    acute bronchitis, cotton exposure  . Sister Deceased     trauma  . Brother Deceased     2, deceased  . Daughter Deceased 64    osteosarcoma  . Child Alive     3, healthy  . Brother Alive     healthy  . Sister Alive     healthy    ROS:    A complete 10 system review of systems was obtained and was unremarkable apart from what is mentioned above.  PHYSICAL EXAMINATION:    VITALS:   Filed Vitals:   08/13/13 1006  BP: 122/64  Pulse: 86  Resp: 16  Height: 5\' 8"  (1.727 m)  Weight: 147 lb (66.679 kg)    GEN:  The patient appears stated age and is in NAD. HEENT:  Normocephalic, atraumatic.  The mucous membranes are moist. The superficial temporal arteries are without ropiness or tenderness. CV:  RRR Lungs:   CTAB Neck/HEME:  There are no carotid bruits bilaterally.  Neurological examination:  Orientation: The patient is alert and oriented x3.  He is a very good historian today.  Movement examination: Tone: There is normal tone in the bilateral upper extremities today.  The tone in the lower extremities is normal.  Abnormal movements: There is a moderate RUE resting tremor and mild-mod LUE resting tremor.  No dyskinesia. Coordination:  There is miminal decremation with RAM's, seen mostly with hand opening and closing on the R Gait and Station: The patient has mild difficulty arising out of a deep-seated chair without the use of the hands. The patient's stride length is normal.  He has postural instability.    Lab Results  Component Value Date   WBC 8.5 01/07/2013   HGB 14.0 01/07/2013   HCT 41.2 01/07/2013   MCV 92.0 01/07/2013   PLT 250 01/07/2013     Chemistry      Component Value Date/Time   NA 137 01/07/2013 0653   K 3.6 01/07/2013 0653   CL 99 01/07/2013 0653   CO2 31 01/07/2013 0653   BUN 14 01/07/2013 0653   CREATININE 0.73 01/07/2013 0653      Component Value Date/Time   CALCIUM 9.2 01/07/2013 0653   ALKPHOS 60 03/29/2012 1138   AST 12 03/29/2012 1138   ALT 3 03/29/2012 1138   BILITOT 0.6 03/29/2012 1138     Lab Results  Component Value Date   VITAMINB12 660 03/06/2013   Lab Results  Component Value Date   TSH 2.10 03/29/2012  ASSESSMENT/PLAN:  1.  Idiopathic Parkinsons disease.    -We discussed the diagnosis as well as pathophysiology of the disease.  We discussed treatment options as well as prognostic indicators.  Patient education was provided.  -He was unable to take the IR form of levodopa previously and is currently on  25/100 CR, 8-10 tablets per day.  We are going to see if we can transition him to rytary.  I gave him samples of rytary 36.25/145 to try 3 caps tid.  He will call me and let me know how he does with that.  -We will continue amantadine 100 mg tid  as that has seemed to help some with the tremor. 2.  Mild cognitive impairment, likely from longstanding PD.  -He is really doing much better in this regard since being off of the artane.  3.  Constipation.  -This is very common in patients with Parkinson's disease. He has the rancho recipe. 4.  B12 deficiency.  -He is on oral supplements and his last level was 660 when rechecked. 5.  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  -He could not tolerate klonopin.

## 2013-08-17 ENCOUNTER — Telehealth: Payer: Self-pay | Admitting: Neurology

## 2013-08-17 NOTE — Telephone Encounter (Signed)
Could he try benadryl 25 mg at night?  He couldn't tolerate klonopin previously.  Do we need to go ahead and try to get [facility] appeal for the med now?

## 2013-08-17 NOTE — Telephone Encounter (Signed)
Spoke with patient and he states he is doing well on the Rytary. It is controlling his symptoms like the other medication, but the control is lasting longer. He is taking this at 7 am/ 2pm/ and 7 pm. His only complaint is night time insomnia. He states he will go to bed around 10 pm - it will take him awhile to go to sleep, then he will wake up around 12-1 and be up for awhile before he can get himself back to sleep. Please advise.

## 2013-08-17 NOTE — Telephone Encounter (Signed)
CB between 4-5pm regarding new med. [phone] / Gayleen Orem.

## 2013-08-20 MED ORDER — CARBIDOPA-LEVODOPA ER 36.25-145 MG PO CPCR: 3.0000 | ORAL_CAPSULE | Freq: Three times a day (TID) | ORAL | Status: DC

## 2013-08-20 NOTE — Telephone Encounter (Signed)
Pt called/returning Jade's call at 11:45AM.

## 2013-08-20 NOTE — Telephone Encounter (Signed)
Left message on machine for patient to call back.

## 2013-08-20 NOTE — Telephone Encounter (Signed)
Spoke with patient. He does not want to try benadryl yet. He would like to go ahead and try to get medication covered. Another sample bottle given to patient to cover him until we can get his medication approved. At front for patient pick up.

## 2013-08-21 ENCOUNTER — Telehealth: Payer: Self-pay | Admitting: Neurology

## 2013-08-21 NOTE — Telephone Encounter (Signed)
Pt wanted to le you know that medicare is primary and then has secondary ins also

## 2013-08-23 ENCOUNTER — Telehealth: Payer: Self-pay | Admitting: Neurology

## 2013-08-23 MED ORDER — CARBIDOPA-LEVODOPA ER 36.25-145 MG PO CPCR: 3.0000 | ORAL_CAPSULE | Freq: Three times a day (TID) | ORAL | Status: DC

## 2013-08-23 NOTE — Telephone Encounter (Signed)
Spoke with patient and made him aware Rytary was approved through insurance. He states if it is expensive he does not want to take it. Prescribed and patient will contact the pharmacy for cost and let me know if I need to switch him back to his other medication.

## 2013-08-23 NOTE — Telephone Encounter (Signed)
Pt called and said that he talked with you about a rx and he called [facility] and the rx is too much money please call him back he hung up before he gave me is phone number Hinton Dyer

## 2013-08-23 NOTE — Telephone Encounter (Signed)
Spoke with patient and he states co pay too much for Rytary - he will stay on Carbidopa Levodopa that he was previously taking and call us when he needs a refill.

## 2013-09-03 ENCOUNTER — Telehealth: Payer: Self-pay | Admitting: Cardiovascular Disease

## 2013-09-03 NOTE — Telephone Encounter (Signed)
Increasing edema of feet and ankles - even when he wakes in the AM - the edema never completely goes away -up for 2 hours yesterday and hardly able to get his loafers on for church.  Wife states he is eating salty foods but states he doesn't eat much food all day. Will review with Dr. Loletha Grayer and patient aware it may be tomorrow before I have an answer.

## 2013-09-03 NOTE — Telephone Encounter (Signed)
Pt need to see Dr Palma Holter is having excessive swelling in his feet and legs.

## 2013-09-04 NOTE — Telephone Encounter (Signed)
Please check CMET and have them come in for evaluation

## 2013-09-05 ENCOUNTER — Telehealth: Payer: Self-pay | Admitting: Cardiovascular Disease

## 2013-09-05 DIAGNOSIS — R609 Edema, unspecified: Secondary | ICD-10-CM | POA: Diagnosis not present

## 2013-09-05 DIAGNOSIS — Z79899 Other long term (current) drug therapy: Secondary | ICD-10-CM

## 2013-09-05 LAB — COMPREHENSIVE METABOLIC PANEL
ALK PHOS: 116 U/L (ref 39–117)
ALT: 10 U/L (ref 0–53)
AST: 15 U/L (ref 0–37)
Albumin: 4.2 g/dL (ref 3.5–5.2)
BILIRUBIN TOTAL: 0.8 mg/dL (ref 0.2–1.2)
BUN: 15 mg/dL (ref 6–23)
CO2: 31 mEq/L (ref 19–32)
CREATININE: 0.89 mg/dL (ref 0.50–1.35)
Calcium: 10.1 mg/dL (ref 8.4–10.5)
Chloride: 100 mEq/L (ref 96–112)
GLUCOSE: 91 mg/dL (ref 70–99)
Potassium: 4.4 mEq/L (ref 3.5–5.3)
Sodium: 139 mEq/L (ref 135–145)
Total Protein: 7.4 g/dL (ref 6.0–8.3)

## 2013-09-05 NOTE — Telephone Encounter (Signed)
RN returned call to patient's wife [name]. RN reviewed notation from Dr. Loletha Grayer regarding drawing a CMET and having patient come in for eval. RN informed wife of this and she is agreeable. CMET order placed and they will go to [facility] to have labs done this afternoon. Will send message to scheduling to set up appmt with Dr. Loletha Grayer or extender.   Wife reports that she thinks something may be going on with patient's kidneys - he has a slow, weak urine stream and discomfort.

## 2013-09-05 NOTE — Telephone Encounter (Signed)
She said the nurse was suppose to have called back yesterday. Pt feet and ankles are swollen so much, on his big toe there is white line on it.Pt needs to be seen asap.

## 2013-09-06 ENCOUNTER — Telehealth: Payer: Self-pay | Admitting: *Deleted

## 2013-09-06 MED ORDER — FUROSEMIDE 40 MG PO TABS: 40.0000 mg | ORAL_TABLET | Freq: Every day | ORAL | Status: DC

## 2013-09-06 MED ORDER — POTASSIUM CHLORIDE CRYS ER 20 MEQ PO TBCR: 20.0000 meq | EXTENDED_RELEASE_TABLET | Freq: Every day | ORAL | Status: DC

## 2013-09-06 NOTE — Telephone Encounter (Signed)
Patient notified of lab results and to start Furosemide 40mg  qd and K-Dur 14meq qd.  Sent to [facility].  Patient voiced understanding.  Will keep daily log of weight and bring with him 09/11/13 for visit.

## 2013-09-06 NOTE — Telephone Encounter (Signed)
Message copied by Tressa Busman on Thu Sep 06, 2013  1:25 PM ------      Message from: Sanda Klein      Created: Thu Sep 06, 2013  8:20 AM       Labs are normal. Please start furosemide 40 mg daily and KCl 20 mEq daily, keep appt. Daily weights and bring record please. ------

## 2013-09-06 NOTE — Telephone Encounter (Signed)
Closed encounter °

## 2013-09-11 ENCOUNTER — Encounter: Payer: Self-pay | Admitting: Cardiovascular Disease

## 2013-09-11 ENCOUNTER — Ambulatory Visit (INDEPENDENT_AMBULATORY_CARE_PROVIDER_SITE_OTHER): Payer: Medicare Other | Admitting: Cardiovascular Disease

## 2013-09-11 VITALS — BP 114/70 | HR 80 | Resp 16 | Ht 68.0 in | Wt 144.4 lb

## 2013-09-11 DIAGNOSIS — I472 Ventricular tachycardia: Secondary | ICD-10-CM | POA: Diagnosis not present

## 2013-09-11 DIAGNOSIS — I4729 Other ventricular tachycardia: Secondary | ICD-10-CM | POA: Diagnosis not present

## 2013-09-11 DIAGNOSIS — I2510 Atherosclerotic heart disease of native coronary artery without angina pectoris: Secondary | ICD-10-CM

## 2013-09-11 DIAGNOSIS — I441 Atrioventricular block, second degree: Secondary | ICD-10-CM | POA: Diagnosis not present

## 2013-09-11 DIAGNOSIS — I701 Atherosclerosis of renal artery: Secondary | ICD-10-CM

## 2013-09-11 LAB — PACEMAKER DEVICE OBSERVATION

## 2013-09-11 MED ORDER — POTASSIUM CHLORIDE ER 10 MEQ PO TBCR: 10.0000 meq | EXTENDED_RELEASE_TABLET | Freq: Two times a day (BID) | ORAL | Status: DC

## 2013-09-11 MED ORDER — FUROSEMIDE 40 MG PO TABS: 40.0000 mg | ORAL_TABLET | Freq: Every day | ORAL | Status: DC

## 2013-09-11 MED ORDER — BISOPROLOL-HYDROCHLOROTHIAZIDE 5-6.25 MG PO TABS: 1.0000 | ORAL_TABLET | Freq: Every day | ORAL | Status: DC

## 2013-09-11 NOTE — Patient Instructions (Signed)
A new prescription for Furosemide 40mg  daily.  Potassium has been changed to 96meq twice a day.  New Rx has been sent to your pharmacy.  Change Ziac to 5/6.25mg  daily - this has been sent to your pharmacy.  Your physician has requested that you have a renal artery duplex. During this test, an ultrasound is used to evaluate blood flow to the kidneys. Allow one hour for this exam. Do not eat after midnight the day before and avoid carbonated beverages. Take your medications as you usually do.  Your physician recommends that you schedule a follow-up appointment in: one month with Dr. Gwenlyn Found.

## 2013-09-12 NOTE — Progress Notes (Signed)
Patient ID: Ian [name], male   DOB: Mar 02, 1929, 80 y.o.   MRN: [id number]     Reason for office visit Worsening exertional dyspnea  Ian [name] now has shortness of breath with almost any activity. He is often dizzy. He had severe swelling in his feet his ankles to the point that he was unable to wear his shoes or feel his toes. After treatment with diuretics which we initiated after telephonic contact a few days ago. His edema has improved and his dyspnea also seems to be getting better.  Throughout all of the winter months his weight seemed to be stable between 100 5658 pounds. In April he weighed around 147 pounds. Last week his weight had increased to 251 pounds and after treatment with diuretics it is now down 244 pounds. He still has substantial pedal edema. His appetite is poor and his oral intake is poor.  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 The most recent study performed in November 2014 showed a mean gradient of 18 mm Hg and a calculated valve area of around 1.3 cm square. LVEF was 50-55%. He has an approximately 90% stenosis of the right renal artery that has been stable in severity for years. It was last evaluated in January of 2014 He has virtually 100% ventricular pacing, although he does have an underlying junctional escape rhythm at about 52 beats per minutes.  Repeat interrogation of his pacemaker today shows similar findings to previous checks. He has almost no atrial pacing but has 100% ventricular pacing. Atrial and ventricular lead parameters are all excellent. Battery longevity is in excess of 10  years.    Allergies  Allergen Reactions  . Plavix [Clopidogrel]     Fatigue   . Pravastatin   . Zocor [Simvastatin]     REACTION: dizziness  . Penicillins Rash    Current Outpatient Prescriptions  Medication Sig Dispense Refill  . amantadine (SYMMETREL) 100 MG capsule Take 1 capsule (100 mg total) by mouth 3 (three) times daily.  270 capsule  3  . aspirin 325 MG tablet Take 325 mg by mouth daily.      . carbidopa-levodopa (SINEMET IR) 25-100 MG per tablet Take 2 tablets by mouth 4 (four) times daily.      . furosemide (LASIX) 40 MG tablet Take 1 tablet (40 mg total) by mouth daily.  90 tablet  3  . polyethylene glycol (MIRALAX / GLYCOLAX) packet Take 17 g by mouth daily.      . vitamin B-12 (CYANOCOBALAMIN) 1000 MCG tablet Take 1,000 mcg by mouth daily.      . bisoprolol-hydrochlorothiazide (ZIAC) 5-6.25 MG per tablet Take 1 tablet by mouth daily.  90 tablet  3  . potassium chloride (K-DUR) 10 MEQ tablet Take 1 tablet (10 mEq total) by mouth 2 (two) times daily.  180 tablet  3   No current facility-administered medications for this visit.    Past Medical History  Diagnosis Date  . CAD (coronary artery disease) CABG x1 2001; PCI 2008    s/p LIMA-LAD in2001 with AVR; BMS to LCx 8/08  . Allergy   . Diverticulitis   . GERD (gastroesophageal reflux disease)   . Hypertension   .  Osteoarthritis   . Osteoporosis   . MALT lymphoma   . Parkinson's disease   . ASCVD (arteriosclerotic cardiovascular disease) 06/08    TIA  . Hyperlipidemia   . ED (erectile dysfunction)   . Aortic valve stenosis, acquired 2001    s/p Porcine AVR; by 05/2009 Echo - EF 45-50%, moderate AS -- AVA 0.98 cm2, peak gradient 6mmHg  . Right renal artery stenosis 2012    90% stenosis - stable  . S/P cardiac pacemaker procedure, Medtronic Adapta L  ADDRL1, 01/21/12 01/22/2012    Past Surgical History  Procedure Laterality Date  . Coronary artery bypass graft  2001    Lucianne Lei trigt ) aortic valve replacement  2001; LIMA-LAD.  Marland Kitchen Esophagogastroduodenoscopy      multiple, Colon/ EGD benign 11/2004  . Pilonidal cyst / sinus excision  1953  . Inguinal hernia repair      LIH 1997Llap. bilateral hernias 1998  . Coronary stent placement  11/2007    8/09  Left Circumflex Stent by Dr Toy Care started and aggrenox stopped  . Cataract extraction  06/09    left  . Laparoscopic cholecystectomy  07/11    Dr.Rosenbower  . Squamous cell carcinoma excision  3/13    back  . Aortic valve replacement  2001    Porcine valve  . Pacemaker insertion  01/21/2012    Medtronic  . Nm myocar perf wall motion  11/22/2007    mild septal ischemia,EF 66%    Family History  Problem Relation Age of Onset  . Heart disease Brother   . Asthma Father     History   Social History  . Marital Status: Married    Spouse Name: N/A    Number of Children: 3  . Years of Education: N/A   Occupational History  . retired- [employer]   . part-time sub/courier for schools- now only rarely    Social History Main Topics  . Smoking status: Former Smoker -- 1.00 packs/day for 5 years    Types: Cigarettes    Quit date: 06/06/1963  . Smokeless tobacco: Never Used  . Alcohol Use: No     Comment: very rare  . Drug Use: No  . Sexual Activity: No   Other Topics Concern  . Not on file   Social History Narrative   Has living will   Wife, then son [name], hold health care POA   Requests DNR --done   Requests no tube feeds if cognitively unaware    Review of systems: The patient specifically denies any chest pain at rest or with exertion, dyspnea at rest, orthopnea, paroxysmal nocturnal dyspnea, syncope, palpitations, intermittent claudication, lower extremity edema, unexplained weight gain, cough, hemoptysis or wheezing.  The patient also denies abdominal pain, nausea, vomiting, dysphagia, diarrhea, constipation, polyuria, polydipsia, dysuria, hematuria, frequency, urgency, abnormal bleeding or bruising, fever,  chills, unexpected weight changes, mood swings, change in skin or hair texture, change in voice quality, auditory or visual problems, allergic reactions or rashes, new musculoskeletal complaints other than usual "aches and pains".   PHYSICAL EXAM BP 114/70  Pulse 80  Ht 5\' 8"  (1.727 m)  Wt 144 lb 6.4 oz (65.499 kg)  BMI 21.96 kg/m2 General: Alert, oriented x3, no distress  Head: no evidence of trauma, PERRL, EOMI, no exophtalmos or lid lag, no myxedema, no xanthelasma; normal ears, nose and oropharynx  Neck: normal jugular venous pulsations and no hepatojugular reflux; brisk carotid pulses without delay and no carotid bruits  Chest: clear to auscultation, no signs of consolidation by percussion or palpation, normal fremitus, symmetrical and full respiratory excursions; LV sternotomy scar and subclavian pacemaker site  Cardiovascular: normal position and quality of the apical impulse, regular rhythm, normal first and second heart sounds, no murmurs, rubs or gallops  Abdomen: no tenderness or distention, no masses by palpation, no abnormal pulsatility or arterial bruits, normal bowel sounds, no hepatosplenomegaly  Extremities: no clubbing, cyanosis; symmetrical 2+ pitting bilateral ankleedema; 2+ radial, ulnar and brachial pulses bilaterally; 2+ right femoral, posterior tibial and dorsalis pedis pulses; 2+ left femoral, posterior tibial and dorsalis pedis pulses; no subclavian or femoral bruits  Neurological: Resting tremor is very prominent in the right hand but also noticed on the other side. Tremor persists even with activity although it improves.    EKG: Atrial sensed ventricular paced   Lipid Panel     Component Value Date/Time   CHOL 163 03/29/2012 1138   TRIG 75.0 03/29/2012 1138   HDL 54.80 03/29/2012 1138   CHOLHDL 3 03/29/2012 1138   VLDL 15.0 03/29/2012 1138   LDLCALC 93 03/29/2012 1138    BMET    Component Value Date/Time   NA 139 09/05/2013 1329   K 4.4 09/05/2013 1329    CL 100 09/05/2013 1329   CO2 31 09/05/2013 1329   GLUCOSE 91 09/05/2013 1329   BUN 15 09/05/2013 1329   CREATININE 0.89 09/05/2013 1329   CREATININE 0.73 01/07/2013 0653   CALCIUM 10.1 09/05/2013 1329   GFRNONAA 85* 01/07/2013 0653   GFRAA >90 01/07/2013 0653     ASSESSMENT AND PLAN  Ian [name] has physical findings and complaints consistent with acute on chronic biventricular heart failure. Despite losing roughly 7 pounds of fluid he is still clearly hypervolemic. He has trouble swallowing the large potassium chloride supplement tablets and requests that we give him a smaller dosage. He continues to have dizziness and lightheadedness and we will have to decrease his beta blocker in order to allow more diuretic.  His wife is concerned that his recent fluid accumulation may be a sign of worsening renal artery stenosis. It is not unreasonable to reevaluate this since it has been almost a year and a half since we last checked. He had a fairly recent echocardiogram that showed mild prosthetic aortic valve stenosis - it is very doubtful that it would have progressed this quickly. He denies angina.  I think he clearly has lost a lot of true body weight and need to reestablish his "dry weight". I suspect he is still harboring 5-10 pounds of excess fluid. He is due to followup with Dr. Alvester Chou in the near future, will schedule a renal duplex ultrasound between now and then.  Patient Instructions  A new prescription for Furosemide 40mg  daily.  Potassium has been changed to 23meq twice a day.  New Rx has been sent to your pharmacy.  Change Ziac to 5/6.25mg  daily - this has been sent to your pharmacy.  Your physician has requested that you have a renal artery duplex. During this test, an ultrasound is used to evaluate blood flow to the kidneys. Allow one hour for this exam. Do not eat after midnight the day before and avoid carbonated beverages. Take your medications as you usually do.  Your physician  recommends that you schedule a follow-up appointment in: one month with Dr. Gwenlyn Found.     Orders Placed This Encounter  Procedures  . Renal Artery Duplex Bilateral   Meds ordered this encounter  Medications  . carbidopa-levodopa (SINEMET IR) 25-100 MG per tablet    Sig: Take 2 tablets by mouth 4 (four) times daily.  . furosemide (LASIX) 40 MG tablet    Sig: Take 1 tablet (40 mg total) by mouth daily.    Dispense:  90 tablet    Refill:  3  . potassium chloride (K-DUR) 10 MEQ tablet    Sig: Take 1 tablet (10 mEq total) by mouth 2 (two) times daily.    Dispense:  180 tablet    Refill:  3  . bisoprolol-hydrochlorothiazide (ZIAC) 5-6.25 MG per tablet    Sig: Take 1 tablet by mouth daily.    Dispense:  90 tablet    Refill:  3    Enora Trillo  Sanda Klein, MD, [facility] [phone] office [phone] pager

## 2013-09-13 LAB — MDC_IDC_ENUM_SESS_TYPE_INCLINIC
Battery Impedance: 112 Ohm
Brady Statistic AP VP Percent: 66.4 %
Brady Statistic AS VS Percent: 0.1 %
Lead Channel Impedance Value: 505 Ohm
Lead Channel Impedance Value: 595 Ohm
Lead Channel Pacing Threshold Amplitude: 0.75 V
Lead Channel Pacing Threshold Pulse Width: 0.4 ms
Lead Channel Pacing Threshold Pulse Width: 0.4 ms
Lead Channel Sensing Intrinsic Amplitude: 8 mV
Lead Channel Setting Pacing Amplitude: 2.5 V
Lead Channel Setting Sensing Sensitivity: 2.8 mV
MDC IDC MSMT BATTERY VOLTAGE: 2.79 V
MDC IDC MSMT LEADCHNL RA SENSING INTR AMPL: 2.8 mV
MDC IDC MSMT LEADCHNL RV PACING THRESHOLD AMPLITUDE: 1 V
MDC IDC SET LEADCHNL RA PACING AMPLITUDE: 2 V
MDC IDC SET LEADCHNL RV PACING PULSEWIDTH: 0.4 ms
MDC IDC STAT BRADY AP VS PERCENT: 0.3 %
MDC IDC STAT BRADY AS VP PERCENT: 33.2 %

## 2013-09-24 ENCOUNTER — Telehealth: Payer: Self-pay | Admitting: Cardiovascular Disease

## 2013-09-24 MED ORDER — FUROSEMIDE 40 MG PO TABS: 40.0000 mg | ORAL_TABLET | Freq: Every day | ORAL | Status: DC

## 2013-09-24 NOTE — Telephone Encounter (Signed)
Please refill.

## 2013-09-24 NOTE — Telephone Encounter (Signed)
Is needing new prescription sent to [facility] Furosemide 40mg  .. Please Call if you have any questions .Marland Kitchen Have been taking medication faithfully but the edema is still there.  Thanks

## 2013-09-24 NOTE — Telephone Encounter (Signed)
RN SPOKE TO WIFE. CONTINUE TAKING LASIX. PRESCRIPTION E-SENT. WIFE AWARE.

## 2013-09-24 NOTE — Telephone Encounter (Signed)
Spoke with wife she states patient needs a prescription for LASIX 40 mg . Wife states patient has been using lasix daily . Edema is still present . RN ask about weight .  WEIGHT at the beginning 145.7 lbs  -daily weight range 138 to 139. Wife states prescription was not sent to [facility]  Appointment with Dr Gwenlyn Found IN 10/2013 after renal doppler in 09/2013 Will defer Dr Sallyanne Kuster --does he still need prescription

## 2013-09-24 NOTE — Telephone Encounter (Signed)
Returned Call 

## 2013-09-26 ENCOUNTER — Ambulatory Visit (HOSPITAL_COMMUNITY)
Admission: RE | Admit: 2013-09-26 | Discharge: 2013-09-26 | Disposition: A | Discharge status: Home / Self Care | Payer: Medicare Other | Source: Ambulatory Visit | Attending: Cardiovascular Disease | Admitting: Cardiovascular Disease

## 2013-09-26 DIAGNOSIS — I10 Essential (primary) hypertension: Secondary | ICD-10-CM

## 2013-09-26 DIAGNOSIS — I701 Atherosclerosis of renal artery: Secondary | ICD-10-CM | POA: Diagnosis not present

## 2013-09-26 NOTE — Progress Notes (Signed)
Renal Duplex Completed. Ian [name], BS, RDMS, RVT  

## 2013-09-28 ENCOUNTER — Telehealth: Payer: Self-pay | Admitting: *Deleted

## 2013-09-28 DIAGNOSIS — I701 Atherosclerosis of renal artery: Secondary | ICD-10-CM

## 2013-09-28 NOTE — Telephone Encounter (Signed)
Renal doppler results called to patient.  Voiced understanding.  Order placed for repeat in 12 months.

## 2013-09-28 NOTE — Telephone Encounter (Signed)
Message copied by Tressa Busman on Fri Sep 28, 2013  2:55 PM ------      Message from: Sanda Klein      Created: Fri Sep 28, 2013  9:17 AM       Renal artery scan hasn't really changed from a year ago. There is a significant stenosis on the right, but this is not likely to be responsible for his edema. Please reschedule for renal duplex scan in 12 months ------

## 2013-10-01 ENCOUNTER — Encounter: Payer: Self-pay | Admitting: Neurology

## 2013-10-01 ENCOUNTER — Ambulatory Visit (INDEPENDENT_AMBULATORY_CARE_PROVIDER_SITE_OTHER): Payer: Medicare Other | Admitting: Neurology

## 2013-10-01 VITALS — BP 102/62 | HR 84 | Resp 16 | Ht 68.0 in | Wt 141.0 lb

## 2013-10-01 DIAGNOSIS — I2510 Atherosclerotic heart disease of native coronary artery without angina pectoris: Secondary | ICD-10-CM | POA: Diagnosis not present

## 2013-10-01 DIAGNOSIS — E538 Deficiency of other specified B group vitamins: Secondary | ICD-10-CM | POA: Diagnosis not present

## 2013-10-01 DIAGNOSIS — R609 Edema, unspecified: Secondary | ICD-10-CM

## 2013-10-01 DIAGNOSIS — G3184 Mild cognitive impairment, so stated: Secondary | ICD-10-CM | POA: Diagnosis not present

## 2013-10-01 DIAGNOSIS — G20 Parkinson's disease: Secondary | ICD-10-CM | POA: Diagnosis not present

## 2013-10-01 MED ORDER — CARBIDOPA-LEVODOPA ER 25-100 MG PO TBCR: 2.0000 | EXTENDED_RELEASE_TABLET | Freq: Every day | ORAL | Status: DC

## 2013-10-01 NOTE — Progress Notes (Signed)
Ian [name] was seen today in the movement disorders clinic for f/u.  He is accompanied by his wife who supplements the history.  The first symptom(s) the patient noticed was tremor in the R hand when he would use the hand or have a cup of coffee in it.  His wife believes that this began in 2004.  He was referred to Dr. Tilden Dome.  He tried some medication and according to his wife, he kept having adverse SE.  He was on mirapex and sinemet but reported dizziness and bad dreams with both.   He was referred to Dr. Yevonne Pax in about 2010 because he was interested in DBS but the patient was concerned about risks of the surgery and potential side effects.    09/28/12  I tried to change the levodopa back to the IR formulation but he did not like it because he was too drowsy and went back to the CR formulation.  , 2 po qid and carbidopa/levodopa 50/200 at night.    He has continued to have more tremor after the artane was d/c.  However, this has become less bothersome for him over time.  He does notice that sometimes the medication wears off quickly and sometimes it does not.  It seems inconsistent.  03/06/13 update:  The patient is accompanied by his wife who supplements the history.  Last visit, we tried to add entacapone to each of the 4 carbidopa/levodopa dosing times.  He called me because of nausea and we decreased it to twice a day dosing.  He continued to have nausea and the medication was discontinued.  The patient states that the medication makes his arms feel "heavy." He has stopped the carbidopa/levodopa 50/200 at night as he does not think it was beneficial.  Sometimes, he will wake up in the middle of the night and take an extra of the carbidopa/levodopa CR 25 100.  He and his wife have noted some jerking spells at night.  He sleeps very restless. I did review notes in his chart since last visit.  He went to the emergency room on September 21 with a sensation of palpitations and tachycardia.  His  pacemaker was interrogated and there were no problems and he was subsequently sent home.  06/11/13 update:  This patient is accompanied in the office by his spouse who supplements the history.  Pt is on carbidopa/levodopa 25/100 CR - 2 in the AM and 6 other dosages throughout the day.  When he awakens in the middle of the night he may take a few extra dosages and he estimates he takes a total daily levodopa dose of 1000-1200 mg per day.  He has been very senstive to medication and has not been able to tolerate the immediate release carbidopa/levodopa, entacapone or klonopin (made arms/legs hurt).  Tremor feels like it is getting worse.  Vision is getting worse but he went to the eye doctor and his vision was stable.  He feels like the levodopa only lasts 1.5-2 hrs.  No falls.  Is exercising.    08/13/13 update:   This patient is accompanied in the office by his spouse who supplements the history.  Pt is on carbidopa/levodopa 25/100, and takes approximately 8-10 tablets per day.  Last visit, I also added amantadine to see if that would help the tremor. He does think that has helped.  He presents today to try and change to rytary to see if it would last longer.  He does not wish to  try duopa.  He denies hallucinations, lightheadedness, falls, syncope or dyskinesia.     10/01/13 update:  Last visit, I changed the patient to rytary.  It turned out to be very expensive as the patient had no RX coverage, which I did not realize.  When he changed back, the IR formulation was accidentally called in instead of the CR prepartation (and he didn't tolerate the IR in the past).  The patient also didn't notice that and he isn't even sure now what he is taking but knows he is taking 2 po qid of either the CR or IR.  He is having hallucinations.  He is having swelling in the feet and legs as well.   They saw cardiology last week and were started on lasix and they didn't think that it made a difference.  They are worried  about a discoloration in legs.  He has lost weight.  Had one fall on May 10.  Walking into bedroom, got lightheaded and hit his back on the dresser.  He didn't think that he hit his head at the time but now that they think that he did as he has had hallucinations since.  However, looking back at our notes, it was 5/7 when he called here to d/c the rytary and changed to the IR levodopa it was 5/10 when he fell so timing was likely just coincidental as he had likely just changed back to the IR preparation.  Neuroimaging has previously been performed.  It is not available for my review today.  PREVIOUS MEDICATIONS: Sinemet (reported bad dreams/dizziness on regular formulation and same with Mirapex); requip;  on Sinemet CR currently, entacapone (felt dizzy and arms felt "heavy"); klonopin (made arms/legs hurt); artane  ALLERGIES:   Allergies  Allergen Reactions  . Plavix [Clopidogrel]     Fatigue   . Pravastatin   . Zocor [Simvastatin]     REACTION: dizziness  . Penicillins Rash    CURRENT MEDICATIONS:  Current Outpatient Prescriptions on File Prior to Visit  Medication Sig Dispense Refill  . amantadine (SYMMETREL) 100 MG capsule Take 1 capsule (100 mg total) by mouth 3 (three) times daily.  270 capsule  3  . aspirin 325 MG tablet Take 325 mg by mouth daily.      . bisoprolol-hydrochlorothiazide (ZIAC) 5-6.25 MG per tablet Take 1 tablet by mouth daily.  90 tablet  3  . carbidopa-levodopa (SINEMET IR) 25-100 MG per tablet Take 2 tablets by mouth 4 (four) times daily.      . furosemide (LASIX) 40 MG tablet Take 1 tablet (40 mg total) by mouth daily.  90 tablet  3  . polyethylene glycol (MIRALAX / GLYCOLAX) packet Take 17 g by mouth daily.      . potassium chloride (K-DUR) 10 MEQ tablet Take 1 tablet (10 mEq total) by mouth 2 (two) times daily.  180 tablet  3  . vitamin B-12 (CYANOCOBALAMIN) 1000 MCG tablet Take 1,000 mcg by mouth daily.       No current facility-administered medications on  file prior to visit.    PAST MEDICAL HISTORY:   Past Medical History  Diagnosis Date  . CAD (coronary artery disease) CABG x1 2001; PCI 2008    s/p LIMA-LAD in2001 with AVR; BMS to LCx 8/08  . Allergy   . Diverticulitis   . GERD (gastroesophageal reflux disease)   . Hypertension   . Osteoarthritis   . Osteoporosis   . MALT lymphoma   . Parkinson's disease   .  ASCVD (arteriosclerotic cardiovascular disease) 06/08    TIA  . Hyperlipidemia   . ED (erectile dysfunction)   . Aortic valve stenosis, acquired 2001    s/p Porcine AVR; by 05/2009 Echo - EF 45-50%, moderate AS -- AVA 0.98 cm2, peak gradient 70mmHg  . Right renal artery stenosis 2012    90% stenosis - stable  . S/P cardiac pacemaker procedure, Medtronic Adapta L  ADDRL1, 01/21/12 01/22/2012    PAST SURGICAL HISTORY:   Past Surgical History  Procedure Laterality Date  . Coronary artery bypass graft  2001    Lucianne Lei trigt ) aortic valve replacement 2001; LIMA-LAD.  Marland Kitchen Esophagogastroduodenoscopy      multiple, Colon/ EGD benign 11/2004  . Pilonidal cyst / sinus excision  1953  . Inguinal hernia repair      LIH 1997Llap. bilateral hernias 1998  . Coronary stent placement  11/2007    8/09  Left Circumflex Stent by Dr Toy Care started and aggrenox stopped  . Cataract extraction  06/09    left  . Laparoscopic cholecystectomy  07/11    Dr.Rosenbower  . Squamous cell carcinoma excision  3/13    back  . Aortic valve replacement  2001    Porcine valve  . Pacemaker insertion  01/21/2012    Medtronic  . Nm myocar perf wall motion  11/22/2007    mild septal ischemia,EF 66%    SOCIAL HISTORY:   History   Social History  . Marital Status: Married    Spouse Name: N/A    Number of Children: 3  . Years of Education: N/A   Occupational History  . retired- [employer]   . part-time sub/courier for schools- now only rarely    Social History Main Topics  . Smoking status: Former Smoker -- 1.00 packs/day for 5 years     Types: Cigarettes    Quit date: 06/06/1963  . Smokeless tobacco: Never Used  . Alcohol Use: No     Comment: very rare  . Drug Use: No  . Sexual Activity: No   Other Topics Concern  . Not on file   Social History Narrative   Has living will   Wife, then son [name], hold health care POA   Requests DNR --done   Requests no tube feeds if cognitively unaware    FAMILY HISTORY:   Family Status  Relation Status Death Age  . Mother Deceased 40    old age  . Father Deceased 65    acute bronchitis, cotton exposure  . Sister Deceased     trauma  . Brother Deceased     2, deceased  . Daughter Deceased 103    osteosarcoma  . Child Alive     3, healthy  . Brother Alive     healthy  . Sister Alive     healthy    ROS:    A complete 10 system review of systems was obtained and was unremarkable apart from what is mentioned above.  PHYSICAL EXAMINATION:    VITALS:   Filed Vitals:   10/01/13 1340  BP: 102/62  Pulse: 84  Resp: 16  Height: 5\' 8"  (1.727 m)  Weight: 141 lb (63.957 kg)    GEN:  The patient appears stated age and is in NAD. HEENT:  Normocephalic, atraumatic.  The mucous membranes are moist. The superficial temporal arteries are without ropiness or tenderness. CV:  RRR Lungs:  CTAB Neck/HEME:  There are no carotid bruits bilaterally.  Neurological  examination:  Orientation: The patient is alert and oriented x3.  He is a very good historian today.  Movement examination: Tone: There is normal tone in the bilateral upper extremities today.  The tone in the lower extremities is normal.  Abnormal movements: There is a moderate RUE resting tremor and mild-mod LUE resting tremor.  No dyskinesia. Coordination:  There is miminal decremation with RAM's, seen mostly with hand opening and closing on the R Gait and Station: The patient has mild difficulty arising out of a deep-seated chair without the use of the hands. The patient's stride length is normal.  He has  postural instability.    Lab Results  Component Value Date   WBC 8.5 01/07/2013   HGB 14.0 01/07/2013   HCT 41.2 01/07/2013   MCV 92.0 01/07/2013   PLT 250 01/07/2013     Chemistry      Component Value Date/Time   NA 139 09/05/2013 1329   K 4.4 09/05/2013 1329   CL 100 09/05/2013 1329   CO2 31 09/05/2013 1329   BUN 15 09/05/2013 1329   CREATININE 0.89 09/05/2013 1329   CREATININE 0.73 01/07/2013 0653      Component Value Date/Time   CALCIUM 10.1 09/05/2013 1329   ALKPHOS 116 09/05/2013 1329   AST 15 09/05/2013 1329   ALT 10 09/05/2013 1329   BILITOT 0.8 09/05/2013 1329     Lab Results  Component Value Date   VITAMINB12 660 03/06/2013   Lab Results  Component Value Date   TSH 2.10 03/29/2012     ASSESSMENT/PLAN:  1.  Idiopathic Parkinsons disease.    -We discussed the diagnosis as well as pathophysiology of the disease.  We discussed treatment options as well as prognostic indicators.  Patient education was provided.  -He will discontinue the carbidopa/levodopa IR that was accidentally given and change back to the 25/100 CR and take 2 tablets by mouth 4 times per day.  Hopefully hallucinations will go away  -Although amantadine is helping with the tremor, it may be causing some of the swelling and will be discontinued.  I did not see livedo reticularis, but they are describing some of this. 2.  Mild cognitive impairment, likely from longstanding PD.  -He is really doing much better in this regard since being off of the artane.  3.  Constipation.  -This is very common in patients with Parkinson's disease. He has the rancho recipe. 4.  B12 deficiency.  -He is on oral supplements and his last level was 660 when rechecked. 5.  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  -He could not tolerate klonopin. 6.  weight loss.  -While this could be from tremor, it sounds like appetite has been good.  I encouraged Ensure, but not around the time that he takes levodopa.  I also asked him to followup with his primary  care physician. 7.  I will see him back next month, sooner should new neurologic issues arise.

## 2013-10-01 NOTE — Patient Instructions (Signed)
1. Start Carbidopa Levodopa CR 25/100 take 8-10 tablets daily. This medication has been sent to your pharmacy.  2. Stop Amantadine.  3. Follow up 6 weeks.

## 2013-10-02 ENCOUNTER — Telehealth: Payer: Self-pay | Admitting: Neurology

## 2013-10-02 NOTE — Telephone Encounter (Signed)
Pt wife [name] said that she really needed to talk to you this morning [phone]

## 2013-10-02 NOTE — Telephone Encounter (Signed)
Spoke with patient's wife. He does not have any CR tablets of the Levodopa. I offered to call in enough to last for a week at a local pharmacy or per Dr Tat patient can cut back to 1 tablet 4 times daily of the IR Levodopa. They would like to use the IR until the RX arrives. They will let me know if they change their minds.

## 2013-10-09 ENCOUNTER — Ambulatory Visit (INDEPENDENT_AMBULATORY_CARE_PROVIDER_SITE_OTHER): Payer: Medicare Other | Admitting: Internal Medicine

## 2013-10-09 ENCOUNTER — Encounter: Payer: Self-pay | Admitting: Internal Medicine

## 2013-10-09 VITALS — BP 120/70 | HR 87 | Temp 97.8°F | Wt 143.0 lb

## 2013-10-09 DIAGNOSIS — R609 Edema, unspecified: Secondary | ICD-10-CM | POA: Diagnosis not present

## 2013-10-09 DIAGNOSIS — I2510 Atherosclerotic heart disease of native coronary artery without angina pectoris: Secondary | ICD-10-CM | POA: Diagnosis not present

## 2013-10-09 DIAGNOSIS — G20 Parkinson's disease: Secondary | ICD-10-CM | POA: Diagnosis not present

## 2013-10-09 DIAGNOSIS — G20A1 Parkinson's disease without dyskinesia, without mention of fluctuations: Secondary | ICD-10-CM

## 2013-10-09 NOTE — Assessment & Plan Note (Signed)
Has noticed since fall last month Ongoing back pain--but seems to just have some lateral lumbar muscle tenderness (nothing that could cause obstruction) No inguinal nodes and no HSM Weight is mildly down--but eating okay  No evidence of CHF Recent renal function normal Liver tests normal Seems most likely to be just venous insufficiency Minimal response to lasix-but albumin level normal and clearly not lymphedema Urine screen is slow-- but doesn't seem to be obstructed (based on bladder percussion). If ongoing problems, urology would be appropriate where a bladder scan could be done  Reassured that this doesn't seem to be a serious process Continue the furosemide Try support socks

## 2013-10-09 NOTE — Progress Notes (Signed)
Pre visit review using our clinic review tool, if applicable. No additional management support is needed unless otherwise documented below in the visit note. 

## 2013-10-09 NOTE — Progress Notes (Signed)
Subjective:    Patient ID: Ian [name], male    DOB: 12-07-1928, 80 y.o.   MRN: [id number]  HPI Here with wife  [place] on May 10th--didn't seem too bad. Bruise on back and scrape on shoulder Hit head but no apparent injury there Since then, he has not been "very well" Leg swelling started about a week after Not much improved despite the furosemide Began to lose weight then also---even before starting the furosemide Legs swollen in AM--may worsen some as the day goes on  No problems with the renal artery stenosis or pacer per Dr C Dr Tat had them cut back on the amantadine---stopped and no improvement in the swelling Increased unsteadiness, fatigue and hallucinations--- they think it may be related to mistaken Rx for IR carbidopa/levodopa  Poor urine stream  No obvious problem with breathing Does have limited exercise tolerance--needs rest even after folding clothes for a while (but no change) No chest pain Some dizziness--relates to some of his meds  Current Outpatient Prescriptions on File Prior to Visit  Medication Sig Dispense Refill  . amantadine (SYMMETREL) 100 MG capsule Take 1 capsule (100 mg total) by mouth 3 (three) times daily.  270 capsule  3  . aspirin 325 MG tablet Take 325 mg by mouth daily.      . bisoprolol-hydrochlorothiazide (ZIAC) 5-6.25 MG per tablet Take 1 tablet by mouth daily.  90 tablet  3  . Carbidopa-Levodopa ER (SINEMET CR) 25-100 MG tablet controlled release Take 2 tablets by mouth 5 (five) times daily.  900 tablet  3  . furosemide (LASIX) 40 MG tablet Take 1 tablet (40 mg total) by mouth daily.  90 tablet  3  . polyethylene glycol (MIRALAX / GLYCOLAX) packet Take 17 g by mouth daily.      . potassium chloride (K-DUR) 10 MEQ tablet Take 1 tablet (10 mEq total) by mouth 2 (two) times daily.  180 tablet  3  . vitamin B-12 (CYANOCOBALAMIN) 1000 MCG tablet Take 1,000 mcg by mouth daily.       No current facility-administered medications on file prior  to visit.    Allergies  Allergen Reactions  . Plavix [Clopidogrel]     Fatigue   . Pravastatin   . Zocor [Simvastatin]     REACTION: dizziness  . Penicillins Rash    Past Medical History  Diagnosis Date  . CAD (coronary artery disease) CABG x1 2001; PCI 2008    s/p LIMA-LAD in2001 with AVR; BMS to LCx 8/08  . Allergy   . Diverticulitis   . GERD (gastroesophageal reflux disease)   . Hypertension   . Osteoarthritis   . Osteoporosis   . MALT lymphoma   . Parkinson's disease   . ASCVD (arteriosclerotic cardiovascular disease) 06/08    TIA  . Hyperlipidemia   . ED (erectile dysfunction)   . Aortic valve stenosis, acquired 2001    s/p Porcine AVR; by 05/2009 Echo - EF 45-50%, moderate AS -- AVA 0.98 cm2, peak gradient 96mmHg  . Right renal artery stenosis 2012    90% stenosis - stable  . S/P cardiac pacemaker procedure, Medtronic Adapta L  ADDRL1, 01/21/12 01/22/2012    Past Surgical History  Procedure Laterality Date  . Coronary artery bypass graft  2001    Lucianne Lei trigt ) aortic valve replacement 2001; LIMA-LAD.  Marland Kitchen Esophagogastroduodenoscopy      multiple, Colon/ EGD benign 11/2004  . Pilonidal cyst / sinus excision  1953  . Inguinal hernia  repair      LIH 1997Llap. bilateral hernias 1998  . Coronary stent placement  11/2007    8/09  Left Circumflex Stent by Dr Toy Care started and aggrenox stopped  . Cataract extraction  06/09    left  . Laparoscopic cholecystectomy  07/11    Dr.Rosenbower  . Squamous cell carcinoma excision  3/13    back  . Aortic valve replacement  2001    Porcine valve  . Pacemaker insertion  01/21/2012    Medtronic  . Nm myocar perf wall motion  11/22/2007    mild septal ischemia,EF 66%    Family History  Problem Relation Age of Onset  . Heart disease Brother   . Asthma Father     History   Social History  . Marital Status: Married    Spouse Name: N/A    Number of Children: 3  . Years of Education: N/A   Occupational History    . retired- [employer]   . part-time sub/courier for schools- now only rarely    Social History Main Topics  . Smoking status: Former Smoker -- 1.00 packs/day for 5 years    Types: Cigarettes    Quit date: 06/06/1963  . Smokeless tobacco: Never Used  . Alcohol Use: No     Comment: very rare  . Drug Use: No  . Sexual Activity: No   Other Topics Concern  . Not on file   Social History Narrative   Has living will   Wife, then son [name], hold health care POA   Requests DNR --done   Requests no tube feeds if cognitively unaware   Review of Systems Appetite remains good Reviewed his weight curve over the past 2 years    Objective:   Physical Exam  Constitutional: He appears well-developed and well-nourished. No distress.  Neck: Normal range of motion. Neck supple. No thyromegaly present.  Cardiovascular: Normal rate, regular rhythm, normal heart sounds and intact distal pulses.  Exam reveals no gallop.   No murmur heard. Pulmonary/Chest: Effort normal and breath sounds normal. No respiratory distress. He has no wheezes. He has no rales.  Abdominal: Soft. He exhibits no distension and no mass.  No HSM  Musculoskeletal:  1+ pitting edema  Lymphadenopathy:    He has no cervical adenopathy.  Psychiatric:  Anxious about the swelling          Assessment & Plan:

## 2013-10-09 NOTE — Patient Instructions (Signed)
Please try support socks from a medical supply store.

## 2013-10-09 NOTE — Assessment & Plan Note (Signed)
Okay to go back on the amantadine since it hasn't made any difference off it for the swelling

## 2013-10-30 ENCOUNTER — Encounter: Payer: Self-pay | Admitting: Cardiovascular Disease

## 2013-10-30 ENCOUNTER — Ambulatory Visit (INDEPENDENT_AMBULATORY_CARE_PROVIDER_SITE_OTHER): Payer: Medicare Other | Admitting: Cardiovascular Disease

## 2013-10-30 VITALS — BP 118/58 | HR 60 | Ht 68.0 in | Wt 142.6 lb

## 2013-10-30 DIAGNOSIS — I350 Nonrheumatic aortic (valve) stenosis: Secondary | ICD-10-CM

## 2013-10-30 DIAGNOSIS — I2510 Atherosclerotic heart disease of native coronary artery without angina pectoris: Secondary | ICD-10-CM

## 2013-10-30 DIAGNOSIS — Z950 Presence of cardiac pacemaker: Secondary | ICD-10-CM

## 2013-10-30 DIAGNOSIS — I4729 Other ventricular tachycardia: Secondary | ICD-10-CM | POA: Diagnosis not present

## 2013-10-30 DIAGNOSIS — R609 Edema, unspecified: Secondary | ICD-10-CM | POA: Diagnosis not present

## 2013-10-30 DIAGNOSIS — I472 Ventricular tachycardia: Secondary | ICD-10-CM | POA: Diagnosis not present

## 2013-10-30 DIAGNOSIS — I359 Nonrheumatic aortic valve disorder, unspecified: Secondary | ICD-10-CM

## 2013-10-30 DIAGNOSIS — I10 Essential (primary) hypertension: Secondary | ICD-10-CM | POA: Diagnosis not present

## 2013-10-30 NOTE — Progress Notes (Signed)
10/30/2013 [name]   April 10, 1929  [id number]  Primary Physician Viviana Simpler, MD Primary Cardiologist: Lorretta Harp MD Renae Gloss   HPI:  The patient is an 80 year old, thin appearing, married, Caucasian male, father of three, grandfather to 35, who is accompanied by his wife today. He is formerly a patient of Dr. Durwin Nora Little's. I saw him a year ago for peripheral vascular evaluation because of renal artery stenosis. He has a history of coronary artery disease status post bypass grafting in 2001 with a LIMA to his LAD and a porcine valve at that time. He did have a bare metal stent placed to his circumflex coronary artery by Dr. Adrian Prows on December 05, 2007. At that time, his LIMA was patent and his RCA had only mild disease. Abdominal aortography revealed a widely patent left renal artery with a 90% right renal artery stenosis. His last Myoview performed in 2009 was nonischemic. An echo performed in 2011 revealed an aortic valve area of 0.9 cm squared with an EF of 45 to 50% and mild concentric LVH. His other problems include hypertension and hyperlipidemia. He does have progressive Parkinson's disease. When I saw him in the office back in 2013 he was significantly bradycardic and underwent permanent transvenous pacemaker insertion which is currently followed by Dr. Sallyanne Kuster. He's had progressive shortness of breath or lower extremity edema as well as unexplained weight loss. He was placed on 40 mg of Lasix which resulted in some improvement in his edema and his dyspnea. He also has a 90% right renal artery stenosis hippopotamus duplex ultrasound that has remained stable.    Current Outpatient Prescriptions  Medication Sig Dispense Refill  . amantadine (SYMMETREL) 100 MG capsule Take 1 capsule (100 mg total) by mouth 3 (three) times daily.  270 capsule  3  . aspirin 325 MG tablet Take 325 mg by mouth daily.      . bisoprolol-hydrochlorothiazide (ZIAC) 5-6.25 MG per  tablet Take 1 tablet by mouth daily.  90 tablet  3  . Carbidopa-Levodopa ER (SINEMET CR) 25-100 MG tablet controlled release Take 2 tablets by mouth 5 (five) times daily.  900 tablet  3  . furosemide (LASIX) 40 MG tablet Take 1 tablet (40 mg total) by mouth daily.  90 tablet  3  . polyethylene glycol (MIRALAX / GLYCOLAX) packet Take 17 g by mouth daily.      . potassium chloride (K-DUR) 10 MEQ tablet Take 1 tablet (10 mEq total) by mouth 2 (two) times daily.  180 tablet  3  . vitamin B-12 (CYANOCOBALAMIN) 1000 MCG tablet Take 1,000 mcg by mouth daily.       No current facility-administered medications for this visit.    Allergies  Allergen Reactions  . Plavix [Clopidogrel]     Fatigue   . Pravastatin   . Zocor [Simvastatin]     REACTION: dizziness  . Penicillins Rash    History   Social History  . Marital Status: Married    Spouse Name: N/A    Number of Children: 3  . Years of Education: N/A   Occupational History  . retired- [employer]   . part-time sub/courier for schools- now only rarely    Social History Main Topics  . Smoking status: Former Smoker -- 1.00 packs/day for 5 years    Types: Cigarettes    Quit date: 06/06/1963  . Smokeless tobacco: Never Used  . Alcohol Use: No     Comment:  very rare  . Drug Use: No  . Sexual Activity: No   Other Topics Concern  . Not on file   Social History Narrative   Has living will   Wife, then son [name], hold health care POA   Requests DNR --done   Requests no tube feeds if cognitively unaware     Review of Systems: General: negative for chills, fever, night sweats or weight changes.  Cardiovascular: negative for chest pain, dyspnea on exertion, edema, orthopnea, palpitations, paroxysmal nocturnal dyspnea or shortness of breath Dermatological: negative for rash Respiratory: negative for cough or wheezing Urologic: negative for hematuria Abdominal: negative for nausea, vomiting, diarrhea, bright red blood  per rectum, melena, or hematemesis Neurologic: negative for visual changes, syncope, or dizziness All other systems reviewed and are otherwise negative except as noted above.    Blood pressure 118/58, pulse 60, height 5\' 8"  (1.727 m), weight 142 lb 9.6 oz (64.683 kg).  General appearance: alert and no distress Neck: no adenopathy, no carotid bruit, no JVD, supple, symmetrical, trachea midline and thyroid not enlarged, symmetric, no tenderness/mass/nodules Lungs: clear to auscultation bilaterally Heart: regular rate and rhythm, S1, S2 normal, no murmur, click, rub or gallop Extremities: 1-2+ pitting edema bilaterally  EKG ventricular paced rhythm  ASSESSMENT AND PLAN:   S/P cardiac pacemaker procedure, Medtronic Adapta L  ADDRL1, 01/21/12 Followed by Dr. Sallyanne Kuster. She recently saw him in the office 09/12/13 100% ventricular pacing  HYPERTENSION Controlled on current medications  Edema He has 1-2+ bilateral lower extremity edema for unclear reasons. He is on 40 mg of Lasix. I am hesitant to increase his diuretic because of orthostasis and dizziness. His EF is normal at 1 it was last checked a 2-D echo and his vesicular without is not stenosed enough to be causing his symptoms.  CORONARY ARTERY DISEASE --  s/p LIMA-LAD & PCI to LCx History of coronary artery bypass grafting in 2001 with LIMA to his LAD and a porcine aortic valve at that time. He underwent stenting by Dr. Einar Gip 12/05/07 with a 4.0 X 28 mm long vision bare-metal stent.he denies chest pain but does get occasional dyspnea which was somewhat improved with diuretics.  Aortic valve stenosis, acquired -- s/p AVR with Porcie Bioprosthetic AVR; By Echo in 05/2009 moderate stenosis,AVA 0.98cm. Echo performed in October of last year revealed normal systolic function with a prosthetic aortic valve area of 1.1 cm      Lorretta Harp MD [facility], [facility] 10/30/2013 12:12 PM

## 2013-10-30 NOTE — Assessment & Plan Note (Signed)
Controlled on current medications 

## 2013-10-30 NOTE — Assessment & Plan Note (Signed)
He has 1-2+ bilateral lower extremity edema for unclear reasons. He is on 40 mg of Lasix. I am hesitant to increase his diuretic because of orthostasis and dizziness. His EF is normal at 1 it was last checked a 2-D echo and his vesicular without is not stenosed enough to be causing his symptoms.

## 2013-10-30 NOTE — Patient Instructions (Signed)
We request that you follow-up in: 6 months with an extender and in 1 year with Dr Berry  You will receive a reminder letter in the mail two months in advance. If you don't receive a letter, please call our office to schedule the follow-up appointment.   

## 2013-10-30 NOTE — Assessment & Plan Note (Signed)
History of coronary artery bypass grafting in 2001 with LIMA to his LAD and a porcine aortic valve at that time. He underwent stenting by Dr. Einar Gip 12/05/07 with a 4.0 X 28 mm long vision bare-metal stent.he denies chest pain but does get occasional dyspnea which was somewhat improved with diuretics.

## 2013-10-30 NOTE — Assessment & Plan Note (Signed)
Echo performed in October of last year revealed normal systolic function with a prosthetic aortic valve area of 1.1 cm

## 2013-10-30 NOTE — Assessment & Plan Note (Signed)
Followed by Dr. Sallyanne Kuster. She recently saw him in the office 09/12/13 100% ventricular pacing

## 2013-10-31 ENCOUNTER — Ambulatory Visit: Payer: Medicare Other

## 2013-11-12 ENCOUNTER — Ambulatory Visit (INDEPENDENT_AMBULATORY_CARE_PROVIDER_SITE_OTHER): Payer: Medicare Other | Admitting: Neurology

## 2013-11-12 ENCOUNTER — Encounter: Payer: Self-pay | Admitting: Neurology

## 2013-11-12 VITALS — BP 112/62 | HR 72 | Resp 16 | Ht 68.0 in | Wt 143.0 lb

## 2013-11-12 DIAGNOSIS — H5316 Psychophysical visual disturbances: Secondary | ICD-10-CM | POA: Diagnosis not present

## 2013-11-12 DIAGNOSIS — G3184 Mild cognitive impairment, so stated: Secondary | ICD-10-CM | POA: Diagnosis not present

## 2013-11-12 DIAGNOSIS — R441 Visual hallucinations: Secondary | ICD-10-CM

## 2013-11-12 DIAGNOSIS — I2510 Atherosclerotic heart disease of native coronary artery without angina pectoris: Secondary | ICD-10-CM | POA: Diagnosis not present

## 2013-11-12 DIAGNOSIS — G20 Parkinson's disease: Secondary | ICD-10-CM | POA: Diagnosis not present

## 2013-11-12 NOTE — Progress Notes (Signed)
Ian [name] was seen today in the movement disorders clinic for f/u.  He is accompanied by his wife who supplements the history.  The first symptom(s) the patient noticed was tremor in the R hand when he would use the hand or have a cup of coffee in it.  His wife believes that this began in 2004.  He was referred to Dr. Tilden [name].  He tried some medication and according to his wife, he kept having adverse SE.  He was on mirapex and sinemet but reported dizziness and bad dreams with both.   He was referred to Dr. Yevonne [name] in about 2010 because he was interested in DBS but the patient was concerned about risks of the surgery and potential side effects.    09/28/12  I tried to change the levodopa back to the IR formulation but he did not like it because he was too drowsy and went back to the CR formulation.  , 2 po qid and carbidopa/levodopa 50/200 at night.    He has continued to have more tremor after the artane was d/c.  However, this has become less bothersome for him over time.  He does notice that sometimes the medication wears off quickly and sometimes it does not.  It seems inconsistent.  03/06/13 update:  The patient is accompanied by his wife who supplements the history.  Last visit, we tried to add entacapone to each of the 4 carbidopa/levodopa dosing times.  He called me because of nausea and we decreased it to twice a day dosing.  He continued to have nausea and the medication was discontinued.  The patient states that the medication makes his arms feel "heavy." He has stopped the carbidopa/levodopa 50/200 at night as he does not think it was beneficial.  Sometimes, he will wake up in the middle of the night and take an extra of the carbidopa/levodopa CR 25 100.  He and his wife have noted some jerking spells at night.  He sleeps very restless. I did review notes in his chart since last visit.  He went to the emergency room on September 21 with a sensation of palpitations and tachycardia.  His  pacemaker was interrogated and there were no problems and he was subsequently sent home.  06/11/13 update:  This patient is accompanied in the office by his spouse who supplements the history.  Pt is on carbidopa/levodopa 25/100 CR - 2 in the AM and 6 other dosages throughout the day.  When he awakens in the middle of the night he may take a few extra dosages and he estimates he takes a total daily levodopa dose of 1000-1200 mg per day.  He has been very senstive to medication and has not been able to tolerate the immediate release carbidopa/levodopa, entacapone or klonopin (made arms/legs hurt).  Tremor feels like it is getting worse.  Vision is getting worse but he went to the eye doctor and his vision was stable.  He feels like the levodopa only lasts 1.5-2 hrs.  No falls.  Is exercising.    08/13/13 update:   This patient is accompanied in the office by his spouse who supplements the history.  Pt is on carbidopa/levodopa 25/100, and takes approximately 8-10 tablets per day.  Last visit, I also added amantadine to see if that would help the tremor. He does think that has helped.  He presents today to try and change to rytary to see if it would last longer.  He does not wish to  try duopa.  He denies hallucinations, lightheadedness, falls, syncope or dyskinesia.     10/01/13 update:  Last visit, I changed the patient to rytary.  It turned out to be very expensive as the patient had no RX coverage, which I did not realize.  When he changed back, the IR formulation was accidentally called in instead of the CR prepartation (and he didn't tolerate the IR in the past).  The patient also didn't notice that and he isn't even sure now what he is taking but knows he is taking 2 po qid of either the CR or IR.  He is having hallucinations.  He is having swelling in the feet and legs as well.   They saw cardiology last week and were started on lasix and they didn't think that it made a difference.  They are worried  about a discoloration in legs.  He has lost weight.  Had one fall on May 10.  Walking into bedroom, got lightheaded and hit his back on the dresser.  He didn't think that he hit his head at the time but now that they think that he did as he has had hallucinations since.  However, looking back at our notes, it was 5/7 when he called here to d/c the rytary and changed to the IR levodopa it was 5/10 when he fell so timing was likely just coincidental as he had likely just changed back to the IR preparation.  11/12/13 update:  The patient was seen back in followup in the neurology clinic, accompanied by his wife who helps to supplement the history.  Patient has a history of Parkinson's disease.  Last visit, I changed his levodopa back to the CR preparation to see if that would help his hallucinations.  He is currently on 2 tablets by mouth 4 times per day (8am/12/4pm/8pm).  Today, the patient states that hallucinations continue.  He states that he continues to see people.   Medication helps tremor when he first takes it and then the tremor comes back before the next dose.   Last visit, I told him to discontinue his amantadine because of complaints of swelling.  I did review records since our last visit.  Because he noticed no changes in swelling after discontinuation of the amantadine, he followed up with his primary care physician.  His primary care physician felt that the swelling was likely due to venous insufficiency and he recommended that the patient restart the amantadine and he has.  The patient does state that he doesn't think that he had any break between d/c the IR carbidopa/levodopa 25/100, starting the CR version and when he restarted the amantadine.   The patient did see his cardiologist and he saw no cardiac reason for the edema.  He did not want to increase the patient's Lasix further because of complaints of orthostasis and dizziness.  No significant etiology was determined for his weight loss  either.  Neuroimaging has previously been performed.  It is not available for my review today.  PREVIOUS MEDICATIONS: Sinemet (reported bad dreams/dizziness on regular formulation and same with Mirapex); requip;  on Sinemet CR currently, entacapone (felt dizzy and arms felt "heavy"); klonopin (made arms/legs hurt); artane  ALLERGIES:   Allergies  Allergen Reactions  . Plavix [Clopidogrel]     Fatigue   . Pravastatin   . Zocor [Simvastatin]     REACTION: dizziness  . Penicillins Rash    CURRENT MEDICATIONS:  Current Outpatient Prescriptions on File Prior to Visit  Medication Sig Dispense Refill  . amantadine (SYMMETREL) 100 MG capsule Take 1 capsule (100 mg total) by mouth 3 (three) times daily.  270 capsule  3  . bisoprolol-hydrochlorothiazide (ZIAC) 5-6.25 MG per tablet Take 1 tablet by mouth daily.  90 tablet  3  . Carbidopa-Levodopa ER (SINEMET CR) 25-100 MG tablet controlled release Take 2 tablets by mouth 5 (five) times daily.  900 tablet  3  . furosemide (LASIX) 40 MG tablet Take 1 tablet (40 mg total) by mouth daily.  90 tablet  3  . polyethylene glycol (MIRALAX / GLYCOLAX) packet Take 17 g by mouth daily.      . potassium chloride (K-DUR) 10 MEQ tablet Take 1 tablet (10 mEq total) by mouth 2 (two) times daily.  180 tablet  3  . vitamin B-12 (CYANOCOBALAMIN) 1000 MCG tablet Take 1,000 mcg by mouth daily.       No current facility-administered medications on file prior to visit.    PAST MEDICAL HISTORY:   Past Medical History  Diagnosis Date  . CAD (coronary artery disease) CABG x1 2001; PCI 2008    s/p LIMA-LAD in2001 with AVR; BMS to LCx 8/08  . Allergy   . Diverticulitis   . GERD (gastroesophageal reflux disease)   . Hypertension   . Osteoarthritis   . Osteoporosis   . MALT lymphoma   . Parkinson's disease   . ASCVD (arteriosclerotic cardiovascular disease) 06/08    TIA  . Hyperlipidemia   . ED (erectile dysfunction)   . Aortic valve stenosis, acquired 2001     s/p Porcine AVR; by 05/2009 Echo - EF 45-50%, moderate AS -- AVA 0.98 cm2, peak gradient 6mmHg  . Right renal artery stenosis 2012    90% stenosis - stable  . S/P cardiac pacemaker procedure, Medtronic Adapta L  ADDRL1, 01/21/12 01/22/2012    PAST SURGICAL HISTORY:   Past Surgical History  Procedure Laterality Date  . Coronary artery bypass graft  2001    Lucianne Lei trigt ) aortic valve replacement 2001; LIMA-LAD.  Marland Kitchen Esophagogastroduodenoscopy      multiple, Colon/ EGD benign 11/2004  . Pilonidal cyst / sinus excision  1953  . Inguinal hernia repair      LIH 1997Llap. bilateral hernias 1998  . Coronary stent placement  11/2007    8/09  Left Circumflex Stent by Dr Toy Care started and aggrenox stopped  . Cataract extraction  06/09    left  . Laparoscopic cholecystectomy  07/11    Dr.Rosenbower  . Squamous cell carcinoma excision  3/13    back  . Aortic valve replacement  2001    Porcine valve  . Pacemaker insertion  01/21/2012    Medtronic  . Nm myocar perf wall motion  11/22/2007    mild septal ischemia,EF 66%    SOCIAL HISTORY:   History   Social History  . Marital Status: Married    Spouse Name: N/A    Number of Children: 3  . Years of Education: N/A   Occupational History  . retired- [employer]   . part-time sub/courier for schools- now only rarely    Social History Main Topics  . Smoking status: Former Smoker -- 1.00 packs/day for 5 years    Types: Cigarettes    Quit date: 06/06/1963  . Smokeless tobacco: Never Used  . Alcohol Use: No     Comment: very rare  . Drug Use: No  . Sexual Activity: No   Other Topics Concern  .  Not on file   Social History Narrative   Has living will   Wife, then son [name], hold health care POA   Requests DNR --done   Requests no tube feeds if cognitively unaware    FAMILY HISTORY:   Family Status  Relation Status Death Age  . Mother Deceased 46    old age  . Father Deceased 62    acute bronchitis, cotton  exposure  . Sister Deceased     trauma  . Brother Deceased     2, deceased  . Daughter Deceased 37    osteosarcoma  . Child Alive     3, healthy  . Brother Alive     healthy  . Sister Alive     healthy    ROS:    A complete 10 system review of systems was obtained and was unremarkable apart from what is mentioned above.  PHYSICAL EXAMINATION:    VITALS:   Filed Vitals:   11/12/13 1342  BP: 112/62  Pulse: 72  Resp: 16  Height: 5\' 8"  (1.727 m)  Weight: 143 lb (64.864 kg)   Wt Readings from Last 3 Encounters:  11/12/13 143 lb (64.864 kg)  10/30/13 142 lb 9.6 oz (64.683 kg)  10/09/13 143 lb (64.864 kg)     GEN:  The patient appears stated age and is in NAD. HEENT:  Normocephalic, atraumatic.  The mucous membranes are moist. The superficial temporal arteries are without ropiness or tenderness. CV:  RRR Lungs:  CTAB Neck/HEME:  There are no carotid bruits bilaterally.  Neurological examination:  Orientation: The patient is alert and oriented x3.  He is a very good historian today.  Movement examination: Tone: There is normal tone in the bilateral upper extremities today.  The tone in the lower extremities is normal.  Abnormal movements: There is a moderate RUE resting tremor and mild-mod LUE resting tremor.  No dyskinesia. Coordination:  There is miminal decremation with RAM's, seen mostly with hand opening and closing on the R Gait and Station: The patient has mild difficulty arising out of a deep-seated chair without the use of the hands. The patient's stride length is normal.  He has postural instability.    Lab Results  Component Value Date   WBC 8.5 01/07/2013   HGB 14.0 01/07/2013   HCT 41.2 01/07/2013   MCV 92.0 01/07/2013   PLT 250 01/07/2013     Chemistry      Component Value Date/Time   NA 139 09/05/2013 1329   K 4.4 09/05/2013 1329   CL 100 09/05/2013 1329   CO2 31 09/05/2013 1329   BUN 15 09/05/2013 1329   CREATININE 0.89 09/05/2013 1329   CREATININE  0.73 01/07/2013 0653      Component Value Date/Time   CALCIUM 10.1 09/05/2013 1329   ALKPHOS 116 09/05/2013 1329   AST 15 09/05/2013 1329   ALT 10 09/05/2013 1329   BILITOT 0.8 09/05/2013 1329     Lab Results  Component Value Date   VITAMINB12 660 03/06/2013   Lab Results  Component Value Date   TSH 2.10 03/29/2012     ASSESSMENT/PLAN:  1.  Idiopathic Parkinsons disease.    -We discussed the diagnosis as well as pathophysiology of the disease.  We discussed treatment options as well as prognostic indicators.  Patient education was provided.  -His hallucinations did not change when we switched from the IR back to the CR version.  I suspect that the hallucinations are just part of  the Parkinson's disease, which is common, but I did ask him to go ahead and hold the amantadine for a week.  He held it previously to see if that would help the swelling, but was still on the IR version of the levodopa when he did that.  -Currently, he takes a total of 8 carbidopa/levodopa 25/100 CR tablets per day.  I am not going to change the overall number of tablets he takes, but will continue to have him take 2 tablets in the morning, but will have him spread the other 6 out more evenly throughout the day.  Perhaps  this will help with some of the lightheadedness and perhaps he will get somewhat better tremor control. 2.  Mild cognitive impairment, likely from longstanding PD.  -He is really doing much better in this regard since being off of the artane.  3.  visual hallucinations.  -We did discuss Seroquel.  We did talk about the fact that the atypical antipsychotic medications are not indicated for this and increased risk of mortality in the elderly, usually because of infectious or  cardiac related. Understanding is expressed and he decided that he didn't want to add further meds yet. 4.  B12 deficiency.  -He is on oral supplements and his last level was 660 when rechecked. 5.  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  -He  could not tolerate klonopin. 6.  weight loss.  -His weight has been fairly stable over the last few visits. 7.  I will see him back next month, sooner should new neurologic issues arise.

## 2013-11-12 NOTE — Patient Instructions (Signed)
1. Hold Amantadine for 1 week and let us know if hallucinations are better.  2. Change Carbidopa Levodopa as follows: Take two tablets at 8 am, 1 tablet at 10 am, 1 tablet at 12pm, 1 tablet at 2pm, 1 tablet at 4pm, 1 tablet at 6pm, 1 tablet at 8pm. 3. Follow up in 8 weeks.

## 2013-11-19 ENCOUNTER — Telehealth: Payer: Self-pay | Admitting: Neurology

## 2013-11-19 NOTE — Telephone Encounter (Signed)
Spoke with patient's wife. She states after being off Amantadine that patient's swelling is not any better in feet and legs. She states they have seen PCP about this who suggested compression stocking which he wore for two days and stopped, saying that they did not work. They state they have spoke to Cardiology who state it is not heart related. I recommended speaking with PCP again since no better, they believe it is a kidney issue. They want to know if Dr Tat has any other suggestions. I advised this is not our specialty but I would forward to Dr Tat and see if she has recommendations. Aware Dr Tat not available until Wednesday.

## 2013-11-19 NOTE — Telephone Encounter (Signed)
Pt 's spouse called requesting to speak to a nurse regarding his meds. Please call pt's spouse at [phone]

## 2013-11-20 NOTE — Telephone Encounter (Signed)
They asked me this last visit, and I told them if it wasn't the amantadine, I didn't know.  However, I think that the PCP did think that it could be venous stasis, but this is really out of my field and think that your advice was good.

## 2013-11-20 NOTE — Telephone Encounter (Signed)
Patient's wife made aware.

## 2013-11-29 DIAGNOSIS — N400 Enlarged prostate without lower urinary tract symptoms: Secondary | ICD-10-CM | POA: Diagnosis not present

## 2013-11-29 DIAGNOSIS — R609 Edema, unspecified: Secondary | ICD-10-CM | POA: Diagnosis not present

## 2013-11-30 ENCOUNTER — Telehealth: Payer: Self-pay | Admitting: Cardiovascular Disease

## 2013-11-30 MED ORDER — SILDENAFIL CITRATE 50 MG PO TABS: 50.0000 mg | ORAL_TABLET | Freq: Every day | ORAL | Status: DC | PRN

## 2013-11-30 NOTE — Addendum Note (Signed)
Addended by: Janett Labella A on: 11/30/2013 05:05 PM   Modules accepted: Orders

## 2013-11-30 NOTE — Telephone Encounter (Signed)
Pt called in requesting a rx for 16 tabs of Viagra and he said that he can come and pick them up Monday. Please call  Thanks

## 2013-11-30 NOTE — Telephone Encounter (Signed)
Would like to try Viagra for ED.  Has tried Cialis in the past.  States his insurance will cover #16 tablets and would like a Rx he can take to the pharmacy himself.  Will send this message to Dr. Loletha Grayer for review to see if he will prescribe.

## 2013-11-30 NOTE — Telephone Encounter (Signed)
OK to Rx as he requested

## 2013-12-04 ENCOUNTER — Telehealth: Payer: Self-pay | Admitting: Cardiovascular Disease

## 2013-12-04 NOTE — Telephone Encounter (Signed)
Pt stated that he called in inquiring about getting a prescription from Dr. Loletha Grayer for  Viagra not didn't hear anything back . Today he mentioned that there is a running promotion on Cialis and would prefer a rx for that. Please call  Thanks

## 2013-12-05 MED ORDER — TADALAFIL 10 MG PO TABS: 10.0000 mg | ORAL_TABLET | ORAL | Status: DC | PRN

## 2013-12-05 NOTE — Telephone Encounter (Signed)
Already approved the Rx for Viagra - should be sent in. But he can have Cialis 10 mg prn, #10, RF3 instead

## 2013-12-05 NOTE — Telephone Encounter (Signed)
Spoke with patient for clarification on which Rx he needed/wanted filled. Patient stated he saw that Cialis was having a promotion and he wanted to try that as long as Dr.C approved. Message was routed to Dr.C who said wether Viagra or Cialis is fine. Rx for Cialis was sent into local pharmacy for patient.

## 2013-12-05 NOTE — Telephone Encounter (Signed)
Pt called again,please call him when you call his prescription in.

## 2013-12-07 ENCOUNTER — Other Ambulatory Visit: Payer: Self-pay | Admitting: *Deleted

## 2013-12-07 ENCOUNTER — Telehealth: Payer: Self-pay | Admitting: Cardiovascular Disease

## 2013-12-07 MED ORDER — SILDENAFIL CITRATE 50 MG PO TABS: 50.0000 mg | ORAL_TABLET | ORAL | Status: DC | PRN

## 2013-12-07 NOTE — Telephone Encounter (Signed)
His insurance will not cover Cialis. Wants to know if the doctor would call in [employer] will pay for that and they have discount coupons for that.

## 2013-12-07 NOTE — Telephone Encounter (Signed)
Ok with [employer]. I think Brittney has taken care of it

## 2013-12-07 NOTE — Telephone Encounter (Signed)
Spoke with pharmacist and changed Rx around so patient could use coupon  to receive  Viagra for out of town trip with wife. Rx was changed to 100 mg and Sig to take 1/2 pill PRN

## 2013-12-07 NOTE — Telephone Encounter (Signed)
Will forward for dr c review 

## 2013-12-13 ENCOUNTER — Encounter: Payer: Medicare Other | Admitting: *Deleted

## 2013-12-13 ENCOUNTER — Telehealth: Payer: Self-pay | Admitting: Cardiology

## 2013-12-13 NOTE — Telephone Encounter (Signed)
Confirmed remote transmission with pt wife.

## 2013-12-14 ENCOUNTER — Encounter: Payer: Self-pay | Admitting: Cardiology

## 2013-12-20 ENCOUNTER — Telehealth: Payer: Self-pay | Admitting: Cardiovascular Disease

## 2013-12-20 NOTE — Telephone Encounter (Signed)
New Message  Pt called back to discuss if the transmission from 12/13/2013 was received//sr

## 2013-12-20 NOTE — Telephone Encounter (Signed)
LMOVM informing pt that we did not receive transmission on 12-13-2013

## 2013-12-21 ENCOUNTER — Other Ambulatory Visit: Payer: Self-pay | Admitting: Gastroenterology

## 2013-12-21 DIAGNOSIS — R109 Unspecified abdominal pain: Secondary | ICD-10-CM | POA: Diagnosis not present

## 2013-12-21 DIAGNOSIS — K5900 Constipation, unspecified: Secondary | ICD-10-CM | POA: Diagnosis not present

## 2013-12-21 DIAGNOSIS — R634 Abnormal weight loss: Secondary | ICD-10-CM | POA: Diagnosis not present

## 2013-12-27 ENCOUNTER — Ambulatory Visit
Admission: RE | Admit: 2013-12-27 | Discharge: 2013-12-27 | Disposition: A | Discharge status: Home / Self Care | Payer: Medicare Other | Source: Ambulatory Visit | Attending: Gastroenterology | Admitting: Gastroenterology

## 2013-12-27 DIAGNOSIS — R109 Unspecified abdominal pain: Secondary | ICD-10-CM

## 2013-12-27 DIAGNOSIS — K4020 Bilateral inguinal hernia, without obstruction or gangrene, not specified as recurrent: Secondary | ICD-10-CM | POA: Diagnosis not present

## 2013-12-27 DIAGNOSIS — K5730 Diverticulosis of large intestine without perforation or abscess without bleeding: Secondary | ICD-10-CM | POA: Diagnosis not present

## 2013-12-27 DIAGNOSIS — Z9089 Acquired absence of other organs: Secondary | ICD-10-CM | POA: Diagnosis not present

## 2013-12-27 DIAGNOSIS — N400 Enlarged prostate without lower urinary tract symptoms: Secondary | ICD-10-CM | POA: Diagnosis not present

## 2013-12-27 MED ORDER — IOHEXOL 300 MG/ML  SOLN
100.0000 mL | Freq: Once | INTRAMUSCULAR | Status: AC | PRN
  Administered 2013-12-27: 100 mL via INTRAVENOUS

## 2014-01-07 ENCOUNTER — Encounter: Payer: Self-pay | Admitting: Neurology

## 2014-01-07 ENCOUNTER — Ambulatory Visit (INDEPENDENT_AMBULATORY_CARE_PROVIDER_SITE_OTHER): Payer: Medicare Other | Admitting: Neurology

## 2014-01-07 VITALS — BP 142/82 | HR 88 | Ht 68.0 in | Wt 145.0 lb

## 2014-01-07 DIAGNOSIS — G20 Parkinson's disease: Secondary | ICD-10-CM

## 2014-01-07 DIAGNOSIS — E538 Deficiency of other specified B group vitamins: Secondary | ICD-10-CM | POA: Diagnosis not present

## 2014-01-07 DIAGNOSIS — R441 Visual hallucinations: Secondary | ICD-10-CM

## 2014-01-07 DIAGNOSIS — H5316 Psychophysical visual disturbances: Secondary | ICD-10-CM | POA: Diagnosis not present

## 2014-01-07 DIAGNOSIS — G3184 Mild cognitive impairment, so stated: Secondary | ICD-10-CM

## 2014-01-07 DIAGNOSIS — I2510 Atherosclerotic heart disease of native coronary artery without angina pectoris: Secondary | ICD-10-CM

## 2014-01-07 NOTE — Progress Notes (Signed)
Ian [name] was seen today in the movement disorders clinic for f/u.  He is accompanied by his wife who supplements the history.  The first symptom(s) the patient noticed was tremor in the R hand when he would use the hand or have a cup of coffee in it.  His wife believes that this began in 2004.  He was referred to Dr. Tilden Dome.  He tried some medication and according to his wife, he kept having adverse SE.  He was on mirapex and sinemet but reported dizziness and bad dreams with both.   He was referred to Dr. Yevonne Pax in about 2010 because he was interested in DBS but the patient was concerned about risks of the surgery and potential side effects.    09/28/12  I tried to change the levodopa back to the IR formulation but he did not like it because he was too drowsy and went back to the CR formulation.  , 2 po qid and carbidopa/levodopa 50/200 at night.    He has continued to have more tremor after the artane was d/c.  However, this has become less bothersome for him over time.  He does notice that sometimes the medication wears off quickly and sometimes it does not.  It seems inconsistent.  03/06/13 update:  The patient is accompanied by his wife who supplements the history.  Last visit, we tried to add entacapone to each of the 4 carbidopa/levodopa dosing times.  He called me because of nausea and we decreased it to twice a day dosing.  He continued to have nausea and the medication was discontinued.  The patient states that the medication makes his arms feel "heavy." He has stopped the carbidopa/levodopa 50/200 at night as he does not think it was beneficial.  Sometimes, he will wake up in the middle of the night and take an extra of the carbidopa/levodopa CR 25 100.  He and his wife have noted some jerking spells at night.  He sleeps very restless. I did review notes in his chart since last visit.  He went to the emergency room on September 21 with a sensation of palpitations and tachycardia.  His  pacemaker was interrogated and there were no problems and he was subsequently sent home.  06/11/13 update:  This patient is accompanied in the office by his spouse who supplements the history.  Pt is on carbidopa/levodopa 25/100 CR - 2 in the AM and 6 other dosages throughout the day.  When he awakens in the middle of the night he may take a few extra dosages and he estimates he takes a total daily levodopa dose of 1000-1200 mg per day.  He has been very senstive to medication and has not been able to tolerate the immediate release carbidopa/levodopa, entacapone or klonopin (made arms/legs hurt).  Tremor feels like it is getting worse.  Vision is getting worse but he went to the eye doctor and his vision was stable.  He feels like the levodopa only lasts 1.5-2 hrs.  No falls.  Is exercising.    08/13/13 update:   This patient is accompanied in the office by his spouse who supplements the history.  Pt is on carbidopa/levodopa 25/100, and takes approximately 8-10 tablets per day.  Last visit, I also added amantadine to see if that would help the tremor. He does think that has helped.  He presents today to try and change to rytary to see if it would last longer.  He does not wish to  try duopa.  He denies hallucinations, lightheadedness, falls, syncope or dyskinesia.     10/01/13 update:  Last visit, I changed the patient to rytary.  It turned out to be very expensive as the patient had no RX coverage, which I did not realize.  When he changed back, the IR formulation was accidentally called in instead of the CR prepartation (and he didn't tolerate the IR in the past).  The patient also didn't notice that and he isn't even sure now what he is taking but knows he is taking 2 po qid of either the CR or IR.  He is having hallucinations.  He is having swelling in the feet and legs as well.   They saw cardiology last week and were started on lasix and they didn't think that it made a difference.  They are worried  about a discoloration in legs.  He has lost weight.  Had one fall on May 10.  Walking into bedroom, got lightheaded and hit his back on the dresser.  He didn't think that he hit his head at the time but now that they think that he did as he has had hallucinations since.  However, looking back at our notes, it was 5/7 when he called here to d/c the rytary and changed to the IR levodopa it was 5/10 when he fell so timing was likely just coincidental as he had likely just changed back to the IR preparation.  11/12/13 update:  The patient was seen back in followup in the neurology clinic, accompanied by his wife who helps to supplement the history.  Patient has a history of Parkinson's disease.  Last visit, I changed his levodopa back to the CR preparation to see if that would help his hallucinations.  He is currently on 2 tablets by mouth 4 times per day (8am/12/4pm/8pm).  Today, the patient states that hallucinations continue.  He states that he continues to see people.   Medication helps tremor when he first takes it and then the tremor comes back before the next dose.   Last visit, I told him to discontinue his amantadine because of complaints of swelling.  I did review records since our last visit.  Because he noticed no changes in swelling after discontinuation of the amantadine, he followed up with his primary care physician.  His primary care physician felt that the swelling was likely due to venous insufficiency and he recommended that the patient restart the amantadine and he has.  The patient does state that he doesn't think that he had any break between d/c the IR carbidopa/levodopa 25/100, starting the CR version and when he restarted the amantadine.   The patient did see his cardiologist and he saw no cardiac reason for the edema.  He did not want to increase the patient's Lasix further because of complaints of orthostasis and dizziness.  No significant etiology was determined for his weight loss  either.  01/07/14 update:  The patient was seen today in followup in the neurology clinic, accompanied by his wife who helps to supplement the history.  The patient has switched back on his own from the CR version of carbidopa/levodopa to the immediate release form.  He is on 2 po 5 times per day (8am and then every 3 hours).  He is complaining about more tremor and more balance issues.  No falls but near falls.  He is off of amantadine.  Hallucinations went away after it was d/c but tremor definitely picked up.  Leg swelling is better but not gone.  Some memory problems.  No driving since march, 2015.    Neuroimaging has previously been performed.  It is not available for my review today.  PREVIOUS MEDICATIONS: Sinemet (reported bad dreams/dizziness on regular formulation and same with Mirapex); requip;  on Sinemet CR currently, entacapone (felt dizzy and arms felt "heavy"); klonopin (made arms/legs hurt); artane; amantadine (hallucinations); rytary (costly and pt has no RX coverage)  ALLERGIES:   Allergies  Allergen Reactions  . Plavix [Clopidogrel]     Fatigue   . Pravastatin   . Zocor [Simvastatin]     REACTION: dizziness  . Penicillins Rash    CURRENT MEDICATIONS:  Current Outpatient Prescriptions on File Prior to Visit  Medication Sig Dispense Refill  . aspirin 81 MG tablet Take 81 mg by mouth daily.      . bisoprolol-hydrochlorothiazide (ZIAC) 5-6.25 MG per tablet Take 1 tablet by mouth daily.  90 tablet  3  . furosemide (LASIX) 40 MG tablet Take 1 tablet (40 mg total) by mouth daily.  90 tablet  3  . polyethylene glycol (MIRALAX / GLYCOLAX) packet Take 17 g by mouth daily.      . potassium chloride (K-DUR) 10 MEQ tablet Take 1 tablet (10 mEq total) by mouth 2 (two) times daily.  180 tablet  3  . sildenafil (VIAGRA) 50 MG tablet Take 1 tablet (50 mg total) by mouth as needed for erectile dysfunction.  6 tablet  0  . vitamin B-12 (CYANOCOBALAMIN) 1000 MCG tablet Take 1,000 mcg by  mouth daily.       No current facility-administered medications on file prior to visit.    PAST MEDICAL HISTORY:   Past Medical History  Diagnosis Date  . CAD (coronary artery disease) CABG x1 2001; PCI 2008    s/p LIMA-LAD in2001 with AVR; BMS to LCx 8/08  . Allergy   . Diverticulitis   . GERD (gastroesophageal reflux disease)   . Hypertension   . Osteoarthritis   . Osteoporosis   . MALT lymphoma   . Parkinson's disease   . ASCVD (arteriosclerotic cardiovascular disease) 06/08    TIA  . Hyperlipidemia   . ED (erectile dysfunction)   . Aortic valve stenosis, acquired 2001    s/p Porcine AVR; by 05/2009 Echo - EF 45-50%, moderate AS -- AVA 0.98 cm2, peak gradient 95mmHg  . Right renal artery stenosis 2012    90% stenosis - stable  . S/P cardiac pacemaker procedure, Medtronic Adapta L  ADDRL1, 01/21/12 01/22/2012    PAST SURGICAL HISTORY:   Past Surgical History  Procedure Laterality Date  . Coronary artery bypass graft  2001    Lucianne Lei trigt ) aortic valve replacement 2001; LIMA-LAD.  Marland Kitchen Esophagogastroduodenoscopy      multiple, Colon/ EGD benign 11/2004  . Pilonidal cyst / sinus excision  1953  . Inguinal hernia repair      LIH 1997Llap. bilateral hernias 1998  . Coronary stent placement  11/2007    8/09  Left Circumflex Stent by Dr Toy Care started and aggrenox stopped  . Cataract extraction  06/09    left  . Laparoscopic cholecystectomy  07/11    Dr.Rosenbower  . Squamous cell carcinoma excision  3/13    back  . Aortic valve replacement  2001    Porcine valve  . Pacemaker insertion  01/21/2012    Medtronic  . Nm myocar perf wall motion  11/22/2007    mild septal ischemia,EF 66%  SOCIAL HISTORY:   History   Social History  . Marital Status: Married    Spouse Name: N/A    Number of Children: 3  . Years of Education: N/A   Occupational History  . retired- [employer]   . part-time sub/courier for schools- now only rarely    Social History Main  Topics  . Smoking status: Former Smoker -- 1.00 packs/day for 5 years    Types: Cigarettes    Quit date: 06/06/1963  . Smokeless tobacco: Never Used  . Alcohol Use: No     Comment: very rare  . Drug Use: No  . Sexual Activity: No   Other Topics Concern  . Not on file   Social History Narrative   Has living will   Wife, then son [name], hold health care POA   Requests DNR --done   Requests no tube feeds if cognitively unaware    FAMILY HISTORY:   Family Status  Relation Status Death Age  . Mother Deceased 31    old age  . Father Deceased 9    acute bronchitis, cotton exposure  . Sister Deceased     trauma  . Brother Deceased     2, deceased  . Daughter Deceased 34    osteosarcoma  . Child Alive     3, healthy  . Brother Alive     healthy  . Sister Alive     healthy    ROS:    A complete 10 system review of systems was obtained and was unremarkable apart from what is mentioned above.  PHYSICAL EXAMINATION:    VITALS:   Filed Vitals:   01/07/14 1051  BP: 142/82  Pulse: 88  Height: 5\' 8"  (1.727 m)  Weight: 145 lb (65.772 kg)   Wt Readings from Last 3 Encounters:  01/07/14 145 lb (65.772 kg)  11/12/13 143 lb (64.864 kg)  10/30/13 142 lb 9.6 oz (64.683 kg)     GEN:  The patient appears stated age and is in NAD. HEENT:  Normocephalic, atraumatic.  The mucous membranes are moist. The superficial temporal arteries are without ropiness or tenderness. CV:  RRR Lungs:  CTAB Neck/HEME:  There are no carotid bruits bilaterally.  Neurological examination:  Orientation: The patient is alert and oriented x3.  He is a very good historian today.  Movement examination: Tone: There is normal tone in the bilateral upper extremities today.  The tone in the lower extremities is normal.  Abnormal movements: There is a moderate RUE resting tremor bilaterally.  No dyskinesia. Coordination:  There is miminal decremation with RAM's, seen mostly with hand opening and  closing on the R Gait and Station: The patient has mild difficulty arising out of a deep-seated chair without the use of the hands. The patient's stride length is normal.  He has postural instability.    Lab Results  Component Value Date   WBC 8.5 01/07/2013   HGB 14.0 01/07/2013   HCT 41.2 01/07/2013   MCV 92.0 01/07/2013   PLT 250 01/07/2013     Chemistry      Component Value Date/Time   NA 139 09/05/2013 1329   K 4.4 09/05/2013 1329   CL 100 09/05/2013 1329   CO2 31 09/05/2013 1329   BUN 15 09/05/2013 1329   CREATININE 0.89 09/05/2013 1329   CREATININE 0.73 01/07/2013 0653      Component Value Date/Time   CALCIUM 10.1 09/05/2013 1329   ALKPHOS 116 09/05/2013 1329  AST 15 09/05/2013 1329   ALT 10 09/05/2013 1329   BILITOT 0.8 09/05/2013 1329     Lab Results  Component Value Date   VITAMINB12 660 03/06/2013   Lab Results  Component Value Date   TSH 2.10 03/29/2012     ASSESSMENT/PLAN:  1.  Idiopathic Parkinsons disease.    -We discussed the diagnosis as well as pathophysiology of the disease.  We discussed treatment options as well as prognostic indicators.  Patient education was provided.  -His hallucinations went away with discontinuation of amantadine.  -I. had a long discussion with the patient and wife about various options, which I think are somewhat limited.  He has significant tremor.  Tremor got worse with discontinuation of amantadine.  I am not sure that he would be a DBS candidate, but we talked about this.  I think that other medical problems and memory issues may limit his candidacy, but if they would like to explore this, we could do neuropsych testing.  In the end, he stated that he did not want to explore DBS.  I talked to him in detail about duopa.  We have talked about this before.  We talked about the benefits as well as the pitfalls of this therapy, but he does not think that he is interested in this either.  I do not think he is a candidate for any other tremor  drug.  He has tried many, and virtually all have had a side effect issues. I do think he has a component of levodopa resisted tremor, although levodopa does help his tremor some.  I think it would be beneficial if he tried to take his levodopa closer together, but not necessarily more pills.  He is currently taking 2 tablets 5 times per day and it may help if he tried it, instead, to take 2 tablets in the morning and then spread to rest out more evenly throughout the day.  I have suggested this in the past as well.  We also talked about focused ultrasound, although this is really more of a procedure for tremor than Parkinson's disease.  Again, he does not think that he is interested.  I offered him another opinion at a different movement center, but he feels comfortable that his options have been explored.  Much greater than 50% of this 40 minute visit was spent in counseling, as above.   2.  Mild cognitive impairment, likely from longstanding PD.  -He is really doing much better in this regard since being off of the artane.  3.  visual hallucinations.  -result of amantadine  4.  B12 deficiency.  -He is on oral supplements and his last level was 660 when rechecked. 5.  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  -He could not tolerate klonopin. 6.  I. will see him back in the next few months, sooner should new neurologic issues arise.

## 2014-01-09 ENCOUNTER — Ambulatory Visit (INDEPENDENT_AMBULATORY_CARE_PROVIDER_SITE_OTHER): Payer: Medicare Other

## 2014-01-09 DIAGNOSIS — Z23 Encounter for immunization: Secondary | ICD-10-CM | POA: Diagnosis not present

## 2014-01-22 ENCOUNTER — Encounter: Payer: Self-pay | Admitting: *Deleted

## 2014-01-30 ENCOUNTER — Encounter: Payer: Self-pay | Admitting: Cardiology

## 2014-02-14 ENCOUNTER — Telehealth: Payer: Self-pay | Admitting: Cardiology

## 2014-02-14 ENCOUNTER — Ambulatory Visit (INDEPENDENT_AMBULATORY_CARE_PROVIDER_SITE_OTHER): Payer: Medicare Other | Admitting: *Deleted

## 2014-02-14 DIAGNOSIS — R001 Bradycardia, unspecified: Secondary | ICD-10-CM

## 2014-02-14 NOTE — Telephone Encounter (Signed)
Pt wife had questions about letter she received recently. I informed her that all pt needs to do is send a remote transmission and I walked her through the process. I informed her that after MD reviews report she will receive a letter with results and her next transmission date. She verbalized understanding.

## 2014-02-14 NOTE — Progress Notes (Signed)
Remote pacemaker transmission.   

## 2014-02-25 LAB — MDC_IDC_ENUM_SESS_TYPE_REMOTE
Battery Impedance: 136 Ohm
Battery Remaining Longevity: 123 mo
Brady Statistic AP VS Percent: 1 %
Brady Statistic AS VP Percent: 39 %
Lead Channel Impedance Value: 538 Ohm
Lead Channel Pacing Threshold Amplitude: 0.875 V
Lead Channel Pacing Threshold Amplitude: 1 V
Lead Channel Pacing Threshold Pulse Width: 0.4 ms
Lead Channel Setting Pacing Amplitude: 2 V
Lead Channel Setting Pacing Amplitude: 2.5 V
Lead Channel Setting Pacing Pulse Width: 0.4 ms
Lead Channel Setting Sensing Sensitivity: 2.8 mV
MDC IDC MSMT BATTERY VOLTAGE: 2.8 V
MDC IDC MSMT LEADCHNL RA IMPEDANCE VALUE: 521 Ohm
MDC IDC MSMT LEADCHNL RA PACING THRESHOLD PULSEWIDTH: 0.4 ms
MDC IDC MSMT LEADCHNL RA SENSING INTR AMPL: 1.4 mV
MDC IDC SESS DTM: 20151029114923
MDC IDC STAT BRADY AP VP PERCENT: 56 %
MDC IDC STAT BRADY AS VS PERCENT: 4 %

## 2014-03-05 DIAGNOSIS — H16213 Exposure keratoconjunctivitis, bilateral: Secondary | ICD-10-CM | POA: Diagnosis not present

## 2014-03-05 DIAGNOSIS — H538 Other visual disturbances: Secondary | ICD-10-CM | POA: Diagnosis not present

## 2014-03-05 DIAGNOSIS — Z9841 Cataract extraction status, right eye: Secondary | ICD-10-CM | POA: Diagnosis not present

## 2014-03-05 DIAGNOSIS — Z9842 Cataract extraction status, left eye: Secondary | ICD-10-CM | POA: Diagnosis not present

## 2014-03-08 ENCOUNTER — Encounter: Payer: Self-pay | Admitting: Cardiology

## 2014-03-13 ENCOUNTER — Encounter: Payer: Self-pay | Admitting: Cardiovascular Disease

## 2014-03-28 ENCOUNTER — Telehealth: Payer: Self-pay | Admitting: Cardiovascular Disease

## 2014-03-28 ENCOUNTER — Encounter (HOSPITAL_COMMUNITY): Payer: Self-pay | Admitting: Internal Medicine

## 2014-04-01 NOTE — Telephone Encounter (Signed)
Closed encounter °

## 2014-04-15 ENCOUNTER — Telehealth: Payer: Self-pay | Admitting: Cardiology

## 2014-04-15 NOTE — Telephone Encounter (Signed)
Closed encounter °

## 2014-04-18 ENCOUNTER — Encounter: Payer: Medicare Other | Admitting: Internal Medicine

## 2014-04-23 ENCOUNTER — Ambulatory Visit (INDEPENDENT_AMBULATORY_CARE_PROVIDER_SITE_OTHER): Payer: Medicare Other | Admitting: Internal Medicine

## 2014-04-23 ENCOUNTER — Encounter: Payer: Self-pay | Admitting: Internal Medicine

## 2014-04-23 VITALS — BP 110/70 | HR 68 | Temp 98.6°F | Ht 67.0 in | Wt 149.0 lb

## 2014-04-23 DIAGNOSIS — Z0000 Encounter for general adult medical examination without abnormal findings: Secondary | ICD-10-CM

## 2014-04-23 DIAGNOSIS — I2510 Atherosclerotic heart disease of native coronary artery without angina pectoris: Secondary | ICD-10-CM | POA: Diagnosis not present

## 2014-04-23 DIAGNOSIS — C884 Extranodal marginal zone B-cell lymphoma of mucosa-associated lymphoid tissue [MALT-lymphoma]: Secondary | ICD-10-CM | POA: Diagnosis not present

## 2014-04-23 DIAGNOSIS — G20 Parkinson's disease: Secondary | ICD-10-CM | POA: Diagnosis not present

## 2014-04-23 DIAGNOSIS — I10 Essential (primary) hypertension: Secondary | ICD-10-CM | POA: Diagnosis not present

## 2014-04-23 DIAGNOSIS — Z23 Encounter for immunization: Secondary | ICD-10-CM | POA: Diagnosis not present

## 2014-04-23 DIAGNOSIS — Z7189 Other specified counseling: Secondary | ICD-10-CM | POA: Diagnosis not present

## 2014-04-23 NOTE — Assessment & Plan Note (Signed)
Doing well Keeps up with cardiology

## 2014-04-23 NOTE — Assessment & Plan Note (Signed)
Ongoing functional decline but modest Mostly still independent Frustrated by limitations though

## 2014-04-23 NOTE — Addendum Note (Signed)
Addended by: Despina Hidden on: 04/23/2014 12:24 PM   Modules accepted: Orders

## 2014-04-23 NOTE — Assessment & Plan Note (Signed)
Sees Dr Watt Climes No evidence of recurrence

## 2014-04-23 NOTE — Assessment & Plan Note (Signed)
See social history °Has DNR °

## 2014-04-23 NOTE — Assessment & Plan Note (Signed)
I have personally reviewed the Medicare Annual Wellness questionnaire and have noted 1. The patient's medical and social history 2. Their use of alcohol, tobacco or illicit drugs 3. Their current medications and supplements 4. The patient's functional ability including ADL's, fall risks, home safety risks and hearing or visual             impairment. 5. Diet and physical activities 6. Evidence for depression or mood disorders  The patients weight, height, BMI and visual acuity have been recorded in the chart I have made referrals, counseling and provided education to the patient based review of the above and I have provided the pt with a written personalized care plan for preventive services.  I have provided you with a copy of your personalized plan for preventive services. Please take the time to review along with your updated medication list.  Will give prevnar He prefers no zostavax due to cost No cancer screening due to age

## 2014-04-23 NOTE — Assessment & Plan Note (Signed)
BP Readings from Last 3 Encounters:  04/23/14 110/70  01/07/14 142/82  11/12/13 112/62   Low now No meds except lasix for edema

## 2014-04-23 NOTE — Progress Notes (Signed)
Pre visit review using our clinic review tool, if applicable. No additional management support is needed unless otherwise documented below in the visit note. 

## 2014-04-23 NOTE — Progress Notes (Signed)
Subjective:    Patient ID: Ian [name], male    DOB: 1928/11/03, 81 y.o.   MRN: [id number]  HPI Here for Medicare wellness and follow up of Parkinson's and other chronic medical conditions Here with wife Reviewed his form and advanced directives No falls in past year!! No tobacco or alcohol Tries to exercise reguarly Trouble with vision--- can't read newspaper Mild hearing loss Some mood issues related to his Parkinson's and limitations. Not really anhedonic but has had to give things up Wife has to do most instrumental ADLs--he does personal care Did give up driving some time ago Still with memory problems--but may still be better than when he was on artane  Ongoing problems with Parkinson's Tremor worse off amantadine--- but no more hallucinations Still does all personal care---wife handles instrumental ADLs Some trouble eating--- still using his own utensils but wife cuts meat  Has cardiology follow up next week No chest pain No palpitations No syncope. Occasional mild dizziness.  Does have balance issues still---doesn't use cane Leg swelling is better --- takes the furosemide daily though No problems with SOB  Will get occasional gas---will belch No regular heartburn Uses prevacid twice a week or so Did have recheck with Dr Watt Climes recently--no evidence of recurrence of MALT  Current Outpatient Prescriptions on File Prior to Visit  Medication Sig Dispense Refill  . aspirin 81 MG tablet Take 81 mg by mouth daily.    . bisoprolol-hydrochlorothiazide (ZIAC) 5-6.25 MG per tablet Take 1 tablet by mouth daily. 90 tablet 3  . carbidopa-levodopa (SINEMET IR) 25-100 MG per tablet Take 2 tablets by mouth 5 (five) times daily.    . furosemide (LASIX) 40 MG tablet Take 1 tablet (40 mg total) by mouth daily. 90 tablet 3  . polyethylene glycol (MIRALAX / GLYCOLAX) packet Take 17 g by mouth daily.    . potassium chloride (K-DUR) 10 MEQ tablet Take 1 tablet (10 mEq total) by mouth  2 (two) times daily. 180 tablet 3  . vitamin B-12 (CYANOCOBALAMIN) 1000 MCG tablet Take 1,000 mcg by mouth daily.     No current facility-administered medications on file prior to visit.    Allergies  Allergen Reactions  . Plavix [Clopidogrel]     Fatigue   . Pravastatin   . Zocor [Simvastatin]     REACTION: dizziness  . Penicillins Rash    Past Medical History  Diagnosis Date  . CAD (coronary artery disease) CABG x1 2001; PCI 2008    s/p LIMA-LAD in2001 with AVR; BMS to LCx 8/08  . Allergy   . Diverticulitis   . GERD (gastroesophageal reflux disease)   . Hypertension   . Osteoarthritis   . Osteoporosis   . MALT lymphoma   . Parkinson's disease   . ASCVD (arteriosclerotic cardiovascular disease) 06/08    TIA  . Hyperlipidemia   . ED (erectile dysfunction)   . Aortic valve stenosis, acquired 2001    s/p Porcine AVR; by 05/2009 Echo - EF 45-50%, moderate AS -- AVA 0.98 cm2, peak gradient 18mmHg  . Right renal artery stenosis 2012    90% stenosis - stable  . S/P cardiac pacemaker procedure, Medtronic Adapta L  ADDRL1, 01/21/12 01/22/2012    Past Surgical History  Procedure Laterality Date  . Coronary artery bypass graft  2001    Lucianne Lei trigt ) aortic valve replacement 2001; LIMA-LAD.  Marland Kitchen Esophagogastroduodenoscopy      multiple, Colon/ EGD benign 11/2004  . Pilonidal cyst / sinus excision  1953  . Inguinal hernia repair      LIH 1997Llap. bilateral hernias 1998  . Coronary stent placement  11/2007    8/09  Left Circumflex Stent by Dr Toy Care started and aggrenox stopped  . Cataract extraction  06/09    left  . Laparoscopic cholecystectomy  07/11    Dr.Rosenbower  . Squamous cell carcinoma excision  3/13    back  . Aortic valve replacement  2001    Porcine valve  . Pacemaker insertion  01/21/2012    Medtronic  . Nm myocar perf wall motion  11/22/2007    mild septal ischemia,EF 66%  . Permanent pacemaker insertion N/A 01/21/2012    Procedure: PERMANENT  PACEMAKER INSERTION;  Surgeon: Thompson Grayer, MD;  Location: [facility] CATH LAB;  Service: Cardiovascular;  Laterality: N/A;    Family History  Problem Relation Age of Onset  . Heart disease Brother   . Asthma Father     History   Social History  . Marital Status: Married    Spouse Name: N/A    Number of Children: 3  . Years of Education: N/A   Occupational History  . retired- [employer]   . part-time sub/courier for schools- now only rarely    Social History Main Topics  . Smoking status: Former Smoker -- 1.00 packs/day for 5 years    Types: Cigarettes    Quit date: 06/06/1963  . Smokeless tobacco: Never Used  . Alcohol Use: No     Comment: very rare  . Drug Use: No  . Sexual Activity: No   Other Topics Concern  . Not on file   Social History Narrative   Has living will   Wife, then son [name], hold health care POA   Requests DNR --done   Requests no tube feeds if cognitively unaware   Review of Systems Reviewed his cholesterol testing--- LDL 93 without meds. Doesn't have high cholesterol Sleep is not great--up twice for nocturia and will have trouble reinitiating. Occasional daytime somnolence Bowels good with the miralax No rash or suspicious lesions. Does see the dermatologist--nothing worrisome    Objective:   Physical Exam  Constitutional: He is oriented to person, place, and time. He appears well-developed and well-nourished. No distress.  HENT:  Mouth/Throat: Oropharynx is clear and moist. No oropharyngeal exudate.  Neck: Normal range of motion. Neck supple. No thyromegaly present.  Cardiovascular: Normal rate and regular rhythm.  Exam reveals no gallop.   Foot pulses scant to absent Gr 2/6 coarse aortic systolic murmur  Pulmonary/Chest: Effort normal and breath sounds normal. No respiratory distress. He has no wheezes. He has no rales.  Abdominal: Soft. There is no tenderness.  Musculoskeletal: He exhibits no edema or tenderness.  Lymphadenopathy:     He has no cervical adenopathy.  Neurological: He is alert and oriented to person, place, and time.  President-- "Abbe [place], [place]" [phone] [id number] Recall 3/3  Moderate tremor in hands Normal tone  Skin: No rash noted. No erythema.  Psychiatric: He has a normal mood and affect. His behavior is normal.          Assessment & Plan:

## 2014-04-24 ENCOUNTER — Telehealth: Payer: Self-pay | Admitting: Internal Medicine

## 2014-04-24 NOTE — Telephone Encounter (Signed)
emmi mailed  °

## 2014-04-29 ENCOUNTER — Ambulatory Visit: Payer: Medicare Other | Admitting: Cardiology

## 2014-04-30 ENCOUNTER — Ambulatory Visit: Payer: Medicare Other | Admitting: Cardiology

## 2014-05-06 ENCOUNTER — Ambulatory Visit (INDEPENDENT_AMBULATORY_CARE_PROVIDER_SITE_OTHER): Payer: Medicare Other | Admitting: Neurology

## 2014-05-06 ENCOUNTER — Encounter: Payer: Self-pay | Admitting: Neurology

## 2014-05-06 VITALS — BP 128/68 | HR 84 | Ht 68.0 in | Wt 151.0 lb

## 2014-05-06 DIAGNOSIS — I2510 Atherosclerotic heart disease of native coronary artery without angina pectoris: Secondary | ICD-10-CM

## 2014-05-06 DIAGNOSIS — G3184 Mild cognitive impairment, so stated: Secondary | ICD-10-CM

## 2014-05-06 DIAGNOSIS — G20 Parkinson's disease: Secondary | ICD-10-CM

## 2014-05-06 MED ORDER — CARBIDOPA-LEVODOPA ER 50-200 MG PO TBCR: 1.0000 | EXTENDED_RELEASE_TABLET | Freq: Every day | ORAL | Status: DC

## 2014-05-06 NOTE — Patient Instructions (Addendum)
1. Start Levodopa 50/200 at bedtime. Prescription sent to your pharmacy.

## 2014-05-06 NOTE — Progress Notes (Signed)
Ian [name] was seen today in the movement disorders clinic for f/u.  He is accompanied by his wife who supplements the history.  The first symptom(s) the patient noticed was tremor in the R hand when he would use the hand or have a cup of coffee in it.  His wife believes that this began in 2004.  He was referred to Dr. Tilden Dome.  He tried some medication and according to his wife, he kept having adverse SE.  He was on mirapex and sinemet but reported dizziness and bad dreams with both.   He was referred to Dr. Yevonne Pax in about 2010 because he was interested in DBS but the patient was concerned about risks of the surgery and potential side effects.    09/28/12  I tried to change the levodopa back to the IR formulation but he did not like it because he was too drowsy and went back to the CR formulation.  , 2 po qid and carbidopa/levodopa 50/200 at night.    He has continued to have more tremor after the artane was d/c.  However, this has become less bothersome for him over time.  He does notice that sometimes the medication wears off quickly and sometimes it does not.  It seems inconsistent.  03/06/13 update:  The patient is accompanied by his wife who supplements the history.  Last visit, we tried to add entacapone to each of the 4 carbidopa/levodopa dosing times.  He called me because of nausea and we decreased it to twice a day dosing.  He continued to have nausea and the medication was discontinued.  The patient states that the medication makes his arms feel "heavy." He has stopped the carbidopa/levodopa 50/200 at night as he does not think it was beneficial.  Sometimes, he will wake up in the middle of the night and take an extra of the carbidopa/levodopa CR 25 100.  He and his wife have noted some jerking spells at night.  He sleeps very restless. I did review notes in his chart since last visit.  He went to the emergency room on September 21 with a sensation of palpitations and tachycardia.  His  pacemaker was interrogated and there were no problems and he was subsequently sent home.  06/11/13 update:  This patient is accompanied in the office by his spouse who supplements the history.  Pt is on carbidopa/levodopa 25/100 CR - 2 in the AM and 6 other dosages throughout the day.  When he awakens in the middle of the night he may take a few extra dosages and he estimates he takes a total daily levodopa dose of 1000-1200 mg per day.  He has been very senstive to medication and has not been able to tolerate the immediate release carbidopa/levodopa, entacapone or klonopin (made arms/legs hurt).  Tremor feels like it is getting worse.  Vision is getting worse but he went to the eye doctor and his vision was stable.  He feels like the levodopa only lasts 1.5-2 hrs.  No falls.  Is exercising.    08/13/13 update:   This patient is accompanied in the office by his spouse who supplements the history.  Pt is on carbidopa/levodopa 25/100, and takes approximately 8-10 tablets per day.  Last visit, I also added amantadine to see if that would help the tremor. He does think that has helped.  He presents today to try and change to rytary to see if it would last longer.  He does not wish to  try duopa.  He denies hallucinations, lightheadedness, falls, syncope or dyskinesia.     10/01/13 update:  Last visit, I changed the patient to rytary.  It turned out to be very expensive as the patient had no RX coverage, which I did not realize.  When he changed back, the IR formulation was accidentally called in instead of the CR prepartation (and he didn't tolerate the IR in the past).  The patient also didn't notice that and he isn't even sure now what he is taking but knows he is taking 2 po qid of either the CR or IR.  He is having hallucinations.  He is having swelling in the feet and legs as well.   They saw cardiology last week and were started on lasix and they didn't think that it made a difference.  They are worried  about a discoloration in legs.  He has lost weight.  Had one fall on May 10.  Walking into bedroom, got lightheaded and hit his back on the dresser.  He didn't think that he hit his head at the time but now that they think that he did as he has had hallucinations since.  However, looking back at our notes, it was 5/7 when he called here to d/c the rytary and changed to the IR levodopa it was 5/10 when he fell so timing was likely just coincidental as he had likely just changed back to the IR preparation.  11/12/13 update:  The patient was seen back in followup in the neurology clinic, accompanied by his wife who helps to supplement the history.  Patient has a history of Parkinson's disease.  Last visit, I changed his levodopa back to the CR preparation to see if that would help his hallucinations.  He is currently on 2 tablets by mouth 4 times per day (8am/12/4pm/8pm).  Today, the patient states that hallucinations continue.  He states that he continues to see people.   Medication helps tremor when he first takes it and then the tremor comes back before the next dose.   Last visit, I told him to discontinue his amantadine because of complaints of swelling.  I did review records since our last visit.  Because he noticed no changes in swelling after discontinuation of the amantadine, he followed up with his primary care physician.  His primary care physician felt that the swelling was likely due to venous insufficiency and he recommended that the patient restart the amantadine and he has.  The patient does state that he doesn't think that he had any break between d/c the IR carbidopa/levodopa 25/100, starting the CR version and when he restarted the amantadine.   The patient did see his cardiologist and he saw no cardiac reason for the edema.  He did not want to increase the patient's Lasix further because of complaints of orthostasis and dizziness.  No significant etiology was determined for his weight loss  either.  01/07/14 update:  The patient was seen today in followup in the neurology clinic, accompanied by his wife who helps to supplement the history.  The patient has switched back on his own from the CR version of carbidopa/levodopa to the immediate release form.  He is on 2 po 5 times per day (8am and then every 3 hours).  He is complaining about more tremor and more balance issues.  No falls but near falls.  He is off of amantadine.  Hallucinations went away after it was d/c but tremor definitely picked up.  Leg swelling is better but not gone.  Some memory problems.  No driving since march, 2015.    05/06/14 update:  The patient is seen in f/u, accompanied by his wife who supplements the history.   He is on carbidopa/levodopa 25/100 CR, 2 po 5 times per day (8am and then every 3 hours). He continues to c/o increasing tremor.  States that he is having m. Cramping at nighttime.  His last dose is at 8pm and bedtime is at 10 pm and cramping will start waking him at 3:30 am. Asks about trying to take a combination of one CR and one IR and see how that works.   He states that his memory is "going downhill" although his wife is not quite as sure.  He doesn't drive and hasn't for a year but has not trouble with pills.    Neuroimaging has previously been performed.  It is not available for my review today.  PREVIOUS MEDICATIONS: Sinemet (reported bad dreams/dizziness on regular formulation and same with Mirapex); requip;  on Sinemet CR currently, entacapone (felt dizzy and arms felt "heavy"); klonopin (made arms/legs hurt); artane; amantadine (hallucinations); rytary (costly and pt has no RX coverage)  ALLERGIES:   Allergies  Allergen Reactions  . Plavix [Clopidogrel]     Fatigue   . Pravastatin   . Zocor [Simvastatin]     REACTION: dizziness  . Penicillins Rash    CURRENT MEDICATIONS:  Current Outpatient Prescriptions on File Prior to Visit  Medication Sig Dispense Refill  . aspirin 81 MG tablet  Take 81 mg by mouth daily.    . bisoprolol-hydrochlorothiazide (ZIAC) 5-6.25 MG per tablet Take 1 tablet by mouth daily. 90 tablet 3  . carbidopa-levodopa (SINEMET IR) 25-100 MG per tablet Take 2 tablets by mouth 5 (five) times daily.    . furosemide (LASIX) 40 MG tablet Take 1 tablet (40 mg total) by mouth daily. 90 tablet 3  . lansoprazole (PREVACID) 15 MG capsule Take 15 mg by mouth daily as needed.    . polyethylene glycol (MIRALAX / GLYCOLAX) packet Take 17 g by mouth daily.    . potassium chloride (K-DUR) 10 MEQ tablet Take 1 tablet (10 mEq total) by mouth 2 (two) times daily. 180 tablet 3  . vitamin B-12 (CYANOCOBALAMIN) 1000 MCG tablet Take 1,000 mcg by mouth daily.     No current facility-administered medications on file prior to visit.    PAST MEDICAL HISTORY:   Past Medical History  Diagnosis Date  . CAD (coronary artery disease) CABG x1 2001; PCI 2008    s/p LIMA-LAD in2001 with AVR; BMS to LCx 8/08  . Allergy   . Diverticulitis   . GERD (gastroesophageal reflux disease)   . Hypertension   . Osteoarthritis   . Osteoporosis   . MALT lymphoma   . Parkinson's disease   . ASCVD (arteriosclerotic cardiovascular disease) 06/08    TIA  . Hyperlipidemia   . ED (erectile dysfunction)   . Aortic valve stenosis, acquired 2001    s/p Porcine AVR; by 05/2009 Echo - EF 45-50%, moderate AS -- AVA 0.98 cm2, peak gradient 86mmHg  . Right renal artery stenosis 2012    90% stenosis - stable  . S/P cardiac pacemaker procedure, Medtronic Adapta L  ADDRL1, 01/21/12 01/22/2012    PAST SURGICAL HISTORY:   Past Surgical History  Procedure Laterality Date  . Coronary artery bypass graft  2001    Lucianne Lei trigt ) aortic valve replacement 2001; LIMA-LAD.  Marland Kitchen  Esophagogastroduodenoscopy      multiple, Colon/ EGD benign 11/2004  . Pilonidal cyst / sinus excision  1953  . Inguinal hernia repair      LIH 1997Llap. bilateral hernias 1998  . Coronary stent placement  11/2007    8/09  Left Circumflex  Stent by Dr Toy Care started and aggrenox stopped  . Cataract extraction  06/09    left  . Laparoscopic cholecystectomy  07/11    Dr.Rosenbower  . Squamous cell carcinoma excision  3/13    back  . Aortic valve replacement  2001    Porcine valve  . Pacemaker insertion  01/21/2012    Medtronic  . Nm myocar perf wall motion  11/22/2007    mild septal ischemia,EF 66%  . Permanent pacemaker insertion N/A 01/21/2012    Procedure: PERMANENT PACEMAKER INSERTION;  Surgeon: Thompson Grayer, MD;  Location: [facility] CATH LAB;  Service: Cardiovascular;  Laterality: N/A;    SOCIAL HISTORY:   History   Social History  . Marital Status: Married    Spouse Name: N/A    Number of Children: 3  . Years of Education: N/A   Occupational History  . retired- [employer]   . part-time sub/courier for schools- now only rarely    Social History Main Topics  . Smoking status: Former Smoker -- 1.00 packs/day for 5 years    Types: Cigarettes    Quit date: 06/06/1963  . Smokeless tobacco: Never Used  . Alcohol Use: No     Comment: very rare  . Drug Use: No  . Sexual Activity: No   Other Topics Concern  . Not on file   Social History Narrative   Has living will   Wife, then son [name], hold health care POA   Requests DNR --done   Requests no tube feeds if cognitively unaware    FAMILY HISTORY:   Family Status  Relation Status Death Age  . Mother Deceased 40    old age  . Father Deceased 60    acute bronchitis, cotton exposure  . Sister Deceased     trauma  . Brother Deceased     2, deceased  . Daughter Deceased 59    osteosarcoma  . Child Alive     3, healthy  . Brother Alive     healthy  . Sister Alive     healthy    ROS:    A complete 10 system review of systems was obtained and was unremarkable apart from what is mentioned above.  PHYSICAL EXAMINATION:    VITALS:   Filed Vitals:   05/06/14 1402  BP: 128/68  Pulse: 84  Height: 5\' 8"  (1.727 m)  Weight: 151 lb  (68.493 kg)   Wt Readings from Last 3 Encounters:  05/06/14 151 lb (68.493 kg)  04/23/14 149 lb (67.586 kg)  01/07/14 145 lb (65.772 kg)     GEN:  The patient appears stated age and is in NAD. HEENT:  Normocephalic, atraumatic.  The mucous membranes are moist. The superficial temporal arteries are without ropiness or tenderness. CV:  RRR Lungs:  CTAB Neck/HEME:  There are no carotid bruits bilaterally.  Neurological examination:  Orientation: The patient is alert and oriented x3.  He is a very good historian today.  Movement examination: Tone: There is normal tone in the bilateral upper extremities today.  The tone in the lower extremities is normal.  Abnormal movements: There is a moderate RUE resting tremor bilaterally.  No dyskinesia. Coordination:  There is miminal decremation with RAM's, seen mostly with hand opening and closing on the R Gait and Station: The patient has mild difficulty arising out of a deep-seated chair without the use of the hands. The patient's stride length is normal.  He has postural instability.    Lab Results  Component Value Date   WBC 8.5 01/07/2013   HGB 14.0 01/07/2013   HCT 41.2 01/07/2013   MCV 92.0 01/07/2013   PLT 250 01/07/2013     Chemistry      Component Value Date/Time   NA 139 09/05/2013 1329   K 4.4 09/05/2013 1329   CL 100 09/05/2013 1329   CO2 31 09/05/2013 1329   BUN 15 09/05/2013 1329   CREATININE 0.89 09/05/2013 1329   CREATININE 0.73 01/07/2013 0653      Component Value Date/Time   CALCIUM 10.1 09/05/2013 1329   ALKPHOS 116 09/05/2013 1329   AST 15 09/05/2013 1329   ALT 10 09/05/2013 1329   BILITOT 0.8 09/05/2013 1329     Lab Results  Component Value Date   VITAMINB12 660 03/06/2013   Lab Results  Component Value Date   TSH 2.10 03/29/2012     ASSESSMENT/PLAN:  1.  Idiopathic Parkinsons disease.    -We discussed the diagnosis as well as pathophysiology of the disease.  We discussed treatment options as  well as prognostic indicators.  Patient education was provided.  -His hallucinations went away with discontinuation of amantadine.  -I. had a long discussion with the patient and wife about various options, which I think are somewhat limited.  He has significant tremor.  Tremor got worse with discontinuation of amantadine.  I am not sure that he would be a DBS candidate, but we talked about this.  I think that other medical problems and memory issues may limit his candidacy, but if they would like to explore this, we could do neuropsych testing.  In the end, he stated that he did not want to explore DBS.  I talked to him in detail about duopa.  We have talked about this before.  We talked about the benefits as well as the pitfalls of this therapy, but he does not think that he is interested in this either.  I do think that he is going to be left with this tremor and there is very little that we can do about it now, without affecting his cognition.  He understands this.  -He would like to try a combination of the carbidopa levodopa 25/100 IR and carbidopa/levodopa 25/100 CR so that he takes one tablet of each (therefore 2 tablets) 5 times a day.  I have no objection to him trying this.  I'm not sure it will make a huge difference.  We have tried multiple accommodations with him in the past, including Rytary without a big difference.  -Add carbidopa/levodopa 50/200 at night for muscle cramping.  We have tried this in the past for other things and he stopped it as it did not make a difference, but he never had this muscle cramping.  I think it will help the cramping. 2.  Mild cognitive impairment, likely from longstanding PD.  -I think this is from long-standing Parkinson's disease.  I also think that he is not getting a good night rest from the muscle cramping.  Hopefully the addition of the CR at bedtime will help.  3.  visual hallucinations.  -result of amantadine and resolved after the discontinuation. 4.   B12 deficiency.  -  He is on oral supplements and his last level was 660 when rechecked. 5.  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  -He could not tolerate klonopin. 6.  I. will see him back in the next few months, sooner should new neurologic issues arise.

## 2014-05-14 ENCOUNTER — Telehealth: Payer: Self-pay | Admitting: Cardiology

## 2014-05-14 NOTE — Telephone Encounter (Signed)
Spoke w/ pt and confirmed if pt was coming to office on 05-17-14 to upgrade home monitor w/ Medtronic.

## 2014-05-17 ENCOUNTER — Encounter: Payer: Self-pay | Admitting: Cardiology

## 2014-05-17 ENCOUNTER — Ambulatory Visit (INDEPENDENT_AMBULATORY_CARE_PROVIDER_SITE_OTHER): Payer: Medicare Other | Admitting: Cardiology

## 2014-05-17 VITALS — BP 124/60 | HR 85 | Ht 67.0 in | Wt 146.6 lb

## 2014-05-17 DIAGNOSIS — I2510 Atherosclerotic heart disease of native coronary artery without angina pectoris: Secondary | ICD-10-CM

## 2014-05-17 DIAGNOSIS — I359 Nonrheumatic aortic valve disorder, unspecified: Secondary | ICD-10-CM | POA: Diagnosis not present

## 2014-05-17 DIAGNOSIS — I10 Essential (primary) hypertension: Secondary | ICD-10-CM | POA: Diagnosis not present

## 2014-05-17 MED ORDER — SILDENAFIL CITRATE 50 MG PO TABS: 25.0000 mg | ORAL_TABLET | ORAL | Status: DC | PRN

## 2014-05-17 NOTE — Patient Instructions (Signed)
Your physician has requested that you have an echocardiogram. Echocardiography is a painless test that uses sound waves to create images of your heart. It provides your doctor with information about the size and shape of your heart and how well your heart's chambers and valves are working. This procedure takes approximately one hour. There are no restrictions for this procedure.  Your physician wants you to follow-up in: 6 months or sooner with Dr. Gwenlyn Found. You will receive a reminder letter in the mail two months in advance. If you don't receive a letter, please call our office to schedule the follow-up appointment.

## 2014-05-17 NOTE — Progress Notes (Signed)
05/17/2014 Ian [name]   1929/01/02  [id number]  Primary Physician: Ian Simpler, MD Primary Cardiologist: Ian [name]  Reason for Visit/ CC: 6 month follow-up for CAD and aortic stenosis  HPI: Ian [name] is a 81 y/o male who presents to clinic today for routine 6 months f/u. He is accompanied by his wife. His cardiovascular history is significant for  coronary artery disease status post bypass grafting in 2001 with a LIMA to his LAD and a porcine aortic valve replacement at that time due to severe AS. He did have a bare metal stent placed to his circumflex coronary artery by Ian [name] on December 05, 2007. At that time, his LIMA was patent and his RCA had only mild disease. Abdominal aortography revealed a widely patent left renal artery with a 90% right renal artery stenosis.This has been followed yearly by duplex ultrasound that has remained stable. His last Myoview performed in 2009 was nonischemic. Last echo performed in 02/2013 which revealed an aortic valve area of 1.28 cm squared with an EF of 50-55% and mild concentric LVH. His other problems include hypertension and hyperlipidemia. His lipid profile is followed by his PCP. He does have progressive Parkinson's disease. In 2013 he was significantly bradycardic and underwent permanent transvenous pacemaker insertion which is currently followed by Ian [name].   He reports that he has done well since his last OV. He denies CP, dizziness, lightheadedness, palpations, syncope/ near syncope and claudication. He does note mild DOE and decreased exercise tolerance, but denies dyspnea at rest. No orthopnea/ PND. He has been fully compliant with medications and adherent to a low sodium diet. He also notes ED and is requesting Viagra.     Current Outpatient Prescriptions  Medication Sig Dispense Refill  . aspirin 81 MG tablet Take 81 mg by mouth daily.    . bisoprolol-hydrochlorothiazide (ZIAC) 5-6.25 MG per tablet Take 1 tablet by mouth  daily. 90 tablet 3  . carbidopa-levodopa (SINEMET CR) 50-200 MG per tablet Take 1 tablet by mouth at bedtime. 90 tablet 1  . carbidopa-levodopa (SINEMET IR) 25-100 MG per tablet Take 2 tablets by mouth 5 (five) times daily.    . furosemide (LASIX) 40 MG tablet Take 1 tablet (40 mg total) by mouth daily. 90 tablet 3  . lansoprazole (PREVACID) 15 MG capsule Take 15 mg by mouth daily as needed.    . polyethylene glycol (MIRALAX / GLYCOLAX) packet Take 17 g by mouth daily.    . potassium chloride (K-DUR) 10 MEQ tablet Take 1 tablet (10 mEq total) by mouth 2 (two) times daily. 180 tablet 3  . vitamin B-12 (CYANOCOBALAMIN) 1000 MCG tablet Take 1,000 mcg by mouth daily.     No current facility-administered medications for this visit.    Allergies  Allergen Reactions  . Plavix [Clopidogrel]     Fatigue   . Pravastatin   . Zocor [Simvastatin]     REACTION: dizziness  . Penicillins Rash    History   Social History  . Marital Status: Married    Spouse Name: N/A    Number of Children: 3  . Years of Education: N/A   Occupational History  . retired- [employer]   . part-time sub/courier for schools- now only rarely    Social History Main Topics  . Smoking status: Former Smoker -- 1.00 packs/day for 5 years    Types: Cigarettes    Quit date: 06/06/1963  . Smokeless tobacco: Never Used  .  Alcohol Use: No     Comment: very rare  . Drug Use: No  . Sexual Activity: No   Other Topics Concern  . Not on file   Social History Narrative   Has living will   Wife, then son Ian [name], hold health care POA   Requests DNR --done   Requests no tube feeds if cognitively unaware     Review of Systems: General: negative for chills, fever, night sweats or weight changes.  Cardiovascular: negative for chest pain, dyspnea on exertion, edema, orthopnea, palpitations, paroxysmal nocturnal dyspnea or shortness of breath Dermatological: negative for rash Respiratory: negative for cough or  wheezing Urologic: negative for hematuria Abdominal: negative for nausea, vomiting, diarrhea, bright red blood per rectum, melena, or hematemesis Neurologic: negative for visual changes, syncope, or dizziness All other systems reviewed and are otherwise negative except as noted above.    Blood pressure 124/60, pulse 85, height 5\' 7"  (1.702 m), weight 146 lb 9.6 oz (66.497 kg).  General appearance: alert, cooperative and no distress Neck: no carotid bruit and no JVD Lungs: clear to auscultation bilaterally Heart: regular rate and rhythm and 3/6 SM Extremities: no LEE Pulses: warm and dry Skin: warm and dry Neurologic: Grossly normal  EKG was ordered and demonstrates ventricular paced rhythm w/ frequent PVCs. 85 bpm   ASSESSMENT AND PLAN:   1. CAD: s/p CABG in 2001. Stable. Denies angina. EKG w/o ischemic abnormalities. Continue medical therapy. ASA + BB.  2. Aortic Valve Disease: s/p tissue valve replacement in 2001. He notes decreased exercise tolerance and DOE. Last echo was in 2014 demonstrating an aortic valve area of 1.2 cm squared. Will repeat echo to reassess valve area.   3. RAS: followed yearly. Stable on last evaluation with 60-99% right stenosis and 1-59% on left (unchanged from prior). Due for next ultrasound in 6 months.   4. PPM: followed by Ian [name].   5. HTN: well controlled at 124/60. Continue current medical regimen.  6. HLD: continue statin therapy. Lipid profile followed by PCP.  7. ED: I have reviewed medications. He is not on long acting nitrate therapy. Will Rx Viagra, 25 mg PRN. Discussed importance to avoid use of Viagra and SL NTG within 36 hrs apart to prevent unsafe drop in BP. Patient verbalized understanding.    PLAN  Continue current meds as outlined above. F/u with Ian [name] in 6 months.   Ian [name], [id number] 05/17/2014 11:44 AM

## 2014-05-21 ENCOUNTER — Telehealth: Payer: Self-pay | Admitting: Cardiovascular Disease

## 2014-05-21 ENCOUNTER — Ambulatory Visit (INDEPENDENT_AMBULATORY_CARE_PROVIDER_SITE_OTHER): Payer: Medicare Other | Admitting: *Deleted

## 2014-05-21 ENCOUNTER — Telehealth: Payer: Self-pay | Admitting: Cardiology

## 2014-05-21 LAB — MDC_IDC_ENUM_SESS_TYPE_REMOTE
Battery Remaining Longevity: 124 mo
Brady Statistic AP VP Percent: 43 %
Brady Statistic AP VS Percent: 1 %
Brady Statistic AS VS Percent: 5 %
Date Time Interrogation Session: 20160205153733
Lead Channel Impedance Value: 555 Ohm
Lead Channel Pacing Threshold Amplitude: 1.25 V
Lead Channel Pacing Threshold Pulse Width: 0.4 ms
Lead Channel Pacing Threshold Pulse Width: 0.4 ms
Lead Channel Sensing Intrinsic Amplitude: 2.8 mV
Lead Channel Setting Pacing Amplitude: 2 V
Lead Channel Setting Pacing Amplitude: 2.5 V
Lead Channel Setting Sensing Sensitivity: 2.8 mV
MDC IDC MSMT BATTERY IMPEDANCE: 136 Ohm
MDC IDC MSMT BATTERY VOLTAGE: 2.79 V
MDC IDC MSMT LEADCHNL RA IMPEDANCE VALUE: 499 Ohm
MDC IDC MSMT LEADCHNL RA PACING THRESHOLD AMPLITUDE: 0.75 V
MDC IDC SET LEADCHNL RV PACING PULSEWIDTH: 0.4 ms
MDC IDC STAT BRADY AS VP PERCENT: 51 %

## 2014-05-21 NOTE — Telephone Encounter (Signed)
New Msg       Pt wife calling, states they are trying to complete transmission but they are getting no lights and maybe a busy signal   Please return call after 11:30 if possible at [phone] which is a cell phn.

## 2014-05-21 NOTE — Telephone Encounter (Signed)
Attempted to help pt send transmission. Informed pt wife that since transmission would not send to wait and send transmission once wirex is received. She verbalized understanding.

## 2014-05-21 NOTE — Telephone Encounter (Signed)
Pt will get wife to call back to receive help w/ transmission.

## 2014-05-24 ENCOUNTER — Telehealth: Payer: Self-pay | Admitting: Cardiology

## 2014-05-24 DIAGNOSIS — R001 Bradycardia, unspecified: Secondary | ICD-10-CM

## 2014-05-24 NOTE — Progress Notes (Signed)
Remote pacemaker transmission.   

## 2014-05-24 NOTE — Telephone Encounter (Signed)
Pt walked into office needing help w/ trouble shooting monitor. After several attempts and calling tech services monitor was working when pt left office.

## 2014-05-27 ENCOUNTER — Ambulatory Visit (HOSPITAL_COMMUNITY)
Admission: RE | Admit: 2014-05-27 | Discharge: 2014-05-27 | Disposition: A | Discharge status: Home / Self Care | Payer: Medicare Other | Source: Ambulatory Visit | Attending: Cardiology | Admitting: Cardiology

## 2014-05-27 DIAGNOSIS — I359 Nonrheumatic aortic valve disorder, unspecified: Secondary | ICD-10-CM

## 2014-05-27 NOTE — Progress Notes (Signed)
2D Echo Performed 05/27/2014    Caspar Favila, RCS  

## 2014-05-28 ENCOUNTER — Telehealth: Payer: Self-pay | Admitting: Cardiovascular Disease

## 2014-05-29 NOTE — Telephone Encounter (Signed)
Closed encounter °

## 2014-05-30 ENCOUNTER — Encounter (HOSPITAL_COMMUNITY): Payer: Self-pay | Admitting: *Deleted

## 2014-05-30 ENCOUNTER — Encounter: Payer: Self-pay | Admitting: Cardiovascular Disease

## 2014-05-30 ENCOUNTER — Ambulatory Visit (INDEPENDENT_AMBULATORY_CARE_PROVIDER_SITE_OTHER): Payer: Medicare Other | Admitting: Cardiovascular Disease

## 2014-05-30 VITALS — BP 112/68 | HR 86 | Ht 68.0 in | Wt 148.1 lb

## 2014-05-30 DIAGNOSIS — I701 Atherosclerosis of renal artery: Secondary | ICD-10-CM

## 2014-05-30 DIAGNOSIS — Z950 Presence of cardiac pacemaker: Secondary | ICD-10-CM | POA: Diagnosis not present

## 2014-05-30 DIAGNOSIS — I10 Essential (primary) hypertension: Secondary | ICD-10-CM

## 2014-05-30 DIAGNOSIS — I2510 Atherosclerotic heart disease of native coronary artery without angina pectoris: Secondary | ICD-10-CM | POA: Diagnosis not present

## 2014-05-30 DIAGNOSIS — I519 Heart disease, unspecified: Secondary | ICD-10-CM | POA: Diagnosis not present

## 2014-05-30 NOTE — Assessment & Plan Note (Signed)
History of hypertension with blood pressure measured at 112/68. He is on bisoprolol hydrochlorothiazide as well as furosemide. Continue current meds at current dosing

## 2014-05-30 NOTE — Assessment & Plan Note (Signed)
History of permanent transvenous pacemaker insertion by Dr. Sallyanne Kuster October 2013 which he follows. This was put in for symptomatic bradycardia.

## 2014-05-30 NOTE — Assessment & Plan Note (Addendum)
History of coronary artery disease status post coronary artery bypass grafting in 2001 with aortic valve replacement using a bioprosthetic aortic valve. He denies chest pain but has noticed increasing dyspnea on exertion. Recent 2-D echo revealed a decline in his lethargy ejection fraction from 50% down to 20-25%. His aortic valve area has remained stable at 1.2 cm squared. I'm not sure the etiology of the decline in in his ejection fraction. . I'm going to get a primary oncologic Myoview stress test to rule out an ischemic etiology.

## 2014-05-30 NOTE — Assessment & Plan Note (Signed)
History of right renal artery stenosis followed by a duplex ultrasound which has remained stable as recently as 09/26/13.

## 2014-05-30 NOTE — Patient Instructions (Signed)
Your physician has requested that you have a lexiscan myoview. For further information please visit [email or web address]. Please follow instruction sheet, as given.  Your physician recommends that you schedule a follow-up appointment following tests.

## 2014-05-30 NOTE — Progress Notes (Signed)
05/30/2014 Ian [name]   [birth year]  [id number]  Primary Physician Ian Simpler, MD Primary Cardiologist: Ian Harp MD Ian [name]   HPI:  The patient is an 81 year old, thin appearing, married, Caucasian male, father of three, grandfather to 28, who is accompanied by his wife today. He is formerly a patient of Dr. Durwin Nora [name]'s. I saw him 10/30/13.  He has a history of coronary artery disease status post bypass grafting in 2001 with a LIMA to his LAD and a porcine valve at that time. He did have a bare metal stent placed to his circumflex coronary artery by Dr. Adrian [name] on December 05, 2007. At that time, his LIMA was patent and his RCA had only mild disease. Abdominal aortography revealed a widely patent left renal artery with a 90% right renal artery stenosis. His last Myoview performed in 2009 was nonischemic. An echo performed in 2011 revealed an aortic valve area of 0.9 cm squared with an EF of 45 to 50% and mild concentric LVH. His other problems include hypertension and hyperlipidemia. He does have progressive Parkinson's disease. When I saw him in the office back in 2013 he was significantly bradycardic and underwent permanent transvenous pacemaker insertion which is currently followed by Dr. Sallyanne [name]. He's had progressive shortness of breath or lower extremity edema as well as unexplained weight loss. He was placed on 40 mg of Lasix which resulted in some improvement in his edema and his dyspnea. He also has a 90% right renal artery stenosis by duplex ultrasound that has remained stable as recently as 09/26/13.physical saw him in July he has had increasing dyspnea on exertion. A 2-D echo performed 05/27/14 revealed a decline in his ejection fraction to 20-25% from 50-55% November 2014 for unclear reasons.   Current Outpatient Prescriptions  Medication Sig Dispense Refill  . aspirin 81 MG tablet Take 81 mg by mouth daily.    . bisoprolol-hydrochlorothiazide (ZIAC)  5-6.25 MG per tablet Take 1 tablet by mouth daily. 90 tablet 3  . carbidopa-levodopa (SINEMET CR) 50-200 MG per tablet Take 1 tablet by mouth at bedtime. 90 tablet 1  . carbidopa-levodopa (SINEMET IR) 25-100 MG per tablet Take 2 tablets by mouth 5 (five) times daily.    . furosemide (LASIX) 40 MG tablet Take 1 tablet (40 mg total) by mouth daily. 90 tablet 3  . lansoprazole (PREVACID) 15 MG capsule Take 15 mg by mouth daily as needed.    . polyethylene glycol (MIRALAX / GLYCOLAX) packet Take 17 g by mouth daily.    . potassium chloride (K-DUR) 10 MEQ tablet Take 1 tablet (10 mEq total) by mouth 2 (two) times daily. 180 tablet 3  . sildenafil (VIAGRA) 50 MG tablet Take 0.5 tablets (25 mg total) by mouth as needed for erectile dysfunction. 6 tablet 0  . vitamin B-12 (CYANOCOBALAMIN) 1000 MCG tablet Take 1,000 mcg by mouth daily.     No current facility-administered medications for this visit.    Allergies  Allergen Reactions  . Plavix [Clopidogrel]     Fatigue   . Pravastatin   . Zocor [Simvastatin]     REACTION: dizziness  . Penicillins Rash    History   Social History  . Marital Status: Married    Spouse Name: N/A  . Number of Children: 3  . Years of Education: N/A   Occupational History  . retired- [employer]   . part-time sub/courier for schools- now only rarely    Social  History Main Topics  . Smoking status: Former Smoker -- 1.00 packs/day for 5 years    Types: Cigarettes    Quit date: 06/06/1963  . Smokeless tobacco: Never Used  . Alcohol Use: No     Comment: very rare  . Drug Use: No  . Sexual Activity: No   Other Topics Concern  . Not on file   Social History Narrative   Has living will   Wife, then son Ian [name], hold health care POA   Requests DNR --done   Requests no tube feeds if cognitively unaware     Review of Systems: General: negative for chills, fever, night sweats or weight changes.  Cardiovascular: negative for chest pain, dyspnea  on exertion, edema, orthopnea, palpitations, paroxysmal nocturnal dyspnea or shortness of breath Dermatological: negative for rash Respiratory: negative for cough or wheezing Urologic: negative for hematuria Abdominal: negative for nausea, vomiting, diarrhea, bright red blood per rectum, melena, or hematemesis Neurologic: negative for visual changes, syncope, or dizziness All other systems reviewed and are otherwise negative except as noted above.    Blood pressure 112/68, pulse 86, height 5\' 8"  (1.727 m), weight 148 lb 1.6 oz (67.178 kg).  General appearance: alert and no distress Neck: no adenopathy, no carotid bruit, no JVD, supple, symmetrical, trachea midline and thyroid not enlarged, symmetric, no tenderness/mass/nodules Lungs: clear to auscultation bilaterally Heart: regular rate and rhythm, S1, S2 normal, no murmur, click, rub or gallop Extremities: extremities normal, atraumatic, no cyanosis or edema  EKG not performed today  ASSESSMENT AND PLAN:   S/P cardiac pacemaker procedure, Medtronic Adapta L  ADDRL1, 01/21/12 History of permanent transvenous pacemaker insertion by Dr. Sallyanne [name] October 2013 which he follows. This was put in for symptomatic bradycardia.   Right renal artery stenosis - 90% History of right renal artery stenosis followed by a duplex ultrasound which has remained stable as recently as 09/26/13.   Essential hypertension, benign History of hypertension with blood pressure measured at 112/68. He is on bisoprolol hydrochlorothiazide as well as furosemide. Continue current meds at current dosing   Coronary artery disease without angina pectoris History of coronary artery disease status post coronary artery bypass grafting in 2001 with aortic valve replacement using a bioprosthetic aortic valve. He denies chest pain but has noticed increasing dyspnea on exertion. Recent 2-D echo revealed a decline in his lethargy ejection fraction from 50% down to 20-25%. His  aortic valve area has remained stable at 1.2 cm squared. I'm not sure the etiology of the decline in in his ejection fraction. . I'm going to get a primary oncologic Myoview stress test to rule out an ischemic etiology.       Ian Harp MD FACP,FACC,FAHA, [facility] 05/30/2014 2:54 PM

## 2014-06-03 ENCOUNTER — Encounter: Payer: Self-pay | Admitting: Cardiology

## 2014-06-10 ENCOUNTER — Encounter: Payer: Self-pay | Admitting: Cardiovascular Disease

## 2014-06-11 ENCOUNTER — Encounter (HOSPITAL_COMMUNITY): Payer: Medicare Other

## 2014-06-11 ENCOUNTER — Telehealth (HOSPITAL_COMMUNITY): Payer: Self-pay

## 2014-06-11 NOTE — Telephone Encounter (Signed)
Encounter complete. 

## 2014-06-13 ENCOUNTER — Ambulatory Visit (HOSPITAL_COMMUNITY)
Admission: RE | Admit: 2014-06-13 | Discharge: 2014-06-13 | Disposition: A | Discharge status: Home / Self Care | Payer: Medicare Other | Source: Ambulatory Visit | Attending: Cardiovascular Disease | Admitting: Cardiovascular Disease

## 2014-06-13 DIAGNOSIS — I519 Heart disease, unspecified: Secondary | ICD-10-CM

## 2014-06-13 DIAGNOSIS — Z955 Presence of coronary angioplasty implant and graft: Secondary | ICD-10-CM | POA: Insufficient documentation

## 2014-06-13 DIAGNOSIS — Z951 Presence of aortocoronary bypass graft: Secondary | ICD-10-CM | POA: Diagnosis not present

## 2014-06-13 DIAGNOSIS — R0600 Dyspnea, unspecified: Secondary | ICD-10-CM | POA: Diagnosis not present

## 2014-06-13 DIAGNOSIS — R0602 Shortness of breath: Secondary | ICD-10-CM | POA: Diagnosis not present

## 2014-06-13 DIAGNOSIS — I2510 Atherosclerotic heart disease of native coronary artery without angina pectoris: Secondary | ICD-10-CM | POA: Diagnosis not present

## 2014-06-13 MED ORDER — TECHNETIUM TC 99M SESTAMIBI GENERIC - CARDIOLITE
10.8000 | Freq: Once | INTRAVENOUS | Status: AC | PRN
  Administered 2014-06-13: 11 via INTRAVENOUS

## 2014-06-13 MED ORDER — REGADENOSON 0.4 MG/5ML IV SOLN
0.4000 mg | Freq: Once | INTRAVENOUS | Status: AC
  Administered 2014-06-13: 0.4 mg via INTRAVENOUS

## 2014-06-13 MED ORDER — AMINOPHYLLINE 25 MG/ML IV SOLN
75.0000 mg | Freq: Once | INTRAVENOUS | Status: AC
  Administered 2014-06-13: 75 mg via INTRAVENOUS

## 2014-06-13 MED ORDER — TECHNETIUM TC 99M SESTAMIBI GENERIC - CARDIOLITE
30.4000 | Freq: Once | INTRAVENOUS | Status: AC | PRN
  Administered 2014-06-13: 30 via INTRAVENOUS

## 2014-06-13 NOTE — Procedures (Addendum)
[facility] [address] [place] [address] [phone]  Cardiology Nuclear Med Study  WOODRUFF SKIRVIN is a 81 y.o. male     MRN : [id number]     DOB: 1929-03-22  Procedure Date: 06/13/2014  Nuclear Med Background Indication for Stress Test:  Evaluation for Ischemia and Follow up CAD History:  CAD;CABG w/valve replacement n 2009;STENT/PTCA-2009;RBBB;PTVP-2013;Last NUC MPI on 11/22/2007-ischemia;EF=66%ECHO on 05/27/2014-EF=20-25% Cardiac Risk Factors: CVA, Family History - CAD, History of Smoking, Hypertension, Lipids, PVD, RBBB and parkinsons;renal PVD  Symptoms:  Dizziness, DOE, Fatigue, Light-Headedness and SOB   Nuclear Pre-Procedure Caffeine/Decaff Intake:  9:00pm NPO After: 5:00am   IV Site: R Forearm  IV 0.9% NS with Angio Cath:  22g  Chest Size (in):  38" IV Started by: Rolene Course, RN  Height: 5\' 8"  (1.727 m)  Cup Size: n/a  BMI:  Body mass index is 22.51 kg/(m^2). Weight:  148 lb (67.132 kg)   Tech Comments:  n/a    Nuclear Med Study 1 or 2 day study: 1 day  Stress Test Type:  [place] Provider:  Quay Burow, MD   Resting Radionuclide: Technetium 88m Sestamibi  Resting Radionuclide Dose: 10.8 mCi   Stress Radionuclide:  Technetium 15m Sestamibi  Stress Radionuclide Dose: 30.4 mCi           Stress Protocol Rest HR: 63 Stress HR: 77  Rest BP: 110/63 Stress BP: 118/59  Exercise Time (min): n/a METS: n/a          Dose of Adenosine (mg):  n/a Dose of Lexiscan: 0.4 mg  Dose of Atropine (mg): n/a Dose of Dobutamine: n/a mcg/kg/min (at max HR)  Stress Test Technologist: Mellody Memos, CCT Nuclear Technologist: Imagene Riches, CNMT   Rest Procedure:  Myocardial perfusion imaging was performed at rest 45 minutes following the intravenous administration of Technetium 63m Sestamibi. Stress Procedure:  The patient received IV Lexiscan 0.4 mg over 15-seconds.  Technetium 43m Sestamibi  injected Iv at 30-seconds.  Patient experienced flushing, nausea and was administered 75 mg of Aminophylline at 5 minutes. There were no significant changes with Lexiscan.  Quantitative spect images were obtained after a 45 minute delay.  Transient Ischemic Dilatation (Normal <1.22):  1.13  QGS EDV:  n/a ml QGS ESV:  n/a ml LV Ejection Fraction: Study not gated      Rest ECG: Atrial sensed, ventricular paced  Stress ECG: There are scattered PVCs. and fusion beats  QPS Raw Data Images:  mild cardiomegaly Stress Images:  moderate-severe reduction in tracer uptake in the inferior wall, moderately reduced tracer uptake in the apex, septum and apical segments of anterior and lateral walls Rest Images:  there is minimal improvement in the inferior wall abnormality; there is moderate improvement in the septum, apex and anterolateral wall Subtraction (SDS):  findings suggest a large inferior wall scar (versus diaphragmatic atenuation artifact) and ischemia in the apex, septum and apical segments of the anterior and lateral walls LV Wall Motion:  not gated  Impression Exercise Capacity:  Lexiscan with no exercise. BP Response:  Normal blood pressure response. Clinical Symptoms:  No significant symptoms noted. ECG Impression:  No significant ECG changes with Lexiscan. Comparison with Prior Nuclear Study: No previous nuclear study performed   Overall Impression:  High risk stress nuclear study with a large area of ischemia in the distribution of the LAD artery and a fixed defect in the inferior wall (in the absence of gated images  cannot distinguish scar from diaphragmatic attenuation). While RV pacing artifact may be contributing to the septal abnormality, it would not explain the reversible nature of the defect.Sanda Klein, MD  06/13/2014 1:12 PM

## 2014-06-14 ENCOUNTER — Telehealth: Payer: Self-pay

## 2014-06-14 NOTE — Telephone Encounter (Signed)
Patient called 06/13/14 myoview high risk.DOD Dr.Hochrein reviewed and advised to schedule appointment to see [facility] Monday 06/17/14.Appointment scheduled with Dr.Berry 2/29/26 at 1:30 pm.

## 2014-06-17 ENCOUNTER — Encounter: Payer: Self-pay | Admitting: Cardiovascular Disease

## 2014-06-17 ENCOUNTER — Ambulatory Visit
Admission: RE | Admit: 2014-06-17 | Discharge: 2014-06-17 | Disposition: A | Discharge status: Home / Self Care | Payer: Medicare Other | Source: Ambulatory Visit | Attending: Cardiovascular Disease | Admitting: Cardiovascular Disease

## 2014-06-17 ENCOUNTER — Ambulatory Visit (INDEPENDENT_AMBULATORY_CARE_PROVIDER_SITE_OTHER): Payer: Medicare Other | Admitting: Cardiovascular Disease

## 2014-06-17 VITALS — BP 110/72 | HR 92 | Ht 68.0 in | Wt 150.7 lb

## 2014-06-17 DIAGNOSIS — R5383 Other fatigue: Secondary | ICD-10-CM

## 2014-06-17 DIAGNOSIS — R9439 Abnormal result of other cardiovascular function study: Secondary | ICD-10-CM

## 2014-06-17 DIAGNOSIS — Z01812 Encounter for preprocedural laboratory examination: Secondary | ICD-10-CM

## 2014-06-17 DIAGNOSIS — D689 Coagulation defect, unspecified: Secondary | ICD-10-CM

## 2014-06-17 DIAGNOSIS — Z87891 Personal history of nicotine dependence: Secondary | ICD-10-CM

## 2014-06-17 DIAGNOSIS — I701 Atherosclerosis of renal artery: Secondary | ICD-10-CM

## 2014-06-17 DIAGNOSIS — R931 Abnormal findings on diagnostic imaging of heart and coronary circulation: Secondary | ICD-10-CM | POA: Diagnosis not present

## 2014-06-17 DIAGNOSIS — Z01810 Encounter for preprocedural cardiovascular examination: Secondary | ICD-10-CM | POA: Diagnosis not present

## 2014-06-17 DIAGNOSIS — I2510 Atherosclerotic heart disease of native coronary artery without angina pectoris: Secondary | ICD-10-CM | POA: Diagnosis not present

## 2014-06-17 LAB — CBC
HEMATOCRIT: 41.8 % (ref 39.0–52.0)
HEMOGLOBIN: 14 g/dL (ref 13.0–17.0)
MCH: 30.4 pg (ref 26.0–34.0)
MCHC: 33.5 g/dL (ref 30.0–36.0)
MCV: 90.9 fL (ref 78.0–100.0)
MPV: 9.2 fL (ref 8.6–12.4)
Platelets: 328 10*3/uL (ref 150–400)
RBC: 4.6 MIL/uL (ref 4.22–5.81)
RDW: 14 % (ref 11.5–15.5)
WBC: 10.7 10*3/uL — AB (ref 4.0–10.5)

## 2014-06-17 LAB — BASIC METABOLIC PANEL
BUN: 19 mg/dL (ref 6–23)
CHLORIDE: 99 meq/L (ref 96–112)
CO2: 33 mEq/L — ABNORMAL HIGH (ref 19–32)
CREATININE: 0.89 mg/dL (ref 0.50–1.35)
Calcium: 9.5 mg/dL (ref 8.4–10.5)
Glucose, Bld: 92 mg/dL (ref 70–99)
Potassium: 4.5 mEq/L (ref 3.5–5.3)
Sodium: 140 mEq/L (ref 135–145)

## 2014-06-17 LAB — TSH: TSH: 4.349 u[IU]/mL (ref 0.350–4.500)

## 2014-06-17 NOTE — Progress Notes (Signed)
Mr. Horn returns today for follow-up of his outpatient tests. His 2-D echocardiogram revealed severe LV dysfunction with an ejection fraction of 20%. This represents a marked decline in his previously documented EF of 50-55%. His aortic bioprosthesis appeared to be functioning normally. A Myoview stress test performed on 06/13/14 which showed a high risk with ischemia in the LAD territory. Based on this, I decided to proceed with outpatient right and left heart cath to define his anatomy and physiology.   Lorretta Harp, M.D., [place], [facility], [place] [address] [place], [place]  [address]  [phone] 06/17/2014 1:57 PM

## 2014-06-17 NOTE — Patient Instructions (Signed)
Your physician has requested that you have a cardiac catheterization (Right Groin) (Please schedule for Thursday 3/4 if possible). Cardiac catheterization is used to diagnose and/or treat various heart conditions. Doctors may recommend this procedure for a number of different reasons. The most common reason is to evaluate chest pain. Chest pain can be a symptom of coronary artery disease (CAD), and cardiac catheterization can show whether plaque is narrowing or blocking your heart's arteries. This procedure is also used to evaluate the valves, as well as measure the blood flow and oxygen levels in different parts of your heart. For further information please visit [email or web address].   Following your catheterization, you will not be allowed to drive for 3 days.  No lifting, pushing, or pulling greater that 10 pounds is allowed for 1 week.  You will be required to have the following tests prior to the procedure:  1. Blood work-the blood work can be done no more than 7 days prior to the procedure.  It can be done at any [facility] lab.  There is one downstairs on the first floor of this building and one in the [place] ([address])  2. Chest Xray-the chest xray order has already been placed at the [facility].

## 2014-06-17 NOTE — Assessment & Plan Note (Signed)
Mr. Lacher returns today for follow-up of his outpatient diagnostic tests. A 2-D echocardiogram performed on 05/29/14 revealed severe LV dysfunction with an ejection fraction in the 20% range.a Myoview stress test performed 06/13/14 revealed ischemia in the LAD territory. Based on this, I decided to proceed with outpatient right and left heart cath to define his anatomy and physiology. Patient and his wife agree to proceed.

## 2014-06-18 LAB — PROTIME-INR
INR: 1.09 (ref <1.50)
PROTHROMBIN TIME: 14.1 s (ref 11.6–15.2)

## 2014-06-18 LAB — APTT: aPTT: 34 seconds (ref 24–37)

## 2014-06-20 ENCOUNTER — Inpatient Hospital Stay (HOSPITAL_COMMUNITY)
Admission: RE | Admit: 2014-06-20 | Discharge: 2014-06-21 | DRG: 287 | Disposition: A | Discharge status: Home / Self Care | Payer: Medicare Other | Source: Ambulatory Visit | Attending: Cardiovascular Disease | Admitting: Cardiovascular Disease

## 2014-06-20 ENCOUNTER — Encounter (HOSPITAL_COMMUNITY)
Admission: RE | Disposition: A | Discharge status: Home / Self Care | Payer: Self-pay | Source: Ambulatory Visit | Attending: Cardiovascular Disease

## 2014-06-20 ENCOUNTER — Encounter (HOSPITAL_COMMUNITY): Payer: Self-pay | Admitting: Internal Medicine

## 2014-06-20 DIAGNOSIS — I701 Atherosclerosis of renal artery: Secondary | ICD-10-CM | POA: Diagnosis present

## 2014-06-20 DIAGNOSIS — G20 Parkinson's disease: Secondary | ICD-10-CM | POA: Diagnosis present

## 2014-06-20 DIAGNOSIS — N529 Male erectile dysfunction, unspecified: Secondary | ICD-10-CM | POA: Diagnosis present

## 2014-06-20 DIAGNOSIS — Z79899 Other long term (current) drug therapy: Secondary | ICD-10-CM | POA: Diagnosis not present

## 2014-06-20 DIAGNOSIS — C884 Extranodal marginal zone B-cell lymphoma of mucosa-associated lymphoid tissue [MALT-lymphoma]: Secondary | ICD-10-CM | POA: Diagnosis present

## 2014-06-20 DIAGNOSIS — R9439 Abnormal result of other cardiovascular function study: Secondary | ICD-10-CM | POA: Insufficient documentation

## 2014-06-20 DIAGNOSIS — M1990 Unspecified osteoarthritis, unspecified site: Secondary | ICD-10-CM | POA: Diagnosis present

## 2014-06-20 DIAGNOSIS — R0602 Shortness of breath: Secondary | ICD-10-CM | POA: Diagnosis not present

## 2014-06-20 DIAGNOSIS — I255 Ischemic cardiomyopathy: Secondary | ICD-10-CM | POA: Diagnosis present

## 2014-06-20 DIAGNOSIS — Z7982 Long term (current) use of aspirin: Secondary | ICD-10-CM

## 2014-06-20 DIAGNOSIS — Z01812 Encounter for preprocedural laboratory examination: Secondary | ICD-10-CM

## 2014-06-20 DIAGNOSIS — Z951 Presence of aortocoronary bypass graft: Secondary | ICD-10-CM | POA: Diagnosis not present

## 2014-06-20 DIAGNOSIS — I442 Atrioventricular block, complete: Secondary | ICD-10-CM | POA: Diagnosis present

## 2014-06-20 DIAGNOSIS — Z953 Presence of xenogenic heart valve: Secondary | ICD-10-CM | POA: Diagnosis not present

## 2014-06-20 DIAGNOSIS — K219 Gastro-esophageal reflux disease without esophagitis: Secondary | ICD-10-CM | POA: Diagnosis present

## 2014-06-20 DIAGNOSIS — R931 Abnormal findings on diagnostic imaging of heart and coronary circulation: Secondary | ICD-10-CM | POA: Diagnosis not present

## 2014-06-20 DIAGNOSIS — Z955 Presence of coronary angioplasty implant and graft: Secondary | ICD-10-CM | POA: Diagnosis not present

## 2014-06-20 DIAGNOSIS — I5022 Chronic systolic (congestive) heart failure: Secondary | ICD-10-CM | POA: Diagnosis present

## 2014-06-20 DIAGNOSIS — D689 Coagulation defect, unspecified: Secondary | ICD-10-CM

## 2014-06-20 DIAGNOSIS — E785 Hyperlipidemia, unspecified: Secondary | ICD-10-CM | POA: Diagnosis present

## 2014-06-20 DIAGNOSIS — Z8673 Personal history of transient ischemic attack (TIA), and cerebral infarction without residual deficits: Secondary | ICD-10-CM

## 2014-06-20 DIAGNOSIS — R5383 Other fatigue: Secondary | ICD-10-CM

## 2014-06-20 DIAGNOSIS — I10 Essential (primary) hypertension: Secondary | ICD-10-CM | POA: Diagnosis present

## 2014-06-20 DIAGNOSIS — I2510 Atherosclerotic heart disease of native coronary artery without angina pectoris: Secondary | ICD-10-CM | POA: Diagnosis not present

## 2014-06-20 DIAGNOSIS — Z87891 Personal history of nicotine dependence: Secondary | ICD-10-CM

## 2014-06-20 DIAGNOSIS — I519 Heart disease, unspecified: Secondary | ICD-10-CM | POA: Diagnosis not present

## 2014-06-20 DIAGNOSIS — I259 Chronic ischemic heart disease, unspecified: Secondary | ICD-10-CM | POA: Diagnosis present

## 2014-06-20 HISTORY — DX: Bradycardia, unspecified: R00.1

## 2014-06-20 HISTORY — DX: Atherosclerosis of renal artery: I70.1

## 2014-06-20 HISTORY — DX: Transient cerebral ischemic attack, unspecified: G45.9

## 2014-06-20 HISTORY — PX: CARDIAC CATHETERIZATION: SHX172

## 2014-06-20 LAB — MRSA PCR SCREENING: MRSA by PCR: NEGATIVE

## 2014-06-20 SURGERY — CORONARY/GRAFT ANGIOGRAPHY

## 2014-06-20 MED ORDER — CARBIDOPA-LEVODOPA ER 50-200 MG PO TBCR
1.0000 | EXTENDED_RELEASE_TABLET | Freq: Every day | ORAL | Status: DC
  Administered 2014-06-20: 1 via ORAL
  Filled 2014-06-20 (×2): qty 1

## 2014-06-20 MED ORDER — FUROSEMIDE 40 MG PO TABS
40.0000 mg | ORAL_TABLET | Freq: Every day | ORAL | Status: DC
  Administered 2014-06-21: 40 mg via ORAL
  Filled 2014-06-20: qty 1

## 2014-06-20 MED ORDER — BISOPROLOL-HYDROCHLOROTHIAZIDE 5-6.25 MG PO TABS
1.0000 | ORAL_TABLET | Freq: Every day | ORAL | Status: DC
  Administered 2014-06-21: 1 via ORAL
  Filled 2014-06-20: qty 1

## 2014-06-20 MED ORDER — MORPHINE SULFATE 2 MG/ML IJ SOLN: 2.0000 mg | INTRAMUSCULAR | Status: DC | PRN

## 2014-06-20 MED ORDER — LIDOCAINE HCL (PF) 1 % IJ SOLN
INTRAMUSCULAR | Status: AC
  Filled 2014-06-20: qty 30

## 2014-06-20 MED ORDER — ASPIRIN 81 MG PO CHEW
81.0000 mg | CHEWABLE_TABLET | Freq: Every day | ORAL | Status: DC
  Administered 2014-06-21: 81 mg via ORAL
  Filled 2014-06-20: qty 1

## 2014-06-20 MED ORDER — CARBIDOPA-LEVODOPA 25-100 MG PO TABS
2.0000 | ORAL_TABLET | ORAL | Status: DC
  Administered 2014-06-21 (×3): 2 via ORAL
  Filled 2014-06-20 (×7): qty 2

## 2014-06-20 MED ORDER — HEPARIN (PORCINE) IN NACL 2-0.9 UNIT/ML-% IJ SOLN
INTRAMUSCULAR | Status: AC
  Filled 2014-06-20: qty 1500

## 2014-06-20 MED ORDER — PANTOPRAZOLE SODIUM 40 MG PO TBEC
40.0000 mg | DELAYED_RELEASE_TABLET | Freq: Every day | ORAL | Status: DC
  Administered 2014-06-21: 40 mg via ORAL
  Filled 2014-06-20: qty 1

## 2014-06-20 MED ORDER — SODIUM CHLORIDE 0.9 % IV SOLN
INTRAVENOUS | Status: DC
  Administered 2014-06-20: 12:00:00 via INTRAVENOUS

## 2014-06-20 MED ORDER — ASPIRIN 81 MG PO CHEW: 81.0000 mg | CHEWABLE_TABLET | Freq: Every day | ORAL | Status: DC

## 2014-06-20 MED ORDER — ONDANSETRON HCL 4 MG/2ML IJ SOLN: 4.0000 mg | Freq: Four times a day (QID) | INTRAMUSCULAR | Status: DC | PRN

## 2014-06-20 MED ORDER — SODIUM CHLORIDE 0.9 % IV SOLN: INTRAVENOUS | Status: AC

## 2014-06-20 MED ORDER — POTASSIUM CHLORIDE ER 10 MEQ PO TBCR
10.0000 meq | EXTENDED_RELEASE_TABLET | Freq: Two times a day (BID) | ORAL | Status: DC
  Administered 2014-06-20 – 2014-06-21 (×2): 10 meq via ORAL
  Filled 2014-06-20 (×3): qty 1

## 2014-06-20 MED ORDER — SODIUM CHLORIDE 0.9 % IJ SOLN: 3.0000 mL | INTRAMUSCULAR | Status: DC | PRN

## 2014-06-20 MED ORDER — SODIUM CHLORIDE 0.9 % IV SOLN
INTRAVENOUS | Status: AC
  Administered 2014-06-20: 17:00:00 via INTRAVENOUS

## 2014-06-20 MED ORDER — ACETAMINOPHEN 325 MG PO TABS: 650.0000 mg | ORAL_TABLET | ORAL | Status: DC | PRN

## 2014-06-20 MED ORDER — ASPIRIN EC 81 MG PO TBEC: 81.0000 mg | DELAYED_RELEASE_TABLET | Freq: Every day | ORAL | Status: DC

## 2014-06-20 MED ORDER — MORPHINE SULFATE 2 MG/ML IJ SOLN: 1.0000 mg | INTRAMUSCULAR | Status: DC | PRN

## 2014-06-20 MED ORDER — ASPIRIN 81 MG PO CHEW: 81.0000 mg | CHEWABLE_TABLET | ORAL | Status: DC

## 2014-06-20 NOTE — CV Procedure (Signed)
Ian [name] is a 81 y.o. male    [id number] LOCATION:  FACILITY: [facility]  PHYSICIAN: Ian [name], M.D. 1928/12/22   DATE OF PROCEDURE:  06/20/2014  DATE OF DISCHARGE:     CARDIAC CATHETERIZATION     History obtained from chart review.The patient is an 81 year old, thin appearing, married, Caucasian male, father of three, grandfather to 41, who is accompanied by his wife today. He is formerly a patient of Dr. Durwin Nora [name]'s. I saw him 10/30/13. He has a history of coronary artery disease status post bypass grafting in 2001 with a LIMA to his LAD and a porcine valve at that time. He did have a bare metal stent placed to his circumflex coronary artery by Dr. Adrian [name] on December 05, 2007. At that time, his LIMA was patent and his RCA had only mild disease. Abdominal aortography revealed a widely patent left renal artery with a 90% right renal artery stenosis. His last Myoview performed in 2009 was nonischemic. An echo performed in 2011 revealed an aortic valve area of 0.9 cm squared with an EF of 45 to 50% and mild concentric LVH. His other problems include hypertension and hyperlipidemia. He does have progressive Parkinson's disease. When I saw him in the office back in 2013 he was significantly bradycardic and underwent permanent transvenous pacemaker insertion which is currently followed by Dr. Sallyanne [name]. He's had progressive shortness of breath or lower extremity edema as well as unexplained weight loss. He was placed on 40 mg of Lasix which resulted in some improvement in his edema and his dyspnea. He also has a 90% right renal artery stenosis by duplex ultrasound that has remained stable as recently as 09/26/13.physical saw him in July he has had increasing dyspnea on exertion. A 2-D echo performed 05/27/14 revealed a decline in his ejection fraction to 20-25% from 50-55% November 2014 for unclear reasons. A Myoview stress test showed inferolateral ischemia. Based on this, I decided to proceed  with outpatient cardiac catheterization to define his anatomy and physiology.   PROCEDURE DESCRIPTION:   The patient was brought to the second floor [facility]iac cath lab in the postabsorptive state. He was not premedicated with. His right groinwas prepped and shaved in usual sterile fashion. Xylocaine 1% was used for local anesthesia. A 5 French sheath was inserted into the right common femoral artery using standard Seldinger technique. A 7 French sheath was inserted into the right common femoral vein. A 7 French balloon-tip thermal dilution Swan-Ganz catheter was used to obtain sequential right heart pressures. However, since he had a biventricular ICD implant it was decided not to proceed with a full right heart cath for the dislodging the leads. 5 French right and left Judkins Diagnostic catheters were used for selective coronary angiography and LIMA graft angiography. The aortic valve was not crossed. Visipaque dye was used  for the entirety of the case. Retrograde aortic pressure was monitored during the case.   HEMODYNAMICS:    AO SYSTOLIC/AO DIASTOLIC: 657/84   LV SYSTOLIC/LV DIASTOLIC: not performed  ANGIOGRAPHIC RESULTS:   1. Left main; normal  2. LAD; 80% proximal to mid, occluded distally 3. Left circumflex; nondominant with a 40-50% smooth ostial/proximal stenosis. The stent in the AV groove was widely patent.  4. Right coronary artery; dominant with minimal irregularities 5.LIMA TO LAD; widely patent 7. Left ventriculography; not performed today  IMPRESSION:[name] has a patent LIMA to the LAD with a patent dominant right and mild circumflex disease. His RA pressure and  RV pressures were low. I did not perform a full right heart cath because he had atrial and ventricular leads in place. I do not think the etiology for his decline in LV function is ischemically mediated. Continue medical therapy will be recommended. The sheaths were removed and pressure held on the groin  to achieve hemostasis. The patient left the lab in stable condition. He will remember coming for 6 hours, will be gently hydrated overnight and discharged home in the morning. He left the lab in stable condition.  Ian Harp MD, [facility] 06/20/2014 3:55 PM

## 2014-06-20 NOTE — Progress Notes (Signed)
Site area: RFA Site Prior to Removal:  Level 0 Pressure Applied For:60min Manual:   yes Patient Status During Pull:  stable Post Pull Site:  Level 0 Post Pull Instructions Given:  yes Post Pull Pulses Present: doppler Dressing Applied:  clear Bedrest begins @ 1640 Comments:

## 2014-06-20 NOTE — Consult Note (Signed)
ELECTROPHYSIOLOGY CONSULT NOTE    Primary Care Physician: Viviana Simpler, MD Referring Physician:  Dr Gwenlyn Found  Admit Date: 06/20/2014  Reason for consultation:  CHB/ reduced EF  Ian [name] is a 81 y.o. male with a h/o complete heart block s/p PPM by me in 2013 for CHB now admitted post cath.  He has multiple chronic medical conditions including CAD, parkinsons disease, and valvular heart disease.  He has recently developed clinical decline for which an echo revealed EF 20%.  Cath today revealed patent LIMA and medical management was advised.  EP is consulted for consideration of device upgrade to CRT.   The patient and his spouse report that he had significant edema several months ago.  They attributed this to his beta blocker therapy and report that it improved once he stopped it.  It appears that actually continues to take bisoprolol/hctz.  He has mild SOB.  He is also quite limited by his advanced age and Parkinsons disease.  He helps his wife with dishes and small chores around their house.   Today, he denies symptoms of palpitations, chest pain,   orthopnea, PND,   presyncope, syncope, or neurologic sequela. The patient is tolerating medications without difficulties and is otherwise without complaint today.   Past Medical History  Diagnosis Date  . CAD (coronary artery disease) CABG x1 2001; PCI 2008    s/p LIMA-LAD in2001 with AVR; BMS to LCx 8/08  . Allergy   . Diverticulitis   . GERD (gastroesophageal reflux disease)   . Hypertension   . Osteoarthritis   . Osteoporosis   . MALT lymphoma   . Parkinson's disease   . ASCVD (arteriosclerotic cardiovascular disease) 06/08    TIA  . Hyperlipidemia   . ED (erectile dysfunction)   . Aortic valve stenosis, acquired 2001    s/p Porcine AVR; by 05/2009 Echo - EF 45-50%, moderate AS -- AVA 0.98 cm2, peak gradient 86mmHg  . Right renal artery stenosis 2012    90% stenosis - stable  . S/P cardiac pacemaker procedure, Medtronic Adapta L   ADDRL1, 01/21/12 01/22/2012  . LV dysfunction    Past Surgical History  Procedure Laterality Date  . Coronary artery bypass graft  2001    Lucianne Lei trigt ) aortic valve replacement 2001; LIMA-LAD.  Marland Kitchen Esophagogastroduodenoscopy      multiple, Colon/ EGD benign 11/2004  . Pilonidal cyst / sinus excision  1953  . Inguinal hernia repair      LIH 1997Llap. bilateral hernias 1998  . Coronary stent placement  11/2007    8/09  Left Circumflex Stent by Dr Toy Care started and aggrenox stopped  . Cataract extraction  06/09    left  . Laparoscopic cholecystectomy  07/11    Dr.Rosenbower  . Squamous cell carcinoma excision  3/13    back  . Aortic valve replacement  2001    Porcine valve  . Pacemaker insertion  01/21/2012    Medtronic Adapta L implanted for complete heart block  . Nm myocar perf wall motion  11/22/2007    mild septal ischemia,EF 66%  . Permanent pacemaker insertion N/A 01/21/2012    Procedure: PERMANENT PACEMAKER INSERTION;  Surgeon: Thompson Grayer, MD;  Location: [facility] CATH LAB;  Service: Cardiovascular;  Laterality: N/A;    . aspirin  81 mg Oral Daily  . [START ON 06/21/2014] bisoprolol-hydrochlorothiazide  1 tablet Oral Daily  . carbidopa-levodopa  1 tablet Oral QHS  . [START ON 06/21/2014] carbidopa-levodopa  2 tablet Oral 5  times per day  . [START ON 06/21/2014] furosemide  40 mg Oral Daily  . pantoprazole  40 mg Oral QAC breakfast  . potassium chloride  10 mEq Oral BID   . sodium chloride      Allergies  Allergen Reactions  . Plavix [Clopidogrel]     Fatigue   . Pravastatin   . Zocor [Simvastatin]     REACTION: dizziness  . Penicillins Rash    History   Social History  . Marital Status: Married    Spouse Name: N/A  . Number of Children: 3  . Years of Education: N/A   Occupational History  . retired- [employer]   . part-time sub/courier for schools- now only rarely    Social History Main Topics  . Smoking status: Former Smoker -- 1.00 packs/day for 5  years    Types: Cigarettes    Quit date: 06/06/1963  . Smokeless tobacco: Never Used  . Alcohol Use: No     Comment: very rare  . Drug Use: No  . Sexual Activity: No   Other Topics Concern  . Not on file   Social History Narrative   Has living will   Wife, then son [name], hold health care POA   Requests DNR --done   Requests no tube feeds if cognitively unaware    Family History  Problem Relation Age of Onset  . Heart disease Brother   . Asthma Father     ROS- All systems are reviewed and negative except as per the HPI above  Physical Exam: Telemetry: Filed Vitals:   06/20/14 1700 06/20/14 1800 06/20/14 1900 06/20/14 2000  BP: 143/55 168/85 124/49 108/44  Pulse: 68   64  Temp: 98.1 F (36.7 C)   98 F (36.7 C)  TempSrc: Oral   Oral  Resp: 18 19 20 19   Height: 5\' 8"  (1.727 m)     Weight: 150 lb 5.7 oz (68.2 kg)     SpO2: 99% 94% 94% 99%    GEN- The patient is elderly appearing, alert and oriented x 3 today.   Head- normocephalic, atraumatic Eyes-  Sclera clear, conjunctiva pink Ears- hearing intact Oropharynx- clear Neck- supple  Lungs- Clear to ausculation bilaterally, normal work of breathing Heart- Regular rate and rhythm, 2/6 SEM LUSB GI- soft, NT, ND, + BS Extremities- no clubbing, cyanosis, + dependant edema MS- no significant deformity or atrophy Skin- pacemaker pocket is well healed Psych- euthymic mood, full affect Neuro- strength and sensation are intact  EKG- sinus with V pacing  Labs:   Lab Results  Component Value Date   WBC 10.7* 06/17/2014   HGB 14.0 06/17/2014   HCT 41.8 06/17/2014   MCV 90.9 06/17/2014   PLT 328 06/17/2014    Recent Labs Lab 06/17/14 1533  NA 140  K 4.5  CL 99  CO2 33*  BUN 19  CREATININE 0.89  CALCIUM 9.5  GLUCOSE 92   No results found for: CKTOTAL, CKMB, CKMBINDEX, TROPONINI Lab Results  Component Value Date   CHOL 163 03/29/2012   CHOL 159 03/02/2011   CHOL 175 04/02/2009   Lab Results    Component Value Date   HDL 54.80 03/29/2012   HDL 65.60 03/02/2011   HDL 53.60 04/02/2009   Lab Results  Component Value Date   LDLCALC 93 03/29/2012   LDLCALC 84 03/02/2011   LDLCALC 106* 04/02/2009   Lab Results  Component Value Date   TRIG 75.0 03/29/2012   TRIG  49.0 03/02/2011   TRIG 78.0 04/02/2009   Lab Results  Component Value Date   CHOLHDL 3 03/29/2012   CHOLHDL 2 03/02/2011   CHOLHDL 3 04/02/2009   Lab Results  Component Value Date   LDLDIRECT 98.3 07/21/2006      PPM interrogation: device interrogation is reviewed and reveals normal pacemaker function.  He V paces >95%.  He has 1:1 AV conduction today however his AV conduction time is > 400 msec.  ASSESSMENT AND PLAN:  1. Complete heart block/ chronic systolic dysfunction The patient V paces >95% with EF 20%.  Given his advanced age, I do not feel that he is a candidate for ICD implant.  We did however discuss the possibility of upgrade to a CRT-P device.  Risks, benefits, and alternatives to this procedure were discussed with the patient and his spouse today.  They understand that the risks include but are not limited to bleeding, infection, pneumothorax, perforation, tamponade, vascular damage, renal failure, MI, stroke, death,  and lead dislodgement.  At this time, the patient is not convinced that his quality of life suffers enough to warrant this procedure.  He would therefore prefer to continue medical management  He could potentially receive clinical benefit from an ace inhibitor, however given RAS, I will defer to Dr Gwenlyn Found. He will continue to follow with Dr Gwenlyn Found.  If he decides to reconsider device upgrade in the future, then I would be happy to arrange/ discuss this further.  Electrophysiology team to see as needed while here. Please call with questions.    Thompson Grayer, MD 06/20/2014  8:29 PM

## 2014-06-21 ENCOUNTER — Encounter (HOSPITAL_COMMUNITY): Payer: Self-pay | Admitting: *Deleted

## 2014-06-21 DIAGNOSIS — I255 Ischemic cardiomyopathy: Secondary | ICD-10-CM

## 2014-06-21 DIAGNOSIS — R931 Abnormal findings on diagnostic imaging of heart and coronary circulation: Secondary | ICD-10-CM

## 2014-06-21 DIAGNOSIS — I5022 Chronic systolic (congestive) heart failure: Secondary | ICD-10-CM

## 2014-06-21 DIAGNOSIS — I519 Heart disease, unspecified: Secondary | ICD-10-CM

## 2014-06-21 NOTE — Progress Notes (Signed)
Subjective:  No chest pain  Objective:   Vital Signs : Filed Vitals:   06/21/14 0200 06/21/14 0300 06/21/14 0800 06/21/14 1145  BP: 96/63 129/52 116/57 133/33  Pulse:  63    Temp:  97.5 F (36.4 C) 98.2 F (36.8 C) 97.7 F (36.5 C)  TempSrc:  Oral Oral Oral  Resp: 18 18 21    Height:      Weight:      SpO2: 94% 95% 97%     Intake/Output from previous day:  Intake/Output Summary (Last 24 hours) at 06/21/14 1207 Last data filed at 06/21/14 0500  Gross per 24 hour  Intake 881.25 ml  Output      0 ml  Net 881.25 ml   Net: +881  I/O since admission:  Wt Readings from Last 3 Encounters:  06/20/14 150 lb 5.7 oz (68.2 kg)  06/17/14 150 lb 11.2 oz (68.357 kg)  06/13/14 148 lb (67.132 kg)    Medications: . aspirin  81 mg Oral Daily  . bisoprolol-hydrochlorothiazide  1 tablet Oral Daily  . carbidopa-levodopa  1 tablet Oral QHS  . carbidopa-levodopa  2 tablet Oral 5 times per day  . furosemide  40 mg Oral Daily  . pantoprazole  40 mg Oral QAC breakfast  . potassium chloride  10 mEq Oral BID       Physical Exam:   General appearance: alert, cooperative and no distress Neck: no JVD, supple, symmetrical, trachea midline and thyroid not enlarged, symmetric, no tenderness/mass/nodules Lungs: clear to auscultation bilaterally Heart: regular rate and rhythm and 1/6 systolic murmur Abdomen: soft, non-tender; bowel sounds normal; no masses,  no organomegaly Extremities: no edema, redness or tenderness in the calves or thighs Pulses: 2+ and symmetric; R groin cath site stable Neurologic: Parkinson's tremor UE bilaterally   Rate: 70  Rhythm: paced  ECG (independently read by me):  Lab Results:  BMP Latest Ref Rng 06/17/2014 09/05/2013 01/07/2013  Glucose 70 - 99 mg/dL 92 91 109(H)  BUN 6 - 23 mg/dL 19 15 14   Creatinine 0.50 - 1.35 mg/dL 0.89 0.89 0.73  Sodium 135 - 145 mEq/L 140 139 137  Potassium 3.5 - 5.3 mEq/L 4.5 4.4 3.6  Chloride 96 - 112 mEq/L 99 100 99    CO2 19 - 32 mEq/L 33(H) 31 31  Calcium 8.4 - 10.5 mg/dL 9.5 10.1 9.2     CBC Latest Ref Rng 06/17/2014 01/07/2013 03/29/2012  WBC 4.0 - 10.5 K/uL 10.7(H) 8.5 9.0  Hemoglobin 13.0 - 17.0 g/dL 14.0 14.0 13.6  Hematocrit 39.0 - 52.0 % 41.8 41.2 40.3  Platelets 150 - 400 K/uL 328 250 296.0     No results for input(s): TROPONINI in the last 72 hours.  Invalid input(s): CK, MB  Hepatic Function Panel No results for input(s): PROT, ALBUMIN, AST, ALT, ALKPHOS, BILITOT, BILIDIR, IBILI in the last 72 hours. No results for input(s): INR in the last 72 hours. BNP (last 3 results) No results for input(s): BNP in the last 8760 hours.  ProBNP (last 3 results) No results for input(s): PROBNP in the last 8760 hours.   Lipid Panel     Component Value Date/Time   CHOL 163 03/29/2012 1138   TRIG 75.0 03/29/2012 1138   HDL 54.80 03/29/2012 1138   CHOLHDL 3 03/29/2012 1138   VLDL 15.0 03/29/2012 1138   LDLCALC 93 03/29/2012 1138   LDLDIRECT 98.3 07/21/2006 1521      Imaging:  No results found.    Assessment/Plan:  Active Problems:   Left ventricular dysfunction   Abnormal nuclear stress test   Ischemic cardiomyopathy   Ischemic heart disease   Complete heart block   Chronic systolic dysfunction of left ventricle  Reviewed cath finding per Dr.Berry and discussed with wife and patient. Stable post cath. Seen by Dr. Rayann Heman; possible device upgrade in future. For dc today; F/U Dr. Emilio Math, MD, [facility] 06/21/2014, 12:07 PM

## 2014-06-21 NOTE — Discharge Summary (Signed)
Discharge Summary   Patient ID: Ian [name] MRN: [id number], DOB/AGE: 1928/11/12 81 y.o. Admit date: 06/20/2014 D/C date:     06/21/2014  Primary Care Provider: Viviana Simpler, MD Primary Cardiologist: Gwenlyn Found   Primary Discharge Diagnoses:  1. Recently diagnosed LV dysfunction/chronic systolic CHF EF 41-74% 2. CAD - h/o CABG with LIMA-LAD in 2001 with AVR; BMS to LCx 8/08 - cath this admission done for worsening EF/abnormal nuc: patent LIMA to LAD, patent dominant right, mild LCx disease, patent stent, recommended for medical management 3. Aortic stenosis s/p porcine AVR 2001 4. HTN 5. Renal artery stenosis - 90% RRA stenosis previously; duplex 09/2013: R renal artery 60-99%, L renal artery 1-59% - f/u due 09/2014 6. Parkinson's disease  Secondary Discharge Diagnoses:  1. Allergy 2. Diverticulitis 3. GERD 4. OA 5. Osteoporosis 6. MALT Lymphoma 7. TIA 8. Hyperlipidemia 9. Erectile dysfunction 10. Symptomatic bradycardia s/p Medtronic pacer 2013  Hospital Course: Mr. Witty is an 81 y/o M with history of CAD (CABG with LIMA-LAD in 2001 with AVR; BMS to LCx 8/08), HTN, right renal artery stenosis, symptomatic bradycardia s/p pacemaker 2013, progressive Parkinson's disease, TIA, HLD who presentened to [facility] yesterday for planned cardiac cath. He's had progressive shortness of breath or lower extremity edema as well as unexplained weight loss. He was placed on 40 mg of Lasix which resulted in some improvement in his edema and his dyspnea. A 2D echo 05/27/14 revealed a decline in his ejection fraction to 20-25% from 50-55% November 2014 for unclear reasons. [2D Echo 05/27/14 mild LVH, EF 20-25%, grade 1 DD, AV bioprosthesis present, mild AR, Valve area 1.17 (VTI)/1.16 (Vmax)/1.25(Vmean) - stable, mod LAE.] He underwent nuclear stress test 06/13/14 which was high risk with a large area of ischemia in the distribution of the LAD artery and a fixed defect in the inferior wall (in the  absence of gated images cannot distinguish scar from diaphragmatic attenuation). Dr. Gwenlyn Found recommended cardiac catheterization to further evaluate. This demonstrated:  1. Left main; normal  2. LAD; 80% proximal to mid, occluded distally 3. Left circumflex; nondominant with a 40-50% smooth ostial/proximal stenosis. The stent in the AV groove was widely patent.  4. Right coronary artery; dominant with minimal irregularities 5.LIMA TO LAD; widely patent 7. Left ventriculography; not performed   Dr. Gwenlyn Found did not perform a full right heart cath because he had atrial and ventricular leads in place. He did not think the etiology for his decline in LV function was ischemically mediated. Continued medical therapy will be recommended. EP was consulted for consideration of device upgrade. Given his advanced age, Dr. Rayann Heman did not feel that he is a candidate for ICD implant. He did however discuss the possibility of upgrade to a CRT-P device, including risks and benefits. At this time the patient is not convinced that his quality of life suffers enough to warrant this procedure. He would therefore prefer to continue medical management He could potentially receive clinical benefit from an ACE inhibitor, however given RAS, this will be deferred to Dr Gwenlyn Found. No medicine changes were made this admission. Note has h/o intolerance to pravastatin and zocor thus will defer decision regarding statin to primary cardiologist as well. He will continue to follow with Dr Gwenlyn Found.If he decides to reconsider device upgrade in the future, then Dr. Rayann Heman would be happy to revisit. The patient is feeling well post-cath. Dr. Claiborne Billings has seen and examined the patient today and feels he is stable for discharge. I have left  a message with our [facility] office's scheduler requesting a follow-up appointment, and our office will call the patient with this information.  Of note the patient has two doses listed of Carbidopa-levodopa on  their med list - I clarified with the patient this is in fact correct.  Discharge Vitals: Blood pressure 133/33, pulse 63, temperature 97.7 F (36.5 C), temperature source Oral, resp. rate 21, height 5\' 8"  (1.727 m), weight 150 lb 5.7 oz (68.2 kg), SpO2 97 %.  Labs: Lab Results  Component Value Date   WBC 10.7* 06/17/2014   HGB 14.0 06/17/2014   HCT 41.8 06/17/2014   MCV 90.9 06/17/2014   PLT 328 06/17/2014    Recent Labs Lab 06/17/14 1533  NA 140  K 4.5  CL 99  CO2 33*  BUN 19  CREATININE 0.89  CALCIUM 9.5  GLUCOSE 92    Lab Results  Component Value Date   CHOL 163 03/29/2012   HDL 54.80 03/29/2012   LDLCALC 93 03/29/2012   TRIG 75.0 03/29/2012     Diagnostic Studies/Procedures   Dg Chest 2 View  06/17/2014   CLINICAL DATA:  Preoperative evaluation for cardiac catheterization, known coronary artery disease and abnormal stress test  EXAM: CHEST  2 VIEW  COMPARISON:  01/07/2013  FINDINGS: Cardiac shadow is within normal limits. A pacing device is again seen and stable. Postsurgical changes are noted. No focal infiltrate or sizable effusion is seen. Chronic compression deformity is again noted at T12.  IMPRESSION: Chronic changes without acute abnormality.   Electronically Signed   By: [name] M.D.   On: 06/17/2014 16:33   Cardiac catheterization this admission, please see full report and above for summary.   Discharge Medications   Current Discharge Medication List    CONTINUE these medications which have NOT CHANGED   Details  aspirin 81 MG tablet Take 81 mg by mouth daily.    bisoprolol-hydrochlorothiazide (ZIAC) 5-6.25 MG per tablet Take 1 tablet by mouth daily.     carbidopa-levodopa (SINEMET CR) 50-200 MG per tablet Take 1 tablet by mouth at bedtime.     carbidopa-levodopa (SINEMET IR) 25-100 MG per tablet Take 2 tablets by mouth 5 (five) times daily.    furosemide (LASIX) 40 MG tablet Take 1 tablet (40 mg total) by mouth daily.     lansoprazole  (PREVACID) 15 MG capsule Take 15 mg by mouth daily as needed.    polyethylene glycol (MIRALAX / GLYCOLAX) packet Take 17 g by mouth daily.    potassium chloride (K-DUR) 10 MEQ tablet Take 1 tablet (10 mEq total) by mouth 2 (two) times daily.     sildenafil (VIAGRA) 50 MG tablet Take 0.5 tablets (25 mg total) by mouth as needed for erectile dysfunction.     vitamin B-12 (CYANOCOBALAMIN) 1000 MCG tablet Take 1,000 mcg by mouth daily.        Disposition   The patient will be discharged in stable condition to home. Discharge Instructions    Diet - low sodium heart healthy    Complete by:  As directed      Increase activity slowly    Complete by:  As directed   No driving for 2 days (unless previously instructed by your doctor not to drive for other reasons as well). No lifting over 5 lbs for 1 week. No sexual activity for 1 week. Keep procedure site clean & dry. If you notice increased pain, swelling, bleeding or pus, call/return!  You may shower, but no soaking  baths/hot tubs/pools for 1 week.          Follow-up Information    Follow up with Lorretta Harp, MD.   Specialty:  Cardiology   Why:  Office will call you for your followup appointment. Call office if you have not heard back in 3 days.   Contact information:   [address] [place] [address] [phone]         Duration of Discharge Encounter: Greater than 30 minutes including physician and PA time.  Signed, Brendolyn Stockley PA-C 06/21/2014, 1:17 PM

## 2014-06-21 NOTE — Progress Notes (Signed)
Utilization Review Completed.Donne Anon T3/07/2014

## 2014-06-24 ENCOUNTER — Encounter: Payer: Self-pay | Admitting: *Deleted

## 2014-07-10 ENCOUNTER — Ambulatory Visit: Payer: Medicare Other | Admitting: Cardiovascular Disease

## 2014-07-15 ENCOUNTER — Encounter: Payer: Self-pay | Admitting: Cardiology

## 2014-07-15 ENCOUNTER — Ambulatory Visit (INDEPENDENT_AMBULATORY_CARE_PROVIDER_SITE_OTHER): Payer: Medicare Other | Admitting: Cardiology

## 2014-07-15 VITALS — BP 122/62 | Ht 68.0 in | Wt 149.3 lb

## 2014-07-15 DIAGNOSIS — I701 Atherosclerosis of renal artery: Secondary | ICD-10-CM

## 2014-07-15 DIAGNOSIS — R079 Chest pain, unspecified: Secondary | ICD-10-CM

## 2014-07-15 MED ORDER — POTASSIUM CHLORIDE ER 10 MEQ PO TBCR: 10.0000 meq | EXTENDED_RELEASE_TABLET | Freq: Two times a day (BID) | ORAL | Status: DC

## 2014-07-15 MED ORDER — FUROSEMIDE 40 MG PO TABS: 40.0000 mg | ORAL_TABLET | Freq: Every day | ORAL | Status: DC

## 2014-07-15 NOTE — Progress Notes (Signed)
Cardiology Office Note   Date:  07/15/2014   ID:  [name], DOB Jun 19, 1928, MRN [id number]  PCP:  Viviana Simpler, MD  Cardiologist:  Dr. Gwenlyn Found    Chief Complaint  Patient presents with  . Hospitalization Follow-up    POST CATH, LITTLE DISCOMFORT, SWELLING , SOB      History of Present Illness: JESIEL GARATE is a 81 y.o. male who presents for follow up with new drop in EF to 20% range and ischemia in LAD territory.  He has been hospitalized for cardiac cath with results below and was seen by Dr. Rayann Heman for possible CRT therapy.  Dr. Rayann Heman did not think he was a candidate for ICD.   He continues to have chest pain with rapid stationary bike riding.   THis is only time with pain.   He has a history of coronary artery disease status post bypass grafting in 2001 with a LIMA to his LAD and a porcine valve at that time. He did have a bare metal stent placed to his circumflex coronary artery by Dr. Adrian Prows on December 05, 2007. At that time, his LIMA was patent and his RCA had only mild disease. Abdominal aortography revealed a widely patent left renal artery with a 90% right renal artery stenosis. His last Myoview performed in 2009 was nonischemic. An echo performed in 2011 revealed an aortic valve area of 0.9 cm squared with an EF of 45 to 50% and mild concentric LVH. His other problems include hypertension and hyperlipidemia. He does have progressive Parkinson's disease.  He has a permanent transvenous pacemaker followed by Dr. Sallyanne Kuster for bradycardia. He's had progressive shortness of breath or lower extremity edema as well as unexplained weight loss. He was placed on 40 mg of Lasix which resulted in some improvement in his edema and his dyspnea. He also has a 90% right renal artery stenosis by duplex ultrasound that has remained stable as recently as 09/26/13.  Pt with DOE and a 2-D echo performed 05/27/14 revealed a decline in his ejection fraction to 20-25% from 50-55% November 2014  for unclear reasons.  ANGIOGRAPHIC RESULTS:   1. Left main; normal  2. LAD; 80% proximal to mid, occluded distally 3. Left circumflex; nondominant with a 40-50% smooth ostial/proximal stenosis. The stent in the AV groove was widely patent.  4. Right coronary artery; dominant with minimal irregularities 5.LIMA TO LAD; widely patent 7. Left ventriculography; not performed today  IMPRESSION:Mr. Emberson has a patent LIMA to the LAD with a patent dominant right and mild circumflex disease. His RA pressure and RV pressures were low.he did not have full right heart cath because he had atrial and ventricular leads in place. Dr. Adora Fridge  did not think the etiology for his decline in LV function was ischemically mediated. Continue medical therapy was recommended.   Today only chest pain is with riding stationary bike very fast.  We discussed adding ranexa, but pt does not wish to do this currently.  We also discussed CRT - Dr. Rayann Heman saw the pt in the hospital.  No ICD but CRT therapy with up grade to PPM would be appropriate.  I reviewed with pt and family again.  I gave a pamphlet to explain how it works.     Past Medical History  Diagnosis Date  . CAD (coronary artery disease) CABG x1 2001; PCI 2008    a. s/p CABG with LIMA-LAD in 2001 with AVR. b. BMS to LCx 8/08. c. Abnl  nuc/dec EF 05/2014 - s/p cath with patent LIMA to LAD, patent dominant right, mild LCx disease, patent stent. Med rx.  . Allergy   . Diverticulitis   . GERD (gastroesophageal reflux disease)   . Hypertension   . Osteoarthritis   . Osteoporosis   . MALT lymphoma   . Parkinson's disease   . TIA (transient ischemic attack) 06/08    TIA  . Hyperlipidemia   . ED (erectile dysfunction)   . Aortic valve stenosis, acquired 2001    a. s/p Porcine AVR 2001.   Marland Kitchen Renal artery stenosis     a. 90% RRA stenosis previously. b. Duplex 09/2013: R renal artery 60-99%, L renal artery 1-59% - f/u due 09/2014.  . S/P cardiac pacemaker  procedure, Medtronic Adapta L  ADDRL1, 01/21/12 01/22/2012  . Symptomatic bradycardia     a. s/p Medtronic pacer 2013.  Marland Kitchen Chronic systolic CHF (congestive heart failure)     a. EF 50-55% in 02/2013 -> EF 20-25% by 2D Echo 05/27/14, Dr. Gwenlyn Found did not think ischemically mediated based on cath 06/2014.    Past Surgical History  Procedure Laterality Date  . Coronary artery bypass graft  2001    Lucianne Lei trigt ) aortic valve replacement 2001; LIMA-LAD.  Marland Kitchen Esophagogastroduodenoscopy      multiple, Colon/ EGD benign 11/2004  . Pilonidal cyst / sinus excision  1953  . Inguinal hernia repair      LIH 1997Llap. bilateral hernias 1998  . Coronary stent placement  11/2007    8/09  Left Circumflex Stent by Dr Toy Care started and aggrenox stopped  . Cataract extraction  06/09    left  . Laparoscopic cholecystectomy  07/11    Dr.Rosenbower  . Squamous cell carcinoma excision  3/13    back  . Aortic valve replacement  2001    Porcine valve  . Pacemaker insertion  01/21/2012    Medtronic Adapta L implanted for complete heart block  . Nm myocar perf wall motion  11/22/2007    mild septal ischemia,EF 66%  . Permanent pacemaker insertion N/A 01/21/2012    Procedure: PERMANENT PACEMAKER INSERTION;  Surgeon: Thompson Grayer, MD;  Location: [facility] C[facility] LAB;  Service: Cardiovascular;  Laterality: N/A;  . Cardiac catheterization  06/20/2014    Procedure: CORONARY/GRAFT ANGIOGRAPHY;  Surgeon: Lorretta Harp, MD;  Location: [facility] CATH LAB;  Service: Cardiovascular;;     Current Outpatient Prescriptions  Medication Sig Dispense Refill  . aspirin 81 MG tablet Take 81 mg by mouth daily.    . bisoprolol-hydrochlorothiazide (ZIAC) 5-6.25 MG per tablet Take 1 tablet by mouth daily. 90 tablet 3  . carbidopa-levodopa (SINEMET CR) 50-200 MG per tablet Take 1 tablet by mouth at bedtime. 90 tablet 1  . carbidopa-levodopa (SINEMET IR) 25-100 MG per tablet Take 2 tablets by mouth 5 (five) times daily.    . furosemide (LASIX) 40  MG tablet Take 1 tablet (40 mg total) by mouth daily. 90 tablet 3  . lansoprazole (PREVACID) 15 MG capsule Take 15 mg by mouth daily as needed.    . polyethylene glycol (MIRALAX / GLYCOLAX) packet Take 17 g by mouth daily.    . potassium chloride (K-DUR) 10 MEQ tablet Take 1 tablet (10 mEq total) by mouth 2 (two) times daily. 180 tablet 3  . sildenafil (VIAGRA) 50 MG tablet Take 0.5 tablets (25 mg total) by mouth as needed for erectile dysfunction. 6 tablet 0  . vitamin B-12 (CYANOCOBALAMIN) 1000 MCG tablet Take 1,000 mcg  by mouth daily.     No current facility-administered medications for this visit.    Allergies:   Plavix; Pravastatin; Zocor; and Penicillins    Social History:  The patient  reports that he quit smoking about 51 years ago. His smoking use included Cigarettes. He has a 5 pack-year smoking history. He has never used smokeless tobacco. He reports that he does not drink alcohol or use illicit drugs.   Family History:  The patient's family history includes Asthma in his father; Heart disease in his brother.    ROS:  General:no colds or fevers, no weight changes Skin:no rashes or ulcers HEENT:no blurred vision, no congestion CV:see HPI PUL:see HPI GI:no diarrhea constipation or melena, no indigestion GU:no hematuria, no dysuria MS:no joint pain, no claudication Neuro:no syncope, no lightheadedness Endo:no diabetes, no thyroid disease  Wt Readings from Last 3 Encounters:  07/15/14 149 lb 4.8 oz (67.722 kg)  06/20/14 150 lb 5.7 oz (68.2 kg)  06/17/14 150 lb 11.2 oz (68.357 kg)     PHYSICAL EXAM: VS:  BP 122/62 mmHg  Ht 5\' 8"  (1.727 m)  Wt 149 lb 4.8 oz (67.722 kg)  BMI 22.71 kg/m2 , BMI Body mass index is 22.71 kg/(m^2). General:Pleasant affect, NAD Skin:Warm and dry, brisk capillary refill HEENT:normocephalic, sclera clear, mucus membranes moist Neck:supple, no JVD, no bruits  Heart:S1S2 RRR without murmur, gallup, rub or click Lungs:clear without rales,  rhonchi, or wheezes NUU:VOZD, non tender, + BS, do not palpate liver spleen or masses Ext:no lower ext edema, 2+ pedal pulses, 2+ radial pulses Neuro:alert and oriented X 3, MAE, follows commands, + facial symmetry    EKG:  EKG is ordered today. The ekg ordered today demonstrates AV pacing with PVCs.   Recent Labs: 09/05/2013: ALT 10 06/17/2014: BUN 19; Creatinine 0.89; Hemoglobin 14.0; Platelets 328; Potassium 4.5; Sodium 140; TSH 4.349    Lipid Panel    Component Value Date/Time   CHOL 163 03/29/2012 1138   TRIG 75.0 03/29/2012 1138   HDL 54.80 03/29/2012 1138   CHOLHDL 3 03/29/2012 1138   VLDL 15.0 03/29/2012 1138   LDLCALC 93 03/29/2012 1138   LDLDIRECT 98.3 07/21/2006 1521       Other studies Reviewed: Additional studies/ records that were reviewed today include: cath notes, discharge, labs.   ASSESSMENT AND PLAN:  1. Recently diagnosed LV dysfunction/chronic systolic CHF EF 66-44% reviewed CRT therapy pt and his wife are thinking about this.  currently euvolemic.he will keep follow up with Dr. Jerilynn Mages. Croitoru and then Dr. Adora Fridge.   2. CAD - h/o CABG with LIMA-LAD in 2001 with AVR; BMS to LCx 8/08 - cath this admission done for worsening EF/abnormal nuc: patent LIMA to LAD, patent dominant right, mild LCx disease, patent stent, recommended for medical management - still with chest pain with strenuous stationary bike riding.  Pt offered ranexa,, he prefers to hold this for now, but if increased pain he will call and we will provide samples to see if it prevents pain --pt uses viagra, reminded no NTG for 72 hours after or before viagra.     3. Aortic stenosis s/p porcine AVR 2001  4. HTN controlled  5. Renal artery stenosis - 90% RRA stenosis previously; duplex 09/2013: R renal artery 60-99%, L renal artery 1-59% - f/u due 09/2014  6. Parkinson's disease to see Dr. Carles Collet   Current medicines are reviewed with the patient today.  The patient Has no concerns regarding  medicines.  The following changes have  been made:  See above Labs/ tests ordered today include:see above  Disposition:   FU:  see above  Lennie Muckle, NP  07/15/2014 9:43 AM    [facility] [place], [place], [address] [place], [place] Phone: [phone]; Fax: [phone]

## 2014-07-15 NOTE — Patient Instructions (Addendum)
If your chest pain with bike riding becomes worse let us know and we will provide samples of Ranexa.  If you decide to proceed with CRT therapy, adding a new lead to pacer, let us know and we will schedule appt with Dr. Rayann Heman.   Weigh daily Call [phone] if weight climbs more than 3 pounds in a day or 5 pounds in a week. No salt to very little salt in your diet.  No more than 2000 mg in a day. Call if increased shortness of breath or increased swelling.   Your physician recommends that you schedule a follow-up appointment in: July or Aug. With Dr.Berry

## 2014-07-17 ENCOUNTER — Telehealth: Payer: Self-pay | Admitting: Cardiology

## 2014-07-17 DIAGNOSIS — R931 Abnormal findings on diagnostic imaging of heart and coronary circulation: Secondary | ICD-10-CM

## 2014-07-17 DIAGNOSIS — I5022 Chronic systolic (congestive) heart failure: Secondary | ICD-10-CM

## 2014-07-17 DIAGNOSIS — I519 Heart disease, unspecified: Secondary | ICD-10-CM

## 2014-07-17 NOTE — Telephone Encounter (Signed)
Pt saw Cecilie Kicks on Monday. She told him,if he had any questions to please call. He does have questions,especially about the CRT.

## 2014-07-17 NOTE — Telephone Encounter (Signed)
Spoke to wife. She states that she and the patient have several question in regards to the CRT therapy. 1)will CRT increase patient's EF%? 2)IWill patient need a CRT-D or CRT-P? 3)What is patient scale "A-D"? 4) What would  be the outcome with proceeding with CRT? 5)What is an "ICD" and would the patient need one? RN was able to answer some questions,but recommend wife and patient make an appointment with Dr Rayann Heman. Dr. Rayann Heman would be the physician to do the procedure. Wife spoke with patient. Patient is in agreement to make an appointment. RN informed wife will make appointment and notify Cecilie Kicks NP

## 2014-07-17 NOTE — Telephone Encounter (Signed)
Sounds great, then if they agree to proceed, Dr Rayann Heman will arrange.  thanks

## 2014-08-05 ENCOUNTER — Ambulatory Visit (INDEPENDENT_AMBULATORY_CARE_PROVIDER_SITE_OTHER): Payer: Medicare Other | Admitting: Internal Medicine

## 2014-08-05 ENCOUNTER — Encounter: Payer: Self-pay | Admitting: Internal Medicine

## 2014-08-05 VITALS — BP 118/52 | HR 86 | Ht 68.0 in | Wt 150.6 lb

## 2014-08-05 DIAGNOSIS — I519 Heart disease, unspecified: Secondary | ICD-10-CM | POA: Diagnosis not present

## 2014-08-05 DIAGNOSIS — I442 Atrioventricular block, complete: Secondary | ICD-10-CM

## 2014-08-05 DIAGNOSIS — I701 Atherosclerosis of renal artery: Secondary | ICD-10-CM | POA: Diagnosis not present

## 2014-08-05 NOTE — Patient Instructions (Signed)
Your physician wants you to follow-up in: 6 months with Dr. Allred. You will receive a reminder letter in the mail two months in advance. If you don't receive a letter, please call our office to schedule the follow-up appointment.  

## 2014-08-06 ENCOUNTER — Ambulatory Visit (INDEPENDENT_AMBULATORY_CARE_PROVIDER_SITE_OTHER): Payer: Medicare Other | Admitting: Neurology

## 2014-08-06 ENCOUNTER — Encounter: Payer: Self-pay | Admitting: Neurology

## 2014-08-06 VITALS — BP 118/70 | HR 88 | Ht 68.0 in | Wt 149.0 lb

## 2014-08-06 DIAGNOSIS — G20A1 Parkinson's disease without dyskinesia, without mention of fluctuations: Secondary | ICD-10-CM

## 2014-08-06 DIAGNOSIS — G20 Parkinson's disease: Secondary | ICD-10-CM

## 2014-08-06 DIAGNOSIS — G3184 Mild cognitive impairment, so stated: Secondary | ICD-10-CM

## 2014-08-06 DIAGNOSIS — I701 Atherosclerosis of renal artery: Secondary | ICD-10-CM | POA: Diagnosis not present

## 2014-08-06 DIAGNOSIS — E538 Deficiency of other specified B group vitamins: Secondary | ICD-10-CM

## 2014-08-06 NOTE — Progress Notes (Signed)
Ian [name] was seen today in the movement disorders clinic for f/u.  He is accompanied by his wife who supplements the history.  The first symptom(s) the patient noticed was tremor in the R hand when he would use the hand or have a cup of coffee in it.  His wife believes that this began in 2004.  He was referred to Dr. Tilden Dome.  He tried some medication and according to his wife, he kept having adverse SE.  He was on mirapex and sinemet but reported dizziness and bad dreams with both.   He was referred to Dr. Yevonne Pax in about 2010 because he was interested in DBS but the patient was concerned about risks of the surgery and potential side effects.    09/28/12  I tried to change the levodopa back to the IR formulation but he did not like it because he was too drowsy and went back to the CR formulation.  , 2 po qid and carbidopa/levodopa 50/200 at night.    He has continued to have more tremor after the artane was d/c.  However, this has become less bothersome for him over time.  He does notice that sometimes the medication wears off quickly and sometimes it does not.  It seems inconsistent.  03/06/13 update:  The patient is accompanied by his wife who supplements the history.  Last visit, we tried to add entacapone to each of the 4 carbidopa/levodopa dosing times.  He called me because of nausea and we decreased it to twice a day dosing.  He continued to have nausea and the medication was discontinued.  The patient states that the medication makes his arms feel "heavy." He has stopped the carbidopa/levodopa 50/200 at night as he does not think it was beneficial.  Sometimes, he will wake up in the middle of the night and take an extra of the carbidopa/levodopa CR 25 100.  He and his wife have noted some jerking spells at night.  He sleeps very restless. I did review notes in his chart since last visit.  He went to the emergency room on September 21 with a sensation of palpitations and tachycardia.  His  pacemaker was interrogated and there were no problems and he was subsequently sent home.  06/11/13 update:  This patient is accompanied in the office by his spouse who supplements the history.  Pt is on carbidopa/levodopa 25/100 CR - 2 in the AM and 6 other dosages throughout the day.  When he awakens in the middle of the night he may take a few extra dosages and he estimates he takes a total daily levodopa dose of 1000-1200 mg per day.  He has been very senstive to medication and has not been able to tolerate the immediate release carbidopa/levodopa, entacapone or klonopin (made arms/legs hurt).  Tremor feels like it is getting worse.  Vision is getting worse but he went to the eye doctor and his vision was stable.  He feels like the levodopa only lasts 1.5-2 hrs.  No falls.  Is exercising.    08/13/13 update:   This patient is accompanied in the office by his spouse who supplements the history.  Pt is on carbidopa/levodopa 25/100, and takes approximately 8-10 tablets per day.  Last visit, I also added amantadine to see if that would help the tremor. He does think that has helped.  He presents today to try and change to rytary to see if it would last longer.  He does not wish to  try duopa.  He denies hallucinations, lightheadedness, falls, syncope or dyskinesia.     10/01/13 update:  Last visit, I changed the patient to rytary.  It turned out to be very expensive as the patient had no RX coverage, which I did not realize.  When he changed back, the IR formulation was accidentally called in instead of the CR prepartation (and he didn't tolerate the IR in the past).  The patient also didn't notice that and he isn't even sure now what he is taking but knows he is taking 2 po qid of either the CR or IR.  He is having hallucinations.  He is having swelling in the feet and legs as well.   They saw cardiology last week and were started on lasix and they didn't think that it made a difference.  They are worried  about a discoloration in legs.  He has lost weight.  Had one fall on May 10.  Walking into bedroom, got lightheaded and hit his back on the dresser.  He didn't think that he hit his head at the time but now that they think that he did as he has had hallucinations since.  However, looking back at our notes, it was 5/7 when he called here to d/c the rytary and changed to the IR levodopa it was 5/10 when he fell so timing was likely just coincidental as he had likely just changed back to the IR preparation.  11/12/13 update:  The patient was seen back in followup in the neurology clinic, accompanied by his wife who helps to supplement the history.  Patient has a history of Parkinson's disease.  Last visit, I changed his levodopa back to the CR preparation to see if that would help his hallucinations.  He is currently on 2 tablets by mouth 4 times per day (8am/12/4pm/8pm).  Today, the patient states that hallucinations continue.  He states that he continues to see people.   Medication helps tremor when he first takes it and then the tremor comes back before the next dose.   Last visit, I told him to discontinue his amantadine because of complaints of swelling.  I did review records since our last visit.  Because he noticed no changes in swelling after discontinuation of the amantadine, he followed up with his primary care physician.  His primary care physician felt that the swelling was likely due to venous insufficiency and he recommended that the patient restart the amantadine and he has.  The patient does state that he doesn't think that he had any break between d/c the IR carbidopa/levodopa 25/100, starting the CR version and when he restarted the amantadine.   The patient did see his cardiologist and he saw no cardiac reason for the edema.  He did not want to increase the patient's Lasix further because of complaints of orthostasis and dizziness.  No significant etiology was determined for his weight loss  either.  01/07/14 update:  The patient was seen today in followup in the neurology clinic, accompanied by his wife who helps to supplement the history.  The patient has switched back on his own from the CR version of carbidopa/levodopa to the immediate release form.  He is on 2 po 5 times per day (8am and then every 3 hours).  He is complaining about more tremor and more balance issues.  No falls but near falls.  He is off of amantadine.  Hallucinations went away after it was d/c but tremor definitely picked up.  Leg swelling is better but not gone.  Some memory problems.  No driving since march, 2015.    05/06/14 update:  The patient is seen in f/u, accompanied by his wife who supplements the history.   He is on carbidopa/levodopa 25/100 CR, 2 po 5 times per day (8am and then every 3 hours). He continues to c/o increasing tremor.  States that he is having m. Cramping at nighttime.  His last dose is at 8pm and bedtime is at 10 pm and cramping will start waking him at 3:30 am. Asks about trying to take a combination of one CR and one IR and see how that works.   He states that his memory is "going downhill" although his wife is not quite as sure.  He doesn't drive and hasn't for a year but has not trouble with pills.    08/06/14 update:  The patient is following up today, accompanied by his wife who supplements the history.  Last visit, the patient wanted to try a combination of carbidopa levodopa 25/100 immediate release and extended release so that he was taking 1 pill of each 5 times per day; however he really never did that and is still taking a carbidopa/levodopa 25/100 IR, 2 po 5 times a day.  We did add a carbidopa/levodopa 50/200 at night to see if that would help with his cramping;  The cramping is better at night and he only seems to have it if he has swelling.  His wife mentions that memory is not quite as good as it used to be.  He has some difficulty remembering previous conversations and having word  finding difficulties.  It is not a major issue, but it is something she is noticing.  Much has happened in his other medical history since last visit.  I reviewed those records.  The patient had a nuclear stress test on 06/13/2014, demonstrating a high risk study with a large area of ischemia in the LAD artery distribution and a fixed defect in the inferior wall.  An echocardiogram demonstrated a left ventricular ejection fraction of approximately 20%, which was much decreased compared to his prior study of 50-55%.  A heart catheterization was subsequently scheduled and performed on 06/20/2014.  There was no evidence of ischemia to account for the dropped ejection fraction.  It was felt that he was likely not a candidate for an ICD and they talked about CRT and decided to hold on that.  They saw Dr. Rayann Heman yesterday.  Neuroimaging has previously been performed.  It is not available for my review today.  PREVIOUS MEDICATIONS: Sinemet (reported bad dreams/dizziness on regular formulation and same with Mirapex); requip;  on Sinemet CR currently, entacapone (felt dizzy and arms felt "heavy"); klonopin (made arms/legs hurt); artane; amantadine (hallucinations); rytary (costly and pt has no RX coverage)  ALLERGIES:   Allergies  Allergen Reactions  . Plavix [Clopidogrel]     Fatigue   . Pravastatin     Muscle aches  . Zocor [Simvastatin]     REACTION: dizziness  . Penicillins Rash    CURRENT MEDICATIONS:  Current Outpatient Prescriptions on File Prior to Visit  Medication Sig Dispense Refill  . aspirin 81 MG tablet Take 81 mg by mouth daily.    . bisoprolol-hydrochlorothiazide (ZIAC) 5-6.25 MG per tablet Take 1 tablet by mouth daily. 90 tablet 3  . carbidopa-levodopa (SINEMET CR) 50-200 MG per tablet Take 1 tablet by mouth at bedtime. 90 tablet 1  . carbidopa-levodopa (SINEMET IR)  25-100 MG per tablet Take 2 tablets by mouth 5 (five) times daily.    . furosemide (LASIX) 40 MG tablet Take 1  tablet (40 mg total) by mouth daily. 90 tablet 3  . lansoprazole (PREVACID) 15 MG capsule Take 15 mg by mouth daily as needed (stomach).     . polyethylene glycol (MIRALAX / GLYCOLAX) packet Take 17 g by mouth daily.    . potassium chloride (K-DUR) 10 MEQ tablet Take 1 tablet (10 mEq total) by mouth 2 (two) times daily. 180 tablet 3  . sildenafil (VIAGRA) 50 MG tablet Take 0.5 tablets (25 mg total) by mouth as needed for erectile dysfunction. (Patient taking differently: Take 25 mg by mouth daily as needed for erectile dysfunction. ) 6 tablet 0  . vitamin B-12 (CYANOCOBALAMIN) 1000 MCG tablet Take 1,000 mcg by mouth daily.     No current facility-administered medications on file prior to visit.    PAST MEDICAL HISTORY:   Past Medical History  Diagnosis Date  . CAD (coronary artery disease) CABG x1 2001; PCI 2008    a. s/p CABG with LIMA-LAD in 2001 with AVR. b. BMS to LCx 8/08. c. Abnl nuc/dec EF 05/2014 - s/p cath with patent LIMA to LAD, patent dominant right, mild LCx disease, patent stent. Med rx.  . Allergy   . Diverticulitis   . GERD (gastroesophageal reflux disease)   . Hypertension   . Osteoarthritis   . Osteoporosis   . MALT lymphoma   . Parkinson's disease   . TIA (transient ischemic attack) 06/08    TIA  . Hyperlipidemia   . ED (erectile dysfunction)   . Aortic valve stenosis, acquired 2001    a. s/p Porcine AVR 2001.   Marland Kitchen Renal artery stenosis     a. 90% RRA stenosis previously. b. Duplex 09/2013: R renal artery 60-99%, L renal artery 1-59% - f/u due 09/2014.  . S/P cardiac pacemaker procedure, Medtronic Adapta L  ADDRL1, 01/21/12 01/22/2012  . Symptomatic bradycardia     a. s/p Medtronic pacer 2013.  Marland Kitchen Chronic systolic CHF (congestive heart failure)     a. EF 50-55% in 02/2013 -> EF 20-25% by 2D Echo 05/27/14, Dr. Gwenlyn Found did not think ischemically mediated based on cath 06/2014.    PAST SURGICAL HISTORY:   Past Surgical History  Procedure Laterality Date  . Coronary artery  bypass graft  2001    Lucianne Lei trigt ) aortic valve replacement 2001; LIMA-LAD.  Marland Kitchen Esophagogastroduodenoscopy      multiple, Colon/ EGD benign 11/2004  . Pilonidal cyst / sinus excision  1953  . Inguinal hernia repair      LIH 1997Llap. bilateral hernias 1998  . Coronary stent placement  11/2007    8/09  Left Circumflex Stent by Dr Toy Care started and aggrenox stopped  . Cataract extraction  06/09    left  . Laparoscopic cholecystectomy  07/11    Dr.Rosenbower  . Squamous cell carcinoma excision  3/13    back  . Aortic valve replacement  2001    Porcine valve  . Pacemaker insertion  01/21/2012    Medtronic Adapta L implanted for complete heart block  . Nm myocar perf wall motion  11/22/2007    mild septal ischemia,EF 66%  . Permanent pacemaker insertion N/A 01/21/2012    Procedure: PERMANENT PACEMAKER INSERTION;  Surgeon: Thompson Grayer, MD;  Location: [facility] CATH LAB;  Service: Cardiovascular;  Laterality: N/A;  . Cardiac catheterization  06/20/2014    Procedure: CORONARY/GRAFT  ANGIOGRAPHY;  Surgeon: Lorretta Harp, MD;  Location: [facility] CATH LAB;  Service: Cardiovascular;;    SOCIAL HISTORY:   History   Social History  . Marital Status: Married    Spouse Name: N/A  . Number of Children: 3  . Years of Education: N/A   Occupational History  . retired- [employer]   . part-time sub/courier for schools- now only rarely    Social History Main Topics  . Smoking status: Former Smoker -- 1.00 packs/day for 5 years    Types: Cigarettes    Quit date: 06/06/1963  . Smokeless tobacco: Never Used  . Alcohol Use: No     Comment: very rare  . Drug Use: No  . Sexual Activity: No   Other Topics Concern  . Not on file   Social History Narrative   Has living will   Wife, then son [name], hold health care POA   Requests DNR --done   Requests no tube feeds if cognitively unaware    FAMILY HISTORY:   Family Status  Relation Status Death Age  . Mother Deceased 40    old age   . Father Deceased 51    acute bronchitis, cotton exposure  . Sister Deceased     trauma  . Brother Deceased     2, deceased  . Daughter Deceased 60    osteosarcoma  . Child Alive     3, healthy  . Brother Alive     healthy  . Sister Alive     healthy    ROS:    A complete 10 system review of systems was obtained and was unremarkable apart from what is mentioned above.  PHYSICAL EXAMINATION:    VITALS:   Filed Vitals:   08/06/14 1106  BP: 118/70  Pulse: 88  Height: 5\' 8"  (1.727 m)  Weight: 149 lb (67.586 kg)   Wt Readings from Last 3 Encounters:  08/06/14 149 lb (67.586 kg)  08/05/14 150 lb 9.6 oz (68.312 kg)  07/15/14 149 lb 4.8 oz (67.722 kg)     GEN:  The patient appears stated age and is in NAD. HEENT:  Normocephalic, atraumatic.  The mucous membranes are moist. The superficial temporal arteries are without ropiness or tenderness. CV:  RRR Lungs:  CTAB Neck/HEME:  There are no carotid bruits bilaterally.  Neurological examination:  Orientation: The patient is alert and oriented x3.  He is a very good historian today.  Movement examination: Tone: There is normal tone in the bilateral upper extremities today.  The tone in the lower extremities is normal.  Abnormal movements: There is a moderate resting tremor bilaterally, R>L.  No dyskinesia. Coordination:  There is miminal decremation with RAM's, seen mostly with hand opening and closing on the R Gait and Station: The patient has mild difficulty arising out of a deep-seated chair without the use of the hands. The patient's stride length is normal.  He has mild postural instability.    Lab Results  Component Value Date   WBC 10.7* 06/17/2014   HGB 14.0 06/17/2014   HCT 41.8 06/17/2014   MCV 90.9 06/17/2014   PLT 328 06/17/2014     Chemistry      Component Value Date/Time   NA 140 06/17/2014 1533   K 4.5 06/17/2014 1533   CL 99 06/17/2014 1533   CO2 33* 06/17/2014 1533   BUN 19 06/17/2014 1533    CREATININE 0.89 06/17/2014 1533   CREATININE 0.73 01/07/2013 0865  Component Value Date/Time   CALCIUM 9.5 06/17/2014 1533   ALKPHOS 116 09/05/2013 1329   AST 15 09/05/2013 1329   ALT 10 09/05/2013 1329   BILITOT 0.8 09/05/2013 1329     Lab Results  Component Value Date   VITAMINB12 660 03/06/2013   Lab Results  Component Value Date   TSH 4.349 06/17/2014     ASSESSMENT/PLAN:  1.  Idiopathic Parkinsons disease.    -We discussed the diagnosis as well as pathophysiology of the disease.  We discussed treatment options as well as prognostic indicators.  Patient education was provided.  -His hallucinations went away with discontinuation of amantadine.  -I. had a long discussion with the patient and wife about various options, which I think are somewhat limited.  He has significant tremor.  Tremor got worse with discontinuation of amantadine.  He is not a DBS candidate because of other medical problems.  His EF has significantly dropped recently as well.  In the end, he stated that he did not want to explore DBS.  I talked to him in detail about duopa.  We have talked about this before.  We talked about the benefits as well as the pitfalls of this therapy, but he does not think that he is interested in this either.  I do think that he is going to be left with this tremor and there is very little that we can do about it now, without affecting his cognition.  He understands this.  -He would like to try a combination of the carbidopa levodopa 25/100 IR and carbidopa/levodopa 25/100 CR so that he takes one tablet of each (therefore 2 tablets) 5 times a day.  I have no objection to him trying this.  I'm not sure it will make a huge difference.  We have tried multiple accommodations with him in the past, including Rytary without a big difference.  He will try this for one week and then he will also try carbidopa/levodopa 50/200 5 times per day for one week to see if that makes a difference and  then will call me to see which of the combinations worked best.  -Continue carbidopa/levodopa 50/200 at night for muscle cramping as that did seem to help. 2.  Mild cognitive impairment, likely from longstanding PD.  -I think this is from long-standing Parkinson's disease.  We talked about the acetylcholinesterase inhibitors.  He really is not interested in Exelon pills because of the risk of nausea.  We talked about the Exelon patches but they're quite expensive.  I think Aricept would work as well but he just doesn't want another med right now.   3.  visual hallucinations.  -result of amantadine and resolved after the discontinuation. 4.  B12 deficiency.  -He is on oral supplements and his last level was 660 when rechecked. 5.  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  -He could not tolerate klonopin. 6.  I. will see him back in the next few months, sooner should new neurologic issues arise.

## 2014-08-06 NOTE — Progress Notes (Signed)
Electrophysiology Office Note   Date:  08/06/2014   ID:  Ian [name], DOB 22-Jan-1929, MRN [id number]  PCP:  Viviana Simpler, MD  Cardiologist:  Dr Gwenlyn Found Primary Electrophysiologist: Thompson Grayer, MD    Chief Complaint  Patient presents with  . Appointment    Chronic systloic CHF     History of Present Illness: Ian [name] is a 81 y.o. male who presents today for electrophysiology evaluation.   He presents for further EP discussions about possible device upgrade.  See my initial consult note during his recent hospitalization.  He is doing well.  His primary concern is with tremors of parkinsons.  He has some fatigue and SOB but really is able to do most of what he desires.  His quality of life is preserved.   Today, he denies symptoms of palpitations, chest pain,  orthopnea, PND, lower extremity edema, claudication, dizziness, presyncope, syncope, or bleeding. The patient is tolerating medications without difficulties and is otherwise without complaint today.    Past Medical History  Diagnosis Date  . CAD (coronary artery disease) CABG x1 2001; PCI 2008    a. s/p CABG with LIMA-LAD in 2001 with AVR. b. BMS to LCx 8/08. c. Abnl nuc/dec EF 05/2014 - s/p cath with patent LIMA to LAD, patent dominant right, mild LCx disease, patent stent. Med rx.  . Allergy   . Diverticulitis   . GERD (gastroesophageal reflux disease)   . Hypertension   . Osteoarthritis   . Osteoporosis   . MALT lymphoma   . Parkinson's disease   . TIA (transient ischemic attack) 06/08    TIA  . Hyperlipidemia   . ED (erectile dysfunction)   . Aortic valve stenosis, acquired 2001    a. s/p Porcine AVR 2001.   Marland Kitchen Renal artery stenosis     a. 90% RRA stenosis previously. b. Duplex 09/2013: R renal artery 60-99%, L renal artery 1-59% - f/u due 09/2014.  . S/P cardiac pacemaker procedure, Medtronic Adapta L  ADDRL1, 01/21/12 01/22/2012  . Symptomatic bradycardia     a. s/p Medtronic pacer 2013.  Marland Kitchen Chronic  systolic CHF (congestive heart failure)     a. EF 50-55% in 02/2013 -> EF 20-25% by 2D Echo 05/27/14, Dr. Gwenlyn Found did not think ischemically mediated based on cath 06/2014.   Past Surgical History  Procedure Laterality Date  . Coronary artery bypass graft  2001    Lucianne Lei trigt ) aortic valve replacement 2001; LIMA-LAD.  Marland Kitchen Esophagogastroduodenoscopy      multiple, Colon/ EGD benign 11/2004  . Pilonidal cyst / sinus excision  1953  . Inguinal hernia repair      LIH 1997Llap. bilateral hernias 1998  . Coronary stent placement  11/2007    8/09  Left Circumflex Stent by Dr Toy Care started and aggrenox stopped  . Cataract extraction  06/09    left  . Laparoscopic cholecystectomy  07/11    Dr.Rosenbower  . Squamous cell carcinoma excision  3/13    back  . Aortic valve replacement  2001    Porcine valve  . Pacemaker insertion  01/21/2012    Medtronic Adapta L implanted for complete heart block  . Nm myocar perf wall motion  11/22/2007    mild septal ischemia,EF 66%  . Permanent pacemaker insertion N/A 01/21/2012    Procedure: PERMANENT PACEMAKER INSERTION;  Surgeon: Thompson Grayer, MD;  Location: [facility] CATH LAB;  Service: Cardiovascular;  Laterality: N/A;  . Cardiac catheterization  06/20/2014  Procedure: CORONARY/GRAFT ANGIOGRAPHY;  Surgeon: Lorretta Harp, MD;  Location: [facility] CATH LAB;  Service: Cardiovascular;;     Current Outpatient Prescriptions  Medication Sig Dispense Refill  . aspirin 81 MG tablet Take 81 mg by mouth daily.    . bisoprolol-hydrochlorothiazide (ZIAC) 5-6.25 MG per tablet Take 1 tablet by mouth daily. 90 tablet 3  . carbidopa-levodopa (SINEMET CR) 50-200 MG per tablet Take 1 tablet by mouth at bedtime. 90 tablet 1  . carbidopa-levodopa (SINEMET IR) 25-100 MG per tablet Take 2 tablets by mouth 5 (five) times daily.    . furosemide (LASIX) 40 MG tablet Take 1 tablet (40 mg total) by mouth daily. 90 tablet 3  . lansoprazole (PREVACID) 15 MG capsule Take 15 mg by mouth daily  as needed (stomach).     . polyethylene glycol (MIRALAX / GLYCOLAX) packet Take 17 g by mouth daily.    . potassium chloride (K-DUR) 10 MEQ tablet Take 1 tablet (10 mEq total) by mouth 2 (two) times daily. 180 tablet 3  . sildenafil (VIAGRA) 50 MG tablet Take 0.5 tablets (25 mg total) by mouth as needed for erectile dysfunction. (Patient taking differently: Take 25 mg by mouth daily as needed for erectile dysfunction. ) 6 tablet 0  . vitamin B-12 (CYANOCOBALAMIN) 1000 MCG tablet Take 1,000 mcg by mouth daily.     No current facility-administered medications for this visit.    Allergies:   Plavix; Pravastatin; Zocor; and Penicillins   Social History:  The patient  reports that he quit smoking about 51 years ago. His smoking use included Cigarettes. He has a 5 pack-year smoking history. He has never used smokeless tobacco. He reports that he does not drink alcohol or use illicit drugs.   Family History:  The patient's family history includes Asthma in his father; Heart disease in his brother.    ROS:  Please see the history of present illness.   All other systems are reviewed and negative.    PHYSICAL EXAM: VS:  BP 118/52 mmHg  Pulse 86  Ht 5\' 8"  (1.727 m)  Wt 150 lb 9.6 oz (68.312 kg)  BMI 22.90 kg/m2 , BMI Body mass index is 22.9 kg/(m^2). GEN: Well nourished, well developed, in no acute distress HEENT: normal Neck: no JVD, carotid bruits, or masses Cardiac: RRR (Paced) Respiratory:  clear to auscultation bilaterally, normal work of breathing GI: soft, nontender, nondistended, + BS MS: no deformity or atrophy Skin: warm and dry, device pocket is well healed Neuro:  Very prominent parkinsonian tremor Psych: euthymic mood, full affect   Device interrogation is reviewed today in detail.  See PaceArt for details.   Recent Labs: 09/05/2013: ALT 10 06/17/2014: BUN 19; Creatinine 0.89; Hemoglobin 14.0; Platelets 328; Potassium 4.5; Sodium 140; TSH 4.349    Lipid Panel       Component Value Date/Time   CHOL 163 03/29/2012 1138   TRIG 75.0 03/29/2012 1138   HDL 54.80 03/29/2012 1138   CHOLHDL 3 03/29/2012 1138   VLDL 15.0 03/29/2012 1138   LDLCALC 93 03/29/2012 1138   LDLDIRECT 98.3 07/21/2006 1521     Wt Readings from Last 3 Encounters:  08/06/14 149 lb (67.586 kg)  08/05/14 150 lb 9.6 oz (68.312 kg)  07/15/14 149 lb 4.8 oz (67.722 kg)      Other studies Reviewed: Additional studies/ records that were reviewed today include: My consult from recent hospitalization as well as Dr Kennon Holter notes and Dietrich Pates notes.    ASSESSMENT AND PLAN:  1.  Chronic systolic dysfunction Stable NYHA Class II symptoms Today, we again discussed upgrade of his pacemaker to a CRT device.  Given his advanced age, he is not a candidate for an ICD.  Presently, he would prefer to continue his current medicine strategy rather than take risks of device upgrade.  I think that this is a very prudent approach. He does V pace >90% but seems to be doing ok with this at this time.  I will see him back in 6 months for further discussions. He will continue to follow with Dr Gwenlyn Found for further medicine titration in the interim.  Army Fossa, MD  08/06/2014 9:14 PM     [address] [place] [address] [phone] (office) [phone] (fax)

## 2014-08-06 NOTE — Patient Instructions (Addendum)
1.  Things you can try:    -taking the 50/200 during the daytime so that you take one 5 times a day PLUS the nighttime pill  OR  - you can try ONE 25/100 immediate release and ONE 25/100 extended release five times a day and then the 50/200 at nighttime  LET ME KNOW HOW YOU END UP DOSING THIS Follow up in 4 months . Please call me in 1 week and let me know how the medication works .

## 2014-08-12 ENCOUNTER — Telehealth: Payer: Self-pay | Admitting: *Deleted

## 2014-08-12 NOTE — Telephone Encounter (Signed)
Well, that wasn't exactly what we wrote but glad he is doing better with the tremor and we will stick with that for now.

## 2014-08-12 NOTE — Telephone Encounter (Signed)
Patient is on new medication patient stats the new medication has not help sleeping but has help with the tremors Call back number [phone] Ian [name])

## 2014-08-12 NOTE — Telephone Encounter (Signed)
Spoke with the patient and he states he is taking Carbidopa Levodopa 50/200 and Carbidopa Levodopa 25/100 - 1 tablet of each (at the same time) 5 times daily. Then he takes 1 tablet of Carbidopa Levodopa 50/200 at night and states that tremor has improved. Please advise.

## 2014-08-13 NOTE — Telephone Encounter (Signed)
Spoke with patient and made him aware to continue medication dosages and call with any changes/problems.

## 2014-08-22 ENCOUNTER — Telehealth: Payer: Self-pay | Admitting: *Deleted

## 2014-08-22 MED ORDER — CARBIDOPA-LEVODOPA 25-100 MG PO TABS: 2.0000 | ORAL_TABLET | Freq: Every day | ORAL | Status: DC

## 2014-08-22 NOTE — Telephone Encounter (Signed)
Carbidopa Levodopa 25/100 - 2 tablets 5 times daily RX sent to [facility]. LMOM making patient's wife aware. To call with any questions.

## 2014-08-22 NOTE — Telephone Encounter (Signed)
Patients wife calling for a renewal on his carbidopa levodopa she states that he has increased to 50-200 5 times a day Call back number [phone])

## 2014-08-23 MED ORDER — CARBIDOPA-LEVODOPA ER 50-200 MG PO TBCR: 1.0000 | EXTENDED_RELEASE_TABLET | Freq: Every day | ORAL | Status: DC

## 2014-08-23 NOTE — Telephone Encounter (Signed)
Carbidopa Levodopa 50/200 - 1 tablet 5 times daily sent to [facility].

## 2014-08-23 NOTE — Addendum Note (Signed)
Addended byAnnamaria Helling on: 08/23/2014 10:17 AM   Modules accepted: Orders

## 2014-09-03 ENCOUNTER — Encounter: Payer: Self-pay | Admitting: Cardiovascular Disease

## 2014-09-03 ENCOUNTER — Ambulatory Visit (INDEPENDENT_AMBULATORY_CARE_PROVIDER_SITE_OTHER): Payer: Medicare Other | Admitting: Cardiovascular Disease

## 2014-09-03 VITALS — BP 122/68 | HR 72 | Ht 68.0 in | Wt 150.0 lb

## 2014-09-03 DIAGNOSIS — Z950 Presence of cardiac pacemaker: Secondary | ICD-10-CM

## 2014-09-03 DIAGNOSIS — I519 Heart disease, unspecified: Secondary | ICD-10-CM | POA: Diagnosis not present

## 2014-09-03 DIAGNOSIS — R001 Bradycardia, unspecified: Secondary | ICD-10-CM

## 2014-09-03 DIAGNOSIS — I472 Ventricular tachycardia: Secondary | ICD-10-CM

## 2014-09-03 DIAGNOSIS — I442 Atrioventricular block, complete: Secondary | ICD-10-CM | POA: Diagnosis not present

## 2014-09-03 DIAGNOSIS — I5022 Chronic systolic (congestive) heart failure: Secondary | ICD-10-CM

## 2014-09-03 DIAGNOSIS — I701 Atherosclerosis of renal artery: Secondary | ICD-10-CM | POA: Diagnosis not present

## 2014-09-03 DIAGNOSIS — I259 Chronic ischemic heart disease, unspecified: Secondary | ICD-10-CM | POA: Diagnosis not present

## 2014-09-03 DIAGNOSIS — I441 Atrioventricular block, second degree: Secondary | ICD-10-CM

## 2014-09-03 DIAGNOSIS — I4729 Other ventricular tachycardia: Secondary | ICD-10-CM

## 2014-09-03 DIAGNOSIS — I350 Nonrheumatic aortic (valve) stenosis: Secondary | ICD-10-CM

## 2014-09-03 MED ORDER — SILDENAFIL CITRATE 50 MG PO TABS: 25.0000 mg | ORAL_TABLET | ORAL | Status: DC | PRN

## 2014-09-03 NOTE — Patient Instructions (Addendum)
Your physician has requested that you have a stress echocardiogram at [facility]. For further information please visit [email or web address]. Please follow instruction sheet as given.    Remote monitoring is used to monitor your Pacemaker or ICD from home. This monitoring reduces the number of office visits required to check your device to one time per year. It allows [place] to monitor the functioning of your device to ensure it is working properly. You are scheduled for a device check from home on December 05, 2014. You may send your transmission at any time that day. If you have a wireless device, the transmission will be sent automatically. After your physician reviews your transmission, you will receive a postcard with your next transmission date.  Dr. Sallyanne Kuster recommends that you schedule a follow-up appointment in: One Year.

## 2014-09-03 NOTE — Progress Notes (Signed)
Patient ID: Ian [name], male   DOB: [birth year], 81 y.o.   MRN: [id number]      Cardiology Office Note   Date:  09/03/2014   ID:  Ian [name], DOB September 21, 1928, MRN [id number]  PCP:  Viviana Simpler, MD  Cardiologist:  Quay Burow, MD  Sanda Klein, MD   Chief Complaint  Patient presents with  . Annual Exam    pt denied chest pain, pt c/o tiredness      History of Present Illness: Ian [name] is a 81 y.o. male who presents for pacemaker check. He has complete heart block and is pacemaker dependent. His current device was implanted in 2013.   He was actually hospitalized in March for cardiac catheterization when he was discovered to have severely reduced left ventricular systolic function. Left heart catheterization showed 80% LAD stenosis but his LIMA to LAD was widely patent, the left circumflex had 40-50 percent smooth ostial stenosis and a patent mid vessel stent in the right coronary artery had minimal irregularities. Left ventriculography was not performed and the bioprosthetic aortic valve was not crossed. He was referred to discuss CRT upgrade with Dr. Thompson Grayer. Dr. Rayann Heman felt that he was not a candidate for ICD implant but did offer CRT-P, but the patient "is not convinced that his quality of life suffers enough to warrant this procedure". The procedure was therefore deferred at least for the time being.  Echocardiography shows relatively low gradients across his biological aortic valve prosthesis, but the possibility of "low gradient aortic stenosis" remains. His left ventricular systolic dysfunction is not clearly explained.  Pacemaker interrogation shows normal device function. He has 99.6% ventricular pacing. Notes that when he was examined in the hospital he did have AV conduction with a AV delay of greater than 400 ms. In the past we have tried to use MVP to minimize ventricular pacing, but he still had virtually 100% V pacing. A single episode of 12 beats  nonsustained ventricular tachycardia was recorded on April 23. The last for 7 seconds and was asymptomatic. He also has roughly 72% atrial pacing.  Mr. Champine has extensive cardiac problems including a dual-chamber permanent pacemaker for severe bradycardia secondary to 2:1 AV block, 15 years status post aortic valve replacement with a biological prosthesis and coronary disease(single-vessel LIMA to LAD in 2001, stents to the circumflex coronary artery in 2009). 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 The most recent study performed in November 2014 showed a mean gradient of 18 mm Hg and a calculated valve area of around 1.3 cm square. LVEF was 50-55%. He has an approximately 90% stenosis of the right renal artery that has been stable in severity for years. It was last evaluated in January of 2014.   Past Medical History  Diagnosis Date  . CAD (coronary artery disease) CABG x1 2001; PCI 2008    a. s/p CABG with LIMA-LAD in 2001 with AVR. b. BMS to LCx 8/08. c. Abnl nuc/dec EF 05/2014 - s/p cath with patent LIMA to LAD, patent dominant right, mild LCx disease, patent stent. Med rx.  . Allergy   . Diverticulitis   . GERD (gastroesophageal reflux disease)   . Hypertension   . Osteoarthritis   . Osteoporosis   . MALT lymphoma   . Parkinson's disease   . TIA (transient ischemic attack) 06/08    TIA  . Hyperlipidemia   . ED (erectile dysfunction)   . Aortic valve stenosis, acquired 2001  a. s/p Porcine AVR 2001.   Marland Kitchen Renal artery stenosis     a. 90% RRA stenosis previously. b. Duplex 09/2013: R renal artery 60-99%, L renal artery 1-59% - f/u due 09/2014.  . S/P cardiac pacemaker procedure, Medtronic Adapta L  ADDRL1, 01/21/12 01/22/2012  . Symptomatic bradycardia     a. s/p Medtronic pacer 2013.  Marland Kitchen Chronic systolic CHF (congestive heart failure)     a. EF 50-55% in 02/2013 -> EF 20-25% by 2D Echo 05/27/14, Dr. Gwenlyn Found did not think ischemically  mediated based on cath 06/2014.    Past Surgical History  Procedure Laterality Date  . Coronary artery bypass graft  2001    Lucianne Lei trigt ) aortic valve replacement 2001; LIMA-LAD.  Marland Kitchen Esophagogastroduodenoscopy      multiple, Colon/ EGD benign 11/2004  . Pilonidal cyst / sinus excision  1953  . Inguinal hernia repair      LIH 1997Llap. bilateral hernias 1998  . Coronary stent placement  11/2007    8/09  Left Circumflex Stent by Dr Toy Care started and aggrenox stopped  . Cataract extraction  06/09    left  . Laparoscopic cholecystectomy  07/11    Dr.Rosenbower  . Squamous cell carcinoma excision  3/13    back  . Aortic valve replacement  2001    Porcine valve  . Pacemaker insertion  01/21/2012    Medtronic Adapta L implanted for complete heart block  . Nm myocar perf wall motion  11/22/2007    mild septal ischemia,EF 66%  . Permanent pacemaker insertion N/A 01/21/2012    Procedure: PERMANENT PACEMAKER INSERTION;  Surgeon: Thompson Grayer, MD;  Location: [facility] CATH LAB;  Service: Cardiovascular;  Laterality: N/A;  . Cardiac catheterization  06/20/2014    Procedure: CORONARY/GRAFT ANGIOGRAPHY;  Surgeon: Lorretta Harp, MD;  Location: [facility] CATH LAB;  Service: Cardiovascular;;     Current Outpatient Prescriptions  Medication Sig Dispense Refill  . aspirin 81 MG tablet Take 81 mg by mouth daily.    . bisoprolol-hydrochlorothiazide (ZIAC) 5-6.25 MG per tablet Take 1 tablet by mouth daily. 90 tablet 3  . carbidopa-levodopa (SINEMET CR) 50-200 MG per tablet Take 1 tablet by mouth 5 (five) times daily. 450 tablet 1  . carbidopa-levodopa (SINEMET IR) 25-100 MG per tablet Take 2 tablets by mouth 5 (five) times daily. 900 tablet 1  . furosemide (LASIX) 40 MG tablet Take 1 tablet (40 mg total) by mouth daily. 90 tablet 3  . lansoprazole (PREVACID) 15 MG capsule Take 15 mg by mouth daily as needed (stomach).     . polyethylene glycol (MIRALAX / GLYCOLAX) packet Take 17 g by mouth daily.    .  potassium chloride (K-DUR) 10 MEQ tablet Take 1 tablet (10 mEq total) by mouth 2 (two) times daily. 180 tablet 3  . sildenafil (VIAGRA) 50 MG tablet Take 0.5 tablets (25 mg total) by mouth as needed for erectile dysfunction. 6 tablet 0  . vitamin B-12 (CYANOCOBALAMIN) 1000 MCG tablet Take 1,000 mcg by mouth daily.     No current facility-administered medications for this visit.    Allergies:   Plavix; Pravastatin; Zocor; and Penicillins    Social History:  The patient  reports that he quit smoking about 51 years ago. His smoking use included Cigarettes. He has a 5 pack-year smoking history. He has never used smokeless tobacco. He reports that he does not drink alcohol or use illicit drugs.   Family History:  The patient's family history includes Asthma  in his father; Heart disease in his brother.    ROS:  Please see the history of present illness.    Otherwise, review of systems positive for tremor due to parkinsonism, erectile dysfunction.   All other systems are reviewed and negative.    PHYSICAL EXAM: VS:  BP 122/68 mmHg  Pulse 72  Ht 5\' 8"  (1.727 m)  Wt 150 lb (68.04 kg)  BMI 22.81 kg/m2 , BMI Body mass index is 22.81 kg/(m^2).  General: Alert, oriented x3, no distress Head: no evidence of trauma, PERRL, EOMI, no exophtalmos or lid lag, no myxedema, no xanthelasma; normal ears, nose and oropharynx Neck: normal jugular venous pulsations and no hepatojugular reflux; brisk carotid pulses without delay and no carotid bruits Chest: clear to auscultation, no signs of consolidation by percussion or palpation, normal fremitus, symmetrical and full respiratory excursions Cardiovascular: normal position and quality of the apical impulse, regular rhythm, normal first and second heart sounds, no murmurs, rubs or gallops Abdomen: no tenderness or distention, no masses by palpation, no abnormal pulsatility or arterial bruits, normal bowel sounds, no hepatosplenomegaly Extremities: no clubbing,  cyanosis or edema; 2+ radial, ulnar and brachial pulses bilaterally; 2+ right femoral, posterior tibial and dorsalis pedis pulses; 2+ left femoral, posterior tibial and dorsalis pedis pulses; no subclavian or femoral bruits Neurological: grossly nonfocal Psych: euthymic mood, full affect   EKG:  EKG is not ordered today.   Recent Labs: 09/05/2013: ALT 10 06/17/2014: BUN 19; Creatinine 0.89; Hemoglobin 14.0; Platelets 328; Potassium 4.5; Sodium 140; TSH 4.349    Lipid Panel    Component Value Date/Time   CHOL 163 03/29/2012 1138   TRIG 75.0 03/29/2012 1138   HDL 54.80 03/29/2012 1138   CHOLHDL 3 03/29/2012 1138   VLDL 15.0 03/29/2012 1138   LDLCALC 93 03/29/2012 1138   LDLDIRECT 98.3 07/21/2006 1521      Wt Readings from Last 3 Encounters:  09/03/14 150 lb (68.04 kg)  08/06/14 149 lb (67.586 kg)  08/05/14 150 lb 9.6 oz (68.312 kg)      ASSESSMENT AND PLAN:  1.  High-grade AV block / Normal dual-chamber permanent pacemaker function with virtually 100% ventricular pacing due to periods of complete heart block and extremely long AV conduction times. CRT-P has been offered but the patient has declined for the time being. Functional status is limited more by Parkinson's disease.  2. CAD status post single-vessel LIMA to LAD, widely patent by recent cardiac catheterization. No angina.  3. Congestive heart failure due to severe left ventricular systolic dysfunction of uncertain etiology. Discussed with Dr. Gwenlyn Found. Will order dobutamine echo for possible low gradient severe aortic stenosis. He appears euvolemic today. He is on beta blockers, but is not receiving ace inhibitors.  4. Status post aortic valve replacement with a biological prosthesis 2001  5. Erectile dysfunction - he asked for a refill on his sildenafil prescription which I provided. Reinforced the risks of potential fatal hypotension if he has simultaneous administration of nitrates.  6. Renal artery stenosis - he is  scheduled for repeat ultrasonography next month and follow-up with Dr. Gwenlyn Found. His blood pressure is excellent. We'll ask Dr. Gwenlyn Found to address whether or not he should receive ace inhibitors in view of depressed left ventricular systolic function   Current medicines are reviewed at length with the patient today.  The patient does not have concerns regarding medicines.  The following changes have been made:  no change  Labs/ tests ordered today include:  Orders Placed This  Encounter  Procedures  . ECHO STRESS TEST   Patient Instructions  Your physician has requested that you have a stress echocardiogram at [facility]. For further information please visit [email or web address]. Please follow instruction sheet as given.    Remote monitoring is used to monitor your Pacemaker or ICD from home. This monitoring reduces the number of office visits required to check your device to one time per year. It allows [place] to monitor the functioning of your device to ensure it is working properly. You are scheduled for a device check from home on December 05, 2014. You may send your transmission at any time that day. If you have a wireless device, the transmission will be sent automatically. After your physician reviews your transmission, you will receive a postcard with your next transmission date.  Dr. Sallyanne Kuster recommends that you schedule a follow-up appointment in: One Year.       Mikael Spray, MD  09/03/2014 3:36 PM    Sanda Klein, MD, [facility] [phone] office [phone] pager

## 2014-09-18 ENCOUNTER — Telehealth (HOSPITAL_COMMUNITY): Payer: Self-pay | Admitting: *Deleted

## 2014-09-18 NOTE — Telephone Encounter (Signed)
Left message on voicemail in reference to upcoming appointment scheduled for 09/20/14. Phone number given for a call back so details instructions can be given. Hubbard Robinson, RN

## 2014-09-20 ENCOUNTER — Ambulatory Visit (HOSPITAL_BASED_OUTPATIENT_CLINIC_OR_DEPARTMENT_OTHER): Payer: Medicare Other

## 2014-09-20 ENCOUNTER — Ambulatory Visit (HOSPITAL_COMMUNITY): Payer: Medicare Other | Attending: Internal Medicine

## 2014-09-20 DIAGNOSIS — Z953 Presence of xenogenic heart valve: Secondary | ICD-10-CM | POA: Insufficient documentation

## 2014-09-20 DIAGNOSIS — I350 Nonrheumatic aortic (valve) stenosis: Secondary | ICD-10-CM | POA: Insufficient documentation

## 2014-09-20 MED ORDER — DOBUTAMINE INFUSION FOR EP/ECHO/NUC (1000 MCG/ML)
5.0000 ug/kg/min | INTRAVENOUS | Status: DC
  Administered 2014-09-20: 10 ug/kg/min via INTRAVENOUS

## 2014-09-23 DIAGNOSIS — H6121 Impacted cerumen, right ear: Secondary | ICD-10-CM | POA: Diagnosis not present

## 2014-09-23 LAB — CUP PACEART INCLINIC DEVICE CHECK
Battery Remaining Longevity: 115 mo
Brady Statistic AS VP Percent: 26 %
Brady Statistic AS VS Percent: 0 %
Lead Channel Impedance Value: 485 Ohm
Lead Channel Impedance Value: 531 Ohm
Lead Channel Pacing Threshold Amplitude: 0.75 V
Lead Channel Pacing Threshold Amplitude: 1.125 V
Lead Channel Pacing Threshold Pulse Width: 0.4 ms
Lead Channel Sensing Intrinsic Amplitude: 2.8 mV
Lead Channel Setting Pacing Amplitude: 2 V
Lead Channel Setting Pacing Pulse Width: 0.4 ms
Lead Channel Setting Sensing Sensitivity: 2.8 mV
MDC IDC MSMT BATTERY IMPEDANCE: 160 Ohm
MDC IDC MSMT BATTERY VOLTAGE: 2.79 V
MDC IDC MSMT LEADCHNL RV PACING THRESHOLD PULSEWIDTH: 0.4 ms
MDC IDC SESS DTM: 20160517120350
MDC IDC SET LEADCHNL RV PACING AMPLITUDE: 2.5 V
MDC IDC STAT BRADY AP VP PERCENT: 72 %
MDC IDC STAT BRADY AP VS PERCENT: 1 %

## 2014-09-23 LAB — ECHOCARDIOGRAM STRESS TEST
CHL CUP STRESS STAGE 1 HR: 72 {beats}/min
CHL CUP STRESS STAGE 1 SPEED: 0 mph
CHL CUP STRESS STAGE 2 GRADE: 0 %
CHL CUP STRESS STAGE 3 GRADE: 0 %
CHL CUP STRESS STAGE 3 SPEED: 0 mph
CHL CUP STRESS STAGE 5 HR: 82 {beats}/min
CHL CUP STRESS STAGE 5 SPEED: 0 mph
CHL CUP STRESS STAGE 6 DBP: 62 mmHg
CHL CUP STRESS STAGE 6 GRADE: 0 %
CHL CUP STRESS STAGE 6 HR: 83 {beats}/min
CHL CUP STRESS STAGE 6 SBP: 137 mmHg
CHL CUP STRESS STAGE 7 HR: 66 {beats}/min
CHL CUP STRESS STAGE 7 SBP: 132 mmHg
CHL CUP STRESS STAGE 7 SPEED: 0 mph
CSEPPMHR: 59 %
Estimated workload: 1 METS
Peak HR: 82 {beats}/min
Stage 1 DBP: 86 mmHg
Stage 1 Grade: 0 %
Stage 1 SBP: 117 mmHg
Stage 2 HR: 73 {beats}/min
Stage 2 Speed: 0 mph
Stage 3 DBP: 65 mmHg
Stage 3 HR: 62 {beats}/min
Stage 3 SBP: 106 mmHg
Stage 4 DBP: 68 mmHg
Stage 4 Grade: 0 %
Stage 4 HR: 68 {beats}/min
Stage 4 SBP: 137 mmHg
Stage 4 Speed: 0 mph
Stage 5 Grade: 0 %
Stage 6 Speed: 0 mph
Stage 7 DBP: 62 mmHg
Stage 7 Grade: 0 %

## 2014-09-25 ENCOUNTER — Encounter: Payer: Self-pay | Admitting: *Deleted

## 2014-09-26 ENCOUNTER — Encounter: Payer: Self-pay | Admitting: Cardiovascular Disease

## 2014-10-03 ENCOUNTER — Other Ambulatory Visit: Payer: Self-pay | Admitting: Cardiovascular Disease

## 2014-10-04 ENCOUNTER — Other Ambulatory Visit: Payer: Self-pay | Admitting: Cardiovascular Disease

## 2014-10-04 ENCOUNTER — Telehealth (HOSPITAL_COMMUNITY): Payer: Self-pay | Admitting: *Deleted

## 2014-10-04 DIAGNOSIS — I701 Atherosclerosis of renal artery: Secondary | ICD-10-CM

## 2014-10-09 ENCOUNTER — Other Ambulatory Visit: Payer: Self-pay | Admitting: Cardiology

## 2014-10-14 ENCOUNTER — Ambulatory Visit (HOSPITAL_COMMUNITY)
Admission: RE | Admit: 2014-10-14 | Discharge: 2014-10-14 | Disposition: A | Discharge status: Home / Self Care | Payer: Medicare Other | Source: Ambulatory Visit | Attending: Cardiovascular Disease | Admitting: Cardiovascular Disease

## 2014-10-14 DIAGNOSIS — I701 Atherosclerosis of renal artery: Secondary | ICD-10-CM | POA: Diagnosis not present

## 2014-10-14 NOTE — Progress Notes (Signed)
Renal Duplex Completed. Myria Steenbergen, BS, RDMS, RVT  

## 2014-10-15 ENCOUNTER — Telehealth: Payer: Self-pay | Admitting: *Deleted

## 2014-10-15 DIAGNOSIS — I10 Essential (primary) hypertension: Secondary | ICD-10-CM

## 2014-10-15 DIAGNOSIS — I701 Atherosclerosis of renal artery: Secondary | ICD-10-CM

## 2014-10-15 NOTE — Telephone Encounter (Signed)
LM with renal doppler results.  Order placed for repeat in one year.

## 2014-10-15 NOTE — Telephone Encounter (Signed)
-----   Message from Sanda Klein, MD sent at 10/15/2014 12:29 PM EDT ----- Unchanged moderate right renal artery stenosis. Repeat 12 months please

## 2014-11-28 ENCOUNTER — Telehealth: Payer: Self-pay | Admitting: Cardiovascular Disease

## 2014-11-28 ENCOUNTER — Other Ambulatory Visit: Payer: Self-pay | Admitting: *Deleted

## 2014-11-28 MED ORDER — BISOPROLOL-HYDROCHLOROTHIAZIDE 5-6.25 MG PO TABS: 1.0000 | ORAL_TABLET | Freq: Every day | ORAL | Status: DC

## 2014-11-28 NOTE — Telephone Encounter (Signed)
Pt aware prescription sent to the pharmacy electronocally

## 2014-11-28 NOTE — Telephone Encounter (Signed)
°  1. Which medications need to be refilled? Bisoprolol- HCTZ  2. Which pharmacy is medication to be sent to? [facility]   3. Do they need a 30 day or 90 day supply? 90  4. Would they like a call back once the medication has been sent to the pharmacy? Yes

## 2014-12-02 ENCOUNTER — Telehealth: Payer: Self-pay | Admitting: Internal Medicine

## 2014-12-02 NOTE — Telephone Encounter (Signed)
New problem   Pt's wife need to speak to someone concerning her husband pacer check by phone 8.18.16. Wife stated she had an app on her phone but not it is gone. Please call wife.

## 2014-12-02 NOTE — Telephone Encounter (Signed)
Spoke with pt's wife. Her phone on which she downloaded the [facility] app is not working properly and she will be unable to do pt's remote pacer check 12-05-14. Appt made on 03-10-15 with device clinic for pacer check and to re-download app (she says she will have a new phone by then).

## 2014-12-03 ENCOUNTER — Encounter: Payer: Self-pay | Admitting: Neurology

## 2014-12-03 ENCOUNTER — Telehealth: Payer: Self-pay | Admitting: Neurology

## 2014-12-03 ENCOUNTER — Ambulatory Visit (INDEPENDENT_AMBULATORY_CARE_PROVIDER_SITE_OTHER): Payer: Medicare Other | Admitting: Neurology

## 2014-12-03 VITALS — BP 120/70 | HR 88 | Ht 68.0 in | Wt 150.0 lb

## 2014-12-03 DIAGNOSIS — R441 Visual hallucinations: Secondary | ICD-10-CM | POA: Diagnosis not present

## 2014-12-03 DIAGNOSIS — H539 Unspecified visual disturbance: Secondary | ICD-10-CM | POA: Diagnosis not present

## 2014-12-03 DIAGNOSIS — G20 Parkinson's disease: Secondary | ICD-10-CM

## 2014-12-03 DIAGNOSIS — I259 Chronic ischemic heart disease, unspecified: Secondary | ICD-10-CM

## 2014-12-03 NOTE — Progress Notes (Signed)
Ian [name] was seen today in the movement disorders clinic for f/u.  He is accompanied by his wife who supplements the history.  The first symptom(s) the patient noticed was tremor in the R hand when he would use the hand or have a cup of coffee in it.  His wife believes that this began in 2004.  He was referred to Dr. Tilden Dome.  He tried some medication and according to his wife, he kept having adverse SE.  He was on mirapex and sinemet but reported dizziness and bad dreams with both.   He was referred to Dr. Yevonne Pax in about 2010 because he was interested in DBS but the patient was concerned about risks of the surgery and potential side effects.    09/28/12  I tried to change the levodopa back to the IR formulation but he did not like it because he was too drowsy and went back to the CR formulation.  , 2 po qid and carbidopa/levodopa 50/200 at night.    He has continued to have more tremor after the artane was d/c.  However, this has become less bothersome for him over time.  He does notice that sometimes the medication wears off quickly and sometimes it does not.  It seems inconsistent.  03/06/13 update:  The patient is accompanied by his wife who supplements the history.  Last visit, we tried to add entacapone to each of the 4 carbidopa/levodopa dosing times.  He called me because of nausea and we decreased it to twice a day dosing.  He continued to have nausea and the medication was discontinued.  The patient states that the medication makes his arms feel "heavy." He has stopped the carbidopa/levodopa 50/200 at night as he does not think it was beneficial.  Sometimes, he will wake up in the middle of the night and take an extra of the carbidopa/levodopa CR 25 100.  He and his wife have noted some jerking spells at night.  He sleeps very restless. I did review notes in his chart since last visit.  He went to the emergency room on September 21 with a sensation of palpitations and tachycardia.  His  pacemaker was interrogated and there were no problems and he was subsequently sent home.  06/11/13 update:  This patient is accompanied in the office by his spouse who supplements the history.  Pt is on carbidopa/levodopa 25/100 CR - 2 in the AM and 6 other dosages throughout the day.  When he awakens in the middle of the night he may take a few extra dosages and he estimates he takes a total daily levodopa dose of 1000-1200 mg per day.  He has been very senstive to medication and has not been able to tolerate the immediate release carbidopa/levodopa, entacapone or klonopin (made arms/legs hurt).  Tremor feels like it is getting worse.  Vision is getting worse but he went to the eye doctor and his vision was stable.  He feels like the levodopa only lasts 1.5-2 hrs.  No falls.  Is exercising.    08/13/13 update:   This patient is accompanied in the office by his spouse who supplements the history.  Pt is on carbidopa/levodopa 25/100, and takes approximately 8-10 tablets per day.  Last visit, I also added amantadine to see if that would help the tremor. He does think that has helped.  He presents today to try and change to rytary to see if it would last longer.  He does not wish to  try duopa.  He denies hallucinations, lightheadedness, falls, syncope or dyskinesia.     10/01/13 update:  Last visit, I changed the patient to rytary.  It turned out to be very expensive as the patient had no RX coverage, which I did not realize.  When he changed back, the IR formulation was accidentally called in instead of the CR prepartation (and he didn't tolerate the IR in the past).  The patient also didn't notice that and he isn't even sure now what he is taking but knows he is taking 2 po qid of either the CR or IR.  He is having hallucinations.  He is having swelling in the feet and legs as well.   They saw cardiology last week and were started on lasix and they didn't think that it made a difference.  They are worried  about a discoloration in legs.  He has lost weight.  Had one fall on May 10.  Walking into bedroom, got lightheaded and hit his back on the dresser.  He didn't think that he hit his head at the time but now that they think that he did as he has had hallucinations since.  However, looking back at our notes, it was 5/7 when he called here to d/c the rytary and changed to the IR levodopa it was 5/10 when he fell so timing was likely just coincidental as he had likely just changed back to the IR preparation.  11/12/13 update:  The patient was seen back in followup in the neurology clinic, accompanied by his wife who helps to supplement the history.  Patient has a history of Parkinson's disease.  Last visit, I changed his levodopa back to the CR preparation to see if that would help his hallucinations.  He is currently on 2 tablets by mouth 4 times per day (8am/12/4pm/8pm).  Today, the patient states that hallucinations continue.  He states that he continues to see people.   Medication helps tremor when he first takes it and then the tremor comes back before the next dose.   Last visit, I told him to discontinue his amantadine because of complaints of swelling.  I did review records since our last visit.  Because he noticed no changes in swelling after discontinuation of the amantadine, he followed up with his primary care physician.  His primary care physician felt that the swelling was likely due to venous insufficiency and he recommended that the patient restart the amantadine and he has.  The patient does state that he doesn't think that he had any break between d/c the IR carbidopa/levodopa 25/100, starting the CR version and when he restarted the amantadine.   The patient did see his cardiologist and he saw no cardiac reason for the edema.  He did not want to increase the patient's Lasix further because of complaints of orthostasis and dizziness.  No significant etiology was determined for his weight loss  either.  01/07/14 update:  The patient was seen today in followup in the neurology clinic, accompanied by his wife who helps to supplement the history.  The patient has switched back on his own from the CR version of carbidopa/levodopa to the immediate release form.  He is on 2 po 5 times per day (8am and then every 3 hours).  He is complaining about more tremor and more balance issues.  No falls but near falls.  He is off of amantadine.  Hallucinations went away after it was d/c but tremor definitely picked up.  Leg swelling is better but not gone.  Some memory problems.  No driving since march, 2015.    05/06/14 update:  The patient is seen in f/u, accompanied by his wife who supplements the history.   He is on carbidopa/levodopa 25/100 CR, 2 po 5 times per day (8am and then every 3 hours). He continues to c/o increasing tremor.  States that he is having m. Cramping at nighttime.  His last dose is at 8pm and bedtime is at 10 pm and cramping will start waking him at 3:30 am. Asks about trying to take a combination of one CR and one IR and see how that works.   He states that his memory is "going downhill" although his wife is not quite as sure.  He doesn't drive and hasn't for a year but has not trouble with pills.    08/06/14 update:  The patient is following up today, accompanied by his wife who supplements the history.  Last visit, the patient wanted to try a combination of carbidopa levodopa 25/100 immediate release and extended release so that he was taking 1 pill of each 5 times per day; however he really never did that and is still taking a carbidopa/levodopa 25/100 IR, 2 po 5 times a day.  We did add a carbidopa/levodopa 50/200 at night to see if that would help with his cramping;  The cramping is better at night and he only seems to have it if he has swelling.  His wife mentions that memory is not quite as good as it used to be.  He has some difficulty remembering previous conversations and having word  finding difficulties.  It is not a major issue, but it is something she is noticing.  Much has happened in his other medical history since last visit.  I reviewed those records.  The patient had a nuclear stress test on 06/13/2014, demonstrating a high risk study with a large area of ischemia in the LAD artery distribution and a fixed defect in the inferior wall.  An echocardiogram demonstrated a left ventricular ejection fraction of approximately 20%, which was much decreased compared to his prior study of 50-55%.  A heart catheterization was subsequently scheduled and performed on 06/20/2014.  There was no evidence of ischemia to account for the dropped ejection fraction.  It was felt that he was likely not a candidate for an ICD and they talked about CRT and decided to hold on that.  They saw Dr. Rayann Heman yesterday.  12/03/14 update:  The patient presents today, accompanied by his wife who supplements the history.  The records that were made available to me were reviewed.  Last visit, we talked about our limited options, and ultimately decided to try a combination of carbidopa/levodopa 25/100 IR and carbidopa/levodopa 25/100 CR, one tablet each of these 5 times per day.  There was some misunderstanding and he ended up taking carbidopa/levodopa 25/100 IR in addition to carbidopa/levodopa 50/200 CR, 5 times per day.  He was then taking the carbidopa/levodopa 50/200 at night as well.  Overall, this helped the tremor but hallucinations picked up and therefore he went back to carbidopa/levodopa 25/100 IR, 2 po 5 times a day.  He is having more difficulty opening his hands in the morning.  Pts biggest c/o is vision change and he thinks that it is from his PD meds.  Been to [facility] in November and I reviewed those records and they said that they thought that it was primarily surface related  but didn't think that it was significant.  He tried eye drops for dry eye and nothing helped.  He asks me multiple times what else he  can do.  His wife also asks me about a referral for a consultation regarding focused ultrasound.    Neuroimaging has previously been performed.  It is not available for my review today.  PREVIOUS MEDICATIONS: Sinemet (reported bad dreams/dizziness on regular formulation and same with Mirapex); requip;  on Sinemet CR currently, entacapone (felt dizzy and arms felt "heavy"); klonopin (made arms/legs hurt); artane; amantadine (hallucinations); rytary (costly and pt has no RX coverage)  ALLERGIES:   Allergies  Allergen Reactions  . Plavix [Clopidogrel]     Fatigue   . Pravastatin     Muscle aches  . Zocor [Simvastatin]     REACTION: dizziness  . Penicillins Rash    CURRENT MEDICATIONS:  Current Outpatient Prescriptions on File Prior to Visit  Medication Sig Dispense Refill  . aspirin 81 MG tablet Take 81 mg by mouth daily.    . bisoprolol-hydrochlorothiazide (ZIAC) 5-6.25 MG per tablet Take 1 tablet by mouth daily. 90 tablet 3  . carbidopa-levodopa (SINEMET IR) 25-100 MG per tablet Take 2 tablets by mouth 5 (five) times daily. 900 tablet 1  . furosemide (LASIX) 40 MG tablet Take 1 tablet (40 mg total) by mouth daily. 90 tablet 3  . lansoprazole (PREVACID) 15 MG capsule Take 15 mg by mouth daily as needed (stomach).     . polyethylene glycol (MIRALAX / GLYCOLAX) packet Take 17 g by mouth daily.    . potassium chloride (K-DUR) 10 MEQ tablet Take 1 tablet (10 mEq total) by mouth 2 (two) times daily. 180 tablet 3  . vitamin B-12 (CYANOCOBALAMIN) 1000 MCG tablet Take 1,000 mcg by mouth daily.     Current Facility-Administered Medications on File Prior to Visit  Medication Dose Route Frequency Provider Last Rate Last Dose  . DOBUTamine (DOBUTREX) 1,000 mcg/mL in dextrose 5% 250 mL infusion  5-20 mcg/kg/min (Order-Specific) Intravenous Continuous Carlena Bjornstad, MD 40.8 mL/hr at 09/20/14 1415 10 mcg/kg/min at 09/20/14 1415    PAST MEDICAL HISTORY:   Past Medical History  Diagnosis Date   . CAD (coronary artery disease) CABG x1 2001; PCI 2008    a. s/p CABG with LIMA-LAD in 2001 with AVR. b. BMS to LCx 8/08. c. Abnl nuc/dec EF 05/2014 - s/p cath with patent LIMA to LAD, patent dominant right, mild LCx disease, patent stent. Med rx.  . Allergy   . Diverticulitis   . GERD (gastroesophageal reflux disease)   . Hypertension   . Osteoarthritis   . Osteoporosis   . MALT lymphoma   . Parkinson's disease   . TIA (transient ischemic attack) 06/08    TIA  . Hyperlipidemia   . ED (erectile dysfunction)   . Aortic valve stenosis, acquired 2001    a. s/p Porcine AVR 2001.   Marland Kitchen Renal artery stenosis     a. 90% RRA stenosis previously. b. Duplex 09/2013: R renal artery 60-99%, L renal artery 1-59% - f/u due 09/2014.  . S/P cardiac pacemaker procedure, Medtronic Adapta L  ADDRL1, 01/21/12 01/22/2012  . Symptomatic bradycardia     a. s/p Medtronic pacer 2013.  Marland Kitchen Chronic systolic CHF (congestive heart failure)     a. EF 50-55% in 02/2013 -> EF 20-25% by 2D Echo 05/27/14, Dr. Gwenlyn Found did not think ischemically mediated based on cath 06/2014.    PAST SURGICAL HISTORY:   Past  Surgical History  Procedure Laterality Date  . Coronary artery bypass graft  2001    Lucianne Lei trigt ) aortic valve replacement 2001; LIMA-LAD.  Marland Kitchen Esophagogastroduodenoscopy      multiple, Colon/ EGD benign 11/2004  . Pilonidal cyst / sinus excision  1953  . Inguinal hernia repair      LIH 1997Llap. bilateral hernias 1998  . Coronary stent placement  11/2007    8/09  Left Circumflex Stent by Dr Toy Care started and aggrenox stopped  . Cataract extraction  06/09    left  . Laparoscopic cholecystectomy  07/11    Dr.Rosenbower  . Squamous cell carcinoma excision  3/13    back  . Aortic valve replacement  2001    Porcine valve  . Pacemaker insertion  01/21/2012    Medtronic Adapta L implanted for complete heart block  . Nm myocar perf wall motion  11/22/2007    mild septal ischemia,EF 66%  . Permanent pacemaker  insertion N/A 01/21/2012    Procedure: PERMANENT PACEMAKER INSERTION;  Surgeon: Thompson Grayer, MD;  Location[facility] C[facility] LAB;  Service: Cardiovascular;  Laterality: N/A;  . Cardiac catheterization  06/20/2014    Procedure: CORONARY/GRAFT ANGIOGRAPHY;  Surgeon: Lorretta Harp, MD;  Location: [facility] CATH LAB;  Service: Cardiovascular;;    SOCIAL HISTORY:   Social History   Social History  . Marital Status: Married    Spouse Name: N/A  . Number of Children: 3  . Years of Education: N/A   Occupational History  . retired- [employer]   . part-time sub/courier for schools- now only rarely    Social History Main Topics  . Smoking status: Former Smoker -- 1.00 packs/day for 5 years    Types: Cigarettes    Quit date: 06/06/1963  . Smokeless tobacco: Never Used  . Alcohol Use: No     Comment: very rare  . Drug Use: No  . Sexual Activity: No   Other Topics Concern  . Not on file   Social History Narrative   Has living will   Wife, then son [name], hold health care POA   Requests DNR --done   Requests no tube feeds if cognitively unaware    FAMILY HISTORY:   Family Status  Relation Status Death Age  . Mother Deceased 29    old age  . Father Deceased 77    acute bronchitis, cotton exposure  . Sister Deceased     trauma  . Brother Deceased     2, deceased  . Daughter Deceased 58    osteosarcoma  . Child Alive     3, healthy  . Brother Alive     healthy  . Sister Alive     healthy    ROS:    A complete 10 system review of systems was obtained and was unremarkable apart from what is mentioned above.  PHYSICAL EXAMINATION:    VITALS:   Filed Vitals:   12/03/14 1010  BP: 120/70  Pulse: 88  Height: 5\' 8"  (1.727 m)  Weight: 150 lb (68.04 kg)   Wt Readings from Last 3 Encounters:  12/03/14 150 lb (68.04 kg)  09/03/14 150 lb (68.04 kg)  08/06/14 149 lb (67.586 kg)     GEN:  The patient appears stated age and is in NAD. HEENT:  Normocephalic, atraumatic.  The  mucous membranes are moist. The superficial temporal arteries are without ropiness or tenderness. CV:  RRR Lungs:  CTAB Neck/HEME:  There are no  carotid bruits bilaterally.  Neurological examination:  Orientation: The patient is alert and oriented x3.  He is a very good historian today. Montreal Cognitive Assessment  12/03/2014  Visuospatial/ Executive (0/5) 2  Naming (0/3) 3  Attention: Read list of digits (0/2) 1  Attention: Read list of letters (0/1) 0  Attention: Serial 7 subtraction starting at 100 (0/3) 2  Language: Repeat phrase (0/2) 2  Language : Fluency (0/1) 0  Abstraction (0/2) 1  Delayed Recall (0/5) 2  Orientation (0/6) 5  Total 18  Adjusted Score (based on education) 18    Movement examination: Tone: There is normal tone in the bilateral upper extremities today.  The tone in the lower extremities is normal.  Abnormal movements: There is a moderate resting tremor bilaterally, R>L.  No dyskinesia. Coordination:  There is miminal decremation with RAM's, seen mostly with hand opening and closing on the R Gait and Station: The patient has mild difficulty arising out of a deep-seated chair without the use of the hands. The patient's stride length is normal.  He has mild postural instability.    Lab Results  Component Value Date   WBC 10.7* 06/17/2014   HGB 14.0 06/17/2014   HCT 41.8 06/17/2014   MCV 90.9 06/17/2014   PLT 328 06/17/2014     Chemistry      Component Value Date/Time   NA 140 06/17/2014 1533   K 4.5 06/17/2014 1533   CL 99 06/17/2014 1533   CO2 33* 06/17/2014 1533   BUN 19 06/17/2014 1533   CREATININE 0.89 06/17/2014 1533   CREATININE 0.73 01/07/2013 0653      Component Value Date/Time   CALCIUM 9.5 06/17/2014 1533   ALKPHOS 116 09/05/2013 1329   AST 15 09/05/2013 1329   ALT 10 09/05/2013 1329   BILITOT 0.8 09/05/2013 1329     Lab Results  Component Value Date   VITAMINB12 660 03/06/2013   Lab Results  Component Value Date   TSH 4.349  06/17/2014     ASSESSMENT/PLAN:  1.  Idiopathic Parkinsons disease.    -We discussed the diagnosis as well as pathophysiology of the disease.  We discussed treatment options as well as prognostic indicators.  Patient education was provided.  -His hallucinations went away with discontinuation of amantadine.  They had resumed when he greatly increased his carbidopa/levodopa 50/200 to2 tablets 5 times per day but he has stopped that and hallucinations, better.  -The patient thinks that the levodopa is affecting his vision but I do not think this is the case.  I told him that Parkinson's disease can cause dry eye, which his ophthalmologist told him as well, but he does not think that the drops are helping.  I told him he could stop his levodopa for a few days, but I also talked to him about the risks of doing so.  He wants to stop until Friday.  He will be very careful.  He does think that he is having more morning stiffness, so when we restarted it, we will likely add back the carbidopa/levodopa 50/200 just at night.  We have tried multiple accommodations with him in the past, including Rytary without a big difference.    -We will send him to neuro-ophthalmology to see if there is any other reason for vision change.  -His wife wanted a referral to [facility] for focused ultrasound.  I explained to them that this is not FDA approved for Parkinson's disease and we talked about the risks associated with  lesional therapy, but I am happy to refer him for a consult.  He is not a DBS candidate given other medical problems. 2.  Mild cognitive impairment, likely from longstanding PD.  -I think this is from long-standing Parkinson's disease.  We talked about the acetylcholinesterase inhibitors.  He really is not interested in Exelon pills because of the risk of nausea.  We talked about the Exelon patches but they're quite expensive.  I think Aricept would work as well but he just doesn't want another med right now.   3.   B12 deficiency.  -He is on oral supplements and his last level was 660 when rechecked. 4.  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  -He could not tolerate klonopin. 5.  I. will see him back in the next few months, sooner should new neurologic issues arise.

## 2014-12-03 NOTE — Telephone Encounter (Signed)
Notes and referral faxed to Dr Sanda Klein at [phone] with confirmation received.

## 2014-12-03 NOTE — Patient Instructions (Signed)
1. Hold Carbidopa Levodopa until Friday. We will call to check on you. 2. We will send referral to [facility] to see if they are doing focused ultrasound for Parkinson's Patients.  3. Referral will be made to Dr Sanda Klein at [facility]. Their office will call you directly with an appt. If you do not hear from them they can be reached at [phone].

## 2014-12-03 NOTE — Telephone Encounter (Signed)
Referral faxed to Dr Jerry Caras at [facility] for focused ultrasound at [phone] with confirmation received. They will call patient with appt.

## 2014-12-04 ENCOUNTER — Telehealth: Payer: Self-pay | Admitting: Neurology

## 2014-12-04 NOTE — Telephone Encounter (Signed)
Appt with Dr Sanda Klein scheduled 01/27/2015 at 2:00 pm. 6th floor [facility]. Patient's wife made aware.

## 2014-12-05 ENCOUNTER — Telehealth: Payer: Self-pay | Admitting: Neurology

## 2014-12-05 NOTE — Telephone Encounter (Signed)
Can this encounter be closed?

## 2014-12-05 NOTE — Telephone Encounter (Signed)
Pt's wife called about Tue's visit, about discontinuing his med/ "Carbolezo"? But he wants to resume it/ [phone]

## 2014-12-05 NOTE — Telephone Encounter (Signed)
See refill encounter from 11/28/14.

## 2014-12-05 NOTE — Telephone Encounter (Signed)
Spoke with patient's wife and she states his tremors are so bad he can not wait til tomorrow to restart Carbidopa Levodopa. His vision is no different. He will restart Carbidopa Levodopa 25/100 - 2 tablets 5 times daily and one tablet of Carbidopa Levodopa 50/200 at night.

## 2014-12-06 ENCOUNTER — Telehealth: Payer: Self-pay | Admitting: Neurology

## 2014-12-06 NOTE — Telephone Encounter (Signed)
FYI

## 2014-12-06 NOTE — Telephone Encounter (Signed)
Pt wife called and said that the patient would not be able to go [facility] for the treatment Dr tat wanted him to have done due the Pacemaker he has pt phone number is [phone]

## 2015-01-07 ENCOUNTER — Ambulatory Visit (INDEPENDENT_AMBULATORY_CARE_PROVIDER_SITE_OTHER): Payer: Medicare Other

## 2015-01-07 DIAGNOSIS — Z23 Encounter for immunization: Secondary | ICD-10-CM

## 2015-02-12 DIAGNOSIS — J309 Allergic rhinitis, unspecified: Secondary | ICD-10-CM | POA: Diagnosis not present

## 2015-03-10 ENCOUNTER — Ambulatory Visit (INDEPENDENT_AMBULATORY_CARE_PROVIDER_SITE_OTHER): Payer: Medicare Other | Admitting: *Deleted

## 2015-03-10 DIAGNOSIS — R001 Bradycardia, unspecified: Secondary | ICD-10-CM | POA: Diagnosis not present

## 2015-03-10 DIAGNOSIS — I442 Atrioventricular block, complete: Secondary | ICD-10-CM

## 2015-03-10 LAB — CUP PACEART INCLINIC DEVICE CHECK
Battery Remaining Longevity: 110 mo
Battery Voltage: 2.79 V
Brady Statistic AP VS Percent: 1 %
Brady Statistic AS VP Percent: 26 %
Brady Statistic AS VS Percent: 0 %
Date Time Interrogation Session: 20161121132706
Implantable Lead Implant Date: 20131004
Implantable Lead Implant Date: 20131004
Implantable Lead Location: 753860
Lead Channel Impedance Value: 492 Ohm
Lead Channel Pacing Threshold Amplitude: 1 V
Lead Channel Pacing Threshold Pulse Width: 0.4 ms
Lead Channel Pacing Threshold Pulse Width: 0.4 ms
Lead Channel Sensing Intrinsic Amplitude: 8 mV
Lead Channel Setting Pacing Amplitude: 2 V
Lead Channel Setting Pacing Amplitude: 2.5 V
Lead Channel Setting Pacing Pulse Width: 0.4 ms
Lead Channel Setting Sensing Sensitivity: 2.8 mV
MDC IDC LEAD LOCATION: 753859
MDC IDC MSMT BATTERY IMPEDANCE: 185 Ohm
MDC IDC MSMT LEADCHNL RA PACING THRESHOLD AMPLITUDE: 0.75 V
MDC IDC MSMT LEADCHNL RA SENSING INTR AMPL: 2.8 mV
MDC IDC MSMT LEADCHNL RV IMPEDANCE VALUE: 515 Ohm
MDC IDC STAT BRADY AP VP PERCENT: 73 %

## 2015-03-10 NOTE — Progress Notes (Signed)
Pacemaker check in clinic. Normal device function. Thresholds, sensing, impedances consistent with previous measurements. Device programmed to maximize longevity. 57 mode switches- longest 1:39, pk A 153bpm. 2 high ventricular rates noted- longest 18 beats. Device programmed at appropriate safety margins. Histogram distribution appropriate for patient activity level. Device programmed to optimize intrinsic conduction. Estimated longevity 8-11 years. Patient enrolled in remote follow-up. CL Smart application downloaded for pt. [facility] 06/09/15, ROV with [place] in May.

## 2015-03-26 ENCOUNTER — Encounter: Payer: Self-pay | Admitting: Cardiovascular Disease

## 2015-04-02 ENCOUNTER — Encounter: Payer: Self-pay | Admitting: Neurology

## 2015-04-04 ENCOUNTER — Encounter: Payer: Self-pay | Admitting: Neurology

## 2015-04-04 ENCOUNTER — Ambulatory Visit (INDEPENDENT_AMBULATORY_CARE_PROVIDER_SITE_OTHER): Payer: Medicare Other | Admitting: Neurology

## 2015-04-04 DIAGNOSIS — G20 Parkinson's disease: Secondary | ICD-10-CM | POA: Diagnosis not present

## 2015-04-04 DIAGNOSIS — G3184 Mild cognitive impairment, so stated: Secondary | ICD-10-CM

## 2015-04-04 DIAGNOSIS — I259 Chronic ischemic heart disease, unspecified: Secondary | ICD-10-CM | POA: Diagnosis not present

## 2015-04-04 DIAGNOSIS — G20A1 Parkinson's disease without dyskinesia, without mention of fluctuations: Secondary | ICD-10-CM

## 2015-04-04 DIAGNOSIS — R441 Visual hallucinations: Secondary | ICD-10-CM

## 2015-04-04 MED ORDER — ROTIGOTINE 2 MG/24HR TD PT24: 1.0000 | MEDICATED_PATCH | Freq: Every day | TRANSDERMAL | Status: DC

## 2015-04-04 MED ORDER — ROTIGOTINE 4 MG/24HR TD PT24: 1.0000 | MEDICATED_PATCH | Freq: Every day | TRANSDERMAL | Status: DC

## 2015-04-04 MED ORDER — CARBIDOPA-LEVODOPA ER 50-200 MG PO TBCR: 1.0000 | EXTENDED_RELEASE_TABLET | Freq: Every day | ORAL | Status: DC

## 2015-04-04 NOTE — Patient Instructions (Addendum)
1. Constipation and Parkinson's disease:   1.Rancho recipe for constipation in Parkinsons Disease:   -1 cup of bran, 2 cups of applesauce in 1 cup of prune juice  2.  Increase fiber intake (Metamucil,vegetables)  3.  Regular, moderate exercise can be beneficial.  4.  Avoid medications causing constipation, such as medications like antacids with calcium or magnesium  5.  Laxative overuse should be avoided.  6.  Stool softeners (Colace) can help with chronic constipation. 2. Start Neupro 2 mg patches daily. (samples given - 4 mg also provided)

## 2015-04-04 NOTE — Progress Notes (Signed)
Ian [name] was seen today in the movement disorders clinic for f/u.  He is accompanied by his wife who supplements the history.  The first symptom(s) the patient noticed was tremor in the R hand when he would use the hand or have a cup of coffee in it.  His wife believes that this began in 2004.  He was referred to Dr. Tilden Dome.  He tried some medication and according to his wife, he kept having adverse SE.  He was on mirapex and sinemet but reported dizziness and bad dreams with both.   He was referred to Dr. Yevonne Pax in about 2010 because he was interested in DBS but the patient was concerned about risks of the surgery and potential side effects.    09/28/12  I tried to change the levodopa back to the IR formulation but he did not like it because he was too drowsy and went back to the CR formulation.  , 2 po qid and carbidopa/levodopa 50/200 at night.    He has continued to have more tremor after the artane was d/c.  However, this has become less bothersome for him over time.  He does notice that sometimes the medication wears off quickly and sometimes it does not.  It seems inconsistent.  03/06/13 update:  The patient is accompanied by his wife who supplements the history.  Last visit, we tried to add entacapone to each of the 4 carbidopa/levodopa dosing times.  He called me because of nausea and we decreased it to twice a day dosing.  He continued to have nausea and the medication was discontinued.  The patient states that the medication makes his arms feel "heavy." He has stopped the carbidopa/levodopa 50/200 at night as he does not think it was beneficial.  Sometimes, he will wake up in the middle of the night and take an extra of the carbidopa/levodopa CR 25 100.  He and his wife have noted some jerking spells at night.  He sleeps very restless. I did review notes in his chart since last visit.  He went to the emergency room on September 21 with a sensation of palpitations and tachycardia.  His  pacemaker was interrogated and there were no problems and he was subsequently sent home.  06/11/13 update:  This patient is accompanied in the office by his spouse who supplements the history.  Pt is on carbidopa/levodopa 25/100 CR - 2 in the AM and 6 other dosages throughout the day.  When he awakens in the middle of the night he may take a few extra dosages and he estimates he takes a total daily levodopa dose of 1000-1200 mg per day.  He has been very senstive to medication and has not been able to tolerate the immediate release carbidopa/levodopa, entacapone or klonopin (made arms/legs hurt).  Tremor feels like it is getting worse.  Vision is getting worse but he went to the eye doctor and his vision was stable.  He feels like the levodopa only lasts 1.5-2 hrs.  No falls.  Is exercising.    08/13/13 update:   This patient is accompanied in the office by his spouse who supplements the history.  Pt is on carbidopa/levodopa 25/100, and takes approximately 8-10 tablets per day.  Last visit, I also added amantadine to see if that would help the tremor. He does think that has helped.  He presents today to try and change to rytary to see if it would last longer.  He does not wish to  try duopa.  He denies hallucinations, lightheadedness, falls, syncope or dyskinesia.     10/01/13 update:  Last visit, I changed the patient to rytary.  It turned out to be very expensive as the patient had no RX coverage, which I did not realize.  When he changed back, the IR formulation was accidentally called in instead of the CR prepartation (and he didn't tolerate the IR in the past).  The patient also didn't notice that and he isn't even sure now what he is taking but knows he is taking 2 po qid of either the CR or IR.  He is having hallucinations.  He is having swelling in the feet and legs as well.   They saw cardiology last week and were started on lasix and they didn't think that it made a difference.  They are worried  about a discoloration in legs.  He has lost weight.  Had one fall on May 10.  Walking into bedroom, got lightheaded and hit his back on the dresser.  He didn't think that he hit his head at the time but now that they think that he did as he has had hallucinations since.  However, looking back at our notes, it was 5/7 when he called here to d/c the rytary and changed to the IR levodopa it was 5/10 when he fell so timing was likely just coincidental as he had likely just changed back to the IR preparation.  11/12/13 update:  The patient was seen back in followup in the neurology clinic, accompanied by his wife who helps to supplement the history.  Patient has a history of Parkinson's disease.  Last visit, I changed his levodopa back to the CR preparation to see if that would help his hallucinations.  He is currently on 2 tablets by mouth 4 times per day (8am/12/4pm/8pm).  Today, the patient states that hallucinations continue.  He states that he continues to see people.   Medication helps tremor when he first takes it and then the tremor comes back before the next dose.   Last visit, I told him to discontinue his amantadine because of complaints of swelling.  I did review records since our last visit.  Because he noticed no changes in swelling after discontinuation of the amantadine, he followed up with his primary care physician.  His primary care physician felt that the swelling was likely due to venous insufficiency and he recommended that the patient restart the amantadine and he has.  The patient does state that he doesn't think that he had any break between d/c the IR carbidopa/levodopa 25/100, starting the CR version and when he restarted the amantadine.   The patient did see his cardiologist and he saw no cardiac reason for the edema.  He did not want to increase the patient's Lasix further because of complaints of orthostasis and dizziness.  No significant etiology was determined for his weight loss  either.  01/07/14 update:  The patient was seen today in followup in the neurology clinic, accompanied by his wife who helps to supplement the history.  The patient has switched back on his own from the CR version of carbidopa/levodopa to the immediate release form.  He is on 2 po 5 times per day (8am and then every 3 hours).  He is complaining about more tremor and more balance issues.  No falls but near falls.  He is off of amantadine.  Hallucinations went away after it was d/c but tremor definitely picked up.  Leg swelling is better but not gone.  Some memory problems.  No driving since march, 2015.    05/06/14 update:  The patient is seen in f/u, accompanied by his wife who supplements the history.   He is on carbidopa/levodopa 25/100 CR, 2 po 5 times per day (8am and then every 3 hours). He continues to c/o increasing tremor.  States that he is having m. Cramping at nighttime.  His last dose is at 8pm and bedtime is at 10 pm and cramping will start waking him at 3:30 am. Asks about trying to take a combination of one CR and one IR and see how that works.   He states that his memory is "going downhill" although his wife is not quite as sure.  He doesn't drive and hasn't for a year but has not trouble with pills.    08/06/14 update:  The patient is following up today, accompanied by his wife who supplements the history.  Last visit, the patient wanted to try a combination of carbidopa levodopa 25/100 immediate release and extended release so that he was taking 1 pill of each 5 times per day; however he really never did that and is still taking a carbidopa/levodopa 25/100 IR, 2 po 5 times a day.  We did add a carbidopa/levodopa 50/200 at night to see if that would help with his cramping;  The cramping is better at night and he only seems to have it if he has swelling.  His wife mentions that memory is not quite as good as it used to be.  He has some difficulty remembering previous conversations and having word  finding difficulties.  It is not a major issue, but it is something she is noticing.  Much has happened in his other medical history since last visit.  I reviewed those records.  The patient had a nuclear stress test on 06/13/2014, demonstrating a high risk study with a large area of ischemia in the LAD artery distribution and a fixed defect in the inferior wall.  An echocardiogram demonstrated a left ventricular ejection fraction of approximately 20%, which was much decreased compared to his prior study of 50-55%.  A heart catheterization was subsequently scheduled and performed on 06/20/2014.  There was no evidence of ischemia to account for the dropped ejection fraction.  It was felt that he was likely not a candidate for an ICD and they talked about CRT and decided to hold on that.  They saw Dr. Rayann Heman yesterday.  12/03/14 update:  The patient presents today, accompanied by his wife who supplements the history.  The records that were made available to me were reviewed.  Last visit, we talked about our limited options, and ultimately decided to try a combination of carbidopa/levodopa 25/100 IR and carbidopa/levodopa 25/100 CR, one tablet each of these 5 times per day.  There was some misunderstanding and he ended up taking carbidopa/levodopa 25/100 IR in addition to carbidopa/levodopa 50/200 CR, 5 times per day.  He was then taking the carbidopa/levodopa 50/200 at night as well.  Overall, this helped the tremor but hallucinations picked up and therefore he went back to carbidopa/levodopa 25/100 IR, 2 po 5 times a day.  He is having more difficulty opening his hands in the morning.  Pts biggest c/o is vision change and he thinks that it is from his PD meds.  Been to [facility] in November and I reviewed those records and they said that they thought that it was primarily surface related  but didn't think that it was significant.  He tried eye drops for dry eye and nothing helped.  He asks me multiple times what else he  can do.  His wife also asks me about a referral for a consultation regarding focused ultrasound.  04/04/15 update:  The patient is following up today, accompanied by his wife who supplements the history.  He has a history of Parkinson's disease.  He is on carbidopa/levodopa 25/100, 2 tablets 5 times per day and he went back on carbidopa/levodopa 50/200 qhs.  Last visit, he would not levodopa was affecting his vision, although I did not.  Regardless, we decided to stop the medication for a few days.  He was not able to tolerate being off the medication because tremor was so bad and ended up restarting it fairly quickly after stopping it.  Tremor continues to be bothersome and asks if there is anything else he can do.  Depression is getting worse because of that.  No suicidal or homicidal ideation.  He is doing yoga with his wife and enjoys that.  He has had no falls but has had some near falls.  I did refer him to Dr. Sanda Klein regarding his vision change and he is going in Jan (pt moved appt back).  His wife had also requested a referral last visit to [facility] to possible focused ultrasound, but ultimately they decided that they did not want to go to the appointment.  I think that is probably a good decision as focused u/s is not indicated yet for Parkinsons disease.  They do bring in a brochure today about duopa and ask me about that today.  Also saw an article in paper about study Dr. Jimmey Ralph is doing at [facility] and he called the number.  He showed me the article today and it was for patients who have had little weak body dementia and Parkinson's dementia with hallucinations.  He has begun to have a few more hallucinations since last visit, but reports that he knows that they are not real.  Neuroimaging has previously been performed.  It is not available for my review today.  PREVIOUS MEDICATIONS: Sinemet (reported bad dreams/dizziness on regular formulation and same with Mirapex); requip;  on Sinemet CR currently,  entacapone (felt dizzy and arms felt "heavy"); klonopin (made arms/legs hurt); artane; amantadine (hallucinations); rytary (costly and pt has no RX coverage)  ALLERGIES:   Allergies  Allergen Reactions  . Plavix [Clopidogrel]     Fatigue   . Pravastatin     Muscle aches  . Zocor [Simvastatin]     REACTION: dizziness  . Penicillins Rash    CURRENT MEDICATIONS:  Current Outpatient Prescriptions on File Prior to Visit  Medication Sig Dispense Refill  . aspirin 81 MG tablet Take 81 mg by mouth daily.    . bisoprolol-hydrochlorothiazide (ZIAC) 5-6.25 MG per tablet Take 1 tablet by mouth daily. 90 tablet 3  . carbidopa-levodopa (SINEMET IR) 25-100 MG per tablet Take 2 tablets by mouth 5 (five) times daily. 900 tablet 1  . furosemide (LASIX) 40 MG tablet Take 1 tablet (40 mg total) by mouth daily. 90 tablet 3  . lansoprazole (PREVACID) 15 MG capsule Take 15 mg by mouth daily as needed (stomach).     . polyethylene glycol (MIRALAX / GLYCOLAX) packet Take 17 g by mouth daily.    . potassium chloride (K-DUR) 10 MEQ tablet Take 1 tablet (10 mEq total) by mouth 2 (two) times daily. (Patient taking differently: Take  10 mEq by mouth daily. ) 180 tablet 3  . vitamin B-12 (CYANOCOBALAMIN) 1000 MCG tablet Take 1,000 mcg by mouth daily.     Current Facility-Administered Medications on File Prior to Visit  Medication Dose Route Frequency Provider Last Rate Last Dose  . DOBUTamine (DOBUTREX) 1,000 mcg/mL in dextrose 5% 250 mL infusion  5-20 mcg/kg/min (Order-Specific) Intravenous Continuous Carlena Bjornstad, MD 40.8 mL/hr at 09/20/14 1415 10 mcg/kg/min at 09/20/14 1415    PAST MEDICAL HISTORY:   Past Medical History  Diagnosis Date  . CAD (coronary artery disease) CABG x1 2001; PCI 2008    a. s/p CABG with LIMA-LAD in 2001 with AVR. b. BMS to LCx 8/08. c. Abnl nuc/dec EF 05/2014 - s/p cath with patent LIMA to LAD, patent dominant right, mild LCx disease, patent stent. Med rx.  . Allergy   .  Diverticulitis   . GERD (gastroesophageal reflux disease)   . Hypertension   . Osteoarthritis   . Osteoporosis   . MALT lymphoma   . Parkinson's disease   . TIA (transient ischemic attack) 06/08    TIA  . Hyperlipidemia   . ED (erectile dysfunction)   . Aortic valve stenosis, acquired 2001    a. s/p Porcine AVR 2001.   Marland Kitchen Renal artery stenosis     a. 90% RRA stenosis previously. b. Duplex 09/2013: R renal artery 60-99%, L renal artery 1-59% - f/u due 09/2014.  . S/P cardiac pacemaker procedure, Medtronic Adapta L  ADDRL1, 01/21/12 01/22/2012  . Symptomatic bradycardia     a. s/p Medtronic pacer 2013.  Marland Kitchen Chronic systolic CHF (congestive heart failure)     a. EF 50-55% in 02/2013 -> EF 20-25% by 2D Echo 05/27/14, Dr. Gwenlyn Found did not think ischemically mediated based on cath 06/2014.    PAST SURGICAL HISTORY:   Past Surgical History  Procedure Laterality Date  . Coronary artery bypass graft  2001    Lucianne Lei trigt ) aortic valve replacement 2001; LIMA-LAD.  Marland Kitchen Esophagogastroduodenoscopy      multiple, Colon/ EGD benign 11/2004  . Pilonidal cyst / sinus excision  1953  . Inguinal hernia repair      LIH 1997Llap. bilateral hernias 1998  . Coronary stent placement  11/2007    8/09  Left Circumflex Stent by Dr Toy Care started and aggrenox stopped  . Cataract extraction  06/09    left  . Laparoscopic cholecystectomy  07/11    Dr.Rosenbower  . Squamous cell carcinoma excision  3/13    back  . Aortic valve replacement  2001    Porcine valve  . Pacemaker insertion  01/21/2012    Medtronic Adapta L implanted for complete heart block  . Nm myocar perf wall motion  11/22/2007    mild septal ischemia,EF 66%  . Permanent pacemaker insertion N/A 01/21/2012    Procedure: PERMANENT PACEMAKER INSERTION;  Surgeon: Thompson Grayer, MD;  Location: [facility] CATH LAB;  Service: Cardiovascular;  Laterality: N/A;  . Cardiac catheterization  06/20/2014    Procedure: CORONARY/GRAFT ANGIOGRAPHY;  Surgeon: Lorretta Harp, MD;  Location: [facility] CATH LAB;  Service: Cardiovascular;;    SOCIAL HISTORY:   Social History   Social History  . Marital Status: Married    Spouse Name: N/A  . Number of Children: 3  . Years of Education: N/A   Occupational History  . retired- [employer]   . part-time sub/courier for schools- now only rarely    Social History Main Topics  .  Smoking status: Former Smoker -- 1.00 packs/day for 5 years    Types: Cigarettes    Quit date: 06/06/1963  . Smokeless tobacco: Never Used  . Alcohol Use: No     Comment: very rare  . Drug Use: No  . Sexual Activity: No   Other Topics Concern  . Not on file   Social History Narrative   Has living will   Wife, then son [name], hold health care POA   Requests DNR --done   Requests no tube feeds if cognitively unaware    FAMILY HISTORY:   Family Status  Relation Status Death Age  . Mother Deceased 33    old age  . Father Deceased 21    acute bronchitis, cotton exposure  . Sister Deceased     trauma  . Brother Deceased     2, deceased  . Daughter Deceased 31    osteosarcoma  . Child Alive     3, healthy  . Brother Alive     healthy  . Sister Alive     healthy    ROS:    A complete 10 system review of systems was obtained and was unremarkable apart from what is mentioned above.  PHYSICAL EXAMINATION:    VITALS:   There were no vitals filed for this visit. Wt Readings from Last 3 Encounters:  12/03/14 150 lb (68.04 kg)  09/03/14 150 lb (68.04 kg)  08/06/14 149 lb (67.586 kg)     GEN:  The patient appears stated age and is in NAD. HEENT:  Normocephalic, atraumatic.  The mucous membranes are moist. The superficial temporal arteries are without ropiness or tenderness. CV:  RRR Lungs:  CTAB Neck/HEME:  There are no carotid bruits bilaterally.  Neurological examination:  Orientation: The patient is alert and oriented x3.  He is a very good historian today. Montreal Cognitive Assessment  12/03/2014   Visuospatial/ Executive (0/5) 2  Naming (0/3) 3  Attention: Read list of digits (0/2) 1  Attention: Read list of letters (0/1) 0  Attention: Serial 7 subtraction starting at 100 (0/3) 2  Language: Repeat phrase (0/2) 2  Language : Fluency (0/1) 0  Abstraction (0/2) 1  Delayed Recall (0/5) 2  Orientation (0/6) 5  Total 18  Adjusted Score (based on education) 18    Movement examination: Tone: There is normal tone in the bilateral upper extremities today.  The tone in the lower extremities is normal.  Abnormal movements: There is a moderate resting tremor bilaterally, R>L.  No dyskinesia. Coordination:  There is miminal decremation with RAM's, seen mostly with hand opening and closing on the R Gait and Station: The patient has mild difficulty arising out of a deep-seated chair without the use of the hands. The patient's stride length is normal.  He has mild postural instability.    Lab Results  Component Value Date   WBC 10.7* 06/17/2014   HGB 14.0 06/17/2014   HCT 41.8 06/17/2014   MCV 90.9 06/17/2014   PLT 328 06/17/2014     Chemistry      Component Value Date/Time   NA 140 06/17/2014 1533   K 4.5 06/17/2014 1533   CL 99 06/17/2014 1533   CO2 33* 06/17/2014 1533   BUN 19 06/17/2014 1533   CREATININE 0.89 06/17/2014 1533   CREATININE 0.73 01/07/2013 0653      Component Value Date/Time   CALCIUM 9.5 06/17/2014 1533   ALKPHOS 116 09/05/2013 1329   AST 15 09/05/2013 1329  ALT 10 09/05/2013 1329   BILITOT 0.8 09/05/2013 1329     Lab Results  Component Value Date   VITAMINB12 660 03/06/2013   Lab Results  Component Value Date   TSH 4.349 06/17/2014     ASSESSMENT/PLAN:  1.  Idiopathic Parkinsons disease.    -Long discussion again with the patient and his wife.  He will continue on carbidopa/levodopa 25/100, 2 tablets 5 times per day in addition to carbidopa/levodopa 50/200 at night.  Unfortunately, he has very significant levodopa resistant tremor and tremor  has become very bothersome to him.  He has failed almost all other medications.  He is likely not a DBS candidate because of other medical problems.  He asks me about any other medications.  He has tried virtually everything on the market with the exception of Neupro.  I am a little worried about that and talk to him about that today.  I am worried it will increase hallucinations.  He ultimately decided to try it.  I'm going to be very cautious and gave him samples of the 2 mg for a month and if he tolerates that we can increase to 4 mg per mL call him in a few weeks and see how he does.  Risks, benefits, side effects and alternative therapies were discussed.  The opportunity to ask questions was given and they were answered to the best of my ability.  The patient expressed understanding and willingness to follow the outlined treatment protocols.  -He has an appointment in a few weeks with Dr. Hassell Done at [facility] for vision change  -I asked me about duopa.  I do not really think he needs this therapy as he really does not have any significant motor fluctuations.  Nonetheless, I talked to him in detail about this therapy and in the end he decided that he was not interested.  -As above, he asked me about the [facility] study with Dr. Jimmey Ralph.  I do not think he is a candidate for this.  He can certainly call and get details and they can decide if he is a candidate. 2.  Mild cognitive impairment, likely from longstanding PD.  -I think this is from long-standing Parkinson's disease.  We talked about the acetylcholinesterase inhibitors.  He really is not interested in Exelon pills because of the risk of nausea.  We talked about the Exelon patches but they're quite expensive.  I think Aricept would work as well but he just doesn't want another med right now.  As above, he has been having more hallucinations lately and I am a little worried that the Neupro may pick this up.  He will let me know. 3.  B12 deficiency.  -He is on  oral supplements and his last level was 660 when rechecked. 4.  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  -He could not tolerate klonopin. 5.  I. will see him back in the next few months, sooner should new neurologic issues arise.  Much greater than 50% of this visit was spent in counseling with the patient and the family.  Total face to face time:  40 min

## 2015-04-04 NOTE — Addendum Note (Signed)
Addended byAnnamaria Helling on: 04/04/2015 01:35 PM   Modules accepted: Orders

## 2015-04-23 ENCOUNTER — Telehealth: Payer: Self-pay | Admitting: Neurology

## 2015-04-23 NOTE — Telephone Encounter (Signed)
sure

## 2015-04-23 NOTE — Telephone Encounter (Signed)
Patient made aware to go ahead and increase to 4 mg patches and call and let me know how he does.

## 2015-04-23 NOTE — Telephone Encounter (Signed)
Called patient to see how he is doing on Neupro patches. Patient has noticed no change in tremor. He is still having the same amount of hallucinations as before - no increase. He has 10 days of 2 mg patches left. Would you like him to increase to 4 mg patches?

## 2015-04-28 ENCOUNTER — Encounter: Payer: Medicare Other | Admitting: Internal Medicine

## 2015-04-28 DIAGNOSIS — Z79899 Other long term (current) drug therapy: Secondary | ICD-10-CM | POA: Diagnosis not present

## 2015-04-28 DIAGNOSIS — Z947 Corneal transplant status: Secondary | ICD-10-CM | POA: Diagnosis not present

## 2015-04-28 DIAGNOSIS — H538 Other visual disturbances: Secondary | ICD-10-CM | POA: Diagnosis not present

## 2015-04-28 DIAGNOSIS — H5111 Convergence insufficiency: Secondary | ICD-10-CM | POA: Diagnosis not present

## 2015-04-28 DIAGNOSIS — H02105 Unspecified ectropion of left lower eyelid: Secondary | ICD-10-CM | POA: Diagnosis not present

## 2015-04-28 DIAGNOSIS — Z7982 Long term (current) use of aspirin: Secondary | ICD-10-CM | POA: Diagnosis not present

## 2015-04-28 DIAGNOSIS — H04123 Dry eye syndrome of bilateral lacrimal glands: Secondary | ICD-10-CM | POA: Diagnosis not present

## 2015-04-28 DIAGNOSIS — H02102 Unspecified ectropion of right lower eyelid: Secondary | ICD-10-CM | POA: Diagnosis not present

## 2015-04-28 DIAGNOSIS — Z87891 Personal history of nicotine dependence: Secondary | ICD-10-CM | POA: Diagnosis not present

## 2015-04-28 DIAGNOSIS — Z880 Allergy status to penicillin: Secondary | ICD-10-CM | POA: Diagnosis not present

## 2015-04-28 DIAGNOSIS — G20 Parkinson's disease: Secondary | ICD-10-CM | POA: Diagnosis not present

## 2015-04-28 DIAGNOSIS — I10 Essential (primary) hypertension: Secondary | ICD-10-CM | POA: Diagnosis not present

## 2015-04-28 DIAGNOSIS — Z888 Allergy status to other drugs, medicaments and biological substances status: Secondary | ICD-10-CM | POA: Diagnosis not present

## 2015-04-30 DIAGNOSIS — H5111 Convergence insufficiency: Secondary | ICD-10-CM | POA: Diagnosis not present

## 2015-04-30 DIAGNOSIS — H5053 Vertical heterophoria: Secondary | ICD-10-CM | POA: Diagnosis not present

## 2015-04-30 DIAGNOSIS — G20 Parkinson's disease: Secondary | ICD-10-CM | POA: Diagnosis not present

## 2015-04-30 DIAGNOSIS — H5052 Exophoria: Secondary | ICD-10-CM | POA: Diagnosis not present

## 2015-04-30 DIAGNOSIS — H532 Diplopia: Secondary | ICD-10-CM | POA: Diagnosis not present

## 2015-05-07 DIAGNOSIS — H02834 Dermatochalasis of left upper eyelid: Secondary | ICD-10-CM | POA: Diagnosis not present

## 2015-05-07 DIAGNOSIS — H02205 Unspecified lagophthalmos left lower eyelid: Secondary | ICD-10-CM | POA: Diagnosis not present

## 2015-05-07 DIAGNOSIS — Z7982 Long term (current) use of aspirin: Secondary | ICD-10-CM | POA: Diagnosis not present

## 2015-05-07 DIAGNOSIS — Z9849 Cataract extraction status, unspecified eye: Secondary | ICD-10-CM | POA: Diagnosis not present

## 2015-05-07 DIAGNOSIS — H02202 Unspecified lagophthalmos right lower eyelid: Secondary | ICD-10-CM | POA: Diagnosis not present

## 2015-05-07 DIAGNOSIS — H1851 Endothelial corneal dystrophy: Secondary | ICD-10-CM | POA: Diagnosis not present

## 2015-05-07 DIAGNOSIS — Z888 Allergy status to other drugs, medicaments and biological substances status: Secondary | ICD-10-CM | POA: Diagnosis not present

## 2015-05-07 DIAGNOSIS — H01022 Squamous blepharitis right lower eyelid: Secondary | ICD-10-CM | POA: Diagnosis not present

## 2015-05-07 DIAGNOSIS — I10 Essential (primary) hypertension: Secondary | ICD-10-CM | POA: Diagnosis not present

## 2015-05-07 DIAGNOSIS — H02532 Eyelid retraction right lower eyelid: Secondary | ICD-10-CM | POA: Diagnosis not present

## 2015-05-07 DIAGNOSIS — H01021 Squamous blepharitis right upper eyelid: Secondary | ICD-10-CM | POA: Diagnosis not present

## 2015-05-07 DIAGNOSIS — Z880 Allergy status to penicillin: Secondary | ICD-10-CM | POA: Diagnosis not present

## 2015-05-07 DIAGNOSIS — G20 Parkinson's disease: Secondary | ICD-10-CM | POA: Diagnosis not present

## 2015-05-07 DIAGNOSIS — H01025 Squamous blepharitis left lower eyelid: Secondary | ICD-10-CM | POA: Diagnosis not present

## 2015-05-07 DIAGNOSIS — L858 Other specified epidermal thickening: Secondary | ICD-10-CM | POA: Diagnosis not present

## 2015-05-07 DIAGNOSIS — Z961 Presence of intraocular lens: Secondary | ICD-10-CM | POA: Diagnosis not present

## 2015-05-07 DIAGNOSIS — L821 Other seborrheic keratosis: Secondary | ICD-10-CM | POA: Diagnosis not present

## 2015-05-07 DIAGNOSIS — H02535 Eyelid retraction left lower eyelid: Secondary | ICD-10-CM | POA: Diagnosis not present

## 2015-05-07 DIAGNOSIS — Z87891 Personal history of nicotine dependence: Secondary | ICD-10-CM | POA: Diagnosis not present

## 2015-05-07 DIAGNOSIS — H02831 Dermatochalasis of right upper eyelid: Secondary | ICD-10-CM | POA: Diagnosis not present

## 2015-05-07 DIAGNOSIS — H02403 Unspecified ptosis of bilateral eyelids: Secondary | ICD-10-CM | POA: Diagnosis not present

## 2015-05-07 DIAGNOSIS — B078 Other viral warts: Secondary | ICD-10-CM | POA: Diagnosis not present

## 2015-05-07 DIAGNOSIS — H01024 Squamous blepharitis left upper eyelid: Secondary | ICD-10-CM | POA: Diagnosis not present

## 2015-05-09 ENCOUNTER — Telehealth: Payer: Self-pay | Admitting: Neurology

## 2015-05-09 NOTE — Telephone Encounter (Signed)
Tried to call patient back with no answer and no voicemail available.

## 2015-05-09 NOTE — Telephone Encounter (Signed)
Pt's wife [name] called in regards to the sample medication Dr Tat gave him/Dawn  CB# [phone]

## 2015-05-12 NOTE — Telephone Encounter (Signed)
VM-PT's wife [name] said she was returning your call/Dawn CB# [phone]

## 2015-05-12 NOTE — Telephone Encounter (Signed)
Left message on machine for patient to call back.

## 2015-05-12 NOTE — Telephone Encounter (Addendum)
Spoke with patient and after increasing to Neupro 4 mg patch he still feels that it has made no significant difference on tremor. Please advise.

## 2015-05-12 NOTE — Telephone Encounter (Signed)
Spoke with patient and he did have some sleepiness and drowsiness that he didn't mention when he went up to 4 mg patches. His hallucinations have been better. He will hold off on trying the 6 mg for now and if it notices tremor gets worse when he stops the patches he will let us know.

## 2015-05-12 NOTE — Telephone Encounter (Signed)
I'm not sure it will help at all but if no SE, can try to increase to 6 mg (make sure no hallucinations)

## 2015-05-20 ENCOUNTER — Ambulatory Visit (INDEPENDENT_AMBULATORY_CARE_PROVIDER_SITE_OTHER): Payer: Medicare Other | Admitting: Family Medicine

## 2015-05-20 VITALS — BP 118/72 | HR 80 | Temp 97.8°F | Resp 19 | Ht 66.0 in | Wt 153.0 lb

## 2015-05-20 DIAGNOSIS — S00411A Abrasion of right ear, initial encounter: Secondary | ICD-10-CM

## 2015-05-20 DIAGNOSIS — H6121 Impacted cerumen, right ear: Secondary | ICD-10-CM

## 2015-05-20 DIAGNOSIS — H60391 Other infective otitis externa, right ear: Secondary | ICD-10-CM

## 2015-05-20 MED ORDER — OFLOXACIN 0.3 % OT SOLN: 10.0000 [drp] | Freq: Every day | OTIC | Status: DC

## 2015-05-20 NOTE — Progress Notes (Signed)
Subjective:  By signing my name below, I, Rawaa Al Rifaie, attest that this documentation has been prepared under the direction and in the presence of Merri Ray, MD.  Leandra Kern, Medical Scribe. 05/20/2015.  5:49 PM.  I personally performed the services described in this documentation, which was scribed in my presence. The recorded information has been reviewed and considered, and addended by me as needed.   Patient ID: Ian [name], male    DOB: [birth year], 82 y.o.   MRN: [id number]  Chief Complaint  Patient presents with  . Cerumen Impaction    both ears    HPI HPI Comments: Ian [name] is a 82 y.o. male who presents to [facility] complaining of difficulty hearing due to possible BL cerumen impaction, right worse than left, gradual onset at least 2 weeks ago. Pt has a hx of multiple medical problems per problem list.  Pt reports that he used OTC medications such as debrox that he usually finds temporary relief with it. He reports associated "puffiness" of the area with drainage, however he denies noticed discharge on fabric after laying down, or pain of the area. Pt notes that he has a very dry skin normally.  Pt has not recently travelled.   Patient Active Problem List   Diagnosis Date Noted  . Chronic systolic CHF (congestive heart failure) ([place]) 06/21/2014  . Ischemic heart disease 06/20/2014  . Abnormal nuclear stress test   . Complete heart block ([place])   . Chronic systolic dysfunction of left ventricle   . Left ventricular dysfunction 06/17/2014  . Advanced directives, counseling/discussion 04/23/2014  . Edema 10/09/2013  . Nonsustained ventricular tachycardia ([place]) 06/24/2013  . Routine general medical examination at a health care facility 04/18/2013  . Vitamin B 12 deficiency 09/04/2012  . Constipation - functional 03/29/2012  . S/P cardiac pacemaker procedure, Medtronic Adapta L  ADDRL1, 01/21/12 01/22/2012  . Right renal artery  stenosis - 90% 01/19/2012  . Symptomatic bradycardia, increased fatigue, difficulty staying awake, complete heart block  01/19/2012  . Heart block AV second degree -- 2:1 01/19/2012  . TIA 10/13/2006  . MALT lymphoma ([place]) 10/07/2006  . Parkinson disease ([place]) 10/07/2006  . Essential hypertension, benign 10/07/2006  . ALLERGIC RHINITIS 10/07/2006  . GERD 10/07/2006  . DIVERTICULOSIS, COLON 10/07/2006  . OSTEOARTHRITIS 10/07/2006  . OSTEOPOROSIS 10/07/2006  . Coronary artery disease without angina pectoris 10/07/1999  . Aortic valve stenosis, acquired -- s/p AVR with Porcie Bioprosthetic AVR; By Echo in 05/2009 moderate stenosis,AVA 0.98cm. 09/19/1999   Past Medical History  Diagnosis Date  . CAD (coronary artery disease) CABG x1 2001; PCI 2008    a. s/p CABG with LIMA-LAD in 2001 with AVR. b. BMS to LCx 8/08. c. Abnl nuc/dec EF 05/2014 - s/p cath with patent LIMA to LAD, patent dominant right, mild LCx disease, patent stent. Med rx.  . Allergy   . Diverticulitis   . GERD (gastroesophageal reflux disease)   . Hypertension   . Osteoarthritis   . Osteoporosis   . MALT lymphoma ([place])   . Parkinson's disease   . TIA (transient ischemic attack) 06/08    TIA  . Hyperlipidemia   . ED (erectile dysfunction)   . Aortic valve stenosis, acquired 2001    a. s/p Porcine AVR 2001.   Marland Kitchen Renal artery stenosis (H[facility])     a. 90% RRA stenosis previously. b. Duplex 09/2013: R renal artery 60-99%, L renal artery 1-59% - f/u due  09/2014.  . S/P cardiac pacemaker procedure, Medtronic Adapta L  ADDRL1, 01/21/12 01/22/2012  . Symptomatic bradycardia     a. s/p Medtronic pacer 2013.  Marland Kitchen Chronic systolic CHF (congestive heart failure) (HCC)     a. EF 50-55% in 02/2013 -> EF 20-25% by 2D Echo 05/27/14, Dr. Gwenlyn Found did not think ischemically mediated based on cath 06/2014.   Past Surgical History  Procedure Laterality Date  . Coronary artery bypass graft  2001    Lucianne Lei trigt ) aortic valve replacement 2001;  LIMA-LAD.  Marland Kitchen Esophagogastroduodenoscopy      multiple, Colon/ EGD benign 11/2004  . Pilonidal cyst / sinus excision  1953  . Inguinal hernia repair      LIH 1997Llap. bilateral hernias 1998  . Coronary stent placement  11/2007    8/09  Left Circumflex Stent by Dr Toy Care started and aggrenox stopped  . Cataract extraction  06/09    left  . Laparoscopic cholecystectomy  07/11    Dr.Rosenbower  . Squamous cell carcinoma excision  3/13    back  . Aortic valve replacement  2001    Porcine valve  . Pacemaker insertion  01/21/2012    Medtronic Adapta L implanted for complete heart block  . Nm myocar perf wall motion  11/22/2007    mild septal ischemia,EF 66%  . Permanent pacemaker insertion N/A 01/21/2012    Procedure: PERMANENT PACEMAKER INSERTION;  Surgeon: Thompson Grayer, MD;  Location: [facility] CATH LAB;  Service: Cardiovascular;  Laterality: N/A;  . Cardiac catheterization  06/20/2014    Procedure: CORONARY/GRAFT ANGIOGRAPHY;  Surgeon: Lorretta Harp, MD;  Location: [facility] CATH LAB;  Service: Cardiovascular;;   Allergies  Allergen Reactions  . Plavix [Clopidogrel]     Fatigue   . Pravastatin     Muscle aches  . Zocor [Simvastatin]     REACTION: dizziness  . Penicillins Rash   Prior to Admission medications   Medication Sig Start Date End Date Taking? Authorizing Provider  aspirin 81 MG tablet Take 81 mg by mouth daily.   Yes Historical Provider, MD  bisoprolol-hydrochlorothiazide (ZIAC) 5-6.25 MG per tablet Take 1 tablet by mouth daily. 11/28/14  Yes Mihai Croitoru, MD  carbidopa-levodopa (SINEMET CR) 50-200 MG tablet Take 1 tablet by mouth at bedtime. 04/04/15  Yes Rebecca S Tat, DO  carbidopa-levodopa (SINEMET IR) 25-100 MG per tablet Take 2 tablets by mouth 5 (five) times daily. 08/22/14  Yes Rebecca S Tat, DO  furosemide (LASIX) 40 MG tablet Take 1 tablet (40 mg total) by mouth daily. 07/15/14  Yes Isaiah Serge, NP  lansoprazole (PREVACID) 15 MG capsule Take 15 mg by mouth daily as  needed (stomach).    Yes Historical Provider, MD  polyethylene glycol (MIRALAX / GLYCOLAX) packet Take 17 g by mouth daily.   Yes Historical Provider, MD  potassium chloride (K-DUR) 10 MEQ tablet Take 1 tablet (10 mEq total) by mouth 2 (two) times daily. Patient taking differently: Take 10 mEq by mouth daily.  07/15/14  Yes Isaiah Serge, NP  vitamin B-12 (CYANOCOBALAMIN) 1000 MCG tablet Take 1,000 mcg by mouth daily.   Yes Historical Provider, MD  rotigotine (NEUPRO) 2 MG/24HR Place 1 patch onto the skin daily. Patient not taking: Reported on 05/20/2015 04/04/15   Eustace Quail Tat, DO  rotigotine (NEUPRO) 4 MG/24HR Place 1 patch onto the skin daily. Patient not taking: Reported on 05/20/2015 04/04/15   Eustace Quail Tat, DO   Social History   Social History  .  Marital Status: Married    Spouse Name: N/A  . Number of Children: 3  . Years of Education: N/A   Occupational History  . retired- [employer]   . part-time sub/courier for schools- now only rarely    Social History Main Topics  . Smoking status: Former Smoker -- 1.00 packs/day for 5 years    Types: Cigarettes    Quit date: 06/06/1963  . Smokeless tobacco: Never Used  . Alcohol Use: No     Comment: very rare  . Drug Use: No  . Sexual Activity: No   Other Topics Concern  . Not on file   Social History Narrative   Has living will   Wife, then son [name], hold health care POA   Requests DNR --done   Requests no tube feeds if cognitively unaware    Review of Systems  HENT: Positive for ear discharge and hearing loss. Negative for ear pain.       Objective:   Physical Exam  Constitutional: He is oriented to person, place, and time. He appears well-developed and well-nourished. No distress.  HENT:  Head: Normocephalic and atraumatic.  External pinna is nl, no soft tissue swelling. No mastoid tenderness. No lymphadenopathy along the ear. There is dark [name] cerumen obstruction the right canal.  Left canal is  overall clear but white minimal cerumen at the base. TM is clear.  Dark [name] cerumen was removed with a white loop. There does appear to be some canal edema, with faint exudate within canal mixed with cerumen. There is one small area abrasion, small amount of bright ref blood on the inferior aspect when cerumen is removed.   Eyes: EOM are normal. Pupils are equal, round, and reactive to light.  Neck: Neck supple.  Cardiovascular: Normal rate and regular rhythm.   Pulmonary/Chest: Effort normal and breath sounds normal. No respiratory distress. He has no wheezes. He has no rales.  Neurological: He is alert and oriented to person, place, and time. No cranial nerve deficit.  Skin: Skin is warm and dry.  Psychiatric: He has a normal mood and affect. His behavior is normal.  Nursing note and vitals reviewed.  After colace followed by lovage, hearing its restored. Minimal exudate vs cerumen around canal with no singificant edema. TM is pearly grey.   Filed Vitals:   05/20/15 1622  BP: 118/72  Pulse: 80  Temp: 97.8 F (36.6 C)  TempSrc: Oral  Resp: 19  Height: 5\' 6"  (1.676 m)  Weight: 153 lb (69.4 kg)  SpO2: 94%      Assessment & Plan:   Ian [name] is a 82 y.o. male Cerumen impaction, right - Plan: ofloxacin (FLOXIN OTIC) 0.3 % otic solution  Ear abrasion, right, initial encounter  Otitis, externa, infective, right  Suspected primary cerumen impaction, but small abrasion noted after initial manual removal of large dark cerumen in canal. Possible slight friable mucosa, and after lavage, although hearing improved, still small amount of sediment versus exudate in canal with possible otitis externa.  -Floxin Otic drops 10 drops daily for 1 week  -If not improving throughout this week, or recurrence of blockage symptoms, or any pain, refer to ENT. They can call me for this referral. RTC if worsening.  Meds ordered this encounter  Medications  . ofloxacin (FLOXIN OTIC) 0.3 % otic  solution    Sig: Place 10 drops into the right ear daily. For 1 week.    Dispense:  5 mL  Refill:  0   Patient Instructions  I suspect most of the problem was due to the wax or cerumen impaction. See information on this below. There was a small area of abrasion and some irritation of the canal, which can be early otitis externa. For this reason can start the drops as discussed, but if symptoms are not improving throughout this week, or return, recommend you see ear nose and throat. Let me know and I can make that referral. Return to the clinic or go to the nearest emergency room if any of your symptoms worsen or new symptoms occur.  Otitis Externa Otitis externa is a bacterial or fungal infection of the outer ear canal. This is the area from the eardrum to the outside of the ear. Otitis externa is sometimes called "swimmer's ear." CAUSES  Possible causes of infection include:  Swimming in dirty water.  Moisture remaining in the ear after swimming or bathing.  Mild injury (trauma) to the ear.  Objects stuck in the ear (foreign body).  Cuts or scrapes (abrasions) on the outside of the ear. SIGNS AND SYMPTOMS  The first symptom of infection is often itching in the ear canal. Later signs and symptoms may include swelling and redness of the ear canal, ear pain, and yellowish-white fluid (pus) coming from the ear. The ear pain may be worse when pulling on the earlobe. DIAGNOSIS  Your health care provider will perform a physical exam. A sample of fluid may be taken from the ear and examined for bacteria or fungi. TREATMENT  Antibiotic ear drops are often given for 10 to 14 days. Treatment may also include pain medicine or corticosteroids to reduce itching and swelling. HOME CARE INSTRUCTIONS   Apply antibiotic ear drops to the ear canal as prescribed by your health care provider.  Take medicines only as directed by your health care provider.  If you have diabetes, follow any additional  treatment instructions from your health care provider.  Keep all follow-up visits as directed by your health care provider. PREVENTION   Keep your ear dry. Use the corner of a towel to absorb water out of the ear canal after swimming or bathing.  Avoid scratching or putting objects inside your ear. This can damage the ear canal or remove the protective wax that lines the canal. This makes it easier for bacteria and fungi to grow.  Avoid swimming in lakes, polluted water, or poorly chlorinated pools.  You may use ear drops made of rubbing alcohol and vinegar after swimming. Combine equal parts of white vinegar and alcohol in a bottle. Put 3 or 4 drops into each ear after swimming. SEEK MEDICAL CARE IF:   You have a fever.  Your ear is still red, swollen, painful, or draining pus after 3 days.  Your redness, swelling, or pain gets worse.  You have a severe headache.  You have redness, swelling, pain, or tenderness in the area behind your ear. MAKE SURE YOU:   Understand these instructions.  Will watch your condition.  Will get help right away if you are not doing well or get worse.   This information is not intended to replace advice given to you by your health care provider. Make sure you discuss any questions you have with your health care provider.   Document Released: 04/05/2005 Document Revised: 04/26/2014 Document Reviewed: 04/22/2011 Elsevier Interactive Patient Education 2016 [place] Impaction The structures of the external ear canal secrete a waxy substance known as  cerumen. Excess cerumen can build up in the ear canal, causing a condition known as cerumen impaction. Cerumen impaction can cause ear pain and disrupt the function of the ear. The rate of cerumen production differs for each individual. In certain individuals, the configuration of the ear canal may decrease his or her ability to naturally remove cerumen. CAUSES Cerumen impaction is caused  by excessive cerumen production or buildup. RISK FACTORS  Frequent use of swabs to clean ears.  Having narrow ear canals.  Having eczema.  Being dehydrated. SIGNS AND SYMPTOMS  Diminished hearing.  Ear drainage.  Ear pain.  Ear itch. TREATMENT Treatment may involve:  Over-the-counter or prescription ear drops to soften the cerumen.  Removal of cerumen by a health care provider. This may be done with:  Irrigation with warm water. This is the most common method of removal.  Ear curettes and other instruments.  Surgery. This may be done in severe cases. HOME CARE INSTRUCTIONS  Take medicines only as directed by your health care provider.  Do not insert objects into the ear with the intent of cleaning the ear. PREVENTION  Do not insert objects into the ear, even with the intent of cleaning the ear. Removing cerumen as a part of normal hygiene is not necessary, and the use of swabs in the ear canal is not recommended.  Drink enough water to keep your urine clear or pale yellow.  Control your eczema if you have it. SEEK MEDICAL CARE IF:  You develop ear pain.  You develop bleeding from the ear.  The cerumen does not clear after you use ear drops as directed.   This information is not intended to replace advice given to you by your health care provider. Make sure you discuss any questions you have with your health care provider.   Document Released: 05/13/2004 Document Revised: 04/26/2014 Document Reviewed: 11/20/2014 Elsevier Interactive Patient Education [employer].     I personally performed the services described in this documentation, which was scribed in my presence. The recorded information has been reviewed and considered, and addended by me as needed.

## 2015-05-20 NOTE — Patient Instructions (Signed)
I suspect most of the problem was due to the wax or cerumen impaction. See information on this below. There was a small area of abrasion and some irritation of the canal, which can be early otitis externa. For this reason can start the drops as discussed, but if symptoms are not improving throughout this week, or return, recommend you see ear nose and throat. Let me know and I can make that referral. Return to the clinic or go to the nearest emergency room if any of your symptoms worsen or new symptoms occur.  Otitis Externa Otitis externa is a bacterial or fungal infection of the outer ear canal. This is the area from the eardrum to the outside of the ear. Otitis externa is sometimes called "swimmer's ear." CAUSES  Possible causes of infection include:  Swimming in dirty water.  Moisture remaining in the ear after swimming or bathing.  Mild injury (trauma) to the ear.  Objects stuck in the ear (foreign body).  Cuts or scrapes (abrasions) on the outside of the ear. SIGNS AND SYMPTOMS  The first symptom of infection is often itching in the ear canal. Later signs and symptoms may include swelling and redness of the ear canal, ear pain, and yellowish-white fluid (pus) coming from the ear. The ear pain may be worse when pulling on the earlobe. DIAGNOSIS  Your health care provider will perform a physical exam. A sample of fluid may be taken from the ear and examined for bacteria or fungi. TREATMENT  Antibiotic ear drops are often given for 10 to 14 days. Treatment may also include pain medicine or corticosteroids to reduce itching and swelling. HOME CARE INSTRUCTIONS   Apply antibiotic ear drops to the ear canal as prescribed by your health care provider.  Take medicines only as directed by your health care provider.  If you have diabetes, follow any additional treatment instructions from your health care provider.  Keep all follow-up visits as directed by your health care  provider. PREVENTION   Keep your ear dry. Use the corner of a towel to absorb water out of the ear canal after swimming or bathing.  Avoid scratching or putting objects inside your ear. This can damage the ear canal or remove the protective wax that lines the canal. This makes it easier for bacteria and fungi to grow.  Avoid swimming in lakes, polluted water, or poorly chlorinated pools.  You may use ear drops made of rubbing alcohol and vinegar after swimming. Combine equal parts of white vinegar and alcohol in a bottle. Put 3 or 4 drops into each ear after swimming. SEEK MEDICAL CARE IF:   You have a fever.  Your ear is still red, swollen, painful, or draining pus after 3 days.  Your redness, swelling, or pain gets worse.  You have a severe headache.  You have redness, swelling, pain, or tenderness in the area behind your ear. MAKE SURE YOU:   Understand these instructions.  Will watch your condition.  Will get help right away if you are not doing well or get worse.   This information is not intended to replace advice given to you by your health care provider. Make sure you discuss any questions you have with your health care provider.   Document Released: 04/05/2005 Document Revised: 04/26/2014 Document Reviewed: 04/22/2011 Elsevier Interactive Patient Education 2016 [place] Impaction The structures of the external ear canal secrete a waxy substance known as cerumen. Excess cerumen can build up in the  ear canal, causing a condition known as cerumen impaction. Cerumen impaction can cause ear pain and disrupt the function of the ear. The rate of cerumen production differs for each individual. In certain individuals, the configuration of the ear canal may decrease his or her ability to naturally remove cerumen. CAUSES Cerumen impaction is caused by excessive cerumen production or buildup. RISK FACTORS  Frequent use of swabs to clean ears.  Having narrow  ear canals.  Having eczema.  Being dehydrated. SIGNS AND SYMPTOMS  Diminished hearing.  Ear drainage.  Ear pain.  Ear itch. TREATMENT Treatment may involve:  Over-the-counter or prescription ear drops to soften the cerumen.  Removal of cerumen by a health care provider. This may be done with:  Irrigation with warm water. This is the most common method of removal.  Ear curettes and other instruments.  Surgery. This may be done in severe cases. HOME CARE INSTRUCTIONS  Take medicines only as directed by your health care provider.  Do not insert objects into the ear with the intent of cleaning the ear. PREVENTION  Do not insert objects into the ear, even with the intent of cleaning the ear. Removing cerumen as a part of normal hygiene is not necessary, and the use of swabs in the ear canal is not recommended.  Drink enough water to keep your urine clear or pale yellow.  Control your eczema if you have it. SEEK MEDICAL CARE IF:  You develop ear pain.  You develop bleeding from the ear.  The cerumen does not clear after you use ear drops as directed.   This information is not intended to replace advice given to you by your health care provider. Make sure you discuss any questions you have with your health care provider.   Document Released: 05/13/2004 Document Revised: 04/26/2014 Document Reviewed: 11/20/2014 Elsevier Interactive Patient Education [employer].

## 2015-05-26 ENCOUNTER — Telehealth: Payer: Self-pay

## 2015-05-26 NOTE — Telephone Encounter (Signed)
Clearance paper work received about pt getting eye surgery on 3/1. Pt notified that he needs to come in for annual appt with Dr. Gwenlyn Found before being cleareed. Pt agreed scheduled on 2/21 for pre-op clearance appt.  No questions at this time.

## 2015-06-09 ENCOUNTER — Encounter: Payer: Medicare Other | Admitting: *Deleted

## 2015-06-09 ENCOUNTER — Ambulatory Visit (INDEPENDENT_AMBULATORY_CARE_PROVIDER_SITE_OTHER): Payer: Medicare Other | Admitting: *Deleted

## 2015-06-09 DIAGNOSIS — R001 Bradycardia, unspecified: Secondary | ICD-10-CM | POA: Diagnosis not present

## 2015-06-09 DIAGNOSIS — I442 Atrioventricular block, complete: Secondary | ICD-10-CM

## 2015-06-09 LAB — CUP PACEART INCLINIC DEVICE CHECK
Battery Impedance: 185 Ohm
Battery Remaining Longevity: 109 mo
Battery Voltage: 2.79 V
Brady Statistic AP VP Percent: 83 %
Date Time Interrogation Session: 20170220112915
Implantable Lead Implant Date: 20131004
Implantable Lead Location: 753859
Implantable Lead Model: 5076
Lead Channel Impedance Value: 500 Ohm
Lead Channel Pacing Threshold Amplitude: 0.625 V
Lead Channel Pacing Threshold Amplitude: 0.75 V
Lead Channel Pacing Threshold Pulse Width: 0.4 ms
Lead Channel Pacing Threshold Pulse Width: 0.4 ms
Lead Channel Pacing Threshold Pulse Width: 0.4 ms
Lead Channel Setting Sensing Sensitivity: 2.8 mV
MDC IDC LEAD IMPLANT DT: 20131004
MDC IDC LEAD LOCATION: 753860
MDC IDC MSMT LEADCHNL RA IMPEDANCE VALUE: 485 Ohm
MDC IDC MSMT LEADCHNL RA SENSING INTR AMPL: 2 mV
MDC IDC MSMT LEADCHNL RV PACING THRESHOLD AMPLITUDE: 1 V
MDC IDC MSMT LEADCHNL RV PACING THRESHOLD AMPLITUDE: 1 V
MDC IDC MSMT LEADCHNL RV PACING THRESHOLD PULSEWIDTH: 0.4 ms
MDC IDC MSMT LEADCHNL RV SENSING INTR AMPL: 5.6 mV
MDC IDC SET LEADCHNL RA PACING AMPLITUDE: 2 V
MDC IDC SET LEADCHNL RV PACING AMPLITUDE: 2.5 V
MDC IDC SET LEADCHNL RV PACING PULSEWIDTH: 0.4 ms
MDC IDC STAT BRADY AP VS PERCENT: 1 %
MDC IDC STAT BRADY AS VP PERCENT: 17 %
MDC IDC STAT BRADY AS VS PERCENT: 0 %

## 2015-06-09 NOTE — Progress Notes (Signed)
Add on pacemaker check in clinic. Normal device function. Thresholds, sensing, impedances consistent with previous measurements. Device programmed to maximize longevity. 16 mode switches- no EGMs. No high ventricular rates noted. Device programmed at appropriate safety margins. Histogram distribution appropriate for patient activity level. Device programmed to optimize intrinsic conduction. Estimated longevity 7.5-10.5 years. Patient enrolled in remote follow-up- carelink smart difficulties- instructed patient to uninstall and re-download the app. Gave CL smart # if further guidance is needed. Pt and wife will troubleshoot and let us know at May appt with [facility] if they want to be seen in clinic q 6 months or will continue with remote follow-up.

## 2015-06-10 ENCOUNTER — Encounter: Payer: Self-pay | Admitting: Cardiovascular Disease

## 2015-06-10 ENCOUNTER — Ambulatory Visit (INDEPENDENT_AMBULATORY_CARE_PROVIDER_SITE_OTHER): Payer: Medicare Other | Admitting: Cardiovascular Disease

## 2015-06-10 VITALS — BP 110/60 | HR 81 | Ht 66.0 in | Wt 149.0 lb

## 2015-06-10 DIAGNOSIS — Z950 Presence of cardiac pacemaker: Secondary | ICD-10-CM | POA: Diagnosis not present

## 2015-06-10 DIAGNOSIS — I5022 Chronic systolic (congestive) heart failure: Secondary | ICD-10-CM

## 2015-06-10 DIAGNOSIS — I701 Atherosclerosis of renal artery: Secondary | ICD-10-CM

## 2015-06-10 DIAGNOSIS — I350 Nonrheumatic aortic (valve) stenosis: Secondary | ICD-10-CM

## 2015-06-10 DIAGNOSIS — I2510 Atherosclerotic heart disease of native coronary artery without angina pectoris: Secondary | ICD-10-CM

## 2015-06-10 NOTE — Assessment & Plan Note (Signed)
Ian [name] has an ejection fraction of 20% with moderate prosthetic aortic valve stenosis. He has New [place] Heart Association class III heart failure with orthopnea.

## 2015-06-10 NOTE — Assessment & Plan Note (Signed)
History of hypertension with blood pressure measured at 110/60. He is on Ziac. Continue current meds at current dosing

## 2015-06-10 NOTE — Assessment & Plan Note (Signed)
Status post dual-chamber permanent transvenous pacemaker insertion 01/21/12 for symptomatic second-degree heart block by Dr. Sallyanne Kuster. Because of unexplained decline in LV function he saw Dr. Rayann Heman explored the possibility of a prior IV upgrade. Given his age and comorbidities he was not a candidate for ICD implantation. The consensus was that he would not benefit from CRT therapy.

## 2015-06-10 NOTE — Assessment & Plan Note (Signed)
History of coronary artery disease status post bypass grafting in 2001 with a LIMA to his LAD. He required recatheterization 12/05/07 revealing a patent LIMA with mild disease in his RCA. I performed cardiac catheterization 06/20/14 revealing a patent LIMA to the LAD, 40-50% ostial smooth nondominant circumflex and moderate irregularities in the dominant RCA. He does get short of breath probably related to his moderate prosthetic valve aortic stenosis and severe LV dysfunction.

## 2015-06-10 NOTE — Patient Instructions (Signed)
Medication Instructions:  Your physician recommends that you continue on your current medications as directed. Please refer to the Current Medication list given to you today.   Labwork: none  Testing/Procedures: Your physician has requested that you have an echocardiogram. Echocardiography is a painless test that uses sound waves to create images of your heart. It provides your doctor with information about the size and shape of your heart and how well your heart's chambers and valves are working. This procedure takes approximately one hour. There are no restrictions for this procedure. SCHEDULE FOR June 2017    Follow-Up: Your physician wants you to follow-up in: 12 months with Dr. Gwenlyn Found. You will receive a reminder letter in the mail two months in advance. If you don't receive a letter, please call our office to schedule the follow-up appointment.   Any Other Special Instructions Will Be Listed Below (If Applicable).      If you need a refill on your cardiac medications before your next appointment, please call your pharmacy.

## 2015-06-10 NOTE — Assessment & Plan Note (Signed)
History of 90% right renal artery stenosis which we have followed by duplex ultrasound most recently performed 10/14/14. Renal dimensions have remained stable as renal aortic ratio was 4.22. At this point no intervention is anticipated

## 2015-06-10 NOTE — Assessment & Plan Note (Signed)
History of aortic valve replacement, bypass grafting in 2001. His last echo performed 05/27/14 revealed severe LV dysfunction with an ejection fraction in the 20% range. He had moderate prosthetic aortic valve stenosis with a valve area of 1.16 cm2. A dobutamine echo performed by Dr. Sallyanne Kuster in June of last year to evaluate low output/gradient aortic stenosis was unrevealing.

## 2015-06-10 NOTE — Progress Notes (Signed)
06/10/2015 Ian [name]   Nov 02, 1928  [id number]  Primary Physician Viviana Simpler, MD Primary Cardiologist: Lorretta Harp MD Renae Gloss   HPI:  The patient is an 82 year old, thin appearing, married, Caucasian male, father of three, grandfather to 57, who is accompanied by his wife today. He is formerly a patient of Dr. Durwin Nora Little's. I saw him at the time of his right and left heart cardiac catheterization which I performed 06/20/14.Marland Kitchen He has a history of coronary artery disease status post bypass grafting in 2001 with a LIMA to his LAD and a porcine valve at that time. He did have a bare metal stent placed to his circumflex coronary artery by Dr. Adrian Prows on December 05, 2007. At that time, his LIMA was patent and his RCA had only mild disease. Abdominal aortography revealed a widely patent left renal artery with a 90% right renal artery stenosis. His last Myoview performed in 2009 was nonischemic. An echo performed in 2011 revealed an aortic valve area of 0.9 cm squared with an EF of 45 to 50% and mild concentric LVH. His other problems include hypertension and hyperlipidemia. He does have progressive Parkinson's disease. When I saw him in the office back in 2013 he was significantly bradycardic and underwent permanent transvenous pacemaker insertion which is currently followed by Dr. Sallyanne Kuster. He's had progressive shortness of breath or lower extremity edema as well as unexplained weight loss. He was placed on 40 mg of Lasix which resulted in some improvement in his edema and his dyspnea. He also has a 90% right renal artery stenosis by duplex ultrasound that has remained stable as recently as 09/26/13.physical saw him in July he has had increasing dyspnea on exertion. A 2-D echo performed 05/27/14 revealed a decline in his ejection fraction to 20-25% from 50-55% November 2014 for unclear reasons. A Myoview stress test showed inferolateral ischemia. Based on this, I decided to  proceed with outpatient cardiac catheterization to define his anatomy and physiology. I performed right and left heart cath on 06/20/14 revealing a patent LIMA to the LAD and otherwise noncritical CAD. I was unable to perform a complete right heart cath because of his pacemaker leads. The etiology of his LV dysfunction was unclear after this. He did see Dr. Rayann Heman back to explore possible upgrade to CRT and/or ICD therapy both of which were advised against. He had a dobutamine echo performed by Dr. Sallyanne Kuster to evaluate for low output aortic stenosis which was inconclusive. He continues to complain of exertional shortness of breath and orthopnea. He does have a living will with a written DO NOT RESUSCITATE at home.   Current Outpatient Prescriptions  Medication Sig Dispense Refill  . aspirin 81 MG tablet Take 81 mg by mouth daily.    . bisoprolol-hydrochlorothiazide (ZIAC) 5-6.25 MG per tablet Take 1 tablet by mouth daily. 90 tablet 3  . carbidopa-levodopa (SINEMET CR) 50-200 MG tablet Take 1 tablet by mouth at bedtime. 90 tablet 1  . carbidopa-levodopa (SINEMET IR) 25-100 MG per tablet Take 2 tablets by mouth 5 (five) times daily. 900 tablet 1  . furosemide (LASIX) 40 MG tablet Take 1 tablet (40 mg total) by mouth daily. 90 tablet 3  . lansoprazole (PREVACID) 15 MG capsule Take 15 mg by mouth daily as needed (stomach).     . polyethylene glycol (MIRALAX / GLYCOLAX) packet Take 17 g by mouth daily.    . potassium chloride (K-DUR) 10 MEQ tablet Take 1 tablet (10 mEq  total) by mouth 2 (two) times daily. (Patient taking differently: Take 10 mEq by mouth daily. ) 180 tablet 3  . vitamin B-12 (CYANOCOBALAMIN) 1000 MCG tablet Take 1,000 mcg by mouth daily.     No current facility-administered medications for this visit.   Facility-Administered Medications Ordered in Other Visits  Medication Dose Route Frequency Provider Last Rate Last Dose  . DOBUTamine (DOBUTREX) 1,000 mcg/mL in dextrose 5% 250 mL  infusion  5-20 mcg/kg/min (Order-Specific) Intravenous Continuous Carlena Bjornstad, MD 40.8 mL/hr at 09/20/14 1415 10 mcg/kg/min at 09/20/14 1415    Allergies  Allergen Reactions  . Plavix [Clopidogrel]     Fatigue   . Pravastatin     Muscle aches  . Zocor [Simvastatin]     REACTION: dizziness  . Penicillins Rash    Social History   Social History  . Marital Status: Married    Spouse Name: N/A  . Number of Children: 3  . Years of Education: N/A   Occupational History  . retired- [employer]   . part-time sub/courier for schools- now only rarely    Social History Main Topics  . Smoking status: Former Smoker -- 1.00 packs/day for 5 years    Types: Cigarettes    Quit date: 06/06/1963  . Smokeless tobacco: Never Used  . Alcohol Use: No     Comment: very rare  . Drug Use: No  . Sexual Activity: No   Other Topics Concern  . Not on file   Social History Narrative   Has living will   Wife, then son [name], hold health care POA   Requests DNR --done   Requests no tube feeds if cognitively unaware     Review of Systems: General: negative for chills, fever, night sweats or weight changes.  Cardiovascular: negative for chest pain, dyspnea on exertion, edema, orthopnea, palpitations, paroxysmal nocturnal dyspnea or shortness of breath Dermatological: negative for rash Respiratory: negative for cough or wheezing Urologic: negative for hematuria Abdominal: negative for nausea, vomiting, diarrhea, bright red blood per rectum, melena, or hematemesis Neurologic: negative for visual changes, syncope, or dizziness All other systems reviewed and are otherwise negative except as noted above.    Blood pressure 110/60, pulse 81, height 5\' 6"  (1.676 m), weight 149 lb (67.586 kg).  General appearance: alert and no distress Neck: no adenopathy, no carotid bruit, no JVD, supple, symmetrical, trachea midline and thyroid not enlarged, symmetric, no  tenderness/mass/nodules Lungs: clear to auscultation bilaterally Heart: regular rate and rhythm, S1, S2 normal, no murmur, click, rub or gallop Extremities: extremities normal, atraumatic, no cyanosis or edema  EKG AV paced rhythm with a ventricular response of 81, I personally reviewed this EKG  ASSESSMENT AND PLAN:   Essential hypertension, benign History of hypertension with blood pressure measured at 110/60. He is on Ziac. Continue current meds at current dosing  Coronary artery disease without angina pectoris History of coronary artery disease status post bypass grafting in 2001 with a LIMA to his LAD. He required recatheterization 12/05/07 revealing a patent LIMA with mild disease in his RCA. I performed cardiac catheterization 06/20/14 revealing a patent LIMA to the LAD, 40-50% ostial smooth nondominant circumflex and moderate irregularities in the dominant RCA. He does get short of breath probably related to his moderate prosthetic valve aortic stenosis and severe LV dysfunction.  Aortic valve stenosis, acquired -- s/p AVR with Porcie Bioprosthetic AVR; By Echo in 05/2009 moderate stenosis,AVA 0.98cm. History of aortic valve replacement, bypass grafting in 2001.  His last echo performed 05/27/14 revealed severe LV dysfunction with an ejection fraction in the 20% range. He had moderate prosthetic aortic valve stenosis with a valve area of 1.16 cm2. A dobutamine echo performed by Dr. Sallyanne Kuster in June of last year to evaluate low output/gradient aortic stenosis was unrevealing.  Right renal artery stenosis - 90% History of 90% right renal artery stenosis which we have followed by duplex ultrasound most recently performed 10/14/14. Renal dimensions have remained stable as renal aortic ratio was 4.22. At this point no intervention is anticipated  S/P cardiac pacemaker procedure, Med[employer]onic Adapta L  ADDRL1, 01/21/12 Status post dual-chamber permanent transvenous pacemaker insertion 01/21/12 for  symptomatic second-degree heart block by Dr. Sallyanne Kuster. Because of unexplained decline in LV function he saw Dr. Rayann Heman explored the possibility of a prior IV upgrade. Given his age and comorbidities he was not a candidate for ICD implantation. The consensus was that he would not benefit from CRT therapy.  Chronic systolic CHF (congestive heart failure) [facility]) Ian [name] has an ejection fraction of 20% with moderate prosthetic aortic valve stenosis. He has [place] Heart Association class III heart failure with orthopnea.      Lorretta Harp MD FACP,FACC,FAHA, [facility] 06/10/2015 11:32 AM

## 2015-06-11 DIAGNOSIS — B078 Other viral warts: Secondary | ICD-10-CM | POA: Diagnosis not present

## 2015-06-11 DIAGNOSIS — L989 Disorder of the skin and subcutaneous tissue, unspecified: Secondary | ICD-10-CM | POA: Diagnosis not present

## 2015-06-11 DIAGNOSIS — I509 Heart failure, unspecified: Secondary | ICD-10-CM | POA: Diagnosis not present

## 2015-06-11 DIAGNOSIS — Z880 Allergy status to penicillin: Secondary | ICD-10-CM | POA: Diagnosis not present

## 2015-06-11 DIAGNOSIS — H02202 Unspecified lagophthalmos right lower eyelid: Secondary | ICD-10-CM | POA: Diagnosis not present

## 2015-06-11 DIAGNOSIS — H02205 Unspecified lagophthalmos left lower eyelid: Secondary | ICD-10-CM | POA: Diagnosis not present

## 2015-06-11 DIAGNOSIS — Z7982 Long term (current) use of aspirin: Secondary | ICD-10-CM | POA: Diagnosis not present

## 2015-06-18 DIAGNOSIS — H02535 Eyelid retraction left lower eyelid: Secondary | ICD-10-CM | POA: Diagnosis not present

## 2015-06-18 DIAGNOSIS — H02202 Unspecified lagophthalmos right lower eyelid: Secondary | ICD-10-CM | POA: Diagnosis not present

## 2015-06-18 DIAGNOSIS — B078 Other viral warts: Secondary | ICD-10-CM | POA: Diagnosis not present

## 2015-06-18 DIAGNOSIS — H02532 Eyelid retraction right lower eyelid: Secondary | ICD-10-CM | POA: Diagnosis not present

## 2015-06-18 DIAGNOSIS — Z880 Allergy status to penicillin: Secondary | ICD-10-CM | POA: Diagnosis not present

## 2015-06-18 DIAGNOSIS — D485 Neoplasm of uncertain behavior of skin: Secondary | ICD-10-CM | POA: Diagnosis not present

## 2015-06-18 DIAGNOSIS — H02205 Unspecified lagophthalmos left lower eyelid: Secondary | ICD-10-CM | POA: Diagnosis not present

## 2015-06-18 DIAGNOSIS — I509 Heart failure, unspecified: Secondary | ICD-10-CM | POA: Diagnosis not present

## 2015-06-18 DIAGNOSIS — L989 Disorder of the skin and subcutaneous tissue, unspecified: Secondary | ICD-10-CM | POA: Diagnosis not present

## 2015-06-18 NOTE — Addendum Note (Signed)
Addended by: Diana Eves on: 06/18/2015 11:31 AM   Modules accepted: Orders

## 2015-07-02 DIAGNOSIS — Z955 Presence of coronary angioplasty implant and graft: Secondary | ICD-10-CM | POA: Diagnosis not present

## 2015-07-02 DIAGNOSIS — H02535 Eyelid retraction left lower eyelid: Secondary | ICD-10-CM | POA: Diagnosis not present

## 2015-07-02 DIAGNOSIS — Z880 Allergy status to penicillin: Secondary | ICD-10-CM | POA: Diagnosis not present

## 2015-07-02 DIAGNOSIS — G20 Parkinson's disease: Secondary | ICD-10-CM | POA: Diagnosis not present

## 2015-07-02 DIAGNOSIS — I5022 Chronic systolic (congestive) heart failure: Secondary | ICD-10-CM | POA: Diagnosis not present

## 2015-07-02 DIAGNOSIS — Z888 Allergy status to other drugs, medicaments and biological substances status: Secondary | ICD-10-CM | POA: Diagnosis not present

## 2015-07-02 DIAGNOSIS — I2510 Atherosclerotic heart disease of native coronary artery without angina pectoris: Secondary | ICD-10-CM | POA: Diagnosis not present

## 2015-07-02 DIAGNOSIS — Z79899 Other long term (current) drug therapy: Secondary | ICD-10-CM | POA: Diagnosis not present

## 2015-07-02 DIAGNOSIS — Z7982 Long term (current) use of aspirin: Secondary | ICD-10-CM | POA: Diagnosis not present

## 2015-07-02 DIAGNOSIS — I110 Hypertensive heart disease with heart failure: Secondary | ICD-10-CM | POA: Diagnosis not present

## 2015-07-02 DIAGNOSIS — H029 Unspecified disorder of eyelid: Secondary | ICD-10-CM | POA: Diagnosis not present

## 2015-07-02 DIAGNOSIS — Z87891 Personal history of nicotine dependence: Secondary | ICD-10-CM | POA: Diagnosis not present

## 2015-07-02 DIAGNOSIS — Z8673 Personal history of transient ischemic attack (TIA), and cerebral infarction without residual deficits: Secondary | ICD-10-CM | POA: Diagnosis not present

## 2015-07-02 DIAGNOSIS — H02532 Eyelid retraction right lower eyelid: Secondary | ICD-10-CM | POA: Diagnosis not present

## 2015-07-02 DIAGNOSIS — Z9889 Other specified postprocedural states: Secondary | ICD-10-CM | POA: Diagnosis not present

## 2015-07-10 ENCOUNTER — Encounter: Payer: Self-pay | Admitting: Neurology

## 2015-07-10 ENCOUNTER — Ambulatory Visit (INDEPENDENT_AMBULATORY_CARE_PROVIDER_SITE_OTHER): Payer: Medicare Other | Admitting: Neurology

## 2015-07-10 VITALS — BP 130/72 | HR 85 | Ht 66.0 in | Wt 152.0 lb

## 2015-07-10 DIAGNOSIS — R9431 Abnormal electrocardiogram [ECG] [EKG]: Secondary | ICD-10-CM

## 2015-07-10 DIAGNOSIS — G20A1 Parkinson's disease without dyskinesia, without mention of fluctuations: Secondary | ICD-10-CM

## 2015-07-10 DIAGNOSIS — G20 Parkinson's disease: Secondary | ICD-10-CM | POA: Diagnosis not present

## 2015-07-10 DIAGNOSIS — I701 Atherosclerosis of renal artery: Secondary | ICD-10-CM

## 2015-07-10 DIAGNOSIS — I4581 Long QT syndrome: Secondary | ICD-10-CM | POA: Diagnosis not present

## 2015-07-10 MED ORDER — CARBIDOPA-LEVODOPA ER 50-200 MG PO TBCR: 1.0000 | EXTENDED_RELEASE_TABLET | Freq: Every day | ORAL | Status: DC

## 2015-07-10 MED ORDER — PIMAVANSERIN TARTRATE 17 MG PO TABS: 2.0000 | ORAL_TABLET | Freq: Every day | ORAL | Status: DC

## 2015-07-10 NOTE — Patient Instructions (Addendum)
1. Change Levodopa to the following:  Carbidopa Levodopa 50/200 - 1 tablet 6 times daily We will talk in 2 weeks to see how you are doing.

## 2015-07-10 NOTE — Progress Notes (Signed)
Ian [name] was seen today in the movement disorders clinic for f/u.  He is accompanied by his wife who supplements the history.  The first symptom(s) the patient noticed was tremor in the R hand when he would use the hand or have a cup of coffee in it.  His wife believes that this began in 2004.  He was referred to Dr. Tilden [name].  He tried some medication and according to his wife, he kept having adverse SE.  He was on Ian [name] and Ian [name] but reported dizziness and bad dreams with both.   He was referred to Dr. Yevonne [name] in about 2010 because he was interested in DBS but the patient was concerned about risks of the surgery and potential side effects.    09/28/12  I tried to change the levodopa back to the IR formulation but he did not like it because he was too drowsy and went back to the CR formulation.  , 2 po qid and Ian [name] 50/200 at night.    He has continued to have more tremor after the Ian [name] was d/c.  However, this has become less bothersome for him over time.  He does notice that sometimes the medication wears off quickly and sometimes it does not.  It seems inconsistent.  03/06/13 update:  The patient is accompanied by his wife who supplements the history.  Last visit, we tried to add Ian [name] to each of the 4 Ian [name] dosing times.  He called me because of nausea and we decreased it to twice a day dosing.  He continued to have nausea and the medication was discontinued.  The patient states that the medication makes his arms feel "heavy." He has stopped the Ian [name] 50/200 at night as he does not think it was beneficial.  Sometimes, he will wake up in the middle of the night and take an extra of the Ian [name] CR 25 100.  He and his wife have noted some jerking spells at night.  He sleeps very restless. I did review notes in his chart since last visit.  He went to the emergency room on September 21 with a sensation of palpitations and tachycardia.  His  pacemaker was interrogated and there were no problems and he was subsequently sent home.  06/11/13 update:  This patient is accompanied in the office by his spouse who supplements the history.  Pt is on Ian [name] 25/100 CR - 2 in the AM and 6 other dosages throughout the day.  When he awakens in the middle of the night he may take a few extra dosages and he estimates he takes a total daily levodopa dose of 1000-1200 mg per day.  He has been very senstive to medication and has not been able to tolerate the immediate release Ian [name], Ian [name] or Ian [name] (made arms/legs hurt).  Tremor feels like it is getting worse.  Vision is getting worse but he went to the eye doctor and his vision was stable.  He feels like the levodopa only lasts 1.5-2 hrs.  No falls.  Is exercising.    08/13/13 update:   This patient is accompanied in the office by his spouse who supplements the history.  Pt is on Ian [name] 25/100, and takes approximately 8-10 tablets per day.  Last visit, I also added Ian [name] to see if that would help the tremor. He does think that has helped.  He presents today to try and change to Ian [name] to see if it would last longer.  He does not wish to  try Ian [name].  He denies hallucinations, lightheadedness, falls, syncope or dyskinesia.     10/01/13 update:  Last visit, I changed the patient to Ian [name].  It turned out to be very expensive as the patient had no RX coverage, which I did not realize.  When he changed back, the IR formulation was accidentally called in instead of the CR prepartation (and he didn't tolerate the IR in the past).  The patient also didn't notice that and he isn't even sure now what he is taking but knows he is taking 2 po qid of either the CR or IR.  He is having hallucinations.  He is having swelling in the feet and legs as well.   They saw cardiology last week and were started on Ian [name] and they didn't think that it made a difference.  They are worried  about a discoloration in legs.  He has lost weight.  Had one fall on May 10.  Walking into bedroom, got lightheaded and hit his back on the dresser.  He didn't think that he hit his head at the time but now that they think that he did as he has had hallucinations since.  However, looking back at our notes, it was 5/7 when he called here to d/c the Ian [name] and changed to the IR levodopa it was 5/10 when he fell so timing was likely just coincidental as he had likely just changed back to the IR preparation.  11/12/13 update:  The patient was seen back in followup in the neurology clinic, accompanied by his wife who helps to supplement the history.  Patient has a history of Parkinson's disease.  Last visit, I changed his levodopa back to the CR preparation to see if that would help his hallucinations.  He is currently on 2 tablets by mouth 4 times per day (8am/12/4pm/8pm).  Today, the patient states that hallucinations continue.  He states that he continues to see people.   Medication helps tremor when he first takes it and then the tremor comes back before the next dose.   Last visit, I told him to discontinue his Ian [name] because of complaints of swelling.  I did review records since our last visit.  Because he noticed no changes in swelling after discontinuation of the Ian [name], he followed up with his primary care physician.  His primary care physician felt that the swelling was likely due to venous insufficiency and he recommended that the patient restart the Ian [name] and he has.  The patient does state that he doesn't think that he had any break between d/c the IR Ian [name] 25/100, starting the CR version and when he restarted the Ian [name].   The patient did see his cardiologist and he saw no cardiac reason for the edema.  He did not want to increase the patient's Ian [name] further because of complaints of orthostasis and dizziness.  No significant etiology was determined for his weight loss  either.  01/07/14 update:  The patient was seen today in followup in the neurology clinic, accompanied by his wife who helps to supplement the history.  The patient has switched back on his own from the CR version of Ian [name] to the immediate release form.  He is on 2 po 5 times per day (8am and then every 3 hours).  He is complaining about more tremor and more balance issues.  No falls but near falls.  He is off of Ian [name].  Hallucinations went away after it was d/c but tremor definitely picked up.  Leg swelling is better but not gone.  Some memory problems.  No driving since march, 2015.    05/06/14 update:  The patient is seen in f/u, accompanied by his wife who supplements the history.   He is on Ian [name] 25/100 CR, 2 po 5 times per day (8am and then every 3 hours). He continues to c/o increasing tremor.  States that he is having m. Cramping at nighttime.  His last dose is at 8pm and bedtime is at 10 pm and cramping will start waking him at 3:30 am. Asks about trying to take a combination of one CR and one IR and see how that works.   He states that his memory is "going downhill" although his wife is not quite as sure.  He doesn't drive and hasn't for a year but has not trouble with pills.    08/06/14 update:  The patient is following up today, accompanied by his wife who supplements the history.  Last visit, the patient wanted to try a combination of carbidopa levodopa 25/100 immediate release and extended release so that he was taking 1 pill of each 5 times per day; however he really never did that and is still taking a Ian [name] 25/100 IR, 2 po 5 times a day.  We did add a Ian [name] 50/200 at night to see if that would help with his cramping;  The cramping is better at night and he only seems to have it if he has swelling.  His wife mentions that memory is not quite as good as it used to be.  He has some difficulty remembering previous conversations and having word  finding difficulties.  It is not a major issue, but it is something she is noticing.  Much has happened in his other medical history since last visit.  I reviewed those records.  The patient had a nuclear stress test on 06/13/2014, demonstrating a high risk study with a large area of ischemia in the LAD artery distribution and a fixed defect in the inferior wall.  An echocardiogram demonstrated a left ventricular ejection fraction of approximately 20%, which was much decreased compared to his prior study of 50-55%.  A heart catheterization was subsequently scheduled and performed on 06/20/2014.  There was no evidence of ischemia to account for the dropped ejection fraction.  It was felt that he was likely not a candidate for an ICD and they talked about CRT and decided to hold on that.  They saw Dr. Rayann Heman yesterday.  12/03/14 update:  The patient presents today, accompanied by his wife who supplements the history.  The records that were made available to me were reviewed.  Last visit, we talked about our limited options, and ultimately decided to try a combination of Ian [name] 25/100 IR and Ian [name] 25/100 CR, one tablet each of these 5 times per day.  There was some misunderstanding and he ended up taking Ian [name] 25/100 IR in addition to Ian [name] 50/200 CR, 5 times per day.  He was then taking the Ian [name] 50/200 at night as well.  Overall, this helped the tremor but hallucinations picked up and therefore he went back to Ian [name] 25/100 IR, 2 po 5 times a day.  He is having more difficulty opening his hands in the morning.  Pts biggest c/o is vision change and he thinks that it is from his PD meds.  Been to [facility] in November and I reviewed those records and they said that they thought that it was primarily surface related  but didn't think that it was significant.  He tried eye drops for dry eye and nothing helped.  He asks me multiple times what else he  can do.  His wife also asks me about a referral for a consultation regarding focused ultrasound.  04/04/15 update:  The patient is following up today, accompanied by his wife who supplements the history.  He has a history of Parkinson's disease.  He is on Ian [name] 25/100, 2 tablets 5 times per day and he went back on Ian [name] 50/200 qhs.  Last visit, he would not levodopa was affecting his vision, although I did not.  Regardless, we decided to stop the medication for a few days.  He was not able to tolerate being off the medication because tremor was so bad and ended up restarting it fairly quickly after stopping it.  Tremor continues to be bothersome and asks if there is anything else he can do.  Depression is getting worse because of that.  No suicidal or homicidal ideation.  He is doing yoga with his wife and enjoys that.  He has had no falls but has had some near falls.  I did refer him to Dr. Sanda Klein regarding his vision change and he is going in Jan (pt moved appt back).  His wife had also requested a referral last visit to [facility] to possible focused ultrasound, but ultimately they decided that they did not want to go to the appointment.  I think that is probably a good decision as focused u/s is not indicated yet for Parkinsons disease.  They do bring in a brochure today about Ian [name] and ask me about that today.  Also saw an article in paper about study Dr. Jimmey Ralph is doing at [facility] and he called the number.  He showed me the article today and it was for patients who have had little weak body dementia and Parkinson's dementia with hallucinations.  He has begun to have a few more hallucinations since last visit, but reports that he knows that they are not real.  07/10/15 update:  The patient is following up today, accompanied by his wife who supplements the history.  He has a history of Parkinson's disease.  He is on Ian [name] 25/100, 2 tablets 5 times per day (8/11/2/5/8) in  addition to Ian [name] 50/200 at night (10pm). He thinks that the medication wears off before next visit and asks about taking the 50/200 in addition to one 25/100.  I reminded him that he accidentally tried that last august but he had more hallucinations.  He states that he just tried that recently with the 25/100 just like he did last august and didn't have the hallucinations.  He did try Neupro since our last visit and worked up to 4 mg.  He really did not find this beneficial.  He did not have side effects with that.  It did not make hallucinations worse, and in fact he thought they were potentially even better.  He did not want to go up to 6 mg and ended up discontinuing the medication.  He has not had any falls since last visit but does feel that he has been more unstable.  Sometimes will yell out at night but seems infrequent.  No hallucinations or near syncope.  I did review records since last visit.  He was seen back at [facility] and ended up having eye surgery for lower eyelid retraction bilaterally on March 1.    Neuroimaging has previously been performed.  It  is not available for my review today.  PREVIOUS MEDICATIONS: Ian [name] (reported bad dreams/dizziness on regular formulation and same with Ian [name]); requip;  on Ian [name] CR currently, Ian [name] (felt dizzy and arms felt "heavy"); Ian [name] (made arms/legs hurt); Ian [name]; Ian [name] (hallucinations); Ian [name] (costly and pt has no RX coverage); neupro  ALLERGIES:   Allergies  Allergen Reactions  . Plavix [Clopidogrel]     Fatigue   . Pravastatin     Muscle aches  . Zocor [Simvastatin]     REACTION: dizziness  . Penicillins Rash    CURRENT MEDICATIONS:  Current Outpatient Prescriptions on File Prior to Visit  Medication Sig Dispense Refill  . aspirin 81 MG tablet Take 81 mg by mouth daily.    . bisoprolol-hydrochlorothiazide (ZIAC) 5-6.25 MG per tablet Take 1 tablet by mouth daily. 90 tablet 3  . carbidopa-levodopa (Ian [name] CR)  50-200 MG tablet Take 1 tablet by mouth at bedtime. 90 tablet 1  . carbidopa-levodopa (Ian [name] IR) 25-100 MG per tablet Take 2 tablets by mouth 5 (five) times daily. 900 tablet 1  . furosemide (Ian [name]) 40 MG tablet Take 1 tablet (40 mg total) by mouth daily. 90 tablet 3  . lansoprazole (PREVACID) 15 MG capsule Take 15 mg by mouth daily as needed (stomach).     . polyethylene glycol (MIRALAX / GLYCOLAX) packet Take 17 g by mouth daily.    . potassium chloride (K-DUR) 10 MEQ tablet Take 1 tablet (10 mEq total) by mouth 2 (two) times daily. (Patient taking differently: Take 10 mEq by mouth daily. ) 180 tablet 3  . vitamin B-12 (CYANOCOBALAMIN) 1000 MCG tablet Take 1,000 mcg by mouth daily.     Current Facility-Administered Medications on File Prior to Visit  Medication Dose Route Frequency Provider Last Rate Last Dose  . DOBUTamine (DOBUTREX) 1,000 mcg/mL in dextrose 5% 250 mL infusion  5-20 mcg/kg/min (Order-Specific) Intravenous Continuous Carlena Bjornstad, MD 40.8 mL/hr at 09/20/14 1415 10 mcg/kg/min at 09/20/14 1415    PAST MEDICAL HISTORY:   Past Medical History  Diagnosis Date  . CAD (coronary artery disease) CABG x1 2001; PCI 2008    a. s/p CABG with LIMA-LAD in 2001 with AVR. b. BMS to LCx 8/08. c. Abnl nuc/dec EF 05/2014 - s/p cath with patent LIMA to LAD, patent dominant right, mild LCx disease, patent stent. Med rx.  . Allergy   . Diverticulitis   . GERD (gastroesophageal reflux disease)   . Hypertension   . Osteoarthritis   . Osteoporosis   . MALT lymphoma ([place])   . Parkinson's disease   . TIA (transient ischemic attack) 06/08    TIA  . Hyperlipidemia   . ED (erectile dysfunction)   . Aortic valve stenosis, acquired 2001    a. s/p Porcine AVR 2001.   Marland Kitchen Renal artery stenosis (HCC)     a. 90% RRA stenosis previously. b. Duplex 09/2013: R renal artery 60-99%, L renal artery 1-59% - f/u due 09/2014.  . S/P cardiac pacemaker procedure, Medtronic Adapta L  ADDRL1, 01/21/12 01/22/2012  .  Symptomatic bradycardia     a. s/p Medtronic pacer 2013.  Marland Kitchen Chronic systolic CHF (congestive heart failure) (HCC)     a. EF 50-55% in 02/2013 -> EF 20-25% by 2D Echo 05/27/14, Dr. Gwenlyn Found did not think ischemically mediated based on cath 06/2014.    PAST SURGICAL HISTORY:   Past Surgical History  Procedure Laterality Date  . Coronary artery bypass graft  2001    Lucianne Lei trigt ) aortic  valve replacement 2001; LIMA-LAD.  Marland Kitchen Esophagogastroduodenoscopy      multiple, Colon/ EGD benign 11/2004  . Pilonidal cyst / sinus excision  1953  . Inguinal hernia repair      LIH 1997Llap. bilateral hernias 1998  . Coronary stent placement  11/2007    8/09  Left Circumflex Stent by Dr Toy Care started and aggrenox stopped  . Cataract extraction  06/09    left  . Laparoscopic cholecystectomy  07/11    Dr.Rosenbower  . Squamous cell carcinoma excision  3/13    back  . Aortic valve replacement  2001    Porcine valve  . Pacemaker insertion  01/21/2012    Medtronic Adapta L implanted for complete heart block  . Nm myocar perf wall motion  11/22/2007    mild septal ischemia,EF 66%  . Permanent pacemaker insertion N/A 01/21/2012    Procedure: PERMANENT PACEMAKER INSERTION;  Surgeon: Thompson Grayer, MD;  Location[facility] C[facility] LAB;  Service: Cardiovascular;  Laterality: N/A;  . Cardiac catheterization  06/20/2014    Procedure: CORONARY/GRAFT ANGIOGRAPHY;  Surgeon: Lorretta Harp, MD;  Location[facility] CATH LAB;  Service: Cardiovascular;;    SOCIAL HISTORY:   Social History   Social History  . Marital Status: Married    Spouse Name: N/A  . Number of Children: 3  . Years of Education: N/A   Occupational History  . retired- [employer]   . part-time sub/courier for schools- now only rarely    Social History Main Topics  . Smoking status: Former Smoker -- 1.00 packs/day for 5 years    Types: Cigarettes    Quit date: 06/06/1963  . Smokeless tobacco: Never Used  . Alcohol Use: No     Comment: very rare   . Drug Use: No  . Sexual Activity: No   Other Topics Concern  . Not on file   Social History Narrative   Has living will   Wife, then son [name], hold health care POA   Requests DNR --done   Requests no tube feeds if cognitively unaware    FAMILY HISTORY:   Family Status  Relation Status Death Age  . Mother Deceased 11    old age  . Father Deceased 42    acute bronchitis, cotton exposure  . Sister Deceased     trauma  . Brother Deceased     2, deceased  . Daughter Deceased 38    osteosarcoma  . Child Alive     3, healthy  . Brother Alive     healthy  . Sister Alive     healthy    ROS:    A complete 10 system review of systems was obtained and was unremarkable apart from what is mentioned above.  PHYSICAL EXAMINATION:    VITALS:   Filed Vitals:   07/10/15 1046  BP: 130/72  Pulse: 85  Height: 5\' 6"  (1.676 m)  Weight: 152 lb (68.947 kg)   Wt Readings from Last 3 Encounters:  07/10/15 152 lb (68.947 kg)  06/10/15 149 lb (67.586 kg)  05/20/15 153 lb (69.4 kg)     GEN:  The patient appears stated age and is in NAD.   He is a bit tearful when talking about cardiac status. HEENT:  Normocephalic, atraumatic.  The mucous membranes are moist. The superficial temporal arteries are without ropiness or tenderness. CV:  RRR Lungs:  CTAB Neck/HEME:  There are no carotid bruits bilaterally.  Neurological examination:  Orientation: The patient is  alert and oriented x3.  He is a very good historian today. Montreal Cognitive Assessment  12/03/2014  Visuospatial/ Executive (0/5) 2  Naming (0/3) 3  Attention: Read list of digits (0/2) 1  Attention: Read list of letters (0/1) 0  Attention: Serial 7 subtraction starting at 100 (0/3) 2  Language: Repeat phrase (0/2) 2  Language : Fluency (0/1) 0  Abstraction (0/2) 1  Delayed Recall (0/5) 2  Orientation (0/6) 5  Total 18  Adjusted Score (based on education) 18    Movement examination: Tone: There is normal  tone in the bilateral upper extremities today.  The tone in the lower extremities is normal.  Abnormal movements: There is a moderate resting tremor bilaterally, R>L.  No dyskinesia. Coordination:  There is no decremation, with any form of RAMS, including alternating supination and pronation of the forearm, hand opening and closing, finger taps, heel taps and toe taps. Gait and Station: The patient has mild difficulty arising out of a deep-seated chair without the use of the hands. The patient's stride length is normal.  He has mild postural instability.    Lab Results  Component Value Date   WBC 10.7* 06/17/2014   HGB 14.0 06/17/2014   HCT 41.8 06/17/2014   MCV 90.9 06/17/2014   PLT 328 06/17/2014     Chemistry      Component Value Date/Time   NA 140 06/17/2014 1533   K 4.5 06/17/2014 1533   CL 99 06/17/2014 1533   CO2 33* 06/17/2014 1533   BUN 19 06/17/2014 1533   CREATININE 0.89 06/17/2014 1533   CREATININE 0.73 01/07/2013 0653      Component Value Date/Time   CALCIUM 9.5 06/17/2014 1533   ALKPHOS 116 09/05/2013 1329   AST 15 09/05/2013 1329   ALT 10 09/05/2013 1329   BILITOT 0.8 09/05/2013 1329     Lab Results  Component Value Date   VITAMINB12 660 03/06/2013   Lab Results  Component Value Date   TSH 4.349 06/17/2014     ASSESSMENT/PLAN:  1.  Idiopathic Parkinsons disease.    -Long discussion again with the patient and his wife.  He wants to change the Ian [name] 50/200, 1 tablet 5 times per day in addition to Ian [name] 25/100, one tablet 5 times per day and continue the 50/200 at bedtime.  I am very leery about this.  He accidentally made this change last August and had more hallucinations.  He reports that he tried this recently and did not have more hallucinations.  I told him we could try the Ian [name] 50/200 one tablet 5 times per day in addition to the bed time dosage, but I would like him to hold the additional Ian [name]  25/100 for now.  This does not change his overall levodopa load.  I will call him in 2 weeks.  If he still feels that he needs extra levodopa and is not having more hallucinations, then potentially we can add the Ian [name] 25/100 and we can consider Nuplazid if hallucinations are a big deal.  I did review his EKG, however, after he left and he does have a prolonged QT interval, which likely would exclude the consideration for Nuplazid.  He is not a DBS candidate because of other medical issues. 2.  Mild cognitive impairment, likely from longstanding PD.  -I think this is from long-standing Parkinson's disease.  We talked about the acetylcholinesterase inhibitors.  He really is not interested in Exelon pills because of the risk of nausea.  We  talked about the Exelon patches but they're quite expensive.  I think Aricept would work as well but he just doesn't want another med right now.  As above, he has been having more hallucinations lately and I am a little worried that the Neupro may pick this up.  He will let me know. 3.  B12 deficiency.  -He is on oral supplements  4.  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  -He could not tolerate Ian [name].  Is overall mild and talked about home safety 5.  I. will see him back in the next few months, sooner should new neurologic issues arise.  Much greater than 50% of this visit was spent in counseling with the patient and the family.  Total face to face time:  25 min

## 2015-07-24 ENCOUNTER — Telehealth: Payer: Self-pay | Admitting: Neurology

## 2015-07-24 NOTE — Telephone Encounter (Signed)
Don't add more than 3 of those per day

## 2015-07-24 NOTE — Telephone Encounter (Signed)
Left message on machine for patient to call back. To see how he is doing on change in Levodopa. Awaiting call back.

## 2015-07-24 NOTE — Telephone Encounter (Signed)
Spoke with patient and he states he is doing pretty well on Carbidopa Levodopa 50/200 CR - 1 tablet 5 times daily. He is having no increase in hallucinations. The only time he has trouble is when he tries to eat. Tremor seems worse at these times. Effects of medication are lasting about 2 hours. Dr. Carles Collet - Please advise if he should change anything. You had mentioned in your last note about adding some Carbidopa Levodopa 25/100 IR if needed.

## 2015-07-24 NOTE — Telephone Encounter (Signed)
Patient made aware.

## 2015-07-24 NOTE — Telephone Encounter (Signed)
Patient called back and left voicemail. Called him again. Awaiting call back.

## 2015-08-06 ENCOUNTER — Telehealth: Payer: Self-pay | Admitting: Cardiovascular Disease

## 2015-08-06 NOTE — Telephone Encounter (Signed)
Spoke with pt and wife. States his breathing becomes more labored with exertion at times.  His lower extremities are slightly more edematous that usual. Weight is 145lb yesterday and has not increased. The SOB comes and goes depending on activity and is slightly greater in the morning.  Advised pt to continue daily weights and to take one extra dose of his furosemide this evening.  Instructed to call us if his symptoms do not improve. Scheduled pt with Ignacia Bayley, PA on 08/19/15 for evaluation.  Routing to Dr Gwenlyn Found for further recommendations if needed.

## 2015-08-06 NOTE — Telephone Encounter (Signed)
New message    Ian [name] calling for her husband rn to return call  She states she is aware that he will always have a issue with his breathing because of his heart and the dr knows that, but  Pt wife wants a prescription to relieve his difficulties when he is breathing deeply

## 2015-08-18 ENCOUNTER — Other Ambulatory Visit: Payer: Self-pay | Admitting: Cardiology

## 2015-08-18 NOTE — Telephone Encounter (Signed)
Rx(s) sent to pharmacy electronically.  

## 2015-08-19 ENCOUNTER — Encounter: Payer: Self-pay | Admitting: Nurse Practitioner

## 2015-08-19 ENCOUNTER — Ambulatory Visit (INDEPENDENT_AMBULATORY_CARE_PROVIDER_SITE_OTHER): Payer: Medicare Other | Admitting: Nurse Practitioner

## 2015-08-19 VITALS — BP 118/66 | HR 58 | Ht 66.0 in | Wt 151.0 lb

## 2015-08-19 DIAGNOSIS — I701 Atherosclerosis of renal artery: Secondary | ICD-10-CM

## 2015-08-19 DIAGNOSIS — E785 Hyperlipidemia, unspecified: Secondary | ICD-10-CM | POA: Insufficient documentation

## 2015-08-19 DIAGNOSIS — I5042 Chronic combined systolic (congestive) and diastolic (congestive) heart failure: Secondary | ICD-10-CM | POA: Diagnosis not present

## 2015-08-19 DIAGNOSIS — I2510 Atherosclerotic heart disease of native coronary artery without angina pectoris: Secondary | ICD-10-CM | POA: Diagnosis not present

## 2015-08-19 DIAGNOSIS — Z953 Presence of xenogenic heart valve: Secondary | ICD-10-CM

## 2015-08-19 DIAGNOSIS — Z954 Presence of other heart-valve replacement: Secondary | ICD-10-CM

## 2015-08-19 DIAGNOSIS — I350 Nonrheumatic aortic (valve) stenosis: Secondary | ICD-10-CM

## 2015-08-19 DIAGNOSIS — I10 Essential (primary) hypertension: Secondary | ICD-10-CM | POA: Insufficient documentation

## 2015-08-19 NOTE — Progress Notes (Signed)
Thanks, Chris 

## 2015-08-19 NOTE — Progress Notes (Signed)
Office Visit    Patient Name: Ian [name] Date of Encounter: 08/19/2015  Primary Care Provider:  Viviana Simpler, MD Primary Cardiologist:  Adora Fridge, MD / Jerilynn Mages. Croitoru, MD  Chief Complaint    82 year old male with a history of nonischemic cardiomyopathy, chronic combined heart failure, aortic stenosis, CAD, and Parkinson's, who presents for follow-up related to ongoing dyspnea.  Past Medical History    Past Medical History  Diagnosis Date  . CAD (coronary artery disease)     a. s/p CABG with LIMA-LAD in 2001 with AVR. b. BMS to LCx 8/08. c. Abnl nuc/dec EF 05/2014 - s/p cath with patent LIMA to LAD, patent dominant right, mild LCx disease, patent stent. Med rx.  . Allergy   . Diverticulitis   . GERD (gastroesophageal reflux disease)   . Essential hypertension   . Osteoarthritis   . Osteoporosis   . MALT lymphoma ([place])   . Parkinson's disease   . TIA (transient ischemic attack) 06/08    a. 09/2006  . Hyperlipidemia   . ED (erectile dysfunction)   . Aortic valve stenosis, acquired     a. s/p Porcine AVR 2001;  b. 09/2014 Dobut Echo: mod-sev bioprosthetic AS w/ minimal change in CO and only mild increase in gradients w/ dobutamine.   . Renal artery stenosis (HCC)     a. 90% RRA stenosis previously. b. Duplex 09/2014: R renal artery 60-99%, L renal artery 1-59%.  . S/P cardiac pacemaker procedure, Medtronic Adapta L  ADDRL1, 01/21/12     a. 01/2012 s/p MDT [place] PPM (ser # [id number]).  . Symptomatic bradycardia     a. 01/2012 s/p MDT [place] PPM (ser # [id number]).  . Chronic combined systolic (congestive) and diastolic (congestive) heart failure (HCC)     a. EF 50-55% in 02/2013;  b. 05/2014 Echo: EF 20-25%, Gr1 DD.  Marland Kitchen Nonischemic cardiomyopathy ([place])     a. 05/2014 Echo: EF 20-25%-->f/u cath w/ nonobs dzs.   Past Surgical History  Procedure Laterality Date  . Coronary artery bypass graft  2001    Lucianne Lei trigt ) aortic valve replacement 2001; LIMA-LAD.    Marland Kitchen Esophagogastroduodenoscopy      multiple, Colon/ EGD benign 11/2004  . Pilonidal cyst / sinus excision  1953  . Inguinal hernia repair      LIH 1997Llap. bilateral hernias 1998  . Coronary stent placement  11/2007    8/09  Left Circumflex Stent by Dr Toy Care started and aggrenox stopped  . Cataract extraction  06/09    left  . Laparoscopic cholecystectomy  07/11    Dr.Rosenbower  . Squamous cell carcinoma excision  3/13    back  . Aortic valve replacement  2001    Porcine valve  . Pacemaker insertion  01/21/2012    Medtronic Adapta L implanted for complete heart block  . Nm myocar perf wall motion  11/22/2007    mild septal ischemia,EF 66%  . Permanent pacemaker insertion N/A 01/21/2012    Procedure: PERMANENT PACEMAKER INSERTION;  Surgeon: Thompson Grayer, MD;  Location: [facility] CATH LAB;  Service: Cardiovascular;  Laterality: N/A;  . Cardiac catheterization  06/20/2014    Procedure: CORONARY/GRAFT ANGIOGRAPHY;  Surgeon: Lorretta Harp, MD;  Location: [facility] CATH LAB;  Service: Cardiovascular;;  . Eye surgery  06/18/15    Allergies  Allergies  Allergen Reactions  . Plavix [Clopidogrel]     Fatigue   . Pravastatin     Muscle aches  .  Zocor [Simvastatin]     REACTION: dizziness  . Penicillins Rash    History of Present Illness    82 year old male with the above complex past medical history. He is status post CABG 1 with a bioprosthetic aortic valve replacement in 2001. More recently, he underwent Medtronic dual-chamber permanent pacemaker placement in 2013 secondary to symptomatically bradycardia. In 2016, he was found to have new LV dysfunction with an EF of 20-25%. This was followed by diagnostic catheterization revealing patent LIMA to the LAD with otherwise nonobstructive CAD. He was seen by electrophysiology for consideration of ICD therapy along with upgrade to a biventricular device. Ultimately, given comorbidities, he was not considered to be an ICD candidate and it wasn't  clear that upgrade to a CRT P would improve his outcome. In June 2016, he underwent dobutamine echo to assess his gradient across the aortic valve at a higher output. This study showed only minimal change in cardiac output and only mild increase in aortic valve gradients. It was felt that ultimately, this are present and moderate severe bioprosthetic aortic valve stenosis but in the absence of increasing cardiac output, the study was unable to assess whether or not this represented true low gradient severe aortic stenosis.  Mr. Villar has since had some degree of chronic dyspnea on exertion, stating that he is usually able to walk about 2 blocks prior to having to rest. He says that since before his last visit in February, he has had some progression of dyspnea on exertion, especially in the past 4-6 weeks. At this point, he can walk about 1 block prior to experiencing dyspnea on exertion.  He describes his dyspnea as feeling as though there is "a backup of air in his chest" with associated pressure and sensation as though he cannot get one satisfying breath.   His weight has been stable and he has not had any significant lower extremity edema. Further, he has not been having orthopnea, PND, presyncope, syncope, or early satiety.   Home Medications    Prior to Admission medications   Medication Sig Start Date End Date Taking? Authorizing Provider  aspirin 81 MG tablet Take 81 mg by mouth daily.   Yes Historical Provider, MD  bisoprolol-hydrochlorothiazide (ZIAC) 5-6.25 MG per tablet Take 1 tablet by mouth daily. 11/28/14  Yes Mihai Croitoru, MD  carbidopa-levodopa (SINEMET CR) 50-200 MG tablet Take 1 tablet by mouth 6 (six) times daily. 07/10/15  Yes Rebecca S Tat, DO  carbidopa-levodopa (SINEMET IR) 25-100 MG per tablet Take 2 tablets by mouth 5 (five) times daily. 08/22/14  Yes Rebecca S Tat, DO  furosemide (LASIX) 40 MG tablet Take 1 tablet (40 mg total) by mouth daily. 08/18/15  Yes Lorretta Harp, MD    lansoprazole (PREVACID) 15 MG capsule Take 15 mg by mouth at bedtime. gerd   Yes Historical Provider, MD  polyethylene glycol (MIRALAX / GLYCOLAX) packet Take 17 g by mouth daily.   Yes Historical Provider, MD  potassium chloride (K-DUR,KLOR-CON) 10 MEQ tablet Take 10 mEq by mouth 2 (two) times daily.   Yes Historical Provider, MD  vitamin B-12 (CYANOCOBALAMIN) 1000 MCG tablet Take 1,000 mcg by mouth daily.   Yes Historical Provider, MD    Review of Systems    As above, he has been experiencing dyspnea on exertion which has progressed.  He denies palpitations, PND, orthopnea, dizziness, syncope, edema, or early satiety. All other systems reviewed and are otherwise negative except as noted above.  Physical Exam  VS:  BP 118/66 mmHg  Pulse 58  Ht 5\' 6"  (1.676 m)  Wt 151 lb (68.493 kg)  BMI 24.38 kg/m2 , BMI Body mass index is 24.38 kg/(m^2). GEN: Well nourished, well developed, in no acute distress. HEENT: normal. Neck: Supple, no JVD, carotid bruits, or masses. Cardiac: RRR, no rubs, or gallops.  3/6 systolic ejection murmur at the upper sternal border with audible S2. Baseline tremor. No clubbing, cyanosis, edema.  Radials/DP/PT 2+ and equal bilaterally.  Respiratory:  Respirations regular and unlabored, clear to auscultation bilaterally. GI: Soft, nontender, nondistended, BS + x 4. MS: no deformity or atrophy. Skin: warm and dry, no rash. Neuro:  Strength and sensation are intact. Resting tremor. Psych: Normal affect.  Accessory Clinical Findings    ECG - A-V paced with occasional PVCs, 66, no acute ST or T changes.  Assessment & Plan    1.  Mixed ischemic and nonischemic cardiomyopathy/chronic combined systolic and diastolic congestive heart failure: Patient was found to have newly reduced LV function by echocardiography in 2016 with an EF of 20-25%. Follow-up catheterization showed nonobstructive disease. To be immune echo in June 2016 failed to clearly show a low output/low  gradient severe aortic stenosis. He has had some progression of dyspnea over the past 4-6 weeks in the absence of other evidence of volume overload. This was particularly bad about 2 weeks ago but has since returned to a baseline level of dyspnea after walking about 1 block. He is euvolemic on exam today. I will arrange for follow-up echocardiogram to reevaluate his aortic valve and LV function. He remains on beta blocker and diuretic therapy. He is not on ACE inhibitor secondary to renal artery stenosis. He has follow-up with Dr. Sallyanne Kuster on May 23.  2. Moderate to severe bioprosthetic valve aortic stenosis: As above, he is status post bioprosthetic aortic valve replacement in 2001 with moderate to severe aortic stenosis by dobutamine echo last summer. That study did not clearly show a low output/low gradient severe aortic stenosis As result he has been medically managed. He has had progression of dyspnea on exertion. Plan on repeat echocardiogram as above. Further decisions pending echo.  Question candidacy for transcatheter aortic valve replacement.  3. Coronary artery disease: Status post coronary artery bypass grafting 1 with a LIMA to the LAD in 2001 at the time of his aortic valve replacement. He underwent catheterization if it were 2016 revealing patency of the LIMA to the LAD as well as otherwise nonobstructive CAD. As above, in the setting of dyspnea exertion he reports what he describes as a "backup of air" in his chest which on further questioning sounds like chest pressure. It's unclear if this could potentially represent angina versus progression of aortic stenosis. Given catheterization last year, I will hold off on ischemic evaluation at this time and instead await echo findings. Continue aspirin and beta blocker therapy. He is intolerant to statins.   4. Essential hypertension: Blood pressures been stable.  5. Disposition: Follow-up 2-D echocardiogram. He already has follow-up with Dr.  Sallyanne Kuster scheduled on May 23 at which point, echo findings can be reviewed.  Murray Hodgkins, NP 08/19/2015, 1:24 PM

## 2015-08-19 NOTE — Patient Instructions (Signed)
Please schedule echo this week or next week   Keep follow up with Dr. Sallyanne Kuster

## 2015-08-22 ENCOUNTER — Other Ambulatory Visit: Payer: Self-pay

## 2015-08-22 ENCOUNTER — Ambulatory Visit (HOSPITAL_COMMUNITY): Payer: Medicare Other | Attending: Cardiology

## 2015-08-22 DIAGNOSIS — I350 Nonrheumatic aortic (valve) stenosis: Secondary | ICD-10-CM | POA: Insufficient documentation

## 2015-08-22 DIAGNOSIS — I77810 Thoracic aortic ectasia: Secondary | ICD-10-CM | POA: Insufficient documentation

## 2015-08-22 DIAGNOSIS — I509 Heart failure, unspecified: Secondary | ICD-10-CM | POA: Diagnosis not present

## 2015-08-22 DIAGNOSIS — I429 Cardiomyopathy, unspecified: Secondary | ICD-10-CM | POA: Insufficient documentation

## 2015-08-22 DIAGNOSIS — G20 Parkinson's disease: Secondary | ICD-10-CM | POA: Diagnosis not present

## 2015-08-22 DIAGNOSIS — Z953 Presence of xenogenic heart valve: Secondary | ICD-10-CM | POA: Insufficient documentation

## 2015-08-22 DIAGNOSIS — I352 Nonrheumatic aortic (valve) stenosis with insufficiency: Secondary | ICD-10-CM | POA: Diagnosis not present

## 2015-08-22 DIAGNOSIS — I340 Nonrheumatic mitral (valve) insufficiency: Secondary | ICD-10-CM | POA: Diagnosis not present

## 2015-08-22 DIAGNOSIS — I071 Rheumatic tricuspid insufficiency: Secondary | ICD-10-CM | POA: Diagnosis not present

## 2015-08-22 DIAGNOSIS — I517 Cardiomegaly: Secondary | ICD-10-CM | POA: Insufficient documentation

## 2015-08-22 DIAGNOSIS — I2510 Atherosclerotic heart disease of native coronary artery without angina pectoris: Secondary | ICD-10-CM | POA: Diagnosis not present

## 2015-08-22 DIAGNOSIS — E785 Hyperlipidemia, unspecified: Secondary | ICD-10-CM | POA: Insufficient documentation

## 2015-09-09 ENCOUNTER — Encounter: Payer: Self-pay | Admitting: Cardiovascular Disease

## 2015-09-09 ENCOUNTER — Ambulatory Visit (INDEPENDENT_AMBULATORY_CARE_PROVIDER_SITE_OTHER): Payer: Medicare Other | Admitting: Cardiovascular Disease

## 2015-09-09 VITALS — BP 139/75 | HR 72 | Ht 66.0 in | Wt 148.8 lb

## 2015-09-09 DIAGNOSIS — I10 Essential (primary) hypertension: Secondary | ICD-10-CM

## 2015-09-09 DIAGNOSIS — I350 Nonrheumatic aortic (valve) stenosis: Secondary | ICD-10-CM

## 2015-09-09 DIAGNOSIS — R001 Bradycardia, unspecified: Secondary | ICD-10-CM

## 2015-09-09 DIAGNOSIS — Z950 Presence of cardiac pacemaker: Secondary | ICD-10-CM

## 2015-09-09 DIAGNOSIS — I442 Atrioventricular block, complete: Secondary | ICD-10-CM

## 2015-09-09 DIAGNOSIS — I701 Atherosclerosis of renal artery: Secondary | ICD-10-CM

## 2015-09-09 DIAGNOSIS — I5042 Chronic combined systolic (congestive) and diastolic (congestive) heart failure: Secondary | ICD-10-CM | POA: Diagnosis not present

## 2015-09-09 DIAGNOSIS — I2510 Atherosclerotic heart disease of native coronary artery without angina pectoris: Secondary | ICD-10-CM | POA: Diagnosis not present

## 2015-09-09 DIAGNOSIS — Z79899 Other long term (current) drug therapy: Secondary | ICD-10-CM | POA: Diagnosis not present

## 2015-09-09 LAB — CUP PACEART INCLINIC DEVICE CHECK
Battery Remaining Longevity: 104 mo
Brady Statistic AP VP Percent: 77 %
Brady Statistic AS VP Percent: 22 %
Brady Statistic AS VS Percent: 0 %
Implantable Lead Implant Date: 20131004
Implantable Lead Implant Date: 20131004
Implantable Lead Location: 753859
Lead Channel Impedance Value: 550 Ohm
Lead Channel Pacing Threshold Amplitude: 1 V
Lead Channel Sensing Intrinsic Amplitude: 1.4 mV
Lead Channel Setting Pacing Pulse Width: 0.4 ms
Lead Channel Setting Sensing Sensitivity: 2.8 mV
MDC IDC LEAD LOCATION: 753860
MDC IDC MSMT BATTERY IMPEDANCE: 233 Ohm
MDC IDC MSMT BATTERY VOLTAGE: 2.79 V
MDC IDC MSMT LEADCHNL RA IMPEDANCE VALUE: 513 Ohm
MDC IDC MSMT LEADCHNL RA PACING THRESHOLD AMPLITUDE: 0.875 V
MDC IDC MSMT LEADCHNL RA PACING THRESHOLD PULSEWIDTH: 0.4 ms
MDC IDC MSMT LEADCHNL RV PACING THRESHOLD PULSEWIDTH: 0.4 ms
MDC IDC SESS DTM: 20170523130604
MDC IDC SET LEADCHNL RA PACING AMPLITUDE: 2 V
MDC IDC SET LEADCHNL RV PACING AMPLITUDE: 2.5 V
MDC IDC STAT BRADY AP VS PERCENT: 1 %

## 2015-09-09 MED ORDER — POTASSIUM CHLORIDE ER 10 MEQ PO TBCR: 20.0000 meq | EXTENDED_RELEASE_TABLET | Freq: Two times a day (BID) | ORAL | Status: DC

## 2015-09-09 MED ORDER — FUROSEMIDE 80 MG PO TABS: 80.0000 mg | ORAL_TABLET | Freq: Every day | ORAL | Status: DC

## 2015-09-09 NOTE — Progress Notes (Signed)
Patient ID: Ian [name], male   DOB: 1928/08/28, 82 y.o.   MRN: [id number]    Cardiology Office Note    Date:  09/10/2015   ID:  Ian [name], DOB [birth year], MRN [id number]  PCP:  Viviana Simpler, MD  Cardiologist:  Quay Burow, MD; Sanda Klein, MD   Chief Complaint  Patient presents with  . Follow-up    pacer check  . Shortness of Breath  . Dizziness    History of Present Illness:  Ian [name] is a 82 y.o. male with complete heart block/pacemaker dependent, here for pacemaker check.  Ian [name] has extensive cardiac problems including a dual-chamber permanent pacemaker for severe bradycardia secondary to 2:1 AV block, 15 years status post aortic valve replacement with a biological prosthesis and coronary disease(single-vessel LIMA to LAD in 2001, stents to the circumflex coronary artery in 2009). 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 The most recent study performed just 2 weeks ago showed a mean gradient of 18 mm Hg and a calculated valve area of around 1.2 cm square, essentially unchanged from 2014. LVEF is severely reduced at 25-30%. Ejection fraction decreased sharply between November 2014 and February 2016. Angiography showed no evidence of new coronary lesions and the LIMA to LAD was widely patent. Previous attempts to clarify whether he has low gradient aortic stenosis or equivocal since his cardiac output did not increase with intravenous dobutamine. He was referred for discussion of CRT upgrade and it seems that there was no conviction that this would improve outcome. He has an approximately 90% stenosis of the right renal artery that has been stable in severity for years.   He notes steadily deteriorating stamina. He is now short of breath after walking one block (down from 2 blocks about a year ago). It is more concerning that he seems to describe some degree of orthopnea. He sleeps on 2, sometimes 3 pillows.  Activities limited by dyspnea as well as parkinsonism. He remains very clearheaded and has no evidence of dementia. He denies angina pectoris and does not have palpitations or syncope or dizziness.  Pacemaker interrogation shows normal device function. Estimated generator longevity is 8.5 years. He has 79% atrial pacing and virtually 100% ventricular pacing. There has been 14 second episode of nonsustained ventricular tachycardia at about on his 75 bpm, asymptomatic. Lead parameters are excellent. No atrial fibrillation has been documented.   Past Medical History  Diagnosis Date  . CAD (coronary artery disease)     a. s/p CABG with LIMA-LAD in 2001 with AVR. b. BMS to LCx 8/08. c. Abnl nuc/dec EF 05/2014 - s/p cath with patent LIMA to LAD, patent dominant right, mild LCx disease, patent stent. Med rx.  . Allergy   . Diverticulitis   . GERD (gastroesophageal reflux disease)   . Essential hypertension   . Osteoarthritis   . Osteoporosis   . MALT lymphoma ([place])   . Parkinson's disease   . TIA (transient ischemic attack) 06/08    a. 09/2006  . Hyperlipidemia   . ED (erectile dysfunction)   . Aortic valve stenosis, acquired     a. s/p Porcine AVR 2001;  b. 09/2014 Dobut Echo: mod-sev bioprosthetic AS w/ minimal change in CO and only mild increase in gradients w/ dobutamine.   . Renal artery stenosis (HCC)     a. 90% RRA stenosis previously. b. Duplex 09/2014: R renal artery 60-99%, L renal artery 1-59%.  . S/P cardiac pacemaker procedure, Medtronic  Adapta L  ADDRL1, 01/21/12     a. 01/2012 s/p MDT [place] PPM (ser # [id number]).  . Symptomatic bradycardia     a. 01/2012 s/p MDT [place] PPM (ser # [id number]).  . Chronic combined systolic (congestive) and diastolic (congestive) heart failure (HCC)     a. EF 50-55% in 02/2013;  b. 05/2014 Echo: EF 20-25%, Gr1 DD.  Marland Kitchen Nonischemic cardiomyopathy ([place])     a. 05/2014 Echo: EF 20-25%-->f/u cath w/ nonobs dzs.    Past Surgical  History  Procedure Laterality Date  . Coronary artery bypass graft  2001    Ian [name] trigt ) aortic valve replacement 2001; LIMA-LAD.  Marland Kitchen Esophagogastroduodenoscopy      multiple, Colon/ EGD benign 11/2004  . Pilonidal cyst / sinus excision  1953  . Inguinal hernia repair      LIH 1997Llap. bilateral hernias 1998  . Coronary stent placement  11/2007    8/09  Left Circumflex Stent by Dr Toy Care started and aggrenox stopped  . Cataract extraction  06/09    left  . Laparoscopic cholecystectomy  07/11    Dr.Rosenbower  . Squamous cell carcinoma excision  3/13    back  . Aortic valve replacement  2001    Porcine valve  . Pacemaker insertion  01/21/2012    Medtronic Adapta L implanted for complete heart block  . Nm myocar perf wall motion  11/22/2007    mild septal ischemia,EF 66%  . Permanent pacemaker insertion N/A 01/21/2012    Procedure: PERMANENT PACEMAKER INSERTION;  Surgeon: Thompson Grayer, MD;  Location: [facility] CATH LAB;  Service: Cardiovascular;  Laterality: N/A;  . Cardiac catheterization  06/20/2014    Procedure: CORONARY/GRAFT ANGIOGRAPHY;  Surgeon: Lorretta Harp, MD;  Location: [facility] CATH LAB;  Service: Cardiovascular;;  . Eye surgery  06/18/15    Current Medications: Outpatient Prescriptions Prior to Visit  Medication Sig Dispense Refill  . aspirin 81 MG tablet Take 81 mg by mouth daily.    . bisoprolol-hydrochlorothiazide (ZIAC) 5-6.25 MG per tablet Take 1 tablet by mouth daily. 90 tablet 3  . carbidopa-levodopa (SINEMET CR) 50-200 MG tablet Take 1 tablet by mouth 6 (six) times daily. 540 tablet 1  . carbidopa-levodopa (SINEMET IR) 25-100 MG per tablet Take 2 tablets by mouth 5 (five) times daily. 900 tablet 1  . lansoprazole (PREVACID) 15 MG capsule Take 15 mg by mouth at bedtime. gerd    . polyethylene glycol (MIRALAX / GLYCOLAX) packet Take 17 g by mouth daily.    . potassium chloride (K-DUR,KLOR-CON) 10 MEQ tablet Take 10 mEq by mouth 2 (two) times daily.    . vitamin B-12  (CYANOCOBALAMIN) 1000 MCG tablet Take 1,000 mcg by mouth daily.    . furosemide (LASIX) 40 MG tablet Take 1 tablet (40 mg total) by mouth daily. 90 tablet 3   Facility-Administered Medications Prior to Visit  Medication Dose Route Frequency Provider Last Rate Last Dose  . DOBUTamine (DOBUTREX) 1,000 mcg/mL in dextrose 5% 250 mL infusion  5-20 mcg/kg/min (Order-Specific) Intravenous Continuous Carlena Bjornstad, MD 40.8 mL/hr at 09/20/14 1415 10 mcg/kg/min at 09/20/14 1415     Allergies:   Plavix; Pravastatin; Zocor; and Penicillins   Social History   Social History  . Marital Status: Married    Spouse Name: N/A  . Number of Children: 3  . Years of Education: N/A   Occupational History  . retired- [place] [employer]   . part-time sub/courier for schools-  now only rarely    Social History Main Topics  . Smoking status: Former Smoker -- 1.00 packs/day for 5 years    Types: Cigarettes    Quit date: 06/06/1963  . Smokeless tobacco: Never Used  . Alcohol Use: No     Comment: very rare  . Drug Use: No  . Sexual Activity: No   Other Topics Concern  . None   Social History Narrative   Has living will   Wife, then son [name], hold health care POA   Requests DNR --done   Requests no tube feeds if cognitively unaware     Family History:  The patient's family history includes Asthma in his father; Heart disease in his brother.   ROS:   Please see the history of present illness.    ROS All other systems reviewed and are negative.   PHYSICAL EXAM:   VS:  BP 139/75 mmHg  Pulse 72  Ht 5\' 6"  (1.676 m)  Wt 67.495 kg (148 lb 12.8 oz)  BMI 24.03 kg/m2  SpO2 98%   GEN: Well nourished, well developed, in no acute distress HEENT: normal Neck: no JVD, carotid bruits, or masses Cardiac: Paradoxically split S2 , RRR; early peaking systolic ejection murmur in the aortic focus, no diastolic murmurs, rubs, or gallops,no edema  Respiratory:  clear to auscultation bilaterally, normal work  of breathing GI: soft, nontender, nondistended, + BS MS: no deformity or atrophy Skin: warm and dry, no rash Neuro:  Prominent resting tremor ; Alert and Oriented x 3, Strength and sensation are intact Psych: euthymic mood, full affect  Wt Readings from Last 3 Encounters:  09/09/15 67.495 kg (148 lb 12.8 oz)  08/19/15 68.493 kg (151 lb)  07/10/15 68.947 kg (152 lb)      Studies/Labs Reviewed:   EKG:  EKG is not ordered today.    Recent Labs: No results found for requested labs within last 365 days.   Lipid Panel    Component Value Date/Time   CHOL 163 03/29/2012 1138   TRIG 75.0 03/29/2012 1138   HDL 54.80 03/29/2012 1138   CHOLHDL 3 03/29/2012 1138   VLDL 15.0 03/29/2012 1138   LDLCALC 93 03/29/2012 1138   LDLDIRECT 98.3 07/21/2006 1521    Additional studies/ records that were reviewed today include:  Notes from Ignacia Bayley, NP    ASSESSMENT:    1. Chronic combined systolic (congestive) and diastolic (congestive) heart failure ([place])   2. Complete heart block ([place])   3. S/P cardiac pacemaker procedure, Medtronic Adapta L  ADDRL1, 01/21/12   4. Coronary artery disease involving native coronary artery of native heart without angina pectoris   5. Aortic valve stenosis, acquired -- s/p AVR with [place] Bioprosthetic AVR; By Echo in 05/2009 moderate stenosis,AVA 0.98cm.   6. Essential hypertension, benign   7. Medication management      PLAN:  In order of problems listed above:  1. CHF: His cardiomyopathy remains unexplained. The valve problem appears only moderate and is not progressive. There is no change in coronary anatomy to explain the drop in LVEF. It is quite possible that increased burden of ventricular pacing has led to worsening cardiomyopathy. I think we should think about cardiac resynchronization pacing one more time. He has a follow-up appointment with electrophysiology scheduled next month. He has possible orthopnea and asked him to increase his furosemide  and potassium doses. Repeat labs in a couple weeks. 2. CHB: Pacemaker dependent 3. PPM: Normal device function, consider upgrade to  CRT-P, I agree that he is probably not an appropriate defibrillator candidate. 4. CAD: Asymptomatic. All coronary conduits widely patent by fairly recent angiography. 5. AS: Poor response to intravenous dobutamine leave some degree of uncertainty as to the actual severity of his aortic valve stenosis. I don't think it's severe enough to cause his cardiomyopathy. 6. HTN: Fair control    Medication Adjustments/Labs and Tests Ordered: Current medicines are reviewed at length with the patient today.  Concerns regarding medicines are outlined above.  Medication changes, Labs and Tests ordered today are listed in the Patient Instructions below. Patient Instructions  Medication Instructions: Dr Sallyanne Kuster has recommended making the following medication changes: 1. INCREASE Furosemide (Lasix) to 80 mg daily 2. INCREASE Potassium to 20 mEq twice daily - take 2 tablets (20 mEq total) by mouth twice daily  Labwork: Your physician recommends that you return for lab work in 2 weeks.  Testing/Procedures: 1. Remote Pacemaker Download - Remote monitoring is used to monitor your Pacemaker of ICD from home. This monitoring reduces the number of office visits required to check your device to one time per year. It allows [place] to keep an eye on the functioning of your device to ensure it is working properly. You are scheduled for a device check from home on Thursday, August 24th, 2017. You may send your transmission at any time that day. If you have a wireless device, the transmission will be sent automatically. After your physician reviews your transmission, you will receive a postcard with your next transmission date.  Follow-up: Dr Sallyanne Kuster recommends that you schedule a follow-up appointment in 6 months with a pacemaker check. You will receive a reminder letter in the mail two  months in advance. If you don't receive a letter, please call our office to schedule the follow-up appointment.  If you need a refill on your cardiac medications before your next appointment, please call your pharmacy.    Signed, Sanda Klein, MD  09/10/2015 1:25 PM    [facility] [place], [place], [place]  [address] Phone: [phone]; Fax: [phone]

## 2015-09-09 NOTE — Patient Instructions (Signed)
Medication Instructions: Dr Sallyanne Kuster has recommended making the following medication changes: 1. INCREASE Furosemide (Lasix) to 80 mg daily 2. INCREASE Potassium to 20 mEq twice daily - take 2 tablets (20 mEq total) by mouth twice daily  Labwork: Your physician recommends that you return for lab work in 2 weeks.  Testing/Procedures: 1. Remote Pacemaker Download - Remote monitoring is used to monitor your Pacemaker of ICD from home. This monitoring reduces the number of office visits required to check your device to one time per year. It allows [place] to keep an eye on the functioning of your device to ensure it is working properly. You are scheduled for a device check from home on Thursday, August 24th, 2017. You may send your transmission at any time that day. If you have a wireless device, the transmission will be sent automatically. After your physician reviews your transmission, you will receive a postcard with your next transmission date.  Follow-up: Dr Sallyanne Kuster recommends that you schedule a follow-up appointment in 6 months with a pacemaker check. You will receive a reminder letter in the mail two months in advance. If you don't receive a letter, please call our office to schedule the follow-up appointment.  If you need a refill on your cardiac medications before your next appointment, please call your pharmacy.

## 2015-09-12 ENCOUNTER — Telehealth: Payer: Self-pay | Admitting: Neurology

## 2015-09-12 MED ORDER — CARBIDOPA-LEVODOPA 25-100 MG PO TABS: 2.0000 | ORAL_TABLET | Freq: Every day | ORAL | Status: DC

## 2015-09-12 NOTE — Telephone Encounter (Signed)
Rx sent to pharmacy   

## 2015-09-12 NOTE — Telephone Encounter (Signed)
Pt needs to have his carbidopa leveodopa 25-100 sent to [facility] mail [facility] phone number is [phone]

## 2015-09-19 ENCOUNTER — Encounter: Payer: Self-pay | Admitting: Cardiovascular Disease

## 2015-09-23 DIAGNOSIS — Z79899 Other long term (current) drug therapy: Secondary | ICD-10-CM | POA: Diagnosis not present

## 2015-09-24 DIAGNOSIS — H353131 Nonexudative age-related macular degeneration, bilateral, early dry stage: Secondary | ICD-10-CM | POA: Diagnosis not present

## 2015-09-24 DIAGNOSIS — Z0100 Encounter for examination of eyes and vision without abnormal findings: Secondary | ICD-10-CM | POA: Diagnosis not present

## 2015-09-24 DIAGNOSIS — Z961 Presence of intraocular lens: Secondary | ICD-10-CM | POA: Diagnosis not present

## 2015-09-24 DIAGNOSIS — H04123 Dry eye syndrome of bilateral lacrimal glands: Secondary | ICD-10-CM | POA: Diagnosis not present

## 2015-09-24 LAB — BASIC METABOLIC PANEL
BUN: 14 mg/dL (ref 7–25)
CHLORIDE: 98 mmol/L (ref 98–110)
CO2: 34 mmol/L — ABNORMAL HIGH (ref 20–31)
Calcium: 9.8 mg/dL (ref 8.6–10.3)
Creat: 0.73 mg/dL (ref 0.70–1.11)
GLUCOSE: 95 mg/dL (ref 65–99)
POTASSIUM: 5.2 mmol/L (ref 3.5–5.3)
Sodium: 138 mmol/L (ref 135–146)

## 2015-09-24 LAB — MAGNESIUM: Magnesium: 2.1 mg/dL (ref 1.5–2.5)

## 2015-09-26 ENCOUNTER — Encounter: Payer: Self-pay | Admitting: Cardiovascular Disease

## 2015-10-06 ENCOUNTER — Encounter: Payer: Self-pay | Admitting: Internal Medicine

## 2015-10-06 ENCOUNTER — Ambulatory Visit (INDEPENDENT_AMBULATORY_CARE_PROVIDER_SITE_OTHER): Payer: Medicare Other | Admitting: Internal Medicine

## 2015-10-06 VITALS — BP 128/70 | HR 86 | Ht 67.0 in | Wt 151.0 lb

## 2015-10-06 DIAGNOSIS — I442 Atrioventricular block, complete: Secondary | ICD-10-CM | POA: Diagnosis not present

## 2015-10-06 DIAGNOSIS — I5022 Chronic systolic (congestive) heart failure: Secondary | ICD-10-CM

## 2015-10-06 DIAGNOSIS — I701 Atherosclerosis of renal artery: Secondary | ICD-10-CM | POA: Diagnosis not present

## 2015-10-06 NOTE — Progress Notes (Signed)
Electrophysiology Office Note   Date:  10/06/2015   ID:  [name], DOB [birth year], MRN [id number]  PCP:  Viviana Simpler, MD  Cardiologist:  Dr Gwenlyn Found Primary Electrophysiologist: Thompson Grayer, MD    Chief Complaint  Patient presents with  . Chronic dysfunction of LV     History of Present Illness: Ian [name] is a 82 y.o. male who presents today for electrophysiology evaluation.   He presents for further EP discussions about possible device upgrade. He continues to have tremors of parkinsons.  He has some fatigue and SOB but really is able to do most of what he desires.  His wife feels that he has declined.  He also has moderate to severe aortic prosthesis stenosis.  Today, he denies symptoms of palpitations, chest pain,  orthopnea, PND, lower extremity edema, claudication, dizziness, presyncope, syncope, or bleeding. The patient is tolerating medications without difficulties and is otherwise without complaint today.    Past Medical History  Diagnosis Date  . CAD (coronary artery disease)     a. s/p CABG with LIMA-LAD in 2001 with AVR. b. BMS to LCx 8/08. c. Abnl nuc/dec EF 05/2014 - s/p cath with patent LIMA to LAD, patent dominant right, mild LCx disease, patent stent. Med rx.  . Allergy   . Diverticulitis   . GERD (gastroesophageal reflux disease)   . Essential hypertension   . Osteoarthritis   . Osteoporosis   . MALT lymphoma ([place])   . Parkinson's disease   . TIA (transient ischemic attack) 06/08    a. 09/2006  . Hyperlipidemia   . ED (erectile dysfunction)   . Aortic valve stenosis, acquired     a. s/p Porcine AVR 2001;  b. 09/2014 Dobut Echo: mod-sev bioprosthetic AS w/ minimal change in CO and only mild increase in gradients w/ dobutamine.   . Renal artery stenosis (HCC)     a. 90% RRA stenosis previously. b. Duplex 09/2014: R renal artery 60-99%, L renal artery 1-59%.  . S/P cardiac pacemaker procedure, Medtronic Adapta L  ADDRL1, 01/21/12     a. 01/2012 s/p  MDT [place] PPM (ser # [id number]).  . Symptomatic bradycardia     a. 01/2012 s/p MDT [place] PPM (ser # [id number]).  . Chronic combined systolic (congestive) and diastolic (congestive) heart failure (HCC)     a. EF 50-55% in 02/2013;  b. 05/2014 Echo: EF 20-25%, Gr1 DD.  Marland Kitchen Nonischemic cardiomyopathy ([place])     a. 05/2014 Echo: EF 20-25%-->f/u cath w/ nonobs dzs.   Past Surgical History  Procedure Laterality Date  . Coronary artery bypass graft  2001    Lucianne Lei trigt ) aortic valve replacement 2001; LIMA-LAD.  Marland Kitchen Esophagogastroduodenoscopy      multiple, Colon/ EGD benign 11/2004  . Pilonidal cyst / sinus excision  1953  . Inguinal hernia repair      LIH 1997Llap. bilateral hernias 1998  . Coronary stent placement  11/2007    8/09  Left Circumflex Stent by Dr Toy Care started and aggrenox stopped  . Cataract extraction  06/09    left  . Laparoscopic cholecystectomy  07/11    Dr.Rosenbower  . Squamous cell carcinoma excision  3/13    back  . Aortic valve replacement  2001    Porcine valve  . Pacemaker insertion  01/21/2012    Medtronic Adapta L implanted for complete heart block  . Nm myocar perf wall motion  11/22/2007    mild septal ischemia,EF  66%  . Permanent pacemaker insertion N/A 01/21/2012    Procedure: PERMANENT PACEMAKER INSERTION;  Surgeon: Thompson Grayer, MD;  Location[facility] CATH LAB;  Service: Cardiovascular;  Laterality: N/A;  . Cardiac catheterization  06/20/2014    Procedure: CORONARY/GRAFT ANGIOGRAPHY;  Surgeon: Lorretta Harp, MD;  Location: [facility] CATH LAB;  Service: Cardiovascular;;  . Eye surgery  06/18/15     Current Outpatient Prescriptions  Medication Sig Dispense Refill  . aspirin 81 MG tablet Take 81 mg by mouth daily.    . bisoprolol-hydrochlorothiazide (ZIAC) 5-6.25 MG per tablet Take 1 tablet by mouth daily. 90 tablet 3  . carbidopa-levodopa (SINEMET CR) 50-200 MG tablet Take 1 tablet by mouth 6 (six) times daily. 540 tablet 1  .  carbidopa-levodopa (SINEMET IR) 25-100 MG tablet Take 2 tablets by mouth 5 (five) times daily. 900 tablet 1  . furosemide (LASIX) 80 MG tablet Take 1 tablet (80 mg total) by mouth daily. 90 tablet 3  . lansoprazole (PREVACID) 15 MG capsule Take 15 mg by mouth at bedtime. gerd    . polyethylene glycol (MIRALAX / GLYCOLAX) packet Take 17 g by mouth daily.    . potassium chloride (K-DUR,KLOR-CON) 10 MEQ tablet Take 10 mEq by mouth 2 (two) times daily.    . vitamin B-12 (CYANOCOBALAMIN) 1000 MCG tablet Take 1,000 mcg by mouth daily.     No current facility-administered medications for this visit.   Facility-Administered Medications Ordered in Other Visits  Medication Dose Route Frequency Provider Last Rate Last Dose  . DOBUTamine (DOBUTREX) 1,000 mcg/mL in dextrose 5% 250 mL infusion  5-20 mcg/kg/min (Order-Specific) Intravenous Continuous Carlena Bjornstad, MD 40.8 mL/hr at 09/20/14 1415 10 mcg/kg/min at 09/20/14 1415    Allergies:   Plavix; Pravastatin; Zocor; and Penicillins   Social History:  The patient  reports that he quit smoking about 52 years ago. His smoking use included Cigarettes. He has a 5 pack-year smoking history. He has never used smokeless tobacco. He reports that he does not drink alcohol or use illicit drugs.   Family History:  The patient's family history includes Asthma in his father; Heart disease in his brother.    ROS:  Please see the history of present illness.   All other systems are reviewed and negative.    PHYSICAL EXAM: VS:  BP 128/70 mmHg  Pulse 86  Ht 5\' 7"  (1.702 m)  Wt 151 lb (68.493 kg)  BMI 23.64 kg/m2  SpO2 94% , BMI Body mass index is 23.64 kg/(m^2). GEN: Well nourished, well developed, in no acute distress HEENT: normal Neck: no JVD, carotid bruits, or masses Cardiac: RRR (Paced) Respiratory:  clear to auscultation bilaterally, normal work of breathing GI: soft, nontender, nondistended, + BS MS: no deformity or atrophy Skin: warm and dry, device  pocket is well healed Neuro:  Very prominent parkinsonian tremor Psych: euthymic mood, full affect   Device interrogation is reviewed today in detail.  See PaceArt for details.   Recent Labs: 09/23/2015: BUN 14; Creat 0.73; Magnesium 2.1; Potassium 5.2; Sodium 138    Lipid Panel     Component Value Date/Time   CHOL 163 03/29/2012 1138   TRIG 75.0 03/29/2012 1138   HDL 54.80 03/29/2012 1138   CHOLHDL 3 03/29/2012 1138   VLDL 15.0 03/29/2012 1138   LDLCALC 93 03/29/2012 1138   LDLDIRECT 98.3 07/21/2006 1521     Wt Readings from Last 3 Encounters:  10/06/15 151 lb (68.493 kg)  09/09/15 148 lb 12.8 oz (67.495  kg)  08/19/15 151 lb (68.493 kg)      Other studies Reviewed: Additional studies/ records that were reviewed today include:  Ignacia Bayley and Dr Lurline Del notes reviewed, echo reviewed   ASSESSMENT AND PLAN:  1.  Chronic systolic dysfunction/ complete heart block Stable NYHA Class II symptoms.  These are likely multifactoral and related to parkinsons, reduced EF, and aortic valve stenosis Today, we again discussed upgrade of his pacemaker to a CRT device.  Given his advanced age, he is not a candidate for an ICD.  He has complete heart block and V paces 100% Risks, benefits, alternatives to pacemaker upgrade to CRT -P were discussed in detail with the patient today. The patient understands that the risks include but are not limited to bleeding, infection, pneumothorax, perforation, tamponade, vascular damage, renal failure, MI, stroke, death,  and lead dislodgement and wishes to proceed. We will therefore schedule the procedure at the next available time.  Today, I have spent 40  minutes with the patient discussing his CHF and possible device upgrade.  More than 50% of the visit time today was spent on this issue.    SignedThompson Grayer, MD  10/06/2015 5:02 PM     [address] [place] [address] [phone]  (office) [phone] (fax)

## 2015-10-06 NOTE — Patient Instructions (Signed)
Medication Instructions:  Your physician recommends that you continue on your current medications as directed. Please refer to the Current Medication list given to you today.   Labwork: Your physician recommends that you return for lab work on 10/23/15 at 2pm   Testing/Procedures: Bi V pacemaker upgrade on 10/30/15 at 7:30am  Please arrive at [facility] at 5:30am on 10/30/15 Do not eat or drink after midnight the night prior to your procedure Do not take any medications the morning of your procedure  Follow-Up: Your physician recommends that you schedule a follow-up appointment in: 10-14 days from 10/30/15 in the device clinic for wound check and 3 months from 10/30/15 with Dr Rayann Heman   Any Other Special Instructions Will Be Listed Below (If Applicable).     If you need a refill on your cardiac medications before your next appointment, please call your pharmacy.

## 2015-10-16 ENCOUNTER — Telehealth: Payer: Self-pay | Admitting: Neurology

## 2015-10-16 NOTE — Telephone Encounter (Signed)
Ian [name] called his wife and would like to talk to someone about medication [phone]

## 2015-10-17 NOTE — Telephone Encounter (Signed)
We are really stuck because he cannot be on nuplazid b/c of prolonged QT interval.

## 2015-10-17 NOTE — Telephone Encounter (Signed)
LMOM making patient aware and informed him to call back with any questions.

## 2015-10-17 NOTE — Telephone Encounter (Signed)
Spoke with patient. According to last office note in March he was supposed to be taking Carbidopa Levodopa 50/200 CR - 1 tablet 6 times daily.   He called back in April and we stated he could add Carbidopa Levodopa 25/100 IR- no more than 3 tablets daily to his Carbidopa Levodopa 50/200 CR dosage.   He states he tried this for awhile but it didn't work. He is now back to taking Carbidopa Levodopa 50/200 CR - 1 tablet 6 times daily and Carbidopa Levodopa 25/100 - 1 tablet five times daily.   He states hallucinations have increase dramatically. I made him aware that he should not be taking more than 3 Carbidopa Levodopa IR tablets daily but he states the above dosage is the only dosage that has controlled his symptoms.   Please advise next steps.   He states when I call back I can leave detailed message if he is unreachable.

## 2015-10-23 ENCOUNTER — Other Ambulatory Visit (INDEPENDENT_AMBULATORY_CARE_PROVIDER_SITE_OTHER): Payer: Medicare Other

## 2015-10-23 DIAGNOSIS — I442 Atrioventricular block, complete: Secondary | ICD-10-CM | POA: Diagnosis not present

## 2015-10-23 LAB — BASIC METABOLIC PANEL
BUN: 17 mg/dL (ref 7–25)
CALCIUM: 9.1 mg/dL (ref 8.6–10.3)
CO2: 33 mmol/L — ABNORMAL HIGH (ref 20–31)
Chloride: 97 mmol/L — ABNORMAL LOW (ref 98–110)
Creat: 0.89 mg/dL (ref 0.70–1.11)
Glucose, Bld: 110 mg/dL — ABNORMAL HIGH (ref 65–99)
POTASSIUM: 4.3 mmol/L (ref 3.5–5.3)
SODIUM: 139 mmol/L (ref 135–146)

## 2015-10-23 LAB — CBC WITH DIFFERENTIAL/PLATELET
BASOS PCT: 1 %
Basophils Absolute: 82 cells/uL (ref 0–200)
EOS PCT: 5 %
Eosinophils Absolute: 410 cells/uL (ref 15–500)
HEMATOCRIT: 38.7 % (ref 38.5–50.0)
Hemoglobin: 13.2 g/dL (ref 13.2–17.1)
LYMPHS PCT: 25 %
Lymphs Abs: 2050 cells/uL (ref 850–3900)
MCH: 31.1 pg (ref 27.0–33.0)
MCHC: 34.1 g/dL (ref 32.0–36.0)
MCV: 91.3 fL (ref 80.0–100.0)
MONO ABS: 574 {cells}/uL (ref 200–950)
MPV: 9.4 fL (ref 7.5–12.5)
Monocytes Relative: 7 %
Neutro Abs: 5084 cells/uL (ref 1500–7800)
Neutrophils Relative %: 62 %
Platelets: 321 10*3/uL (ref 140–400)
RBC: 4.24 MIL/uL (ref 4.20–5.80)
RDW: 14 % (ref 11.0–15.0)
WBC: 8.2 10*3/uL (ref 3.8–10.8)

## 2015-10-24 ENCOUNTER — Ambulatory Visit (INDEPENDENT_AMBULATORY_CARE_PROVIDER_SITE_OTHER): Payer: Medicare Other | Admitting: Internal Medicine

## 2015-10-24 ENCOUNTER — Encounter: Payer: Self-pay | Admitting: Internal Medicine

## 2015-10-24 VITALS — BP 112/70 | HR 65 | Temp 97.5°F | Ht 65.5 in | Wt 149.0 lb

## 2015-10-24 DIAGNOSIS — Z0000 Encounter for general adult medical examination without abnormal findings: Secondary | ICD-10-CM

## 2015-10-24 DIAGNOSIS — I5022 Chronic systolic (congestive) heart failure: Secondary | ICD-10-CM | POA: Diagnosis not present

## 2015-10-24 DIAGNOSIS — I700 Atherosclerosis of aorta: Secondary | ICD-10-CM | POA: Diagnosis not present

## 2015-10-24 DIAGNOSIS — I701 Atherosclerosis of renal artery: Secondary | ICD-10-CM | POA: Diagnosis not present

## 2015-10-24 DIAGNOSIS — G20 Parkinson's disease: Secondary | ICD-10-CM

## 2015-10-24 DIAGNOSIS — I442 Atrioventricular block, complete: Secondary | ICD-10-CM

## 2015-10-24 DIAGNOSIS — Z23 Encounter for immunization: Secondary | ICD-10-CM

## 2015-10-24 DIAGNOSIS — IMO0001 Reserved for inherently not codable concepts without codable children: Secondary | ICD-10-CM

## 2015-10-24 DIAGNOSIS — Z7189 Other specified counseling: Secondary | ICD-10-CM

## 2015-10-24 DIAGNOSIS — G20A1 Parkinson's disease without dyskinesia, without mention of fluctuations: Secondary | ICD-10-CM

## 2015-10-24 NOTE — Progress Notes (Signed)
Subjective:    Patient ID: Ian [name], male    DOB: 10/07/1928, 82 y.o.   MRN: [id number]  HPI Here for Medicare wellness and follow up of chronic health concerns With wife Reviewed form and advanced directives Reviewed other doctors No alcohol or tobacco Tries to exercise at least 3 days per week Vision is not good-- has been to 3 eye doctors recently. Surgery on eyelid but it didn't help the vision at all (may be better for eye moisture). Ongoing hearing problems Does ADLs but limited instrumental ADLs No falls---has been able to catch himself better Occasional depressed mood--nothing persistent. Not anhedonic but limited activities  Having some fullness in right ear Hearing is off there Did have some brief pain in past day--- not sure which ear No apparent fever and not sick  Continues with Dr Tat Parkinson's is fairly stable Did give up driving about a year ago Still independent with all ADLs. Helps with housework--dries dishes, vacuums at times, folds clothes, etc He feels his memory is "terrible". Wife also notes mild progression. Mixes up the days at times also (relates to retirement)  Sees Dr Gwenlyn Found also Having procedure by Dr Rayann Heman next week--new pacer lead Chronic DOE--seems to be worsening Doesn't get a sense of getting a good breath in at times No chest pain Aware of irregular heartbeat--but does have pacemaker  No longer sees Dr Watt Climes No signs of upper GI problems Prevacid seems to control his heartburn.  No trouble swallowing   Current Outpatient Prescriptions on File Prior to Visit  Medication Sig Dispense Refill  . aspirin 81 MG tablet Take 81 mg by mouth daily.    . bisoprolol-hydrochlorothiazide (ZIAC) 5-6.25 MG per tablet Take 1 tablet by mouth daily. 90 tablet 3  . carbidopa-levodopa (SINEMET CR) 50-200 MG tablet Take 1 tablet by mouth 6 (six) times daily. 540 tablet 1  . carbidopa-levodopa (SINEMET IR) 25-100 MG tablet Take 2 tablets by mouth  5 (five) times daily. 900 tablet 1  . furosemide (LASIX) 80 MG tablet Take 1 tablet (80 mg total) by mouth daily. 90 tablet 3  . lansoprazole (PREVACID) 15 MG capsule Take 15 mg by mouth daily as needed (stomach). gerd    . Polyethyl Glycol-Propyl Glycol (SYSTANE ULTRA OP) Place 1 drop into both eyes daily.    . polyethylene glycol (MIRALAX / GLYCOLAX) packet Take 17 g by mouth daily.    . potassium chloride (K-DUR,KLOR-CON) 10 MEQ tablet Take 10 mEq by mouth daily.     . vitamin B-12 (CYANOCOBALAMIN) 1000 MCG tablet Take 1,000 mcg by mouth daily.     Current Facility-Administered Medications on File Prior to Visit  Medication Dose Route Frequency Provider Last Rate Last Dose  . DOBUTamine (DOBUTREX) 1,000 mcg/mL in dextrose 5% 250 mL infusion  5-20 mcg/kg/min (Order-Specific) Intravenous Continuous Carlena Bjornstad, MD 40.8 mL/hr at 09/20/14 1415 10 mcg/kg/min at 09/20/14 1415    Allergies  Allergen Reactions  . Plavix [Clopidogrel]     Fatigue   . Pravastatin     Muscle aches  . Zocor [Simvastatin]     REACTION: dizziness  . Penicillins Rash    Has patient had a PCN reaction causing immediate rash, facial/tongue/throat swelling, SOB or lightheadedness with hypotension: YES Has patient had a PCN reaction causing severe rash involving mucus membranes or skin necrosis: NO Has patient had a PCN reaction that required hospitalization NO Has patient had a PCN reaction occurring within the last 10 years: NO If  all of the above answers are "NO", then may proceed with Cephalosporin use.    Past Medical History  Diagnosis Date  . CAD (coronary artery disease)     a. s/p CABG with LIMA-LAD in 2001 with AVR. b. BMS to LCx 8/08. c. Abnl nuc/dec EF 05/2014 - s/p cath with patent LIMA to LAD, patent dominant right, mild LCx disease, patent stent. Med rx.  . Allergy   . Diverticulitis   . GERD (gastroesophageal reflux disease)   . Essential hypertension   . Osteoarthritis   . Osteoporosis   .  MALT lymphoma ([place])   . Parkinson's disease   . TIA (transient ischemic attack) 06/08    a. 09/2006  . Hyperlipidemia   . ED (erectile dysfunction)   . Aortic valve stenosis, acquired     a. s/p Porcine AVR 2001;  b. 09/2014 Dobut Echo: mod-sev bioprosthetic AS w/ minimal change in CO and only mild increase in gradients w/ dobutamine.   . Renal artery stenosis (HCC)     a. 90% RRA stenosis previously. b. Duplex 09/2014: R renal artery 60-99%, L renal artery 1-59%.  . S/P cardiac pacemaker procedure, Medtronic Adapta L  ADDRL1, 01/21/12     a. 01/2012 s/p MDT [place] PPM (ser # [id number]).  . Symptomatic bradycardia     a. 01/2012 s/p MDT [place] PPM (ser # [id number]).  . Chronic combined systolic (congestive) and diastolic (congestive) heart failure (HCC)     a. EF 50-55% in 02/2013;  b. 05/2014 Echo: EF 20-25%, Gr1 DD.  Marland Kitchen Nonischemic cardiomyopathy ([place])     a. 05/2014 Echo: EF 20-25%-->f/u cath w/ nonobs dzs.    Past Surgical History  Procedure Laterality Date  . Coronary artery bypass graft  2001    Lucianne Lei trigt ) aortic valve replacement 2001; LIMA-LAD.  Marland Kitchen Esophagogastroduodenoscopy      multiple, Colon/ EGD benign 11/2004  . Pilonidal cyst / sinus excision  1953  . Inguinal hernia repair      LIH 1997Llap. bilateral hernias 1998  . Coronary stent placement  11/2007    8/09  Left Circumflex Stent by Dr Toy Care started and aggrenox stopped  . Cataract extraction  06/09    left  . Laparoscopic cholecystectomy  07/11    Dr.Rosenbower  . Squamous cell carcinoma excision  3/13    back  . Aortic valve replacement  2001    Porcine valve  . Pacemaker insertion  01/21/2012    Medtronic Adapta L implanted for complete heart block  . Nm myocar perf wall motion  11/22/2007    mild septal ischemia,EF 66%  . Permanent pacemaker insertion N/A 01/21/2012    Procedure: PERMANENT PACEMAKER INSERTION;  Surgeon: Thompson Grayer, MD;  Location: [facility] CATH LAB;  Service:  Cardiovascular;  Laterality: N/A;  . Cardiac catheterization  06/20/2014    Procedure: CORONARY/GRAFT ANGIOGRAPHY;  Surgeon: Lorretta Harp, MD;  Location: [facility] CATH LAB;  Service: Cardiovascular;;  . Eye surgery  06/18/15    Family History  Problem Relation Age of Onset  . Heart disease Brother   . Asthma Father     Social History   Social History  . Marital Status: Married    Spouse Name: N/A  . Number of Children: 3  . Years of Education: N/A   Occupational History  . retired- [place] [employer]   . part-time sub/courier for schools- now only rarely    Social History Main Topics  .  Smoking status: Former Smoker -- 1.00 packs/day for 5 years    Types: Cigarettes    Quit date: 06/06/1963  . Smokeless tobacco: Never Used  . Alcohol Use: No     Comment: very rare  . Drug Use: No  . Sexual Activity: No   Other Topics Concern  . Not on file   Social History Narrative   Has living will   Wife, then son [name], hold health care POA   Requests DNR --done   Requests no tube feeds if cognitively unaware   Review of Systems Appetite is generally okay Weight stable Not a good sleeper --gets up 1-2 times for nocturia (some trouble getting back to sleep) Full dentures--does see Dr Edsel Petrin for adjustments No sig back or joint pains Bowels are okay with miralax and Dr Doristine Devoid apple sauce/bran/prune juice combo Urine flow is fair--- does slow at the end. Does feel able to empty Chronic dry skin spots--uses moisturizers. Keratoses on vertex--moisturizers help a little    Objective:   Physical Exam  Constitutional: He appears well-developed and well-nourished. No distress.  HENT:  Mouth/Throat: Oropharynx is clear and moist. No oropharyngeal exudate.  dentures  Neck: Normal range of motion. Neck supple. No thyromegaly present.  Cardiovascular: Normal rate and regular rhythm.  Exam reveals no gallop.   Prominent coarse aortic systolic murmur Faint pedal pulses    Pulmonary/Chest: Effort normal and breath sounds normal. No respiratory distress. He has no wheezes. He has no rales.  Abdominal: Soft. There is no tenderness.  Musculoskeletal: He exhibits no edema or tenderness.  Lymphadenopathy:    He has no cervical adenopathy.  Neurological: He is alert.  Forgot the year--but knows month President-- "Daisy Floro, the black man Santiago Bumpers), ?" [id number] D-l-o-w Recall 2/3  Skin: No rash noted. No erythema.  Psychiatric: He has a normal mood and affect. His behavior is normal.          Assessment & Plan:

## 2015-10-24 NOTE — Assessment & Plan Note (Signed)
And sig stenosis of bioprosthetic valve No action on this for now

## 2015-10-24 NOTE — Assessment & Plan Note (Signed)
Has DNR 

## 2015-10-24 NOTE — Assessment & Plan Note (Signed)
Seen on CT scan Cholesterol has been fine On ASA

## 2015-10-24 NOTE — Addendum Note (Signed)
Addended by: Pilar Grammes on: 10/24/2015 12:48 PM   Modules accepted: Orders

## 2015-10-24 NOTE — Assessment & Plan Note (Signed)
Recent increase in furosemide Stable but troubling DOE

## 2015-10-24 NOTE — Assessment & Plan Note (Signed)
I have personally reviewed the Medicare Annual Wellness questionnaire and have noted 1. The patient's medical and social history 2. Their use of alcohol, tobacco or illicit drugs 3. Their current medications and supplements 4. The patient's functional ability including ADL's, fall risks, home safety risks and hearing or visual             impairment. 5. Diet and physical activities 6. Evidence for depression or mood disorders  The patients weight, height, BMI and visual acuity have been recorded in the chart I have made referrals, counseling and provided education to the patient based review of the above and I have provided the pt with a written personalized care plan for preventive services.  I have provided you with a copy of your personalized plan for preventive services. Please take the time to review along with your updated medication list.  No cancer screening due to age Yearly flu shots Booster for pneumovax

## 2015-10-24 NOTE — Assessment & Plan Note (Signed)
Getting revision of pacemaker by Dr Rayann Heman

## 2015-10-24 NOTE — Progress Notes (Signed)
Pre visit review using our clinic review tool, if applicable. No additional management support is needed unless otherwise documented below in the visit note. 

## 2015-10-24 NOTE — Assessment & Plan Note (Signed)
No sig functional decline Mild dementia --not progressing quickly

## 2015-10-30 ENCOUNTER — Ambulatory Visit (HOSPITAL_COMMUNITY)
Admission: RE | Admit: 2015-10-30 | Discharge: 2015-10-30 | Disposition: A | Discharge status: Home / Self Care | Payer: Medicare Other | Source: Ambulatory Visit | Attending: Internal Medicine | Admitting: Internal Medicine

## 2015-10-30 ENCOUNTER — Ambulatory Visit (HOSPITAL_COMMUNITY): Payer: Medicare Other

## 2015-10-30 ENCOUNTER — Encounter (HOSPITAL_COMMUNITY): Payer: Self-pay | Admitting: Internal Medicine

## 2015-10-30 ENCOUNTER — Encounter (HOSPITAL_COMMUNITY)
Admission: RE | Disposition: A | Discharge status: Home / Self Care | Payer: Self-pay | Source: Ambulatory Visit | Attending: Internal Medicine

## 2015-10-30 DIAGNOSIS — K219 Gastro-esophageal reflux disease without esophagitis: Secondary | ICD-10-CM | POA: Insufficient documentation

## 2015-10-30 DIAGNOSIS — M810 Age-related osteoporosis without current pathological fracture: Secondary | ICD-10-CM | POA: Insufficient documentation

## 2015-10-30 DIAGNOSIS — I2510 Atherosclerotic heart disease of native coronary artery without angina pectoris: Secondary | ICD-10-CM | POA: Insufficient documentation

## 2015-10-30 DIAGNOSIS — C884 Extranodal marginal zone B-cell lymphoma of mucosa-associated lymphoid tissue [MALT-lymphoma]: Secondary | ICD-10-CM | POA: Insufficient documentation

## 2015-10-30 DIAGNOSIS — Z950 Presence of cardiac pacemaker: Secondary | ICD-10-CM | POA: Diagnosis not present

## 2015-10-30 DIAGNOSIS — Z8249 Family history of ischemic heart disease and other diseases of the circulatory system: Secondary | ICD-10-CM | POA: Insufficient documentation

## 2015-10-30 DIAGNOSIS — G20 Parkinson's disease: Secondary | ICD-10-CM | POA: Insufficient documentation

## 2015-10-30 DIAGNOSIS — Z951 Presence of aortocoronary bypass graft: Secondary | ICD-10-CM | POA: Diagnosis not present

## 2015-10-30 DIAGNOSIS — Z880 Allergy status to penicillin: Secondary | ICD-10-CM | POA: Diagnosis not present

## 2015-10-30 DIAGNOSIS — E785 Hyperlipidemia, unspecified: Secondary | ICD-10-CM | POA: Diagnosis not present

## 2015-10-30 DIAGNOSIS — Z45010 Encounter for checking and testing of cardiac pacemaker pulse generator [battery]: Secondary | ICD-10-CM | POA: Diagnosis not present

## 2015-10-30 DIAGNOSIS — I110 Hypertensive heart disease with heart failure: Secondary | ICD-10-CM | POA: Insufficient documentation

## 2015-10-30 DIAGNOSIS — Z87891 Personal history of nicotine dependence: Secondary | ICD-10-CM | POA: Diagnosis not present

## 2015-10-30 DIAGNOSIS — I428 Other cardiomyopathies: Secondary | ICD-10-CM | POA: Insufficient documentation

## 2015-10-30 DIAGNOSIS — Z955 Presence of coronary angioplasty implant and graft: Secondary | ICD-10-CM | POA: Diagnosis not present

## 2015-10-30 DIAGNOSIS — Z8673 Personal history of transient ischemic attack (TIA), and cerebral infarction without residual deficits: Secondary | ICD-10-CM | POA: Diagnosis not present

## 2015-10-30 DIAGNOSIS — I701 Atherosclerosis of renal artery: Secondary | ICD-10-CM | POA: Diagnosis not present

## 2015-10-30 DIAGNOSIS — N529 Male erectile dysfunction, unspecified: Secondary | ICD-10-CM | POA: Insufficient documentation

## 2015-10-30 DIAGNOSIS — I5042 Chronic combined systolic (congestive) and diastolic (congestive) heart failure: Secondary | ICD-10-CM | POA: Diagnosis not present

## 2015-10-30 DIAGNOSIS — I442 Atrioventricular block, complete: Secondary | ICD-10-CM | POA: Diagnosis not present

## 2015-10-30 DIAGNOSIS — I5022 Chronic systolic (congestive) heart failure: Secondary | ICD-10-CM | POA: Diagnosis not present

## 2015-10-30 DIAGNOSIS — Z7982 Long term (current) use of aspirin: Secondary | ICD-10-CM | POA: Insufficient documentation

## 2015-10-30 DIAGNOSIS — Z952 Presence of prosthetic heart valve: Secondary | ICD-10-CM | POA: Diagnosis not present

## 2015-10-30 DIAGNOSIS — M1990 Unspecified osteoarthritis, unspecified site: Secondary | ICD-10-CM | POA: Insufficient documentation

## 2015-10-30 DIAGNOSIS — Z959 Presence of cardiac and vascular implant and graft, unspecified: Secondary | ICD-10-CM

## 2015-10-30 DIAGNOSIS — Z953 Presence of xenogenic heart valve: Secondary | ICD-10-CM | POA: Insufficient documentation

## 2015-10-30 DIAGNOSIS — I429 Cardiomyopathy, unspecified: Secondary | ICD-10-CM | POA: Diagnosis not present

## 2015-10-30 HISTORY — PX: EP IMPLANTABLE DEVICE: SHX172B

## 2015-10-30 LAB — SURGICAL PCR SCREEN
MRSA, PCR: NEGATIVE
STAPHYLOCOCCUS AUREUS: POSITIVE — AB

## 2015-10-30 SURGERY — BIV PACEMAKER INSERTION CRT-P

## 2015-10-30 MED ORDER — SODIUM CHLORIDE 0.9% FLUSH: 3.0000 mL | INTRAVENOUS | Status: DC | PRN

## 2015-10-30 MED ORDER — VANCOMYCIN HCL IN DEXTROSE 1-5 GM/200ML-% IV SOLN
1000.0000 mg | INTRAVENOUS | Status: AC
  Administered 2015-10-30: 1000 mg via INTRAVENOUS
  Filled 2015-10-30: qty 200

## 2015-10-30 MED ORDER — POTASSIUM CHLORIDE CRYS ER 10 MEQ PO TBCR: 10.0000 meq | EXTENDED_RELEASE_TABLET | Freq: Every day | ORAL | Status: DC

## 2015-10-30 MED ORDER — SODIUM CHLORIDE 0.9 % IV SOLN
INTRAVENOUS | Status: DC
  Administered 2015-10-30: 07:00:00 via INTRAVENOUS

## 2015-10-30 MED ORDER — SODIUM CHLORIDE 0.9 % IR SOLN
80.0000 mg | Status: DC
  Filled 2015-10-30: qty 2

## 2015-10-30 MED ORDER — MIDAZOLAM HCL 5 MG/5ML IJ SOLN
INTRAMUSCULAR | Status: AC
  Filled 2015-10-30: qty 5

## 2015-10-30 MED ORDER — VANCOMYCIN HCL IN DEXTROSE 1-5 GM/200ML-% IV SOLN
INTRAVENOUS | Status: AC
  Filled 2015-10-30: qty 200

## 2015-10-30 MED ORDER — MUPIROCIN 2 % EX OINT
1.0000 "application " | TOPICAL_OINTMENT | Freq: Once | CUTANEOUS | Status: AC
  Administered 2015-10-30: 1 via TOPICAL
  Filled 2015-10-30: qty 22

## 2015-10-30 MED ORDER — HYDROCODONE-ACETAMINOPHEN 5-325 MG PO TABS: 1.0000 | ORAL_TABLET | ORAL | Status: DC | PRN

## 2015-10-30 MED ORDER — MUPIROCIN 2 % EX OINT
TOPICAL_OINTMENT | CUTANEOUS | Status: AC
  Filled 2015-10-30: qty 22

## 2015-10-30 MED ORDER — HEPARIN (PORCINE) IN NACL 2-0.9 UNIT/ML-% IJ SOLN
INTRAMUSCULAR | Status: DC | PRN
  Administered 2015-10-30: 500 mL

## 2015-10-30 MED ORDER — VANCOMYCIN HCL IN DEXTROSE 1-5 GM/200ML-% IV SOLN
1000.0000 mg | Freq: Two times a day (BID) | INTRAVENOUS | Status: DC
  Filled 2015-10-30: qty 200

## 2015-10-30 MED ORDER — CARBIDOPA-LEVODOPA ER 50-200 MG PO TBCR
1.0000 | EXTENDED_RELEASE_TABLET | Freq: Every day | ORAL | Status: DC
  Administered 2015-10-30: 1 via ORAL
  Filled 2015-10-30 (×5): qty 1

## 2015-10-30 MED ORDER — HEPARIN (PORCINE) IN NACL 2-0.9 UNIT/ML-% IJ SOLN
INTRAMUSCULAR | Status: AC
  Filled 2015-10-30: qty 500

## 2015-10-30 MED ORDER — FENTANYL CITRATE (PF) 100 MCG/2ML IJ SOLN
INTRAMUSCULAR | Status: AC
  Filled 2015-10-30: qty 2

## 2015-10-30 MED ORDER — LIDOCAINE HCL (PF) 1 % IJ SOLN
INTRAMUSCULAR | Status: DC | PRN
  Administered 2015-10-30: 30 mL

## 2015-10-30 MED ORDER — BISOPROLOL-HYDROCHLOROTHIAZIDE 5-6.25 MG PO TABS: 1.0000 | ORAL_TABLET | Freq: Every day | ORAL | Status: DC

## 2015-10-30 MED ORDER — IOPAMIDOL (ISOVUE-370) INJECTION 76%
INTRAVENOUS | Status: AC
  Filled 2015-10-30: qty 100

## 2015-10-30 MED ORDER — SODIUM CHLORIDE 0.9% FLUSH: 3.0000 mL | Freq: Two times a day (BID) | INTRAVENOUS | Status: DC

## 2015-10-30 MED ORDER — SODIUM CHLORIDE 0.9 % IR SOLN
Status: AC
  Filled 2015-10-30: qty 2

## 2015-10-30 MED ORDER — SODIUM CHLORIDE 0.9 % IV SOLN: 250.0000 mL | INTRAVENOUS | Status: DC | PRN

## 2015-10-30 MED ORDER — IOPAMIDOL (ISOVUE-370) INJECTION 76%
INTRAVENOUS | Status: DC | PRN
  Administered 2015-10-30: 15 mL via INTRAVENOUS
  Administered 2015-10-30: 27 mL via INTRAVENOUS

## 2015-10-30 MED ORDER — LIDOCAINE HCL (PF) 1 % IJ SOLN
INTRAMUSCULAR | Status: AC
  Filled 2015-10-30: qty 60

## 2015-10-30 MED ORDER — ACETAMINOPHEN 325 MG PO TABS: 325.0000 mg | ORAL_TABLET | ORAL | Status: DC | PRN

## 2015-10-30 MED ORDER — FUROSEMIDE 80 MG PO TABS: 80.0000 mg | ORAL_TABLET | Freq: Every day | ORAL | Status: DC

## 2015-10-30 MED ORDER — ONDANSETRON HCL 4 MG/2ML IJ SOLN: 4.0000 mg | Freq: Four times a day (QID) | INTRAMUSCULAR | Status: DC | PRN

## 2015-10-30 MED ORDER — CHLORHEXIDINE GLUCONATE 4 % EX LIQD
60.0000 mL | Freq: Once | CUTANEOUS | Status: DC
  Filled 2015-10-30: qty 60

## 2015-10-30 MED ORDER — SODIUM CHLORIDE 0.9 % IR SOLN
Status: DC | PRN
  Administered 2015-10-30: 09:00:00

## 2015-10-30 SURGICAL SUPPLY — 15 items
ADAPTER SEALING SSA-EW-09 (MISCELLANEOUS) ×2 IMPLANT
ADPR INTRO LNG 9FR SL XTD WNG (MISCELLANEOUS) ×1
ALLURE CRT PM3262 (Pacemaker) ×3 IMPLANT
CABLE SURGICAL S-101-97-12 (CABLE) ×2 IMPLANT
CATH ATTAIN COM SURV 6250V-MB2 (CATHETERS) ×2 IMPLANT
CATH HEXAPOLAR DAMATO 6F (CATHETERS) ×2 IMPLANT
KIT ESSENTIALS PG (KITS) ×2 IMPLANT
LEAD QUARTET 1458Q-86CM (Lead) ×2 IMPLANT
PACEMAKER ALLURE CRT (Pacemaker) IMPLANT
PAD DEFIB LIFELINK (PAD) ×2 IMPLANT
SHEATH CLASSIC 9.5F (SHEATH) ×2 IMPLANT
SHIELD RADPAD SCOOP 12X17 (MISCELLANEOUS) ×2 IMPLANT
SLITTER 6232ADJ (MISCELLANEOUS) ×2 IMPLANT
TRAY PACEMAKER INSERTION (PACKS) ×2 IMPLANT
WIRE ACUITY WHISPER EDS 4648 (WIRE) ×2 IMPLANT

## 2015-10-30 NOTE — H&P (View-Only) (Signed)
Electrophysiology Office Note   Date:  10/06/2015   ID:  Ian [name], DOB Sep 13, 1928, MRN [id number]  PCP:  Viviana Simpler, MD  Cardiologist:  Dr Gwenlyn Found Primary Electrophysiologist: Thompson Grayer, MD    Chief Complaint  Patient presents with  . Chronic dysfunction of LV     History of Present Illness: Ian [name] is a 82 y.o. male who presents today for electrophysiology evaluation.   He presents for further EP discussions about possible device upgrade. He continues to have tremors of parkinsons.  He has some fatigue and SOB but really is able to do most of what he desires.  His wife feels that he has declined.  He also has moderate to severe aortic prosthesis stenosis.  Today, he denies symptoms of palpitations, chest pain,  orthopnea, PND, lower extremity edema, claudication, dizziness, presyncope, syncope, or bleeding. The patient is tolerating medications without difficulties and is otherwise without complaint today.    Past Medical History  Diagnosis Date  . CAD (coronary artery disease)     a. s/p CABG with LIMA-LAD in 2001 with AVR. b. BMS to LCx 8/08. c. Abnl nuc/dec EF 05/2014 - s/p cath with patent LIMA to LAD, patent dominant right, mild LCx disease, patent stent. Med rx.  . Allergy   . Diverticulitis   . GERD (gastroesophageal reflux disease)   . Essential hypertension   . Osteoarthritis   . Osteoporosis   . MALT lymphoma ([place])   . Parkinson's disease   . TIA (transient ischemic attack) 06/08    a. 09/2006  . Hyperlipidemia   . ED (erectile dysfunction)   . Aortic valve stenosis, acquired     a. s/p Porcine AVR 2001;  b. 09/2014 Dobut Echo: mod-sev bioprosthetic AS w/ minimal change in CO and only mild increase in gradients w/ dobutamine.   . Renal artery stenosis (HCC)     a. 90% RRA stenosis previously. b. Duplex 09/2014: R renal artery 60-99%, L renal artery 1-59%.  . S/P cardiac pacemaker procedure, Medtronic Adapta L  ADDRL1, 01/21/12     a. 01/2012 s/p  MDT [place] PPM (ser # [id number]).  . Symptomatic bradycardia     a. 01/2012 s/p MDT [place] PPM (ser # [id number]).  . Chronic combined systolic (congestive) and diastolic (congestive) heart failure (HCC)     a. EF 50-55% in 02/2013;  b. 05/2014 Echo: EF 20-25%, Gr1 DD.  Marland Kitchen Nonischemic cardiomyopathy ([place])     a. 05/2014 Echo: EF 20-25%-->f/u cath w/ nonobs dzs.   Past Surgical History  Procedure Laterality Date  . Coronary artery bypass graft  2001    Lucianne Lei trigt ) aortic valve replacement 2001; LIMA-LAD.  Marland Kitchen Esophagogastroduodenoscopy      multiple, Colon/ EGD benign 11/2004  . Pilonidal cyst / sinus excision  1953  . Inguinal hernia repair      LIH 1997Llap. bilateral hernias 1998  . Coronary stent placement  11/2007    8/09  Left Circumflex Stent by Dr Toy Care started and aggrenox stopped  . Cataract extraction  06/09    left  . Laparoscopic cholecystectomy  07/11    Dr.Rosenbower  . Squamous cell carcinoma excision  3/13    back  . Aortic valve replacement  2001    Porcine valve  . Pacemaker insertion  01/21/2012    Medtronic Adapta L implanted for complete heart block  . Nm myocar perf wall motion  11/22/2007    mild septal ischemia,EF  66%  . Permanent pacemaker insertion N/A 01/21/2012    Procedure: PERMANENT PACEMAKER INSERTION;  Surgeon: Thompson Grayer, MD;  Location[facility] C[facility] LAB;  Service: Cardiovascular;  Laterality: N/A;  . Cardiac catheterization  06/20/2014    Procedure: CORONARY/GRAFT ANGIOGRAPHY;  Surgeon: Lorretta Harp, MD;  Location: [facility] CATH LAB;  Service: Cardiovascular;;  . Eye surgery  06/18/15     Current Outpatient Prescriptions  Medication Sig Dispense Refill  . aspirin 81 MG tablet Take 81 mg by mouth daily.    . bisoprolol-hydrochlorothiazide (ZIAC) 5-6.25 MG per tablet Take 1 tablet by mouth daily. 90 tablet 3  . carbidopa-levodopa (SINEMET CR) 50-200 MG tablet Take 1 tablet by mouth 6 (six) times daily. 540 tablet 1  .  carbidopa-levodopa (SINEMET IR) 25-100 MG tablet Take 2 tablets by mouth 5 (five) times daily. 900 tablet 1  . furosemide (LASIX) 80 MG tablet Take 1 tablet (80 mg total) by mouth daily. 90 tablet 3  . lansoprazole (PREVACID) 15 MG capsule Take 15 mg by mouth at bedtime. gerd    . polyethylene glycol (MIRALAX / GLYCOLAX) packet Take 17 g by mouth daily.    . potassium chloride (K-DUR,KLOR-CON) 10 MEQ tablet Take 10 mEq by mouth 2 (two) times daily.    . vitamin B-12 (CYANOCOBALAMIN) 1000 MCG tablet Take 1,000 mcg by mouth daily.     No current facility-administered medications for this visit.   Facility-Administered Medications Ordered in Other Visits  Medication Dose Route Frequency Provider Last Rate Last Dose  . DOBUTamine (DOBUTREX) 1,000 mcg/mL in dextrose 5% 250 mL infusion  5-20 mcg/kg/min (Order-Specific) Intravenous Continuous Carlena Bjornstad, MD 40.8 mL/hr at 09/20/14 1415 10 mcg/kg/min at 09/20/14 1415    Allergies:   Plavix; Pravastatin; Zocor; and Penicillins   Social History:  The patient  reports that he quit smoking about 52 years ago. His smoking use included Cigarettes. He has a 5 pack-year smoking history. He has never used smokeless tobacco. He reports that he does not drink alcohol or use illicit drugs.   Family History:  The patient's family history includes Asthma in his father; Heart disease in his brother.    ROS:  Please see the history of present illness.   All other systems are reviewed and negative.    PHYSICAL EXAM: VS:  BP 128/70 mmHg  Pulse 86  Ht 5\' 7"  (1.702 m)  Wt 151 lb (68.493 kg)  BMI 23.64 kg/m2  SpO2 94% , BMI Body mass index is 23.64 kg/(m^2). GEN: Well nourished, well developed, in no acute distress HEENT: normal Neck: no JVD, carotid bruits, or masses Cardiac: RRR (Paced) Respiratory:  clear to auscultation bilaterally, normal work of breathing GI: soft, nontender, nondistended, + BS MS: no deformity or atrophy Skin: warm and dry, device  pocket is well healed Neuro:  Very prominent parkinsonian tremor Psych: euthymic mood, full affect   Device interrogation is reviewed today in detail.  See PaceArt for details.   Recent Labs: 09/23/2015: BUN 14; Creat 0.73; Magnesium 2.1; Potassium 5.2; Sodium 138    Lipid Panel     Component Value Date/Time   CHOL 163 03/29/2012 1138   TRIG 75.0 03/29/2012 1138   HDL 54.80 03/29/2012 1138   CHOLHDL 3 03/29/2012 1138   VLDL 15.0 03/29/2012 1138   LDLCALC 93 03/29/2012 1138   LDLDIRECT 98.3 07/21/2006 1521     Wt Readings from Last 3 Encounters:  10/06/15 151 lb (68.493 kg)  09/09/15 148 lb 12.8 oz (67.495  kg)  08/19/15 151 lb (68.493 kg)      Other studies Reviewed: Additional studies/ records that were reviewed today include:  Ignacia Bayley and Dr Lurline Del notes reviewed, echo reviewed   ASSESSMENT AND PLAN:  1.  Chronic systolic dysfunction/ complete heart block Stable NYHA Class II symptoms.  These are likely multifactoral and related to parkinsons, reduced EF, and aortic valve stenosis Today, we again discussed upgrade of his pacemaker to a CRT device.  Given his advanced age, he is not a candidate for an ICD.  He has complete heart block and V paces 100% Risks, benefits, alternatives to pacemaker upgrade to CRT -P were discussed in detail with the patient today. The patient understands that the risks include but are not limited to bleeding, infection, pneumothorax, perforation, tamponade, vascular damage, renal failure, MI, stroke, death,  and lead dislodgement and wishes to proceed. We will therefore schedule the procedure at the next available time.  Today, I have spent 40  minutes with the patient discussing his CHF and possible device upgrade.  More than 50% of the visit time today was spent on this issue.    SignedThompson Grayer, MD  10/06/2015 5:02 PM     [address] [place] [address] [phone]  (office) [phone] (fax)

## 2015-10-30 NOTE — Discharge Instructions (Signed)
° ° °  Supplemental Discharge Instructions for  Pacemaker/Defibrillator Patients  Activity No heavy lifting or vigorous activity with your left/right arm for 6 to 8 weeks.  Do not raise your left/right arm above your head for one week.  Gradually raise your affected arm as drawn below.   NO DRIVING for 1 week  WOUND CARE - Keep the wound area clean and dry.  Do not get this area wet for one week. No showers for 10 days. - The tape/steri-strips on your wound will fall off; do not pull them off.  No bandage is needed on the site.  DO  NOT apply any creams, oils, or ointments to the wound area. - If you notice any drainage or discharge from the wound, any swelling or bruising at the site, or you develop a fever > 101? F after you are discharged home, call the office at once.  Special Instructions - You are still able to use cellular telephones; use the ear opposite the side where you have your pacemaker/defibrillator.  Avoid carrying your cellular phone near your device. - When traveling through airports, show security personnel your identification card to avoid being screened in the metal detectors.  Ask the security personnel to use the hand wand. - Avoid arc welding equipment, MRI testing (magnetic resonance imaging), TENS units (transcutaneous nerve stimulators).  Call the office for questions about other devices. - Avoid electrical appliances that are in poor condition or are not properly grounded. - Microwave ovens are safe to be near or to operate.

## 2015-10-30 NOTE — Interval H&P Note (Signed)
History and Physical Interval Note:  10/30/2015 6:53 AM  Ian [name]  has presented today for surgery, with the diagnosis of chf  The various methods of treatment have been discussed with the patient and family. After consideration of risks, benefits and other options for treatment, the patient has consented to  Procedure(s): BiV Pacemaker Upgrade (N/A) as a surgical intervention .  The patient's history has been reviewed, patient examined, no change in status, stable for surgery.  I have reviewed the patient's chart and labs.  Questions were answered to the patient's satisfaction.     Thompson Grayer

## 2015-10-31 ENCOUNTER — Telehealth: Payer: Self-pay | Admitting: Nurse Practitioner

## 2015-10-31 ENCOUNTER — Encounter (HOSPITAL_COMMUNITY): Payer: Self-pay | Admitting: Internal Medicine

## 2015-10-31 NOTE — Telephone Encounter (Signed)
Late entry -  Spoke with patient and wife this morning after same day discharge yesterday.  Pt doing well without significant pain. Reviewed post op instructions.  Chanetta Marshall, NP 10/31/2015 8:10 PM

## 2015-11-12 ENCOUNTER — Ambulatory Visit: Payer: Medicare Other | Admitting: Neurology

## 2015-11-13 ENCOUNTER — Encounter: Payer: Self-pay | Admitting: Internal Medicine

## 2015-11-13 ENCOUNTER — Ambulatory Visit (INDEPENDENT_AMBULATORY_CARE_PROVIDER_SITE_OTHER): Payer: Medicare Other | Admitting: *Deleted

## 2015-11-13 DIAGNOSIS — I5022 Chronic systolic (congestive) heart failure: Secondary | ICD-10-CM

## 2015-11-13 DIAGNOSIS — I442 Atrioventricular block, complete: Secondary | ICD-10-CM | POA: Diagnosis not present

## 2015-11-13 DIAGNOSIS — Z950 Presence of cardiac pacemaker: Secondary | ICD-10-CM

## 2015-11-13 LAB — CUP PACEART INCLINIC DEVICE CHECK
Brady Statistic RA Percent Paced: 73 %
Implantable Lead Implant Date: 20131004
Implantable Lead Implant Date: 20131004
Implantable Lead Implant Date: 20170713
Implantable Lead Location: 753858
Implantable Lead Location: 753859
Implantable Lead Model: 5076
Implantable Lead Model: 5076
Lead Channel Impedance Value: 425 Ohm
Lead Channel Impedance Value: 437.5 Ohm
Lead Channel Impedance Value: 737.5 Ohm
Lead Channel Pacing Threshold Amplitude: 0.75 V
Lead Channel Pacing Threshold Amplitude: 0.75 V
Lead Channel Pacing Threshold Amplitude: 1.625 V
Lead Channel Pacing Threshold Amplitude: 1.75 V
Lead Channel Pacing Threshold Pulse Width: 0.4 ms
Lead Channel Pacing Threshold Pulse Width: 0.4 ms
Lead Channel Pacing Threshold Pulse Width: 1 ms
Lead Channel Pacing Threshold Pulse Width: 1 ms
Lead Channel Sensing Intrinsic Amplitude: 12 mV
Lead Channel Setting Pacing Amplitude: 2 V
Lead Channel Setting Pacing Amplitude: 2.5 V
Lead Channel Setting Pacing Pulse Width: 0.4 ms
MDC IDC LEAD LOCATION: 753860
MDC IDC MSMT BATTERY REMAINING LONGEVITY: 82.8
MDC IDC MSMT BATTERY VOLTAGE: 3.1 V
MDC IDC MSMT LEADCHNL RA SENSING INTR AMPL: 2.9 mV
MDC IDC MSMT LEADCHNL RV PACING THRESHOLD AMPLITUDE: 1 V
MDC IDC MSMT LEADCHNL RV PACING THRESHOLD AMPLITUDE: 1 V
MDC IDC MSMT LEADCHNL RV PACING THRESHOLD PULSEWIDTH: 0.4 ms
MDC IDC MSMT LEADCHNL RV PACING THRESHOLD PULSEWIDTH: 0.4 ms
MDC IDC PG SERIAL: 7933792
MDC IDC SESS DTM: 20170727131626
MDC IDC SET LEADCHNL LV PACING AMPLITUDE: 2.125
MDC IDC SET LEADCHNL LV PACING PULSEWIDTH: 1 ms
MDC IDC SET LEADCHNL RV SENSING SENSITIVITY: 4 mV
MDC IDC STAT BRADY RV PERCENT PACED: 98 %

## 2015-11-13 NOTE — Progress Notes (Signed)
Referred to ICM clinic by Emma Long, Device RN.  Met patient in the office with wife, Barbara.  Explained ICM program.  Patient will check with billing department regarding if Code 93297 is covered.  Will call patient in a week to check if would like to enroll in ICM.   

## 2015-11-13 NOTE — Progress Notes (Signed)
Wound check appointment. Steri-strips previously removed by patient. Wound without redness or edema. Incision edges approximated, wound well healed. Normal device function. RA and RV thresholds, sensing, and impedances consistent with implant measurements. LV threshold increased to 2.5V @ 0.62ms/ 1.75V @ 47ms. Multi vector testing performed and results printed. No vector programming changes. LV pulse width increased to 23ms, LV cap confirm turned on with a 0.5V safety margin. Device programmed at 3.5V for extra safety margin until 3 month visit. Histogram distribution appropriate for patient and level of activity. 1 mode switch- 6 seconds, AT. No high ventricular rates noted. Patient educated about wound care, arm mobility, lifting restrictions and remote monitoring. Referred to ICM clinic- intermittent LE edema and SOB. ROV with Dr. Rayann Heman 03/15/16.

## 2015-11-19 ENCOUNTER — Telehealth: Payer: Self-pay

## 2015-11-19 ENCOUNTER — Ambulatory Visit: Payer: Medicare Other | Admitting: Neurology

## 2015-11-19 NOTE — Telephone Encounter (Signed)
Received voice mail message from wife (DPR) and she stated she would like for patient to be monitored on monthly basis.  She stated she checked with billing department and service should be covered.    Attempted call back to wife and no answer or answering machine.  Scheduled 1st ICM remote transmission for 12/25/2015.

## 2015-11-21 NOTE — Telephone Encounter (Signed)
Spoke with wife regarding ICM monthly follow up.  She agreed to follow up and encouraged to call if patient has any fluid symptoms.  1st ICM remote transmission scheduled for 12/25/2015.

## 2015-11-21 NOTE — Progress Notes (Signed)
Ian [name] was seen today in the movement disorders clinic for f/u.  He is accompanied by his wife who supplements the history.  The first symptom(s) the patient noticed was tremor in the R hand when he would use the hand or have a cup of coffee in it.  His wife believes that this began in 2004.  He was referred to Dr. Tilden Dome.  He tried some medication and according to his wife, he kept having adverse SE.  He was on mirapex and sinemet but reported dizziness and bad dreams with both.   He was referred to Dr. Yevonne Pax in about 2010 because he was interested in DBS but the patient was concerned about risks of the surgery and potential side effects.    09/28/12  I tried to change the levodopa back to the IR formulation but he did not like it because he was too drowsy and went back to the CR formulation.  , 2 po qid and carbidopa/levodopa 50/200 at night.    He has continued to have more tremor after the artane was d/c.  However, this has become less bothersome for him over time.  He does notice that sometimes the medication wears off quickly and sometimes it does not.  It seems inconsistent.  03/06/13 update:  The patient is accompanied by his wife who supplements the history.  Last visit, we tried to add entacapone to each of the 4 carbidopa/levodopa dosing times.  He called me because of nausea and we decreased it to twice a day dosing.  He continued to have nausea and the medication was discontinued.  The patient states that the medication makes his arms feel "heavy." He has stopped the carbidopa/levodopa 50/200 at night as he does not think it was beneficial.  Sometimes, he will wake up in the middle of the night and take an extra of the carbidopa/levodopa CR 25 100.  He and his wife have noted some jerking spells at night.  He sleeps very restless. I did review notes in his chart since last visit.  He went to the emergency room on September 21 with a sensation of palpitations and tachycardia.  His  pacemaker was interrogated and there were no problems and he was subsequently sent home.  06/11/13 update:  This patient is accompanied in the office by his spouse who supplements the history.  Pt is on carbidopa/levodopa 25/100 CR - 2 in the AM and 6 other dosages throughout the day.  When he awakens in the middle of the night he may take a few extra dosages and he estimates he takes a total daily levodopa dose of 1000-1200 mg per day.  He has been very senstive to medication and has not been able to tolerate the immediate release carbidopa/levodopa, entacapone or klonopin (made arms/legs hurt).  Tremor feels like it is getting worse.  Vision is getting worse but he went to the eye doctor and his vision was stable.  He feels like the levodopa only lasts 1.5-2 hrs.  No falls.  Is exercising.    08/13/13 update:   This patient is accompanied in the office by his spouse who supplements the history.  Pt is on carbidopa/levodopa 25/100, and takes approximately 8-10 tablets per day.  Last visit, I also added amantadine to see if that would help the tremor. He does think that has helped.  He presents today to try and change to rytary to see if it would last longer.  He does not wish to  try duopa.  He denies hallucinations, lightheadedness, falls, syncope or dyskinesia.     10/01/13 update:  Last visit, I changed the patient to rytary.  It turned out to be very expensive as the patient had no RX coverage, which I did not realize.  When he changed back, the IR formulation was accidentally called in instead of the CR prepartation (and he didn't tolerate the IR in the past).  The patient also didn't notice that and he isn't even sure now what he is taking but knows he is taking 2 po qid of either the CR or IR.  He is having hallucinations.  He is having swelling in the feet and legs as well.   They saw cardiology last week and were started on lasix and they didn't think that it made a difference.  They are worried  about a discoloration in legs.  He has lost weight.  Had one fall on May 10.  Walking into bedroom, got lightheaded and hit his back on the dresser.  He didn't think that he hit his head at the time but now that they think that he did as he has had hallucinations since.  However, looking back at our notes, it was 5/7 when he called here to d/c the rytary and changed to the IR levodopa it was 5/10 when he fell so timing was likely just coincidental as he had likely just changed back to the IR preparation.  11/12/13 update:  The patient was seen back in followup in the neurology clinic, accompanied by his wife who helps to supplement the history.  Patient has a history of Parkinson's disease.  Last visit, I changed his levodopa back to the CR preparation to see if that would help his hallucinations.  He is currently on 2 tablets by mouth 4 times per day (8am/12/4pm/8pm).  Today, the patient states that hallucinations continue.  He states that he continues to see people.   Medication helps tremor when he first takes it and then the tremor comes back before the next dose.   Last visit, I told him to discontinue his amantadine because of complaints of swelling.  I did review records since our last visit.  Because he noticed no changes in swelling after discontinuation of the amantadine, he followed up with his primary care physician.  His primary care physician felt that the swelling was likely due to venous insufficiency and he recommended that the patient restart the amantadine and he has.  The patient does state that he doesn't think that he had any break between d/c the IR carbidopa/levodopa 25/100, starting the CR version and when he restarted the amantadine.   The patient did see his cardiologist and he saw no cardiac reason for the edema.  He did not want to increase the patient's Lasix further because of complaints of orthostasis and dizziness.  No significant etiology was determined for his weight loss  either.  01/07/14 update:  The patient was seen today in followup in the neurology clinic, accompanied by his wife who helps to supplement the history.  The patient has switched back on his own from the CR version of carbidopa/levodopa to the immediate release form.  He is on 2 po 5 times per day (8am and then every 3 hours).  He is complaining about more tremor and more balance issues.  No falls but near falls.  He is off of amantadine.  Hallucinations went away after it was d/c but tremor definitely picked up.  Leg swelling is better but not gone.  Some memory problems.  No driving since march, 2015.    05/06/14 update:  The patient is seen in f/u, accompanied by his wife who supplements the history.   He is on carbidopa/levodopa 25/100 CR, 2 po 5 times per day (8am and then every 3 hours). He continues to c/o increasing tremor.  States that he is having m. Cramping at nighttime.  His last dose is at 8pm and bedtime is at 10 pm and cramping will start waking him at 3:30 am. Asks about trying to take a combination of one CR and one IR and see how that works.   He states that his memory is "going downhill" although his wife is not quite as sure.  He doesn't drive and hasn't for a year but has not trouble with pills.    08/06/14 update:  The patient is following up today, accompanied by his wife who supplements the history.  Last visit, the patient wanted to try a combination of carbidopa levodopa 25/100 immediate release and extended release so that he was taking 1 pill of each 5 times per day; however he really never did that and is still taking a carbidopa/levodopa 25/100 IR, 2 po 5 times a day.  We did add a carbidopa/levodopa 50/200 at night to see if that would help with his cramping;  The cramping is better at night and he only seems to have it if he has swelling.  His wife mentions that memory is not quite as good as it used to be.  He has some difficulty remembering previous conversations and having word  finding difficulties.  It is not a major issue, but it is something she is noticing.  Much has happened in his other medical history since last visit.  I reviewed those records.  The patient had a nuclear stress test on 06/13/2014, demonstrating a high risk study with a large area of ischemia in the LAD artery distribution and a fixed defect in the inferior wall.  An echocardiogram demonstrated a left ventricular ejection fraction of approximately 20%, which was much decreased compared to his prior study of 50-55%.  A heart catheterization was subsequently scheduled and performed on 06/20/2014.  There was no evidence of ischemia to account for the dropped ejection fraction.  It was felt that he was likely not a candidate for an ICD and they talked about CRT and decided to hold on that.  They saw Dr. Rayann Heman yesterday.  12/03/14 update:  The patient presents today, accompanied by his wife who supplements the history.  The records that were made available to me were reviewed.  Last visit, we talked about our limited options, and ultimately decided to try a combination of carbidopa/levodopa 25/100 IR and carbidopa/levodopa 25/100 CR, one tablet each of these 5 times per day.  There was some misunderstanding and he ended up taking carbidopa/levodopa 25/100 IR in addition to carbidopa/levodopa 50/200 CR, 5 times per day.  He was then taking the carbidopa/levodopa 50/200 at night as well.  Overall, this helped the tremor but hallucinations picked up and therefore he went back to carbidopa/levodopa 25/100 IR, 2 po 5 times a day.  He is having more difficulty opening his hands in the morning.  Pts biggest c/o is vision change and he thinks that it is from his PD meds.  Been to [facility] in November and I reviewed those records and they said that they thought that it was primarily surface related  but didn't think that it was significant.  He tried eye drops for dry eye and nothing helped.  He asks me multiple times what else he  can do.  His wife also asks me about a referral for a consultation regarding focused ultrasound.  04/04/15 update:  The patient is following up today, accompanied by his wife who supplements the history.  He has a history of Parkinson's disease.  He is on carbidopa/levodopa 25/100, 2 tablets 5 times per day and he went back on carbidopa/levodopa 50/200 qhs.  Last visit, he would not levodopa was affecting his vision, although I did not.  Regardless, we decided to stop the medication for a few days.  He was not able to tolerate being off the medication because tremor was so bad and ended up restarting it fairly quickly after stopping it.  Tremor continues to be bothersome and asks if there is anything else he can do.  Depression is getting worse because of that.  No suicidal or homicidal ideation.  He is doing yoga with his wife and enjoys that.  He has had no falls but has had some near falls.  I did refer him to Dr. Sanda Klein regarding his vision change and he is going in Jan (pt moved appt back).  His wife had also requested a referral last visit to [facility] to possible focused ultrasound, but ultimately they decided that they did not want to go to the appointment.  I think that is probably a good decision as focused u/s is not indicated yet for Parkinsons disease.  They do bring in a brochure today about duopa and ask me about that today.  Also saw an article in paper about study Dr. Jimmey Ralph is doing at [facility] and he called the number.  He showed me the article today and it was for patients who have had little weak body dementia and Parkinson's dementia with hallucinations.  He has begun to have a few more hallucinations since last visit, but reports that he knows that they are not real.  07/10/15 update:  The patient is following up today, accompanied by his wife who supplements the history.  He has a history of Parkinson's disease.  He is on carbidopa/levodopa 25/100, 2 tablets 5 times per day (8/11/2/5/8) in  addition to carbidopa/levodopa 50/200 at night (10pm). He thinks that the medication wears off before next visit and asks about taking the 50/200 in addition to one 25/100.  I reminded him that he accidentally tried that last august but he had more hallucinations.  He states that he just tried that recently with the 25/100 just like he did last august and didn't have the hallucinations.  He did try Neupro since our last visit and worked up to 4 mg.  He really did not find this beneficial.  He did not have side effects with that.  It did not make hallucinations worse, and in fact he thought they were potentially even better.  He did not want to go up to 6 mg and ended up discontinuing the medication.  He has not had any falls since last visit but does feel that he has been more unstable.  Sometimes will yell out at night but seems infrequent.  No hallucinations or near syncope.  I did review records since last visit.  He was seen back at [facility] and ended up having eye surgery for lower eyelid retraction bilaterally on March 1.    11/24/15 update:  The patient is following  up today, accompanied by his wife who supplements the history.  After our last visit, the patient was supposed to be on carbidopa/levodopa 50/200 CR 5 times per day and no more than carbidopa/levodopa 25/100 3 times per day.  He called me quite some time after last visit to state that he had gone up on the carbidopa/levodopa 50/200, to 6 times per day and carbidopa/levodopa 25/100, 5 times per day and subsequently hallucinations had become very prominent.  I explained that unfortunately, this is why I did not want him to go up on the medication, as he was not a Nuplazid candidate because of the fact that he already had QT prolongation.  He went back to taking them as he was supposed to but he is still having some hallucinations.  He recognizes that they aren't real.  No falls since our last visit.  He is exercising some but gets tired quickly.   They asked me about xadago.   On July 13, he did undergo surgical placement of a new pacemaker.  He didn't notice a big difference in stamina with the new PPM but he has been able to breathe better.    Neuroimaging has previously been performed.  It is not available for my review today.  PREVIOUS MEDICATIONS: Sinemet (reported bad dreams/dizziness on regular formulation and same with Mirapex); requip;  on Sinemet CR currently, entacapone (felt dizzy and arms felt "heavy"); klonopin (made arms/legs hurt); artane; amantadine (hallucinations); rytary (costly and pt has no RX coverage); neupro  ALLERGIES:   Allergies  Allergen Reactions  . Plavix [Clopidogrel]     Fatigue   . Pravastatin     Muscle aches  . Zocor [Simvastatin]     REACTION: dizziness  . Penicillins Rash    Has patient had a PCN reaction causing immediate rash, facial/tongue/throat swelling, SOB or lightheadedness with hypotension: YES Has patient had a PCN reaction causing severe rash involving mucus membranes or skin necrosis: NO Has patient had a PCN reaction that required hospitalization NO Has patient had a PCN reaction occurring within the last 10 years: NO If all of the above answers are "NO", then may proceed with Cephalosporin use.    CURRENT MEDICATIONS:  Current Outpatient Prescriptions on File Prior to Visit  Medication Sig Dispense Refill  . aspirin 81 MG tablet Take 81 mg by mouth daily.    . bisoprolol-hydrochlorothiazide (ZIAC) 5-6.25 MG per tablet Take 1 tablet by mouth daily. 90 tablet 3  . carbidopa-levodopa (SINEMET CR) 50-200 MG tablet Take 1 tablet by mouth 6 (six) times daily. 540 tablet 1  . carbidopa-levodopa (SINEMET IR) 25-100 MG tablet Take 2 tablets by mouth 5 (five) times daily. (Patient taking differently: Take 1 tablet by mouth 3 (three) times daily. ) 900 tablet 1  . furosemide (LASIX) 80 MG tablet Take 1 tablet (80 mg total) by mouth daily. 90 tablet 3  . lansoprazole (PREVACID) 15 MG  capsule Take 15 mg by mouth daily as needed (Approx 2 times weekly). gerd    . Polyethyl Glycol-Propyl Glycol (SYSTANE ULTRA OP) Place 1 drop into both eyes daily.    . polyethylene glycol (MIRALAX / GLYCOLAX) packet Take 17 g by mouth daily.    . potassium chloride (K-DUR,KLOR-CON) 10 MEQ tablet Take 10 mEq by mouth daily.     . vitamin B-12 (CYANOCOBALAMIN) 1000 MCG tablet Take 1,000 mcg by mouth daily.     Current Facility-Administered Medications on File Prior to Visit  Medication Dose Route Frequency Provider  Last Rate Last Dose  . DOBUTamine (DOBUTREX) 1,000 mcg/mL in dextrose 5% 250 mL infusion  5-20 mcg/kg/min (Order-Specific) Intravenous Continuous Carlena Bjornstad, MD 40.8 mL/hr at 09/20/14 1415 10 mcg/kg/min at 09/20/14 1415    PAST MEDICAL HISTORY:   Past Medical History:  Diagnosis Date  . Allergy   . Aortic valve stenosis, acquired    a. s/p Porcine AVR 2001;  b. 09/2014 Dobut Echo: mod-sev bioprosthetic AS w/ minimal change in CO and only mild increase in gradients w/ dobutamine.   Marland Kitchen CAD (coronary artery disease)    a. s/p CABG with LIMA-LAD in 2001 with AVR. b. BMS to LCx 8/08. c. Abnl nuc/dec EF 05/2014 - s/p cath with patent LIMA to LAD, patent dominant right, mild LCx disease, patent stent. Med rx.  . Chronic combined systolic (congestive) and diastolic (congestive) heart failure (HCC)    a. EF 50-55% in 02/2013;  b. 05/2014 Echo: EF 20-25%, Gr1 DD.  Marland Kitchen Diverticulitis   . ED (erectile dysfunction)   . Essential hypertension   . GERD (gastroesophageal reflux disease)   . Hyperlipidemia   . MALT lymphoma ([place])   . Nonischemic cardiomyopathy ([place])    a. 05/2014 Echo: EF 20-25%-->f/u cath w/ nonobs dzs.  . Osteoarthritis   . Osteoporosis   . Parkinson's disease   . Renal artery stenosis (HCC)    a. 90% RRA stenosis previously. b. Duplex 09/2014: R renal artery 60-99%, L renal artery 1-59%.  . S/P cardiac pacemaker procedure, Medtronic Adapta L  ADDRL1, 01/21/12    a. 01/2012  s/p MDT [place] PPM (ser # [id number]).  . Symptomatic bradycardia    a. 01/2012 s/p MDT [place] PPM (ser # [id number]).  Marland Kitchen TIA (transient ischemic attack) 06/08   a. 09/2006    PAST SURGICAL HISTORY:   Past Surgical History:  Procedure Laterality Date  . AORTIC VALVE REPLACEMENT  2001   Porcine valve  . CARDIAC CATHETERIZATION  06/20/2014   Procedure: CORONARY/GRAFT ANGIOGRAPHY;  Surgeon: Lorretta Harp, MD;  Location[facility] C[facility] LAB;  Service: Cardiovascular;;  . CATARACT EXTRACTION  06/09   left  . CORONARY ARTERY BYPASS GRAFT  2001   ( van trigt ) aortic valve replacement 2001; LIMA-LAD.  Marland Kitchen CORONARY STENT PLACEMENT  11/2007   8/09  Left Circumflex Stent by Dr Toy Care started and aggrenox stopped  . EP IMPLANTABLE DEVICE N/A 10/30/2015   Procedure: BiV Pacemaker Upgrade;  Surgeon: Thompson Grayer, MD;  Location: [place] CV LAB;  Service: Cardiovascular;  Laterality: N/A;  . ESOPHAGOGASTRODUODENOSCOPY     multiple, Colon/ EGD benign 11/2004  . EYE SURGERY  06/18/15  . INGUINAL HERNIA REPAIR     LIH 1997Llap. bilateral hernias 1998  . LAPAROSCOPIC CHOLECYSTECTOMY  07/11   Dr.Rosenbower  . NM MYOCAR PERF WALL MOTION  11/22/2007   mild septal ischemia,EF 66%  . PACEMAKER INSERTION  01/21/2012   Medtronic Adapta L implanted for complete heart block  . PERMANENT PACEMAKER INSERTION N/A 01/21/2012   Procedure: PERMANENT PACEMAKER INSERTION;  Surgeon: Thompson Grayer, MD;  Location: [facility] CATH LAB;  Service: Cardiovascular;  Laterality: N/A;  . PILONIDAL CYST / SINUS EXCISION  1953  . SQUAMOUS CELL CARCINOMA EXCISION  3/13   back    SOCIAL HISTORY:   Social History   Social History  . Marital status: Married    Spouse name: N/A  . Number of children: 3  . Years of education: N/A   Occupational History  .  retired- [employer]   . part-time sub/courier for schools- now only rarely    Social History Main Topics  . Smoking status: Former Smoker    Packs/day:  1.00    Years: 5.00    Types: Cigarettes    Quit date: 06/06/1963  . Smokeless tobacco: Never Used  . Alcohol use No     Comment: very rare  . Drug use: No  . Sexual activity: No   Other Topics Concern  . Not on file   Social History Narrative   Has living will   Wife, then son [name], hold health care POA   Requests DNR --done   Requests no tube feeds if cognitively unaware    FAMILY HISTORY:   Family Status  Relation Status  . Mother Deceased at age 89   old age  . Father Deceased at age 64   acute bronchitis, cotton exposure  . Sister Deceased   trauma  . Brother Deceased   2, deceased  . Daughter Deceased at age 75   osteosarcoma  . Child Alive   3, healthy  . Brother Alive   healthy  . Sister Alive   healthy    ROS:    A complete 10 system review of systems was obtained and was unremarkable apart from what is mentioned above.  PHYSICAL EXAMINATION:    VITALS:   Vitals:   11/24/15 1056  BP: 122/72  BP Location: Right Arm  Patient Position: Sitting  Cuff Size: Normal  Pulse: 80  Weight: 149 lb (67.6 kg)  Height: 5\' 6"  (1.676 m)   Wt Readings from Last 3 Encounters:  11/24/15 149 lb (67.6 kg)  10/30/15 145 lb (65.8 kg)  10/24/15 149 lb (67.6 kg)     GEN:  The patient appears stated age and is in NAD.   He is a bit tearful when talking about cardiac status. HEENT:  Normocephalic, atraumatic.  The mucous membranes are moist. The superficial temporal arteries are without ropiness or tenderness. CV:  RRR Lungs:  CTAB Neck/HEME:  There are no carotid bruits bilaterally.  Neurological examination:  Orientation: The patient is alert and oriented x3.  He is a very good historian today. Montreal Cognitive Assessment  12/03/2014  Visuospatial/ Executive (0/5) 2  Naming (0/3) 3  Attention: Read list of digits (0/2) 1  Attention: Read list of letters (0/1) 0  Attention: Serial 7 subtraction starting at 100 (0/3) 2  Language: Repeat phrase (0/2) 2   Language : Fluency (0/1) 0  Abstraction (0/2) 1  Delayed Recall (0/5) 2  Orientation (0/6) 5  Total 18  Adjusted Score (based on education) 18    Movement examination: Tone: There is normal tone in the bilateral upper extremities today.  The tone in the lower extremities is normal.  Abnormal movements: There is a moderate resting tremor bilaterally, R>L.  No dyskinesia. Coordination:  There is no decremation, with any form of RAMS, including alternating supination and pronation of the forearm, hand opening and closing, finger taps, heel taps and toe taps. Gait and Station: The patient has mild difficulty arising out of a deep-seated chair without the use of the hands. The patient's stride length is normal.  He has mild postural instability.    Lab Results  Component Value Date   WBC 8.2 10/23/2015   HGB 13.2 10/23/2015   HCT 38.7 10/23/2015   MCV 91.3 10/23/2015   PLT 321 10/23/2015     Chemistry  Component Value Date/Time   NA 139 10/23/2015 1352   K 4.3 10/23/2015 1352   CL 97 (L) 10/23/2015 1352   CO2 33 (H) 10/23/2015 1352   BUN 17 10/23/2015 1352   CREATININE 0.89 10/23/2015 1352      Component Value Date/Time   CALCIUM 9.1 10/23/2015 1352   ALKPHOS 116 09/05/2013 1329   AST 15 09/05/2013 1329   ALT 10 09/05/2013 1329   BILITOT 0.8 09/05/2013 1329     Lab Results  Component Value Date   VITAMINB12 660 03/06/2013   Lab Results  Component Value Date   TSH 4.349 06/17/2014     ASSESSMENT/PLAN:  1.  Idiopathic Parkinsons disease.    -Long discussion again with the patient and his wife.  Unfortunately, he really is in a position where we cannot go up further on the levodopa because of hallucinations and he is not a candidate for Nuplazid because of prolonged QT.  He has not a DBS candidate because of multiple other medical problems.  He will take carbidopa/levodopa 50/200 CR, 5 times per day and carbidopa/levodopa 25/100 IR, up to 3times per day.  -asked me  about xadago and I meet with the rep tomorrow.  Will have my staff call him but he still doesn't have RX insurance which may be an issue.   2.  Mild cognitive impairment, likely from longstanding PD.  -I think this is from long-standing Parkinson's disease.  We talked about the acetylcholinesterase inhibitors.  He really is not interested in Exelon pills because of the risk of nausea.  We talked about the Exelon patches but they're quite expensive.  I think Aricept would work as well but he just doesn't want another med right now.  As above, he has been having more hallucinations lately and I am a little worried that the [facility] may pick this up.  He will let me know. 3.  B12 deficiency.  -He is on oral supplements  4.  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  -He could not tolerate klonopin.  Is overall mild and talked about home safety 5.  I. will see him back in the next few months but will call him tomorrow after I meet with the [facility] rep.  , sooner should new neurologic issues arise.  Much greater than 50% of this visit was spent in counseling with the patient and the family.  Total face to face time:  25 min

## 2015-11-24 ENCOUNTER — Ambulatory Visit (INDEPENDENT_AMBULATORY_CARE_PROVIDER_SITE_OTHER): Payer: Medicare Other | Admitting: Neurology

## 2015-11-24 ENCOUNTER — Encounter: Payer: Self-pay | Admitting: Neurology

## 2015-11-24 VITALS — BP 122/72 | HR 80 | Ht 66.0 in | Wt 149.0 lb

## 2015-11-24 DIAGNOSIS — I701 Atherosclerosis of renal artery: Secondary | ICD-10-CM | POA: Diagnosis not present

## 2015-11-24 DIAGNOSIS — G20 Parkinson's disease: Secondary | ICD-10-CM | POA: Diagnosis not present

## 2015-11-24 DIAGNOSIS — R441 Visual hallucinations: Secondary | ICD-10-CM | POA: Diagnosis not present

## 2015-11-24 MED ORDER — CARBIDOPA-LEVODOPA 25-100 MG PO TABS: 1.0000 | ORAL_TABLET | Freq: Three times a day (TID) | ORAL | 1 refills | Status: DC

## 2015-11-24 MED ORDER — CARBIDOPA-LEVODOPA ER 50-200 MG PO TBCR: 1.0000 | EXTENDED_RELEASE_TABLET | Freq: Every day | ORAL | 1 refills | Status: DC

## 2015-11-24 NOTE — Patient Instructions (Signed)
We will call you tomorrow.  I will call in your carbidopa/levodopa 25/100 IR and carbidopa/levodopa CR RX to the pharmacy

## 2015-11-25 ENCOUNTER — Telehealth: Payer: Self-pay | Admitting: Neurology

## 2015-11-25 NOTE — Telephone Encounter (Signed)
Left message on machine for patient to call back.

## 2015-11-25 NOTE — Telephone Encounter (Signed)
Patient made aware. Will speak to his wife and call me back.

## 2015-11-25 NOTE — Telephone Encounter (Signed)
[name] 04/23/2028. The # is [id number]. His wife [name] called regarding medication for her husband. Thank you

## 2015-11-25 NOTE — Telephone Encounter (Signed)
Tell patient that while I have samples of xadago, it likely will be very expensive as it is not on [facility] formulary.  Not sure that he is wanting to try that?

## 2015-11-26 NOTE — Telephone Encounter (Signed)
Spoke with patient and we are not proceeding with Xadago at this time.

## 2015-11-27 ENCOUNTER — Telehealth: Payer: Self-pay | Admitting: Neurology

## 2015-11-27 NOTE — Telephone Encounter (Signed)
Left message on machine for patient to call back.

## 2015-11-27 NOTE — Telephone Encounter (Signed)
error 

## 2015-11-27 NOTE — Telephone Encounter (Signed)
Patient wife [name] called and states patient is confused and would like a phone call back to see if she can understand  Please call [phone]

## 2015-11-27 NOTE — Telephone Encounter (Signed)
Patient correct phone number is [phone] sorry must of hit the wrong button

## 2015-11-27 NOTE — Telephone Encounter (Signed)
Patient's wife [name] called back. She would like to talk with you. She just wanted to know what was discussed yesterday with her husband. He was confused and couldn't tell her. Her # is [id number] Thank you

## 2015-11-27 NOTE — Telephone Encounter (Signed)
Spoke with patient's wife and explained [name] conversation I had with patient. They will call back if they want to start medication.

## 2015-12-01 ENCOUNTER — Telehealth: Payer: Self-pay | Admitting: Neurology

## 2015-12-01 MED ORDER — SAFINAMIDE MESYLATE 50 MG PO TABS: 1.0000 | ORAL_TABLET | Freq: Every day | ORAL | 0 refills | Status: DC

## 2015-12-01 MED ORDER — SAFINAMIDE MESYLATE 100 MG PO TABS: 1.0000 | ORAL_TABLET | Freq: Every day | ORAL | 0 refills | Status: DC

## 2015-12-01 NOTE — Telephone Encounter (Signed)
Patient came into the office deciding he wanted to try [facility]. Given 50 mg to take once a day for two weeks, then to switch to 100 mg once a day. Given a month of samples and 100 mg tablets sent to pharmacy. Patient aware this will not be covered by insurance and to have any chance with an appeal he would more than likely have to try azilect first. They expressed understanding. They will let us know how he does on medication.

## 2015-12-05 ENCOUNTER — Other Ambulatory Visit: Payer: Self-pay | Admitting: Cardiovascular Disease

## 2015-12-05 NOTE — Telephone Encounter (Signed)
Rx request sent to pharmacy.  

## 2015-12-08 ENCOUNTER — Telehealth: Payer: Self-pay | Admitting: Neurology

## 2015-12-08 NOTE — Telephone Encounter (Signed)
Patient and wife made aware

## 2015-12-08 NOTE — Telephone Encounter (Signed)
[name] Jun 25, 2028. His wife was calling regarding his medication. His # is [id number]. Thank you

## 2015-12-08 NOTE — Telephone Encounter (Signed)
Called patient back, he wanted to let me know how he is doing on [place]. He is currently on day 8. He and his wife report about a 20% decrease in tremor.  He has increase in balance issues and hallucinations - which are the most concerning.  He has also had trouble sleeping, has little energy, and his vision seems worse. His blood pressures have been running 152/40-159/50. He has also had increase in paranoia.  Please advise.

## 2015-12-08 NOTE — Telephone Encounter (Signed)
Needs to d/c the med with those SE

## 2015-12-17 ENCOUNTER — Telehealth: Payer: Self-pay | Admitting: Neurology

## 2015-12-17 DIAGNOSIS — R413 Other amnesia: Secondary | ICD-10-CM

## 2015-12-17 DIAGNOSIS — R410 Disorientation, unspecified: Secondary | ICD-10-CM

## 2015-12-17 DIAGNOSIS — R443 Hallucinations, unspecified: Secondary | ICD-10-CM

## 2015-12-17 NOTE — Telephone Encounter (Signed)
Patient's wife made aware. They will have labs drawn tomorrow.

## 2015-12-17 NOTE — Telephone Encounter (Signed)
PT's wife [name] called in regards to PT and would like a call back at [phone]

## 2015-12-17 NOTE — Telephone Encounter (Signed)
He does have some dementia, but likely parkinsons dementia but would check UA, cbc, chem first and make sure that nothing else going on

## 2015-12-17 NOTE — Telephone Encounter (Addendum)
Spoke with patient's wife and she is very concerned about patient and does not know what to do. His hallucinations are much worse now and he is becoming very confused all the time and accusatory.   She states it started getting worse when they started [place], so she thought it was the medication, but he continues to get worse daily.  She states he is asking questions like "who was that girl in bed with [place] this morning" and "why did you slam down that box" but when she tells him those things didn't happen he doesn't believe her and accuses her of lying. They have lived in the same house for years and putting away things lately he is putting potholders in the fridge and plates in the microwave. He has become very persistent in this and she is very worried about this being more dementia or alzheimer's disease.   She would like him to see someone about this as soon as possible. His last MOCA looks like about a year ago at 77.   Please advise next steps. Patient's wife is having a really hard time with him.

## 2015-12-18 ENCOUNTER — Other Ambulatory Visit: Payer: Medicare Other

## 2015-12-18 DIAGNOSIS — R443 Hallucinations, unspecified: Secondary | ICD-10-CM | POA: Diagnosis not present

## 2015-12-18 DIAGNOSIS — R413 Other amnesia: Secondary | ICD-10-CM

## 2015-12-18 DIAGNOSIS — R410 Disorientation, unspecified: Secondary | ICD-10-CM | POA: Diagnosis not present

## 2015-12-18 LAB — COMPREHENSIVE METABOLIC PANEL
ALK PHOS: 64 U/L (ref 40–115)
AST: 13 U/L (ref 10–35)
Albumin: 3.6 g/dL (ref 3.6–5.1)
BILIRUBIN TOTAL: 0.5 mg/dL (ref 0.2–1.2)
BUN: 14 mg/dL (ref 7–25)
CALCIUM: 9.3 mg/dL (ref 8.6–10.3)
CO2: 29 mmol/L (ref 20–31)
Chloride: 102 mmol/L (ref 98–110)
Creat: 0.94 mg/dL (ref 0.70–1.11)
GLUCOSE: 96 mg/dL (ref 65–99)
Potassium: 4.5 mmol/L (ref 3.5–5.3)
Sodium: 142 mmol/L (ref 135–146)
Total Protein: 6.6 g/dL (ref 6.1–8.1)

## 2015-12-18 LAB — CBC WITH DIFFERENTIAL/PLATELET
BASOS PCT: 1 %
Basophils Absolute: 83 cells/uL (ref 0–200)
EOS PCT: 2 %
Eosinophils Absolute: 166 cells/uL (ref 15–500)
HEMATOCRIT: 38.8 % (ref 38.5–50.0)
Hemoglobin: 13.3 g/dL (ref 13.2–17.1)
LYMPHS PCT: 19 %
Lymphs Abs: 1577 cells/uL (ref 850–3900)
MCH: 31.2 pg (ref 27.0–33.0)
MCHC: 34.3 g/dL (ref 32.0–36.0)
MCV: 91.1 fL (ref 80.0–100.0)
MONO ABS: 498 {cells}/uL (ref 200–950)
MONOS PCT: 6 %
MPV: 9.8 fL (ref 7.5–12.5)
NEUTROS PCT: 72 %
Neutro Abs: 5976 cells/uL (ref 1500–7800)
PLATELETS: 331 10*3/uL (ref 140–400)
RBC: 4.26 MIL/uL (ref 4.20–5.80)
RDW: 13.8 % (ref 11.0–15.0)
WBC: 8.3 10*3/uL (ref 3.8–10.8)

## 2015-12-19 ENCOUNTER — Telehealth: Payer: Self-pay | Admitting: Neurology

## 2015-12-19 LAB — URINALYSIS
BILIRUBIN URINE: NEGATIVE
Glucose, UA: NEGATIVE
HGB URINE DIPSTICK: NEGATIVE
Leukocytes, UA: NEGATIVE
NITRITE: NEGATIVE
PROTEIN: NEGATIVE
SPECIFIC GRAVITY, URINE: 1.015 (ref 1.001–1.035)
pH: 6 (ref 5.0–8.0)

## 2015-12-19 NOTE — Telephone Encounter (Signed)
-----   Message from [place], DO sent at 12/19/2015  7:44 AM EDT ----- Labs looked okay.  Not great candidate for seroquel/nuplazid unless cardiology clears because of QT.  Ask wife if we have tried aricept or exelon.  If not, make appt to discuss

## 2015-12-19 NOTE — Telephone Encounter (Signed)
Wife states patient has not tried these medications. Follow up appt scheduled.

## 2015-12-20 LAB — URINE CULTURE: Organism ID, Bacteria: NO GROWTH

## 2015-12-24 NOTE — Progress Notes (Signed)
Ian [name] was seen today in the movement disorders clinic for f/u.  He is accompanied by his wife who supplements the history.  The first symptom(s) the patient noticed was tremor in the R hand when he would use the hand or have a cup of coffee in it.  His wife believes that this began in 2004.  He was referred to Dr. Tilden Dome.  He tried some medication and according to his wife, he kept having adverse SE.  He was on mirapex and sinemet but reported dizziness and bad dreams with both.   He was referred to Dr. Yevonne Pax in about 2010 because he was interested in DBS but the patient was concerned about risks of the surgery and potential side effects.    09/28/12  I tried to change the levodopa back to the IR formulation but he did not like it because he was too drowsy and went back to the CR formulation.  , 2 po qid and carbidopa/levodopa 50/200 at night.    He has continued to have more tremor after the artane was d/c.  However, this has become less bothersome for him over time.  He does notice that sometimes the medication wears off quickly and sometimes it does not.  It seems inconsistent.  03/06/13 update:  The patient is accompanied by his wife who supplements the history.  Last visit, we tried to add entacapone to each of the 4 carbidopa/levodopa dosing times.  He called me because of nausea and we decreased it to twice a day dosing.  He continued to have nausea and the medication was discontinued.  The patient states that the medication makes his arms feel "heavy." He has stopped the carbidopa/levodopa 50/200 at night as he does not think it was beneficial.  Sometimes, he will wake up in the middle of the night and take an extra of the carbidopa/levodopa CR 25 100.  He and his wife have noted some jerking spells at night.  He sleeps very restless. I did review notes in his chart since last visit.  He went to the emergency room on September 21 with a sensation of palpitations and tachycardia.  His  pacemaker was interrogated and there were no problems and he was subsequently sent home.  06/11/13 update:  This patient is accompanied in the office by his spouse who supplements the history.  Pt is on carbidopa/levodopa 25/100 CR - 2 in the AM and 6 other dosages throughout the day.  When he awakens in the middle of the night he may take a few extra dosages and he estimates he takes a total daily levodopa dose of 1000-1200 mg per day.  He has been very senstive to medication and has not been able to tolerate the immediate release carbidopa/levodopa, entacapone or klonopin (made arms/legs hurt).  Tremor feels like it is getting worse.  Vision is getting worse but he went to the eye doctor and his vision was stable.  He feels like the levodopa only lasts 1.5-2 hrs.  No falls.  Is exercising.    08/13/13 update:   This patient is accompanied in the office by his spouse who supplements the history.  Pt is on carbidopa/levodopa 25/100, and takes approximately 8-10 tablets per day.  Last visit, I also added amantadine to see if that would help the tremor. He does think that has helped.  He presents today to try and change to rytary to see if it would last longer.  He does not wish to  try duopa.  He denies hallucinations, lightheadedness, falls, syncope or dyskinesia.     10/01/13 update:  Last visit, I changed the patient to rytary.  It turned out to be very expensive as the patient had no RX coverage, which I did not realize.  When he changed back, the IR formulation was accidentally called in instead of the CR prepartation (and he didn't tolerate the IR in the past).  The patient also didn't notice that and he isn't even sure now what he is taking but knows he is taking 2 po qid of either the CR or IR.  He is having hallucinations.  He is having swelling in the feet and legs as well.   They saw cardiology last week and were started on lasix and they didn't think that it made a difference.  They are worried  about a discoloration in legs.  He has lost weight.  Had one fall on May 10.  Walking into bedroom, got lightheaded and hit his back on the dresser.  He didn't think that he hit his head at the time but now that they think that he did as he has had hallucinations since.  However, looking back at our notes, it was 5/7 when he called here to d/c the rytary and changed to the IR levodopa it was 5/10 when he fell so timing was likely just coincidental as he had likely just changed back to the IR preparation.  11/12/13 update:  The patient was seen back in followup in the neurology clinic, accompanied by his wife who helps to supplement the history.  Patient has a history of Parkinson's disease.  Last visit, I changed his levodopa back to the CR preparation to see if that would help his hallucinations.  He is currently on 2 tablets by mouth 4 times per day (8am/12/4pm/8pm).  Today, the patient states that hallucinations continue.  He states that he continues to see people.   Medication helps tremor when he first takes it and then the tremor comes back before the next dose.   Last visit, I told him to discontinue his amantadine because of complaints of swelling.  I did review records since our last visit.  Because he noticed no changes in swelling after discontinuation of the amantadine, he followed up with his primary care physician.  His primary care physician felt that the swelling was likely due to venous insufficiency and he recommended that the patient restart the amantadine and he has.  The patient does state that he doesn't think that he had any break between d/c the IR carbidopa/levodopa 25/100, starting the CR version and when he restarted the amantadine.   The patient did see his cardiologist and he saw no cardiac reason for the edema.  He did not want to increase the patient's Lasix further because of complaints of orthostasis and dizziness.  No significant etiology was determined for his weight loss  either.  01/07/14 update:  The patient was seen today in followup in the neurology clinic, accompanied by his wife who helps to supplement the history.  The patient has switched back on his own from the CR version of carbidopa/levodopa to the immediate release form.  He is on 2 po 5 times per day (8am and then every 3 hours).  He is complaining about more tremor and more balance issues.  No falls but near falls.  He is off of amantadine.  Hallucinations went away after it was d/c but tremor definitely picked up.  Leg swelling is better but not gone.  Some memory problems.  No driving since march, 2015.    05/06/14 update:  The patient is seen in f/u, accompanied by his wife who supplements the history.   He is on carbidopa/levodopa 25/100 CR, 2 po 5 times per day (8am and then every 3 hours). He continues to c/o increasing tremor.  States that he is having m. Cramping at nighttime.  His last dose is at 8pm and bedtime is at 10 pm and cramping will start waking him at 3:30 am. Asks about trying to take a combination of one CR and one IR and see how that works.   He states that his memory is "going downhill" although his wife is not quite as sure.  He doesn't drive and hasn't for a year but has not trouble with pills.    08/06/14 update:  The patient is following up today, accompanied by his wife who supplements the history.  Last visit, the patient wanted to try a combination of carbidopa levodopa 25/100 immediate release and extended release so that he was taking 1 pill of each 5 times per day; however he really never did that and is still taking a carbidopa/levodopa 25/100 IR, 2 po 5 times a day.  We did add a carbidopa/levodopa 50/200 at night to see if that would help with his cramping;  The cramping is better at night and he only seems to have it if he has swelling.  His wife mentions that memory is not quite as good as it used to be.  He has some difficulty remembering previous conversations and having word  finding difficulties.  It is not a major issue, but it is something she is noticing.  Much has happened in his other medical history since last visit.  I reviewed those records.  The patient had a nuclear stress test on 06/13/2014, demonstrating a high risk study with a large area of ischemia in the LAD artery distribution and a fixed defect in the inferior wall.  An echocardiogram demonstrated a left ventricular ejection fraction of approximately 20%, which was much decreased compared to his prior study of 50-55%.  A heart catheterization was subsequently scheduled and performed on 06/20/2014.  There was no evidence of ischemia to account for the dropped ejection fraction.  It was felt that he was likely not a candidate for an ICD and they talked about CRT and decided to hold on that.  They saw Dr. Rayann Heman yesterday.  12/03/14 update:  The patient presents today, accompanied by his wife who supplements the history.  The records that were made available to me were reviewed.  Last visit, we talked about our limited options, and ultimately decided to try a combination of carbidopa/levodopa 25/100 IR and carbidopa/levodopa 25/100 CR, one tablet each of these 5 times per day.  There was some misunderstanding and he ended up taking carbidopa/levodopa 25/100 IR in addition to carbidopa/levodopa 50/200 CR, 5 times per day.  He was then taking the carbidopa/levodopa 50/200 at night as well.  Overall, this helped the tremor but hallucinations picked up and therefore he went back to carbidopa/levodopa 25/100 IR, 2 po 5 times a day.  He is having more difficulty opening his hands in the morning.  Pts biggest c/o is vision change and he thinks that it is from his PD meds.  Been to [facility] in November and I reviewed those records and they said that they thought that it was primarily surface related  but didn't think that it was significant.  He tried eye drops for dry eye and nothing helped.  He asks me multiple times what else he  can do.  His wife also asks me about a referral for a consultation regarding focused ultrasound.  04/04/15 update:  The patient is following up today, accompanied by his wife who supplements the history.  He has a history of Parkinson's disease.  He is on carbidopa/levodopa 25/100, 2 tablets 5 times per day and he went back on carbidopa/levodopa 50/200 qhs.  Last visit, he would not levodopa was affecting his vision, although I did not.  Regardless, we decided to stop the medication for a few days.  He was not able to tolerate being off the medication because tremor was so bad and ended up restarting it fairly quickly after stopping it.  Tremor continues to be bothersome and asks if there is anything else he can do.  Depression is getting worse because of that.  No suicidal or homicidal ideation.  He is doing yoga with his wife and enjoys that.  He has had no falls but has had some near falls.  I did refer him to Dr. Sanda Klein regarding his vision change and he is going in Jan (pt moved appt back).  His wife had also requested a referral last visit to [facility] to possible focused ultrasound, but ultimately they decided that they did not want to go to the appointment.  I think that is probably a good decision as focused u/s is not indicated yet for Parkinsons disease.  They do bring in a brochure today about duopa and ask me about that today.  Also saw an article in paper about study Dr. Jimmey Ralph is doing at [facility] and he called the number.  He showed me the article today and it was for patients who have had little weak body dementia and Parkinson's dementia with hallucinations.  He has begun to have a few more hallucinations since last visit, but reports that he knows that they are not real.  07/10/15 update:  The patient is following up today, accompanied by his wife who supplements the history.  He has a history of Parkinson's disease.  He is on carbidopa/levodopa 25/100, 2 tablets 5 times per day (8/11/2/5/8) in  addition to carbidopa/levodopa 50/200 at night (10pm). He thinks that the medication wears off before next visit and asks about taking the 50/200 in addition to one 25/100.  I reminded him that he accidentally tried that last august but he had more hallucinations.  He states that he just tried that recently with the 25/100 just like he did last august and didn't have the hallucinations.  He did try Neupro since our last visit and worked up to 4 mg.  He really did not find this beneficial.  He did not have side effects with that.  It did not make hallucinations worse, and in fact he thought they were potentially even better.  He did not want to go up to 6 mg and ended up discontinuing the medication.  He has not had any falls since last visit but does feel that he has been more unstable.  Sometimes will yell out at night but seems infrequent.  No hallucinations or near syncope.  I did review records since last visit.  He was seen back at [facility] and ended up having eye surgery for lower eyelid retraction bilaterally on March 1.    11/24/15 update:  The patient is following  up today, accompanied by his wife who supplements the history.  After our last visit, the patient was supposed to be on carbidopa/levodopa 50/200 CR 5 times per day and no more than carbidopa/levodopa 25/100 3 times per day.  He called me quite some time after last visit to state that he had gone up on the carbidopa/levodopa 50/200, to 6 times per day and carbidopa/levodopa 25/100, 5 times per day and subsequently hallucinations had become very prominent.  I explained that unfortunately, this is why I did not want him to go up on the medication, as he was not a Nuplazid candidate because of the fact that he already had QT prolongation.  He went back to taking them as he was supposed to but he is still having some hallucinations.  He recognizes that they aren't real.  No falls since our last visit.  He is exercising some but gets tired quickly.   They asked me about xadago.   On July 13, he did undergo surgical placement of a new pacemaker.  He didn't notice a big difference in stamina with the new PPM but he has been able to breathe better.    12/26/15 update:  Pt returns for f/u, accompanied by his wife who supplements the history.  He is still on carbidopa/levodopa 25/100 3 times a day and carbidopa/levodopa 50/200 CR five times a day.  Pt tells me he is actually taking carbidopa/levodopa 50/200, 2 po 5 times a day but wife doesn't think he is doing it that way.  She isn't sure but has realized she needs to manage meds.  He will sometimes get up in the middle of the night and take an extra carbidopa/levodopa 25/100 and an extra 50/200.  Last visit the patient really wanted to try xadago because he had a friend on it who was doing great.  I was not convinced it would be that helpful and initially he decided to not try it because of this but ultimately did try it.  Pts wife noted confusion in the patient and she initially thought that it was the medication (although she had noted confusion prior to that and it was documented in a phone encounter).  The medication was d/c.  He has begun to have paranoia and has heard noises in the house in addition to visual hallucinations.  Has called his wife a liar when she tells him that no one is there.  Wife states that confusion continues.  He has asked his wife "how long he will live in this house" when they have owned that house for years.  Wife states that hallucinations have become more "complex."  Had UA that was neg (except dehydration).  Urine cx was negative.  Wife also notes more unsteady on feet  Neuroimaging has previously been performed.  It is not available for my review today.  PREVIOUS MEDICATIONS: Sinemet (reported bad dreams/dizziness on regular formulation and same with Mirapex); requip;  on Sinemet CR currently, entacapone (felt dizzy and arms felt "heavy"); klonopin (made arms/legs hurt);  artane; amantadine (hallucinations); rytary (costly and pt has no RX coverage); neupro  ALLERGIES:   Allergies  Allergen Reactions  . Plavix [Clopidogrel]     Fatigue   . Pravastatin     Muscle aches  . Zocor [Simvastatin]     REACTION: dizziness  . Penicillins Rash    Has patient had a PCN reaction causing immediate rash, facial/tongue/throat swelling, SOB or lightheadedness with hypotension: YES Has patient had a PCN reaction  causing severe rash involving mucus membranes or skin necrosis: NO Has patient had a PCN reaction that required hospitalization NO Has patient had a PCN reaction occurring within the last 10 years: NO If all of the above answers are "NO", then may proceed with Cephalosporin use.    CURRENT MEDICATIONS:  Current Outpatient Prescriptions on File Prior to Visit  Medication Sig Dispense Refill  . aspirin 81 MG tablet Take 81 mg by mouth daily.    . bisoprolol-hydrochlorothiazide (ZIAC) 5-6.25 MG tablet TAKE 1 TABLET DAILY 90 tablet 3  . carbidopa-levodopa (SINEMET CR) 50-200 MG tablet Take 1 tablet by mouth 5 (five) times daily. 450 tablet 1  . carbidopa-levodopa (SINEMET IR) 25-100 MG tablet Take 1 tablet by mouth 3 (three) times daily. 270 tablet 1  . furosemide (LASIX) 80 MG tablet Take 1 tablet (80 mg total) by mouth daily. 90 tablet 3  . lansoprazole (PREVACID) 15 MG capsule Take 15 mg by mouth daily as needed (Approx 2 times weekly). gerd    . Polyethyl Glycol-Propyl Glycol (SYSTANE ULTRA OP) Place 1 drop into both eyes daily.    . polyethylene glycol (MIRALAX / GLYCOLAX) packet Take 17 g by mouth daily.    . potassium chloride (K-DUR,KLOR-CON) 10 MEQ tablet Take 10 mEq by mouth daily.     . vitamin B-12 (CYANOCOBALAMIN) 1000 MCG tablet Take 1,000 mcg by mouth daily.     Current Facility-Administered Medications on File Prior to Visit  Medication Dose Route Frequency Provider Last Rate Last Dose  . DOBUTamine (DOBUTREX) 1,000 mcg/mL in dextrose 5% 250 mL  infusion  5-20 mcg/kg/min (Order-Specific) Intravenous Continuous Carlena Bjornstad, MD 40.8 mL/hr at 09/20/14 1415 10 mcg/kg/min at 09/20/14 1415    PAST MEDICAL HISTORY:   Past Medical History:  Diagnosis Date  . Allergy   . Aortic valve stenosis, acquired    a. s/p Porcine AVR 2001;  b. 09/2014 [place]ut Echo: mod-sev bioprosthetic AS w/ minimal change in CO and only mild increase in gradients w/ dobutamine.   Marland Kitchen CAD (coronary artery disease)    a. s/p CABG with LIMA-LAD in 2001 with AVR. b. BMS to LCx 8/08. c. Abnl nuc/dec EF 05/2014 - s/p cath with patent LIMA to LAD, patent dominant right, mild LCx disease, patent stent. Med rx.  . Chronic combined systolic (congestive) and diastolic (congestive) heart failure (HCC)    a. EF 50-55% in 02/2013;  b. 05/2014 Echo: EF 20-25%, Gr1 DD.  Marland Kitchen Diverticulitis   . ED (erectile dysfunction)   . Essential hypertension   . GERD (gastroesophageal reflux disease)   . Hyperlipidemia   . MALT lymphoma ([place])   . Nonischemic cardiomyopathy ([place])    a. 05/2014 Echo: EF 20-25%-->f/u cath w/ nonobs dzs.  . Osteoarthritis   . Osteoporosis   . Parkinson's disease   . Renal artery stenosis (HCC)    a. 90% RRA stenosis previously. b. Duplex 09/2014: R renal artery 60-99%, L renal artery 1-59%.  . S/P cardiac pacemaker procedure, Medtronic Adapta L  ADDRL1, 01/21/12    a. 01/2012 s/p MDT [place] PPM (ser # [id number]).  . Symptomatic bradycardia    a. 01/2012 s/p MDT [place] PPM (ser # [id number]).  Marland Kitchen TIA (transient ischemic attack) 06/08   a. 09/2006    PAST SURGICAL HISTORY:   Past Surgical History:  Procedure Laterality Date  . AORTIC VALVE REPLACEMENT  2001   Porcine valve  . CARDIAC CATHETERIZATION  06/20/2014   Procedure: CORONARY/GRAFT  ANGIOGRAPHY;  Surgeon: Lorretta Harp, MD;  Location: [facility] CATH LAB;  Service: Cardiovascular;;  . CATARACT EXTRACTION  06/09   left  . CORONARY ARTERY BYPASS GRAFT  2001   ( van trigt ) aortic valve  replacement 2001; LIMA-LAD.  Marland Kitchen CORONARY STENT PLACEMENT  11/2007   8/09  Left Circumflex Stent by Dr Toy Care started and aggrenox stopped  . EP IMPLANTABLE DEVICE N/A 10/30/2015   Procedure: BiV Pacemaker Upgrade;  Surgeon: Thompson Grayer, MD;  Location: [facility] CV LAB;  Service: Cardiovascular;  Laterality: N/A;  . ESOPHAGOGASTRODUODENOSCOPY     multiple, Colon/ EGD benign 11/2004  . EYE SURGERY  06/18/15  . INGUINAL HERNIA REPAIR     LIH 1997Llap. bilateral hernias 1998  . LAPAROSCOPIC CHOLECYSTECTOMY  07/11   Dr.Rosenbower  . NM MYOCAR PERF WALL MOTION  11/22/2007   mild septal ischemia,EF 66%  . PACEMAKER INSERTION  01/21/2012   Medtronic Adapta L implanted for complete heart block  . PERMANENT PACEMAKER INSERTION N/A 01/21/2012   Procedure: PERMANENT PACEMAKER INSERTION;  Surgeon: Thompson Grayer, MD;  Location: [facility] CATH LAB;  Service: Cardiovascular;  Laterality: N/A;  . PILONIDAL CYST / SINUS EXCISION  1953  . SQUAMOUS CELL CARCINOMA EXCISION  3/13   back    SOCIAL HISTORY:   Social History   Social History  . Marital status: Married    Spouse name: N/A  . Number of children: 3  . Years of education: N/A   Occupational History  . retired- [employer]   . part-time sub/courier for schools- now only rarely    Social History Main Topics  . Smoking status: Former Smoker    Packs/day: 1.00    Years: 5.00    Types: Cigarettes    Quit date: 06/06/1963  . Smokeless tobacco: Never Used  . Alcohol use No     Comment: very rare  . Drug use: No  . Sexual activity: No   Other Topics Concern  . Not on file   Social History Narrative   Has living will   Wife, then son [name], hold health care POA   Requests DNR --done   Requests no tube feeds if cognitively unaware    FAMILY HISTORY:   Family Status  Relation Status  . Mother Deceased at age 26   old age  . Father Deceased at age 28   acute bronchitis, cotton exposure  . Sister Deceased   trauma  .  Brother Deceased   2, deceased  . Daughter Deceased at age 13   osteosarcoma  . Child Alive   3, healthy  . Brother Alive   healthy  . Sister Alive   healthy    ROS:    A complete 10 system review of systems was obtained and was unremarkable apart from what is mentioned above.  PHYSICAL EXAMINATION:    VITALS:   Vitals:   12/26/15 0950  BP: 128/64  BP Location: Right Arm  Patient Position: Sitting  Cuff Size: Normal  Pulse: 74  Weight: 145 lb (65.8 kg)  Height: 5\' 6"  (1.676 m)   Wt Readings from Last 3 Encounters:  12/26/15 145 lb (65.8 kg)  11/24/15 149 lb (67.6 kg)  10/30/15 145 lb (65.8 kg)     GEN:  The patient appears stated age and is in NAD.   He is a bit tearful when talking about cardiac status. HEENT:  Normocephalic, atraumatic.  The mucous membranes are moist. The superficial temporal arteries  are without ropiness or tenderness. CV:  RRR Lungs:  CTAB Neck/HEME:  There are no carotid bruits bilaterally.  Neurological examination:  Orientation: The patient is alert and oriented x3.  He is a very good historian today. Montreal Cognitive Assessment  12/26/2015 12/03/2014  Visuospatial/ Executive (0/5) 0 2  Naming (0/3) 3 3  Attention: Read list of digits (0/2) 2 1  Attention: Read list of letters (0/1) 1 0  Attention: Serial 7 subtraction starting at 100 (0/3) 1 2  Language: Repeat phrase (0/2) 2 2  Language : Fluency (0/1) 0 0  Abstraction (0/2) 2 1  Delayed Recall (0/5) 1 2  Orientation (0/6) 4 5  Total 16 18  Adjusted Score (based on education) 17 18    Movement examination: Tone: There is normal tone in the bilateral upper extremities today.  The tone in the lower extremities is normal.  Abnormal movements: There is a moderate resting tremor bilaterally, R>L.  No dyskinesia. Coordination:  There is no decremation, with any form of RAMS, including alternating supination and pronation of the forearm, hand opening and closing, finger taps, heel taps  and toe taps. Gait and Station: The patient has mild difficulty arising out of a deep-seated chair without the use of the hands. The patient's stride length is normal.  He has mild postural instability.    Lab Results  Component Value Date   WBC 8.3 12/18/2015   HGB 13.3 12/18/2015   HCT 38.8 12/18/2015   MCV 91.1 12/18/2015   PLT 331 12/18/2015     Chemistry      Component Value Date/Time   NA 142 12/18/2015 1045   K 4.5 12/18/2015 1045   CL 102 12/18/2015 1045   CO2 29 12/18/2015 1045   BUN 14 12/18/2015 1045   CREATININE 0.94 12/18/2015 1045      Component Value Date/Time   CALCIUM 9.3 12/18/2015 1045   ALKPHOS 64 12/18/2015 1045   AST 13 12/18/2015 1045   ALT <3 (L) 12/18/2015 1045   BILITOT 0.5 12/18/2015 1045     Lab Results  Component Value Date   VITAMINB12 660 03/06/2013   Lab Results  Component Value Date   TSH 4.349 06/17/2014     ASSESSMENT/PLAN:  1.  Idiopathic Parkinsons disease.    -Long discussion again with the patient and his wife.  Unfortunately, he really is in a position where we cannot go up further on the levodopa because of hallucinations and he is not a candidate for Nuplazid/seroquel because of prolonged QT.  He is not a DBS candidate because of multiple other medical problems.  He will take carbidopa/levodopa 50/200 CR, 5 times per day and carbidopa/levodopa 25/100 IR, up to 3times per day.  I wonder if part of the issue with increasing hallucinations, confusion is because he may be taking more medication that he is supposed to.  Wife to start managing medication.  If that doesn't help, I think that we could drop levodopa dosage.  Will reassess in a week after wife takes over medication 2.  Parkinson disease dementia  -I think this is from long-standing Parkinson's disease.  We have talked about the acetylcholinesterase inhibitors in the past but he was not interested in [employer] because of risk of nausea and had refused Aricept because he didn't  want more medication.  Unfortunately, confusion, hallucinations and paranoia have increased.  I would like to see him on nuplazid but his QT interval makes this less than desirable.  Told them today  that if hallucinations/paranoia not better then we may have to drop levodopa and/or try aricept.  May need to sacrifice tremor control to get better handle on hallucinations.  -doesn't drive.  Discussed home safety at length. 3.  B12 deficiency.  -He is on oral supplements  4.  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  -He could not tolerate klonopin.  Is overall mild and talked about home safety 5.  I. will see him back in the next 1.5 weeks.  Much greater than 50% of this visit was spent in counseling and coordinating care.  Total face to face time:  40 min

## 2015-12-25 ENCOUNTER — Telehealth: Payer: Self-pay

## 2015-12-25 ENCOUNTER — Ambulatory Visit (INDEPENDENT_AMBULATORY_CARE_PROVIDER_SITE_OTHER): Payer: Medicare Other

## 2015-12-25 DIAGNOSIS — Z950 Presence of cardiac pacemaker: Secondary | ICD-10-CM

## 2015-12-25 DIAGNOSIS — I5022 Chronic systolic (congestive) heart failure: Secondary | ICD-10-CM | POA: Diagnosis not present

## 2015-12-25 NOTE — Telephone Encounter (Signed)
Remote ICM transmission received.  Attempted patient call and left message for return call.   

## 2015-12-25 NOTE — Progress Notes (Signed)
EPIC Encounter for ICM Monitoring  Patient Name: Ian [name] is a 82 y.o. male Date: 12/25/2015 Primary Care Physican: Viviana Simpler, MD Primary Cardiologist: Croitoru Electrophysiologist: Allred Dry Weight:  unknown Bi-V Pacing:  97%       Attempted ICM call and unable to reach.  Transmission reviewed.   Thoracic impedance abnormal suggesting fluid accumulation 12/10/2015 to 12/21/2015 and return to normal 12/21/2015.  Follow-up plan: ICM clinic phone appointment on 01/28/2016.  Copy of ICM check sent to device physician.   ICM trend: 12/25/2015       Rosalene Billings, RN 12/25/2015 2:49 PM

## 2015-12-26 ENCOUNTER — Ambulatory Visit (INDEPENDENT_AMBULATORY_CARE_PROVIDER_SITE_OTHER): Payer: Medicare Other | Admitting: Neurology

## 2015-12-26 ENCOUNTER — Encounter: Payer: Self-pay | Admitting: Neurology

## 2015-12-26 VITALS — BP 128/64 | HR 74 | Ht 66.0 in | Wt 145.0 lb

## 2015-12-26 DIAGNOSIS — I701 Atherosclerosis of renal artery: Secondary | ICD-10-CM

## 2015-12-26 DIAGNOSIS — G20 Parkinson's disease: Secondary | ICD-10-CM

## 2015-12-26 DIAGNOSIS — F0280 Dementia in other diseases classified elsewhere without behavioral disturbance: Secondary | ICD-10-CM | POA: Diagnosis not present

## 2015-12-26 DIAGNOSIS — R441 Visual hallucinations: Secondary | ICD-10-CM

## 2015-12-26 DIAGNOSIS — R9431 Abnormal electrocardiogram [ECG] [EKG]: Secondary | ICD-10-CM

## 2015-12-26 DIAGNOSIS — I4581 Long QT syndrome: Secondary | ICD-10-CM

## 2015-12-26 DIAGNOSIS — G20A1 Parkinson's disease without dyskinesia, without mention of fluctuations: Secondary | ICD-10-CM

## 2015-12-26 NOTE — Progress Notes (Signed)
Attempted return call after receiving voice mail that wife was returning my call.  No answer and left message

## 2015-12-26 NOTE — Progress Notes (Signed)
Wife returned call.  Reviewed transmission.  She stated he had some ankle swelling during the time of decreased impedance but is now resolved.  She stated he is doing well.  No changes in meds.  [place] beach [place].  Advised to take monitor with them if possible since they will be gone for a month and she stated she would take it.  No changes today.

## 2016-01-04 NOTE — Progress Notes (Deleted)
Ian [name] was seen today in the movement disorders clinic for f/u.  He is accompanied by his wife who supplements the history.  The first symptom(s) the patient noticed was tremor in the R hand when he would use the hand or have a cup of coffee in it.  His wife believes that this began in 2004.  He was referred to Dr. Tilden Dome.  He tried some medication and according to his wife, he kept having adverse SE.  He was on mirapex and sinemet but reported dizziness and bad dreams with both.   He was referred to Dr. Yevonne Pax in about 2010 because he was interested in DBS but the patient was concerned about risks of the surgery and potential side effects.    09/28/12  I tried to change the levodopa back to the IR formulation but he did not like it because he was too drowsy and went back to the CR formulation.  , 2 po qid and carbidopa/levodopa 50/200 at night.    He has continued to have more tremor after the artane was d/c.  However, this has become less bothersome for him over time.  He does notice that sometimes the medication wears off quickly and sometimes it does not.  It seems inconsistent.  03/06/13 update:  The patient is accompanied by his wife who supplements the history.  Last visit, we tried to add entacapone to each of the 4 carbidopa/levodopa dosing times.  He called me because of nausea and we decreased it to twice a day dosing.  He continued to have nausea and the medication was discontinued.  The patient states that the medication makes his arms feel "heavy." He has stopped the carbidopa/levodopa 50/200 at night as he does not think it was beneficial.  Sometimes, he will wake up in the middle of the night and take an extra of the carbidopa/levodopa CR 25 100.  He and his wife have noted some jerking spells at night.  He sleeps very restless. I did review notes in his chart since last visit.  He went to the emergency room on September 21 with a sensation of palpitations and tachycardia.  His  pacemaker was interrogated and there were no problems and he was subsequently sent home.  06/11/13 update:  This patient is accompanied in the office by his spouse who supplements the history.  Pt is on carbidopa/levodopa 25/100 CR - 2 in the AM and 6 other dosages throughout the day.  When he awakens in the middle of the night he may take a few extra dosages and he estimates he takes a total daily levodopa dose of 1000-1200 mg per day.  He has been very senstive to medication and has not been able to tolerate the immediate release carbidopa/levodopa, entacapone or klonopin (made arms/legs hurt).  Tremor feels like it is getting worse.  Vision is getting worse but he went to the eye doctor and his vision was stable.  He feels like the levodopa only lasts 1.5-2 hrs.  No falls.  Is exercising.    08/13/13 update:   This patient is accompanied in the office by his spouse who supplements the history.  Pt is on carbidopa/levodopa 25/100, and takes approximately 8-10 tablets per day.  Last visit, I also added amantadine to see if that would help the tremor. He does think that has helped.  He presents today to try and change to rytary to see if it would last longer.  He does not wish to  try duopa.  He denies hallucinations, lightheadedness, falls, syncope or dyskinesia.     10/01/13 update:  Last visit, I changed the patient to rytary.  It turned out to be very expensive as the patient had no RX coverage, which I did not realize.  When he changed back, the IR formulation was accidentally called in instead of the CR prepartation (and he didn't tolerate the IR in the past).  The patient also didn't notice that and he isn't even sure now what he is taking but knows he is taking 2 po qid of either the CR or IR.  He is having hallucinations.  He is having swelling in the feet and legs as well.   They saw cardiology last week and were started on lasix and they didn't think that it made a difference.  They are worried  about a discoloration in legs.  He has lost weight.  Had one fall on May 10.  Walking into bedroom, got lightheaded and hit his back on the dresser.  He didn't think that he hit his head at the time but now that they think that he did as he has had hallucinations since.  However, looking back at our notes, it was 5/7 when he called here to d/c the rytary and changed to the IR levodopa it was 5/10 when he fell so timing was likely just coincidental as he had likely just changed back to the IR preparation.  11/12/13 update:  The patient was seen back in followup in the neurology clinic, accompanied by his wife who helps to supplement the history.  Patient has a history of Parkinson's disease.  Last visit, I changed his levodopa back to the CR preparation to see if that would help his hallucinations.  He is currently on 2 tablets by mouth 4 times per day (8am/12/4pm/8pm).  Today, the patient states that hallucinations continue.  He states that he continues to see people.   Medication helps tremor when he first takes it and then the tremor comes back before the next dose.   Last visit, I told him to discontinue his amantadine because of complaints of swelling.  I did review records since our last visit.  Because he noticed no changes in swelling after discontinuation of the amantadine, he followed up with his primary care physician.  His primary care physician felt that the swelling was likely due to venous insufficiency and he recommended that the patient restart the amantadine and he has.  The patient does state that he doesn't think that he had any break between d/c the IR carbidopa/levodopa 25/100, starting the CR version and when he restarted the amantadine.   The patient did see his cardiologist and he saw no cardiac reason for the edema.  He did not want to increase the patient's Lasix further because of complaints of orthostasis and dizziness.  No significant etiology was determined for his weight loss  either.  01/07/14 update:  The patient was seen today in followup in the neurology clinic, accompanied by his wife who helps to supplement the history.  The patient has switched back on his own from the CR version of carbidopa/levodopa to the immediate release form.  He is on 2 po 5 times per day (8am and then every 3 hours).  He is complaining about more tremor and more balance issues.  No falls but near falls.  He is off of amantadine.  Hallucinations went away after it was d/c but tremor definitely picked up.  Leg swelling is better but not gone.  Some memory problems.  No driving since march, 2015.    05/06/14 update:  The patient is seen in f/u, accompanied by his wife who supplements the history.   He is on carbidopa/levodopa 25/100 CR, 2 po 5 times per day (8am and then every 3 hours). He continues to c/o increasing tremor.  States that he is having m. Cramping at nighttime.  His last dose is at 8pm and bedtime is at 10 pm and cramping will start waking him at 3:30 am. Asks about trying to take a combination of one CR and one IR and see how that works.   He states that his memory is "going downhill" although his wife is not quite as sure.  He doesn't drive and hasn't for a year but has not trouble with pills.    08/06/14 update:  The patient is following up today, accompanied by his wife who supplements the history.  Last visit, the patient wanted to try a combination of carbidopa levodopa 25/100 immediate release and extended release so that he was taking 1 pill of each 5 times per day; however he really never did that and is still taking a carbidopa/levodopa 25/100 IR, 2 po 5 times a day.  We did add a carbidopa/levodopa 50/200 at night to see if that would help with his cramping;  The cramping is better at night and he only seems to have it if he has swelling.  His wife mentions that memory is not quite as good as it used to be.  He has some difficulty remembering previous conversations and having word  finding difficulties.  It is not a major issue, but it is something she is noticing.  Much has happened in his other medical history since last visit.  I reviewed those records.  The patient had a nuclear stress test on 06/13/2014, demonstrating a high risk study with a large area of ischemia in the LAD artery distribution and a fixed defect in the inferior wall.  An echocardiogram demonstrated a left ventricular ejection fraction of approximately 20%, which was much decreased compared to his prior study of 50-55%.  A heart catheterization was subsequently scheduled and performed on 06/20/2014.  There was no evidence of ischemia to account for the dropped ejection fraction.  It was felt that he was likely not a candidate for an ICD and they talked about CRT and decided to hold on that.  They saw Dr. Rayann Heman yesterday.  12/03/14 update:  The patient presents today, accompanied by his wife who supplements the history.  The records that were made available to me were reviewed.  Last visit, we talked about our limited options, and ultimately decided to try a combination of carbidopa/levodopa 25/100 IR and carbidopa/levodopa 25/100 CR, one tablet each of these 5 times per day.  There was some misunderstanding and he ended up taking carbidopa/levodopa 25/100 IR in addition to carbidopa/levodopa 50/200 CR, 5 times per day.  He was then taking the carbidopa/levodopa 50/200 at night as well.  Overall, this helped the tremor but hallucinations picked up and therefore he went back to carbidopa/levodopa 25/100 IR, 2 po 5 times a day.  He is having more difficulty opening his hands in the morning.  Pts biggest c/o is vision change and he thinks that it is from his PD meds.  Been to [facility] in November and I reviewed those records and they said that they thought that it was primarily surface related  but didn't think that it was significant.  He tried eye drops for dry eye and nothing helped.  He asks me multiple times what else he  can do.  His wife also asks me about a referral for a consultation regarding focused ultrasound.  04/04/15 update:  The patient is following up today, accompanied by his wife who supplements the history.  He has a history of Parkinson's disease.  He is on carbidopa/levodopa 25/100, 2 tablets 5 times per day and he went back on carbidopa/levodopa 50/200 qhs.  Last visit, he would not levodopa was affecting his vision, although I did not.  Regardless, we decided to stop the medication for a few days.  He was not able to tolerate being off the medication because tremor was so bad and ended up restarting it fairly quickly after stopping it.  Tremor continues to be bothersome and asks if there is anything else he can do.  Depression is getting worse because of that.  No suicidal or homicidal ideation.  He is doing yoga with his wife and enjoys that.  He has had no falls but has had some near falls.  I did refer him to Dr. Sanda Klein regarding his vision change and he is going in Jan (pt moved appt back).  His wife had also requested a referral last visit to [facility] to possible focused ultrasound, but ultimately they decided that they did not want to go to the appointment.  I think that is probably a good decision as focused u/s is not indicated yet for Parkinsons disease.  They do bring in a brochure today about duopa and ask me about that today.  Also saw an article in paper about study Dr. Jimmey Ralph is doing at [facility] and he called the number.  He showed me the article today and it was for patients who have had little weak body dementia and Parkinson's dementia with hallucinations.  He has begun to have a few more hallucinations since last visit, but reports that he knows that they are not real.  07/10/15 update:  The patient is following up today, accompanied by his wife who supplements the history.  He has a history of Parkinson's disease.  He is on carbidopa/levodopa 25/100, 2 tablets 5 times per day (8/11/2/5/8) in  addition to carbidopa/levodopa 50/200 at night (10pm). He thinks that the medication wears off before next visit and asks about taking the 50/200 in addition to one 25/100.  I reminded him that he accidentally tried that last august but he had more hallucinations.  He states that he just tried that recently with the 25/100 just like he did last august and didn't have the hallucinations.  He did try Neupro since our last visit and worked up to 4 mg.  He really did not find this beneficial.  He did not have side effects with that.  It did not make hallucinations worse, and in fact he thought they were potentially even better.  He did not want to go up to 6 mg and ended up discontinuing the medication.  He has not had any falls since last visit but does feel that he has been more unstable.  Sometimes will yell out at night but seems infrequent.  No hallucinations or near syncope.  I did review records since last visit.  He was seen back at [facility] and ended up having eye surgery for lower eyelid retraction bilaterally on March 1.    11/24/15 update:  The patient is following  up today, accompanied by his wife who supplements the history.  After our last visit, the patient was supposed to be on carbidopa/levodopa 50/200 CR 5 times per day and no more than carbidopa/levodopa 25/100 3 times per day.  He called me quite some time after last visit to state that he had gone up on the carbidopa/levodopa 50/200, to 6 times per day and carbidopa/levodopa 25/100, 5 times per day and subsequently hallucinations had become very prominent.  I explained that unfortunately, this is why I did not want him to go up on the medication, as he was not a Nuplazid candidate because of the fact that he already had QT prolongation.  He went back to taking them as he was supposed to but he is still having some hallucinations.  He recognizes that they aren't real.  No falls since our last visit.  He is exercising some but gets tired quickly.   They asked me about xadago.   On July 13, he did undergo surgical placement of a new pacemaker.  He didn't notice a big difference in stamina with the new PPM but he has been able to breathe better.    12/26/15 update:  Pt returns for f/u, accompanied by his wife who supplements the history.  He is still on carbidopa/levodopa 25/100 3 times a day and carbidopa/levodopa 50/200 CR five times a day.  Pt tells me he is actually taking carbidopa/levodopa 50/200, 2 po 5 times a day but wife doesn't think he is doing it that way.  She isn't sure but has realized she needs to manage meds.  He will sometimes get up in the middle of the night and take an extra carbidopa/levodopa 25/100 and an extra 50/200.  Last visit the patient really wanted to try xadago because he had a friend on it who was doing great.  I was not convinced it would be that helpful and initially he decided to not try it because of this but ultimately did try it.  Pts wife noted confusion in the patient and she initially thought that it was the medication (although she had noted confusion prior to that and it was documented in a phone encounter).  The medication was d/c.  He has begun to have paranoia and has heard noises in the house in addition to visual hallucinations.  Has called his wife a liar when she tells him that no one is there.  Wife states that confusion continues.  He has asked his wife "how long he will live in this house" when they have owned that house for years.  Wife states that hallucinations have become more "complex."  Had UA that was neg (except dehydration).  Urine cx was negative.  Wife also notes more unsteady on feet  01/06/16 update:  Pt here for f/u, accompanied by his wife who supplements the history.  I asked his wife to take over direct management/visualization of his medication to make sure that meds were taken correctly.  She is now sure that he is taking carbidopa/levodopa 25/100 tid and carbidopa/levodopa 50/200 five  times a day.  Hallucinations/paranoia over the last week and a half since doing this have been ***.    Neuroimaging has previously been performed.  It is not available for my review today.  PREVIOUS MEDICATIONS: Sinemet (reported bad dreams/dizziness on regular formulation and same with Mirapex); requip;  on Sinemet CR currently, entacapone (felt dizzy and arms felt "heavy"); klonopin (made arms/legs hurt); artane; amantadine (hallucinations); rytary (costly and  pt has no RX coverage); neupro  ALLERGIES:   Allergies  Allergen Reactions  . Plavix [Clopidogrel]     Fatigue   . Pravastatin     Muscle aches  . Zocor [Simvastatin]     REACTION: dizziness  . Penicillins Rash    Has patient had a PCN reaction causing immediate rash, facial/tongue/throat swelling, SOB or lightheadedness with hypotension: YES Has patient had a PCN reaction causing severe rash involving mucus membranes or skin necrosis: NO Has patient had a PCN reaction that required hospitalization NO Has patient had a PCN reaction occurring within the last 10 years: NO If all of the above answers are "NO", then may proceed with Cephalosporin use.    CURRENT MEDICATIONS:  Current Outpatient Prescriptions on File Prior to Visit  Medication Sig Dispense Refill  . aspirin 81 MG tablet Take 81 mg by mouth daily.    . bisoprolol-hydrochlorothiazide (ZIAC) 5-6.25 MG tablet TAKE 1 TABLET DAILY 90 tablet 3  . carbidopa-levodopa (SINEMET CR) 50-200 MG tablet Take 1 tablet by mouth 5 (five) times daily. 450 tablet 1  . carbidopa-levodopa (SINEMET IR) 25-100 MG tablet Take 1 tablet by mouth 3 (three) times daily. 270 tablet 1  . furosemide (LASIX) 80 MG tablet Take 1 tablet (80 mg total) by mouth daily. 90 tablet 3  . lansoprazole (PREVACID) 15 MG capsule Take 15 mg by mouth daily as needed (Approx 2 times weekly). gerd    . Polyethyl Glycol-Propyl Glycol (SYSTANE ULTRA OP) Place 1 drop into both eyes daily.    . polyethylene glycol  (MIRALAX / GLYCOLAX) packet Take 17 g by mouth daily.    . potassium chloride (K-DUR,KLOR-CON) 10 MEQ tablet Take 10 mEq by mouth daily.     . vitamin B-12 (CYANOCOBALAMIN) 1000 MCG tablet Take 1,000 mcg by mouth daily.     Current Facility-Administered Medications on File Prior to Visit  Medication Dose Route Frequency Provider Last Rate Last Dose  . DOBUTamine (DOBUTREX) 1,000 mcg/mL in dextrose 5% 250 mL infusion  5-20 mcg/kg/min (Order-Specific) Intravenous Continuous Carlena Bjornstad, MD   10 mcg/kg/min at 09/20/14 1415    PAST MEDICAL HISTORY:   Past Medical History:  Diagnosis Date  . Allergy   . Aortic valve stenosis, acquired    a. s/p Porcine AVR 2001;  b. 09/2014 Dobut Echo: mod-sev bioprosthetic AS w/ minimal change in CO and only mild increase in gradients w/ dobutamine.   Marland Kitchen CAD (coronary artery disease)    a. s/p CABG with LIMA-LAD in 2001 with AVR. b. BMS to LCx 8/08. c. Abnl nuc/dec EF 05/2014 - s/p cath with patent LIMA to LAD, patent dominant right, mild LCx disease, patent stent. Med rx.  . Chronic combined systolic (congestive) and diastolic (congestive) heart failure (HCC)    a. EF 50-55% in 02/2013;  b. 05/2014 Echo: EF 20-25%, Gr1 DD.  Marland Kitchen Diverticulitis   . ED (erectile dysfunction)   . Essential hypertension   . GERD (gastroesophageal reflux disease)   . Hyperlipidemia   . MALT lymphoma ([place])   . Nonischemic cardiomyopathy ([place])    a. 05/2014 Echo: EF 20-25%-->f/u cath w/ nonobs dzs.  . Osteoarthritis   . Osteoporosis   . Parkinson's disease   . Renal artery stenosis (HCC)    a. 90% RRA stenosis previously. b. Duplex 09/2014: R renal artery 60-99%, L renal artery 1-59%.  . S/P cardiac pacemaker procedure, Medtronic Adapta L  ADDRL1, 01/21/12    a. 01/2012 s/p MDT ADDRL1 Adapta  DC PPM (ser # [id number]).  . Symptomatic bradycardia    a. 01/2012 s/p MDT [place] PPM (ser # [id number]).  Marland Kitchen TIA (transient ischemic attack) 06/08   a. 09/2006    PAST SURGICAL  HISTORY:   Past Surgical History:  Procedure Laterality Date  . AORTIC VALVE REPLACEMENT  2001   Porcine valve  . CARDIAC CATHETERIZATION  06/20/2014   Procedure: CORONARY/GRAFT ANGIOGRAPHY;  Surgeon: Lorretta Harp, MD;  Location: [facility] CATH LAB;  Service: Cardiovascular;;  . CATARACT EXTRACTION  06/09   left  . CORONARY ARTERY BYPASS GRAFT  2001   ( van trigt ) aortic valve replacement 2001; LIMA-LAD.  Marland Kitchen CORONARY STENT PLACEMENT  11/2007   8/09  Left Circumflex Stent by Dr Toy Care started and aggrenox stopped  . EP IMPLANTABLE DEVICE N/A 10/30/2015   Procedure: BiV Pacemaker Upgrade;  Surgeon: Thompson Grayer, MD;  Location: [place] CV LAB;  Service: Cardiovascular;  Laterality: N/A;  . ESOPHAGOGASTRODUODENOSCOPY     multiple, Colon/ EGD benign 11/2004  . EYE SURGERY  06/18/15  . INGUINAL HERNIA REPAIR     LIH 1997Llap. bilateral hernias 1998  . LAPAROSCOPIC CHOLECYSTECTOMY  07/11   Dr.Rosenbower  . NM MYOCAR PERF WALL MOTION  11/22/2007   mild septal ischemia,EF 66%  . PACEMAKER INSERTION  01/21/2012   Medtronic Adapta L implanted for complete heart block  . PERMANENT PACEMAKER INSERTION N/A 01/21/2012   Procedure: PERMANENT PACEMAKER INSERTION;  Surgeon: Thompson Grayer, MD;  Location: [facility] CATH LAB;  Service: Cardiovascular;  Laterality: N/A;  . PILONIDAL CYST / SINUS EXCISION  1953  . SQUAMOUS CELL CARCINOMA EXCISION  3/13   back    SOCIAL HISTORY:   Social History   Social History  . Marital status: Married    Spouse name: N/A  . Number of children: 3  . Years of education: N/A   Occupational History  . retired- [employer]   . part-time sub/courier for schools- now only rarely    Social History Main Topics  . Smoking status: Former Smoker    Packs/day: 1.00    Years: 5.00    Types: Cigarettes    Quit date: 06/06/1963  . Smokeless tobacco: Never Used  . Alcohol use No     Comment: very rare  . Drug use: No  . Sexual activity: No   Other Topics Concern  .  Not on file   Social History Narrative   Has living will   Wife, then son [name], hold health care POA   Requests DNR --done   Requests no tube feeds if cognitively unaware    FAMILY HISTORY:   Family Status  Relation Status  . Mother Deceased at age 19   old age  . Father Deceased at age 44   acute bronchitis, cotton exposure  . Sister Deceased   trauma  . Brother Deceased   2, deceased  . Daughter Deceased at age 92   osteosarcoma  . Child Alive   3, healthy  . Brother Alive   healthy  . Sister Alive   healthy    ROS:    A complete 10 system review of systems was obtained and was unremarkable apart from what is mentioned above.  PHYSICAL EXAMINATION:    VITALS:   There were no vitals filed for this visit. Wt Readings from Last 3 Encounters:  12/26/15 145 lb (65.8 kg)  11/24/15 149 lb (67.6 kg)  10/30/15 145 lb (65.8 kg)  GEN:  The patient appears stated age and is in NAD.   He is a bit tearful when talking about cardiac status. HEENT:  Normocephalic, atraumatic.  The mucous membranes are moist. The superficial temporal arteries are without ropiness or tenderness. CV:  RRR Lungs:  CTAB Neck/HEME:  There are no carotid bruits bilaterally.  Neurological examination:  Orientation: The patient is alert and oriented x3.  He is a very good historian today. Montreal Cognitive Assessment  12/26/2015 12/03/2014  Visuospatial/ Executive (0/5) 0 2  Naming (0/3) 3 3  Attention: Read list of digits (0/2) 2 1  Attention: Read list of letters (0/1) 1 0  Attention: Serial 7 subtraction starting at 100 (0/3) 1 2  Language: Repeat phrase (0/2) 2 2  Language : Fluency (0/1) 0 0  Abstraction (0/2) 2 1  Delayed Recall (0/5) 1 2  Orientation (0/6) 4 5  Total 16 18  Adjusted Score (based on education) 17 18    Movement examination: Tone: There is normal tone in the bilateral upper extremities today.  The tone in the lower extremities is normal.  Abnormal movements:  There is a moderate resting tremor bilaterally, R>L.  No dyskinesia. Coordination:  There is no decremation, with any form of RAMS, including alternating supination and pronation of the forearm, hand opening and closing, finger taps, heel taps and toe taps. Gait and Station: The patient has mild difficulty arising out of a deep-seated chair without the use of the hands. The patient's stride length is normal.  He has mild postural instability.    Lab Results  Component Value Date   WBC 8.3 12/18/2015   HGB 13.3 12/18/2015   HCT 38.8 12/18/2015   MCV 91.1 12/18/2015   PLT 331 12/18/2015     Chemistry      Component Value Date/Time   NA 142 12/18/2015 1045   K 4.5 12/18/2015 1045   CL 102 12/18/2015 1045   CO2 29 12/18/2015 1045   BUN 14 12/18/2015 1045   CREATININE 0.94 12/18/2015 1045      Component Value Date/Time   CALCIUM 9.3 12/18/2015 1045   ALKPHOS 64 12/18/2015 1045   AST 13 12/18/2015 1045   ALT <3 (L) 12/18/2015 1045   BILITOT 0.5 12/18/2015 1045     Lab Results  Component Value Date   VITAMINB12 660 03/06/2013   Lab Results  Component Value Date   TSH 4.349 06/17/2014     ASSESSMENT/PLAN:  1.  Idiopathic Parkinsons disease.    -Long discussion again with the patient and his wife.  Unfortunately, he really is in a position where we cannot go up further on the levodopa because of hallucinations and he is not a candidate for Nuplazid/seroquel because of prolonged QT.  He is not a DBS candidate because of multiple other medical problems.  He will take carbidopa/levodopa 50/200 CR, 5 times per day and carbidopa/levodopa 25/100 IR, up to 3times per day.  I wonder if part of the issue with increasing hallucinations, confusion is because he may be taking more medication that he is supposed to.  Wife to start managing medication.  If that doesn't help, I think that we could drop levodopa dosage.  Will reassess in a week after wife takes over medication 2.  Parkinson  disease dementia  -I think this is from long-standing Parkinson's disease.  We have talked about the acetylcholinesterase inhibitors in the past but he was not interested in Exelon because of risk of nausea and had refused  Aricept because he didn't want more medication.  Unfortunately, confusion, hallucinations and paranoia have increased.  I would like to see him on nuplazid but his QT interval makes this less than desirable.  Told them today that if hallucinations/paranoia not better then we may have to drop levodopa and/or try aricept.  May need to sacrifice tremor control to get better handle on hallucinations.  -doesn't drive.  Discussed home safety at length. 3.  B12 deficiency.  -He is on oral supplements  4.  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  -He could not tolerate klonopin.  Is overall mild and talked about home safety 5.  ***

## 2016-01-05 ENCOUNTER — Inpatient Hospital Stay
Admission: EM | Admit: 2016-01-05 | Discharge: 2016-01-10 | DRG: 469 | Disposition: A | Discharge status: Skilled Nursing Facility | Payer: Medicare Other | Attending: Internal Medicine | Admitting: Internal Medicine

## 2016-01-05 ENCOUNTER — Emergency Department: Payer: Medicare Other

## 2016-01-05 ENCOUNTER — Encounter: Payer: Self-pay | Admitting: Occupational Medicine

## 2016-01-05 DIAGNOSIS — Z950 Presence of cardiac pacemaker: Secondary | ICD-10-CM

## 2016-01-05 DIAGNOSIS — W19XXXA Unspecified fall, initial encounter: Secondary | ICD-10-CM

## 2016-01-05 DIAGNOSIS — M1990 Unspecified osteoarthritis, unspecified site: Secondary | ICD-10-CM | POA: Diagnosis present

## 2016-01-05 DIAGNOSIS — G20 Parkinson's disease: Secondary | ICD-10-CM | POA: Diagnosis not present

## 2016-01-05 DIAGNOSIS — S72092A Other fracture of head and neck of left femur, initial encounter for closed fracture: Secondary | ICD-10-CM | POA: Diagnosis not present

## 2016-01-05 DIAGNOSIS — I110 Hypertensive heart disease with heart failure: Secondary | ICD-10-CM | POA: Diagnosis not present

## 2016-01-05 DIAGNOSIS — I350 Nonrheumatic aortic (valve) stenosis: Secondary | ICD-10-CM | POA: Diagnosis present

## 2016-01-05 DIAGNOSIS — I25119 Atherosclerotic heart disease of native coronary artery with unspecified angina pectoris: Secondary | ICD-10-CM

## 2016-01-05 DIAGNOSIS — Z85828 Personal history of other malignant neoplasm of skin: Secondary | ICD-10-CM

## 2016-01-05 DIAGNOSIS — I2510 Atherosclerotic heart disease of native coronary artery without angina pectoris: Secondary | ICD-10-CM | POA: Diagnosis present

## 2016-01-05 DIAGNOSIS — I5042 Chronic combined systolic (congestive) and diastolic (congestive) heart failure: Secondary | ICD-10-CM | POA: Diagnosis present

## 2016-01-05 DIAGNOSIS — Z951 Presence of aortocoronary bypass graft: Secondary | ICD-10-CM

## 2016-01-05 DIAGNOSIS — D62 Acute posthemorrhagic anemia: Secondary | ICD-10-CM | POA: Diagnosis not present

## 2016-01-05 DIAGNOSIS — S72002A Fracture of unspecified part of neck of left femur, initial encounter for closed fracture: Principal | ICD-10-CM

## 2016-01-05 DIAGNOSIS — G9340 Encephalopathy, unspecified: Secondary | ICD-10-CM | POA: Diagnosis not present

## 2016-01-05 DIAGNOSIS — I959 Hypotension, unspecified: Secondary | ICD-10-CM | POA: Diagnosis not present

## 2016-01-05 DIAGNOSIS — S8992XA Unspecified injury of left lower leg, initial encounter: Secondary | ICD-10-CM | POA: Diagnosis not present

## 2016-01-05 DIAGNOSIS — E785 Hyperlipidemia, unspecified: Secondary | ICD-10-CM | POA: Diagnosis present

## 2016-01-05 DIAGNOSIS — S7292XA Unspecified fracture of left femur, initial encounter for closed fracture: Secondary | ICD-10-CM | POA: Diagnosis present

## 2016-01-05 DIAGNOSIS — I255 Ischemic cardiomyopathy: Secondary | ICD-10-CM

## 2016-01-05 DIAGNOSIS — Z8249 Family history of ischemic heart disease and other diseases of the circulatory system: Secondary | ICD-10-CM

## 2016-01-05 DIAGNOSIS — M79605 Pain in left leg: Secondary | ICD-10-CM

## 2016-01-05 DIAGNOSIS — Z01810 Encounter for preprocedural cardiovascular examination: Secondary | ICD-10-CM

## 2016-01-05 DIAGNOSIS — S72009A Fracture of unspecified part of neck of unspecified femur, initial encounter for closed fracture: Secondary | ICD-10-CM | POA: Diagnosis present

## 2016-01-05 DIAGNOSIS — I701 Atherosclerosis of renal artery: Secondary | ICD-10-CM | POA: Diagnosis present

## 2016-01-05 DIAGNOSIS — Z7982 Long term (current) use of aspirin: Secondary | ICD-10-CM

## 2016-01-05 DIAGNOSIS — Z87891 Personal history of nicotine dependence: Secondary | ICD-10-CM

## 2016-01-05 DIAGNOSIS — M25552 Pain in left hip: Secondary | ICD-10-CM | POA: Diagnosis not present

## 2016-01-05 DIAGNOSIS — R079 Chest pain, unspecified: Secondary | ICD-10-CM | POA: Diagnosis not present

## 2016-01-05 DIAGNOSIS — Z9861 Coronary angioplasty status: Secondary | ICD-10-CM

## 2016-01-05 DIAGNOSIS — K219 Gastro-esophageal reflux disease without esophagitis: Secondary | ICD-10-CM | POA: Diagnosis present

## 2016-01-05 DIAGNOSIS — W010XXA Fall on same level from slipping, tripping and stumbling without subsequent striking against object, initial encounter: Secondary | ICD-10-CM | POA: Diagnosis present

## 2016-01-05 DIAGNOSIS — G8918 Other acute postprocedural pain: Secondary | ICD-10-CM

## 2016-01-05 DIAGNOSIS — Z8673 Personal history of transient ischemic attack (TIA), and cerebral infarction without residual deficits: Secondary | ICD-10-CM

## 2016-01-05 DIAGNOSIS — Z419 Encounter for procedure for purposes other than remedying health state, unspecified: Secondary | ICD-10-CM

## 2016-01-05 LAB — CBC WITH DIFFERENTIAL/PLATELET
BASOS PCT: 1 %
Basophils Absolute: 0.1 10*3/uL (ref 0–0.1)
Eosinophils Absolute: 0.3 10*3/uL (ref 0–0.7)
Eosinophils Relative: 3 %
HEMATOCRIT: 39.4 % — AB (ref 40.0–52.0)
HEMOGLOBIN: 13.9 g/dL (ref 13.0–18.0)
LYMPHS ABS: 1.3 10*3/uL (ref 1.0–3.6)
LYMPHS PCT: 12 %
MCH: 31.9 pg (ref 26.0–34.0)
MCHC: 35.2 g/dL (ref 32.0–36.0)
MCV: 90.5 fL (ref 80.0–100.0)
MONO ABS: 0.4 10*3/uL (ref 0.2–1.0)
MONOS PCT: 4 %
NEUTROS ABS: 8.9 10*3/uL — AB (ref 1.4–6.5)
Neutrophils Relative %: 80 %
Platelets: 297 10*3/uL (ref 150–440)
RBC: 4.35 MIL/uL — ABNORMAL LOW (ref 4.40–5.90)
RDW: 14.1 % (ref 11.5–14.5)
WBC: 11.1 10*3/uL — ABNORMAL HIGH (ref 3.8–10.6)

## 2016-01-05 LAB — BASIC METABOLIC PANEL
Anion gap: 5 (ref 5–15)
BUN: 21 mg/dL — AB (ref 6–20)
CALCIUM: 9.2 mg/dL (ref 8.9–10.3)
CHLORIDE: 97 mmol/L — AB (ref 101–111)
CO2: 36 mmol/L — AB (ref 22–32)
CREATININE: 0.69 mg/dL (ref 0.61–1.24)
GFR calc Af Amer: >60 mL/min (ref >60)
GFR calc non Af Amer: >60 mL/min (ref >60)
GLUCOSE: 108 mg/dL — AB (ref 65–99)
Potassium: 3.6 mmol/L (ref 3.5–5.1)
Sodium: 138 mmol/L (ref 135–145)

## 2016-01-05 LAB — TROPONIN I

## 2016-01-05 MED ORDER — ONDANSETRON HCL 4 MG/2ML IJ SOLN
4.0000 mg | Freq: Once | INTRAMUSCULAR | Status: AC
  Administered 2016-01-05: 4 mg via INTRAVENOUS

## 2016-01-05 MED ORDER — ONDANSETRON HCL 4 MG/2ML IJ SOLN
INTRAMUSCULAR | Status: AC
  Filled 2016-01-05: qty 2

## 2016-01-05 MED ORDER — MORPHINE SULFATE (PF) 2 MG/ML IV SOLN
2.0000 mg | Freq: Once | INTRAVENOUS | Status: AC
  Administered 2016-01-05: 2 mg via INTRAVENOUS

## 2016-01-05 MED ORDER — MORPHINE SULFATE (PF) 2 MG/ML IV SOLN
INTRAVENOUS | Status: AC
  Filled 2016-01-05: qty 1

## 2016-01-05 NOTE — ED Notes (Signed)
Dr. Beather Arbour approved of pt taking his own Parkinson medications while in the ED.

## 2016-01-05 NOTE — ED Notes (Signed)
Patient transported to X-ray 

## 2016-01-05 NOTE — ED Provider Notes (Signed)
[facility] Emergency Department Provider Note   ____________________________________________   First MD Initiated Contact with Patient 01/05/16 2316     (approximate)  I have reviewed the triage vital signs and the nursing notes.   HISTORY  Chief Complaint Fall (unwitness ) and Leg Pain (left hip groin and knee)    HPI Ian [name] is a 82 y.o. male who presents to the ED from home via EMS status post fall with left hip pain. Patient reports taking his trash to the curb when the trash can escaped him and he slipped. Did not strike head nor suffer LOC. Complains mainly of left knee to hip pain. Unable to ambulate after the fall. Denies headache, neck pain, vision changes, chest pain, shortness of breath, abdominal pain, nausea, vomiting, diarrhea. Nothing makes the pain better. Movement makes the pain worse.   Past Medical History:  Diagnosis Date  . Allergy   . Aortic valve stenosis, acquired    a. s/p Porcine AVR 2001;  b. 09/2014 [place] Echo: mod-sev bioprosthetic AS w/ minimal change in CO and only mild increase in gradients w/ dobutamine.   Marland Kitchen CAD (coronary artery disease)    a. s/p CABG with LIMA-LAD in 2001 with AVR. b. BMS to LCx 8/08. c. Abnl nuc/dec EF 05/2014 - s/p cath with patent LIMA to LAD, patent dominant right, mild LCx disease, patent stent. Med rx.  . Chronic combined systolic (congestive) and diastolic (congestive) heart failure (HCC)    a. EF 50-55% in 02/2013;  b. 05/2014 Echo: EF 20-25%, Gr1 DD.  Marland Kitchen Diverticulitis   . ED (erectile dysfunction)   . Essential hypertension   . GERD (gastroesophageal reflux disease)   . Hyperlipidemia   . MALT lymphoma ([place])   . Nonischemic cardiomyopathy ([place])    a. 05/2014 Echo: EF 20-25%-->f/u cath w/ nonobs dzs.  . Osteoarthritis   . Osteoporosis   . Parkinson's disease   . Renal artery stenosis (HCC)    a. 90% RRA stenosis previously. b. Duplex 09/2014: R renal artery 60-99%, L renal artery  1-59%.  . S/P cardiac pacemaker procedure, Medtronic Adapta L  ADDRL1, 01/21/12    a. 01/2012 s/p MDT [place] PPM (ser # [id number]).  . Symptomatic bradycardia    a. 01/2012 s/p MDT [place] PPM (ser # [id number]).  Marland Kitchen TIA (transient ischemic attack) 06/08   a. 09/2006    Patient Active Problem List   Diagnosis Date Noted  . Aortic sclerosis ([place]) 10/24/2015  . Aortic atherosclerosis ([place]) 10/24/2015  . Chronic combined systolic (congestive) and diastolic (congestive) heart failure ([place])   . Hyperlipidemia   . Essential hypertension   . CAD (coronary artery disease)   . Chronic systolic CHF (congestive heart failure) ([place]) 06/21/2014  . Ischemic heart disease 06/20/2014  . Abnormal nuclear stress test   . Complete heart block ([place])   . Chronic systolic dysfunction of left ventricle   . Left ventricular dysfunction 06/17/2014  . Advanced directives, counseling/discussion 04/23/2014  . Edema 10/09/2013  . Nonsustained ventricular tachycardia ([place]) 06/24/2013  . Routine general medical examination at a health care facility 04/18/2013  . Vitamin B 12 deficiency 09/04/2012  . Constipation - functional 03/29/2012  . S/P cardiac pacemaker procedure, Medtronic Adapta L  ADDRL1, 01/21/12 01/22/2012  . Right renal artery stenosis - 90% 01/19/2012  . Symptomatic bradycardia, increased fatigue, difficulty staying awake, complete heart block  01/19/2012  . Heart block AV second degree -- 2:1 01/19/2012  .  TIA 10/13/2006  . Parkinson disease ([place]) 10/07/2006  . Essential hypertension, benign 10/07/2006  . ALLERGIC RHINITIS 10/07/2006  . GERD 10/07/2006  . DIVERTICULOSIS, COLON 10/07/2006  . OSTEOARTHRITIS 10/07/2006  . OSTEOPOROSIS 10/07/2006  . Coronary artery disease without angina pectoris 10/07/1999  . Aortic valve stenosis, acquired -- s/p AVR with Porcie Bioprosthetic AVR; By Echo in 05/2009 moderate stenosis,AVA 0.98cm. 09/19/1999    Past Surgical History:    Procedure Laterality Date  . AORTIC VALVE REPLACEMENT  2001   Porcine valve  . CARDIAC CATHETERIZATION  06/20/2014   Procedure: CORONARY/GRAFT ANGIOGRAPHY;  Surgeon: Lorretta Harp, MD;  Location: [facility] CATH LAB;  Service: Cardiovascular;;  . CATARACT EXTRACTION  06/09   left  . CORONARY ARTERY BYPASS GRAFT  2001   ( van trigt ) aortic valve replacement 2001; LIMA-LAD.  Marland Kitchen CORONARY STENT PLACEMENT  11/2007   8/09  Left Circumflex Stent by Dr Toy Care started and aggrenox stopped  . EP IMPLANTABLE DEVICE N/A 10/30/2015   Procedure: BiV Pacemaker Upgrade;  Surgeon: Thompson Grayer, MD;  Location: [facility] CV LAB;  Service: Cardiovascular;  Laterality: N/A;  . ESOPHAGOGASTRODUODENOSCOPY     multiple, Colon/ EGD benign 11/2004  . EYE SURGERY  06/18/15  . INGUINAL HERNIA REPAIR     LIH 1997Llap. bilateral hernias 1998  . LAPAROSCOPIC CHOLECYSTECTOMY  07/11   Dr.Rosenbower  . NM MYOCAR PERF WALL MOTION  11/22/2007   mild septal ischemia,EF 66%  . PACEMAKER INSERTION  01/21/2012   Medtronic Adapta L implanted for complete heart block  . PERMANENT PACEMAKER INSERTION N/A 01/21/2012   Procedure: PERMANENT PACEMAKER INSERTION;  Surgeon: Thompson Grayer, MD;  Location: [facility] CATH LAB;  Service: Cardiovascular;  Laterality: N/A;  . PILONIDAL CYST / SINUS EXCISION  1953  . SQUAMOUS CELL CARCINOMA EXCISION  3/13   back    Prior to Admission medications   Medication Sig Start Date End Date Taking? Authorizing Provider  aspirin 81 MG tablet Take 81 mg by mouth daily.    Historical Provider, MD  bisoprolol-hydrochlorothiazide (ZIAC) 5-6.25 MG tablet TAKE 1 TABLET DAILY 12/05/15   Mihai Croitoru, MD  carbidopa-levodopa (SINEMET CR) 50-200 MG tablet Take 1 tablet by mouth 5 (five) times daily. 11/24/15   Eustace Quail Tat, DO  carbidopa-levodopa (SINEMET IR) 25-100 MG tablet Take 1 tablet by mouth 3 (three) times daily. 11/24/15   Eustace Quail Tat, DO  furosemide (LASIX) 80 MG tablet Take 1 tablet (80 mg total) by mouth  daily. 09/09/15   Mihai Croitoru, MD  lansoprazole (PREVACID) 15 MG capsule Take 15 mg by mouth daily as needed (Approx 2 times weekly). gerd    Historical Provider, MD  Polyethyl Glycol-Propyl Glycol (SYSTANE ULTRA OP) Place 1 drop into both eyes daily.    Historical Provider, MD  polyethylene glycol (MIRALAX / GLYCOLAX) packet Take 17 g by mouth daily.    Historical Provider, MD  potassium chloride (K-DUR,KLOR-CON) 10 MEQ tablet Take 10 mEq by mouth daily.     Historical Provider, MD  vitamin B-12 (CYANOCOBALAMIN) 1000 MCG tablet Take 1,000 mcg by mouth daily.    Historical Provider, MD    Allergies Plavix [clopidogrel]; Pravastatin; Zocor [simvastatin]; and Penicillins  Family History  Problem Relation Age of Onset  . Asthma Father   . Heart disease Brother     Social History Social History  Substance Use Topics  . Smoking status: Former Smoker    Packs/day: 1.00    Years: 5.00    Types: Cigarettes  Quit date: 06/06/1963  . Smokeless tobacco: Never Used  . Alcohol use No     Comment: very rare    Review of Systems  Constitutional: No fever/chills. Eyes: No visual changes. ENT: No sore throat. Cardiovascular: Denies chest pain. Respiratory: Denies shortness of breath. Gastrointestinal: No abdominal pain.  No nausea, no vomiting.  No diarrhea.  No constipation. Genitourinary: Negative for dysuria. Musculoskeletal: Positive for left hip pain. Negative for back pain. Skin: Negative for rash. Neurological: Negative for headaches, focal weakness or numbness.  10-point ROS otherwise negative.  ____________________________________________   PHYSICAL EXAM:  VITAL SIGNS: ED Triage Vitals  Enc Vitals Group     BP 01/05/16 2153 (!) 155/79     Pulse Rate 01/05/16 2153 61     Resp 01/05/16 2153 16     Temp 01/05/16 2153 97.8 F (36.6 C)     Temp Source 01/05/16 2153 Oral     SpO2 01/05/16 2146 96 %     Weight 01/05/16 2154 141 lb (64 kg)     Height 01/05/16 2154 5'  6" (1.676 m)     Head Circumference --      Peak Flow --      Pain Score 01/05/16 2154 3     Pain Loc --      Pain Edu? --      Excl. in [place]? --     Constitutional: Alert and oriented. Frail appearing and in no acute distress. Eyes: Conjunctivae are normal. PERRL. EOMI. Head: Atraumatic. Nose: No congestion/rhinnorhea. Mouth/Throat: Mucous membranes are moist.  Oropharynx non-erythematous. Neck: No stridor.  No cervical spine tenderness to palpation. Cardiovascular: Normal rate, regular rhythm. Grossly normal heart sounds.  Good peripheral circulation. Respiratory: Normal respiratory effort.  No retractions. Lungs CTAB. Gastrointestinal: Soft and nontender. No distention. No abdominal bruits. No CVA tenderness. Musculoskeletal: Left leg with mild rotation, no shortening. Left hip tender to palpation. Limited range of motion secondary to pain. No pedal edema.  No joint effusions. Neurologic:  Normal speech and language. No gross focal neurologic deficits are appreciated. Skin:  Skin is warm, dry and intact. No rash noted. Psychiatric: Mood and affect are normal. Speech and behavior are normal.  ____________________________________________   LABS (all labs ordered are listed, but only abnormal results are displayed)  Labs Reviewed  CBC WITH DIFFERENTIAL/PLATELET  BASIC METABOLIC PANEL  TROPONIN I   ____________________________________________  EKG  ED ECG REPORT I, SUNG,JADE J, the attending physician, personally viewed and interpreted this ECG.   Date: 01/06/2016  EKG Time: 0007  Rate: 69  Rhythm: paced  Axis: Paced  Intervals:none  ST&T Change: paced  ____________________________________________  RADIOLOGY  Left hip x-rays (viewed by me, interpreted per Dr. Candise Che): Impacted and mildly displaced left femoral neck fracture.  Left knee x-rays (viewed by me, interpreted per Dr. Candise Che): No acute bony findings, significant degenerative changes or joint    effusion.   Chest 1 view (viewed by me, interpreted per Dr. Candise Che): No acute cardiopulmonary findings. ____________________________________________   PROCEDURES  Procedure(s) performed: None  Procedures  Critical Care performed: No  ____________________________________________   INITIAL IMPRESSION / ASSESSMENT AND PLAN / ED COURSE  Pertinent labs & imaging results that were available during my care of the patient were reviewed by me and considered in my medical decision making (see chart for details).  82 year old male who presents status post fall with left hip fracture. Discussed with Dr. Rudene Christians from orthopedic surgery. Will discuss with hospitalist to evaluate patient in the  emergency department for admission.  Clinical Course     ____________________________________________   FINAL CLINICAL IMPRESSION(S) / ED DIAGNOSES  Final diagnoses:  Fall, initial encounter  Left leg pain  Closed fracture of neck of left femur, initial encounter ([place])      NEW MEDICATIONS STARTED DURING THIS VISIT:  New Prescriptions   No medications on file     Note:  This document was prepared using Dragon voice recognition software and may include unintentional dictation errors.    Paulette Blanch, MD 01/06/16 [phone]

## 2016-01-05 NOTE — ED Triage Notes (Signed)
Pt presents s/p fall tonight fell at home in the yard. Pt reports his legs slipped from under him. No swelling noted. No rotation to left leg. Pt states pain is above left knee into groin. Pt has hx of dementia. Pt is Alert and orient x3. Pt states unknown if hit head but denies LOC.  Painful with movement.

## 2016-01-06 ENCOUNTER — Encounter
Admission: EM | Disposition: A | Discharge status: Skilled Nursing Facility | Payer: Self-pay | Source: Home / Self Care | Attending: Internal Medicine

## 2016-01-06 ENCOUNTER — Inpatient Hospital Stay: Payer: Medicare Other | Admitting: Anesthesiology

## 2016-01-06 ENCOUNTER — Encounter: Payer: Self-pay | Admitting: Anesthesiology

## 2016-01-06 ENCOUNTER — Inpatient Hospital Stay: Payer: Medicare Other

## 2016-01-06 ENCOUNTER — Ambulatory Visit: Payer: Medicare Other | Admitting: Neurology

## 2016-01-06 DIAGNOSIS — M79605 Pain in left leg: Secondary | ICD-10-CM

## 2016-01-06 DIAGNOSIS — I5042 Chronic combined systolic (congestive) and diastolic (congestive) heart failure: Secondary | ICD-10-CM | POA: Diagnosis present

## 2016-01-06 DIAGNOSIS — S7292XA Unspecified fracture of left femur, initial encounter for closed fracture: Secondary | ICD-10-CM | POA: Diagnosis not present

## 2016-01-06 DIAGNOSIS — M6281 Muscle weakness (generalized): Secondary | ICD-10-CM | POA: Diagnosis not present

## 2016-01-06 DIAGNOSIS — I959 Hypotension, unspecified: Secondary | ICD-10-CM | POA: Diagnosis not present

## 2016-01-06 DIAGNOSIS — I5022 Chronic systolic (congestive) heart failure: Secondary | ICD-10-CM | POA: Diagnosis not present

## 2016-01-06 DIAGNOSIS — I429 Cardiomyopathy, unspecified: Secondary | ICD-10-CM | POA: Diagnosis not present

## 2016-01-06 DIAGNOSIS — M1612 Unilateral primary osteoarthritis, left hip: Secondary | ICD-10-CM | POA: Diagnosis not present

## 2016-01-06 DIAGNOSIS — I255 Ischemic cardiomyopathy: Secondary | ICD-10-CM | POA: Diagnosis not present

## 2016-01-06 DIAGNOSIS — M169 Osteoarthritis of hip, unspecified: Secondary | ICD-10-CM | POA: Diagnosis not present

## 2016-01-06 DIAGNOSIS — D62 Acute posthemorrhagic anemia: Secondary | ICD-10-CM | POA: Diagnosis not present

## 2016-01-06 DIAGNOSIS — S72042A Displaced fracture of base of neck of left femur, initial encounter for closed fracture: Secondary | ICD-10-CM | POA: Diagnosis not present

## 2016-01-06 DIAGNOSIS — S72092A Other fracture of head and neck of left femur, initial encounter for closed fracture: Secondary | ICD-10-CM | POA: Diagnosis not present

## 2016-01-06 DIAGNOSIS — S72002A Fracture of unspecified part of neck of left femur, initial encounter for closed fracture: Secondary | ICD-10-CM | POA: Diagnosis not present

## 2016-01-06 DIAGNOSIS — I25810 Atherosclerosis of coronary artery bypass graft(s) without angina pectoris: Secondary | ICD-10-CM

## 2016-01-06 DIAGNOSIS — S72009A Fracture of unspecified part of neck of unspecified femur, initial encounter for closed fracture: Secondary | ICD-10-CM | POA: Diagnosis present

## 2016-01-06 DIAGNOSIS — W010XXA Fall on same level from slipping, tripping and stumbling without subsequent striking against object, initial encounter: Secondary | ICD-10-CM | POA: Diagnosis present

## 2016-01-06 DIAGNOSIS — E785 Hyperlipidemia, unspecified: Secondary | ICD-10-CM | POA: Diagnosis present

## 2016-01-06 DIAGNOSIS — R2689 Other abnormalities of gait and mobility: Secondary | ICD-10-CM | POA: Diagnosis not present

## 2016-01-06 DIAGNOSIS — K219 Gastro-esophageal reflux disease without esophagitis: Secondary | ICD-10-CM | POA: Diagnosis present

## 2016-01-06 DIAGNOSIS — Z7982 Long term (current) use of aspirin: Secondary | ICD-10-CM | POA: Diagnosis not present

## 2016-01-06 DIAGNOSIS — G20 Parkinson's disease: Secondary | ICD-10-CM | POA: Diagnosis present

## 2016-01-06 DIAGNOSIS — I701 Atherosclerosis of renal artery: Secondary | ICD-10-CM

## 2016-01-06 DIAGNOSIS — Z87891 Personal history of nicotine dependence: Secondary | ICD-10-CM | POA: Diagnosis not present

## 2016-01-06 DIAGNOSIS — M25552 Pain in left hip: Secondary | ICD-10-CM | POA: Diagnosis not present

## 2016-01-06 DIAGNOSIS — Z9861 Coronary angioplasty status: Secondary | ICD-10-CM | POA: Diagnosis not present

## 2016-01-06 DIAGNOSIS — Z951 Presence of aortocoronary bypass graft: Secondary | ICD-10-CM | POA: Diagnosis not present

## 2016-01-06 DIAGNOSIS — Z8673 Personal history of transient ischemic attack (TIA), and cerebral infarction without residual deficits: Secondary | ICD-10-CM | POA: Diagnosis not present

## 2016-01-06 DIAGNOSIS — W19XXXA Unspecified fall, initial encounter: Secondary | ICD-10-CM | POA: Diagnosis not present

## 2016-01-06 DIAGNOSIS — I2510 Atherosclerotic heart disease of native coronary artery without angina pectoris: Secondary | ICD-10-CM | POA: Diagnosis present

## 2016-01-06 DIAGNOSIS — Z7401 Bed confinement status: Secondary | ICD-10-CM | POA: Diagnosis not present

## 2016-01-06 DIAGNOSIS — R079 Chest pain, unspecified: Secondary | ICD-10-CM | POA: Diagnosis not present

## 2016-01-06 DIAGNOSIS — I25119 Atherosclerotic heart disease of native coronary artery with unspecified angina pectoris: Secondary | ICD-10-CM

## 2016-01-06 DIAGNOSIS — Z01810 Encounter for preprocedural cardiovascular examination: Secondary | ICD-10-CM

## 2016-01-06 DIAGNOSIS — G9340 Encephalopathy, unspecified: Secondary | ICD-10-CM | POA: Diagnosis not present

## 2016-01-06 DIAGNOSIS — R410 Disorientation, unspecified: Secondary | ICD-10-CM | POA: Diagnosis not present

## 2016-01-06 DIAGNOSIS — Z85828 Personal history of other malignant neoplasm of skin: Secondary | ICD-10-CM | POA: Diagnosis not present

## 2016-01-06 DIAGNOSIS — I110 Hypertensive heart disease with heart failure: Secondary | ICD-10-CM | POA: Diagnosis present

## 2016-01-06 DIAGNOSIS — R4189 Other symptoms and signs involving cognitive functions and awareness: Secondary | ICD-10-CM | POA: Diagnosis not present

## 2016-01-06 DIAGNOSIS — I350 Nonrheumatic aortic (valve) stenosis: Secondary | ICD-10-CM | POA: Diagnosis present

## 2016-01-06 DIAGNOSIS — Z96642 Presence of left artificial hip joint: Secondary | ICD-10-CM | POA: Diagnosis not present

## 2016-01-06 DIAGNOSIS — Z01818 Encounter for other preprocedural examination: Secondary | ICD-10-CM | POA: Diagnosis not present

## 2016-01-06 DIAGNOSIS — Z950 Presence of cardiac pacemaker: Secondary | ICD-10-CM | POA: Diagnosis not present

## 2016-01-06 DIAGNOSIS — I509 Heart failure, unspecified: Secondary | ICD-10-CM | POA: Diagnosis not present

## 2016-01-06 DIAGNOSIS — Z471 Aftercare following joint replacement surgery: Secondary | ICD-10-CM | POA: Diagnosis not present

## 2016-01-06 DIAGNOSIS — Z8249 Family history of ischemic heart disease and other diseases of the circulatory system: Secondary | ICD-10-CM | POA: Diagnosis not present

## 2016-01-06 DIAGNOSIS — M25559 Pain in unspecified hip: Secondary | ICD-10-CM | POA: Diagnosis not present

## 2016-01-06 DIAGNOSIS — M1990 Unspecified osteoarthritis, unspecified site: Secondary | ICD-10-CM | POA: Diagnosis present

## 2016-01-06 HISTORY — PX: TOTAL HIP ARTHROPLASTY: SHX124

## 2016-01-06 LAB — CBC
HEMATOCRIT: 39.1 % — AB (ref 40.0–52.0)
HEMOGLOBIN: 13.4 g/dL (ref 13.0–18.0)
MCH: 31.7 pg (ref 26.0–34.0)
MCHC: 34.3 g/dL (ref 32.0–36.0)
MCV: 92.2 fL (ref 80.0–100.0)
Platelets: 268 10*3/uL (ref 150–440)
RBC: 4.24 MIL/uL — AB (ref 4.40–5.90)
RDW: 13.5 % (ref 11.5–14.5)
WBC: 17.9 10*3/uL — AB (ref 3.8–10.6)

## 2016-01-06 LAB — BASIC METABOLIC PANEL
ANION GAP: 6 (ref 5–15)
BUN: 19 mg/dL (ref 6–20)
CO2: 34 mmol/L — ABNORMAL HIGH (ref 22–32)
Calcium: 9.1 mg/dL (ref 8.9–10.3)
Chloride: 99 mmol/L — ABNORMAL LOW (ref 101–111)
Creatinine, Ser: 0.82 mg/dL (ref 0.61–1.24)
Glucose, Bld: 139 mg/dL — ABNORMAL HIGH (ref 65–99)
POTASSIUM: 4 mmol/L (ref 3.5–5.1)
SODIUM: 139 mmol/L (ref 135–145)

## 2016-01-06 SURGERY — ARTHROPLASTY, HIP, TOTAL, ANTERIOR APPROACH
Anesthesia: General | Site: Hip | Laterality: Left | Wound class: Clean

## 2016-01-06 MED ORDER — MAGNESIUM HYDROXIDE 400 MG/5ML PO SUSP: 30.0000 mL | Freq: Every day | ORAL | Status: DC | PRN

## 2016-01-06 MED ORDER — CARBIDOPA-LEVODOPA ER 50-200 MG PO TBCR
1.0000 | EXTENDED_RELEASE_TABLET | Freq: Every day | ORAL | Status: DC
  Administered 2016-01-06 – 2016-01-10 (×20): 1 via ORAL
  Filled 2016-01-06 (×22): qty 1

## 2016-01-06 MED ORDER — ONDANSETRON HCL 4 MG PO TABS: 4.0000 mg | ORAL_TABLET | Freq: Four times a day (QID) | ORAL | Status: DC | PRN

## 2016-01-06 MED ORDER — ALUM & MAG HYDROXIDE-SIMETH 200-200-20 MG/5ML PO SUSP: 30.0000 mL | ORAL | Status: DC | PRN

## 2016-01-06 MED ORDER — NITROGLYCERIN 2 % TD OINT
TOPICAL_OINTMENT | TRANSDERMAL | Status: AC
  Filled 2016-01-06: qty 1

## 2016-01-06 MED ORDER — MENTHOL 3 MG MT LOZG
1.0000 | LOZENGE | OROMUCOSAL | Status: DC | PRN
  Filled 2016-01-06: qty 9

## 2016-01-06 MED ORDER — BISOPROLOL-HYDROCHLOROTHIAZIDE 5-6.25 MG PO TABS
1.0000 | ORAL_TABLET | Freq: Every day | ORAL | Status: DC
  Administered 2016-01-06 – 2016-01-08 (×3): 1 via ORAL
  Filled 2016-01-06 (×3): qty 1

## 2016-01-06 MED ORDER — ENOXAPARIN SODIUM 30 MG/0.3ML ~~LOC~~ SOLN
30.0000 mg | SUBCUTANEOUS | Status: DC
  Administered 2016-01-07 – 2016-01-08 (×2): 30 mg via SUBCUTANEOUS
  Filled 2016-01-06 (×2): qty 0.3

## 2016-01-06 MED ORDER — PHENOL 1.4 % MT LIQD
1.0000 | OROMUCOSAL | Status: DC | PRN
  Filled 2016-01-06: qty 177

## 2016-01-06 MED ORDER — ACETAMINOPHEN 10 MG/ML IV SOLN
INTRAVENOUS | Status: AC
  Filled 2016-01-06: qty 100

## 2016-01-06 MED ORDER — BUPIVACAINE-EPINEPHRINE (PF) 0.25% -1:200000 IJ SOLN
INTRAMUSCULAR | Status: AC
  Filled 2016-01-06: qty 30

## 2016-01-06 MED ORDER — CLINDAMYCIN PHOSPHATE 600 MG/50ML IV SOLN
INTRAVENOUS | Status: DC | PRN
  Administered 2016-01-06: 600 mg via INTRAVENOUS

## 2016-01-06 MED ORDER — ALBUMIN HUMAN 5 % IV SOLN
INTRAVENOUS | Status: AC
  Filled 2016-01-06: qty 250

## 2016-01-06 MED ORDER — METHYLPREDNISOLONE SODIUM SUCC 125 MG IJ SOLR
INTRAMUSCULAR | Status: DC | PRN
  Administered 2016-01-06: 125 mg via INTRAVENOUS

## 2016-01-06 MED ORDER — ETOMIDATE 2 MG/ML IV SOLN
INTRAVENOUS | Status: AC
  Filled 2016-01-06: qty 10

## 2016-01-06 MED ORDER — TRANEXAMIC ACID 1000 MG/10ML IV SOLN
INTRAVENOUS | Status: DC | PRN
  Administered 2016-01-06: 1000 mg via INTRAVENOUS

## 2016-01-06 MED ORDER — FENTANYL CITRATE (PF) 100 MCG/2ML IJ SOLN
25.0000 ug | INTRAMUSCULAR | Status: DC | PRN
  Administered 2016-01-06: 25 ug via INTRAVENOUS

## 2016-01-06 MED ORDER — ONDANSETRON HCL 4 MG/2ML IJ SOLN: 4.0000 mg | Freq: Four times a day (QID) | INTRAMUSCULAR | Status: DC | PRN

## 2016-01-06 MED ORDER — SUCCINYLCHOLINE CHLORIDE 20 MG/ML IJ SOLN
INTRAMUSCULAR | Status: DC | PRN
  Administered 2016-01-06: 80 mg via INTRAVENOUS

## 2016-01-06 MED ORDER — POTASSIUM CHLORIDE CRYS ER 20 MEQ PO TBCR
10.0000 meq | EXTENDED_RELEASE_TABLET | Freq: Every day | ORAL | Status: DC
  Administered 2016-01-06: 10 meq via ORAL
  Filled 2016-01-06: qty 1

## 2016-01-06 MED ORDER — ONDANSETRON HCL 4 MG/2ML IJ SOLN: 4.0000 mg | Freq: Once | INTRAMUSCULAR | Status: DC | PRN

## 2016-01-06 MED ORDER — POLYETHYLENE GLYCOL 3350 17 G PO PACK
17.0000 g | PACK | Freq: Every day | ORAL | Status: DC
  Administered 2016-01-06 – 2016-01-10 (×5): 17 g via ORAL
  Filled 2016-01-06 (×5): qty 1

## 2016-01-06 MED ORDER — NEOMYCIN-POLYMYXIN B GU 40-200000 IR SOLN
Status: DC | PRN
  Administered 2016-01-06: 4 mL

## 2016-01-06 MED ORDER — SODIUM CHLORIDE 0.9 % IV SOLN
INTRAVENOUS | Status: DC | PRN
  Administered 2016-01-06 (×2): via INTRAVENOUS

## 2016-01-06 MED ORDER — HYDROCODONE-ACETAMINOPHEN 5-325 MG PO TABS
1.0000 | ORAL_TABLET | ORAL | Status: DC | PRN
  Administered 2016-01-06: 2 via ORAL
  Administered 2016-01-06: 1 via ORAL
  Administered 2016-01-08: 2 via ORAL
  Filled 2016-01-06: qty 1
  Filled 2016-01-06 (×2): qty 2

## 2016-01-06 MED ORDER — CLINDAMYCIN PHOSPHATE 600 MG/50ML IV SOLN
600.0000 mg | Freq: Once | INTRAVENOUS | Status: AC
  Administered 2016-01-06: 600 mg via INTRAVENOUS
  Filled 2016-01-06: qty 50

## 2016-01-06 MED ORDER — POLYVINYL ALCOHOL 1.4 % OP SOLN
1.0000 [drp] | Freq: Every day | OPHTHALMIC | Status: DC
  Administered 2016-01-06 – 2016-01-10 (×4): 1 [drp] via OPHTHALMIC
  Filled 2016-01-06: qty 15

## 2016-01-06 MED ORDER — CLINDAMYCIN PHOSPHATE 600 MG/50ML IV SOLN
600.0000 mg | Freq: Four times a day (QID) | INTRAVENOUS | Status: AC
  Administered 2016-01-07: 600 mg via INTRAVENOUS
  Filled 2016-01-06 (×2): qty 50

## 2016-01-06 MED ORDER — SUGAMMADEX SODIUM 200 MG/2ML IV SOLN
INTRAVENOUS | Status: DC | PRN
  Administered 2016-01-06: 127.4 mg via INTRAVENOUS

## 2016-01-06 MED ORDER — ACETAMINOPHEN 650 MG RE SUPP: 650.0000 mg | Freq: Four times a day (QID) | RECTAL | Status: DC | PRN

## 2016-01-06 MED ORDER — ETOMIDATE 2 MG/ML IV SOLN
INTRAVENOUS | Status: DC | PRN
  Administered 2016-01-06: 12 mg via INTRAVENOUS

## 2016-01-06 MED ORDER — NITROGLYCERIN 2 % TD OINT
0.5000 [in_us] | TOPICAL_OINTMENT | Freq: Once | TRANSDERMAL | Status: AC
  Administered 2016-01-06: 0.5 [in_us] via TOPICAL

## 2016-01-06 MED ORDER — BUPIVACAINE-EPINEPHRINE 0.25% -1:200000 IJ SOLN
INTRAMUSCULAR | Status: DC | PRN
  Administered 2016-01-06: 30 mL

## 2016-01-06 MED ORDER — ACETAMINOPHEN 325 MG PO TABS
650.0000 mg | ORAL_TABLET | Freq: Four times a day (QID) | ORAL | Status: DC | PRN
  Administered 2016-01-07: 650 mg via ORAL
  Filled 2016-01-06: qty 2

## 2016-01-06 MED ORDER — FENTANYL CITRATE (PF) 100 MCG/2ML IJ SOLN
INTRAMUSCULAR | Status: AC
  Filled 2016-01-06: qty 2

## 2016-01-06 MED ORDER — DIPHENHYDRAMINE HCL 12.5 MG/5ML PO ELIX: 12.5000 mg | ORAL_SOLUTION | ORAL | Status: DC | PRN

## 2016-01-06 MED ORDER — VITAMIN B-12 1000 MCG PO TABS
1000.0000 ug | ORAL_TABLET | Freq: Every day | ORAL | Status: DC
  Administered 2016-01-06 – 2016-01-10 (×5): 1000 ug via ORAL
  Filled 2016-01-06 (×5): qty 1

## 2016-01-06 MED ORDER — POLYETHYL GLYCOL-PROPYL GLYCOL 0.4-0.3 % OP SOLN: Freq: Every day | OPHTHALMIC | Status: DC

## 2016-01-06 MED ORDER — CARBIDOPA-LEVODOPA 25-100 MG PO TABS
1.0000 | ORAL_TABLET | Freq: Three times a day (TID) | ORAL | Status: DC
  Administered 2016-01-06 – 2016-01-10 (×12): 1 via ORAL
  Filled 2016-01-06 (×13): qty 1

## 2016-01-06 MED ORDER — TRANEXAMIC ACID 1000 MG/10ML IV SOLN
INTRAVENOUS | Status: AC
  Filled 2016-01-06: qty 10

## 2016-01-06 MED ORDER — SODIUM CHLORIDE 0.9 % IV SOLN
INTRAVENOUS | Status: DC
  Administered 2016-01-06: 04:00:00 via INTRAVENOUS

## 2016-01-06 MED ORDER — KETOROLAC TROMETHAMINE 15 MG/ML IJ SOLN: 15.0000 mg | Freq: Four times a day (QID) | INTRAMUSCULAR | Status: DC | PRN

## 2016-01-06 MED ORDER — ROCURONIUM BROMIDE 100 MG/10ML IV SOLN
INTRAVENOUS | Status: DC | PRN
  Administered 2016-01-06: 20 mg via INTRAVENOUS

## 2016-01-06 MED ORDER — NEOMYCIN-POLYMYXIN B GU 40-200000 IR SOLN
Status: AC
  Filled 2016-01-06: qty 4

## 2016-01-06 MED ORDER — CLINDAMYCIN PHOSPHATE 600 MG/50ML IV SOLN
INTRAVENOUS | Status: AC
  Filled 2016-01-06: qty 50

## 2016-01-06 MED ORDER — KETOROLAC TROMETHAMINE 30 MG/ML IJ SOLN: 15.0000 mg | Freq: Once | INTRAMUSCULAR | Status: DC

## 2016-01-06 MED ORDER — SODIUM CHLORIDE 0.9% FLUSH
3.0000 mL | Freq: Two times a day (BID) | INTRAVENOUS | Status: DC
  Administered 2016-01-06: 3 mL via INTRAVENOUS

## 2016-01-06 MED ORDER — KETOROLAC TROMETHAMINE 30 MG/ML IJ SOLN
INTRAMUSCULAR | Status: AC
  Administered 2016-01-06: 15 mg
  Filled 2016-01-06: qty 1

## 2016-01-06 MED ORDER — MAGNESIUM CITRATE PO SOLN
1.0000 | Freq: Once | ORAL | Status: DC | PRN
  Filled 2016-01-06: qty 296

## 2016-01-06 MED ORDER — BISACODYL 10 MG RE SUPP: 10.0000 mg | Freq: Every day | RECTAL | Status: DC | PRN

## 2016-01-06 MED ORDER — PANTOPRAZOLE SODIUM 40 MG PO TBEC
40.0000 mg | DELAYED_RELEASE_TABLET | Freq: Every day | ORAL | Status: DC
  Administered 2016-01-06 – 2016-01-10 (×5): 40 mg via ORAL
  Filled 2016-01-06 (×6): qty 1

## 2016-01-06 MED ORDER — FENTANYL CITRATE (PF) 100 MCG/2ML IJ SOLN
INTRAMUSCULAR | Status: DC | PRN
  Administered 2016-01-06 (×2): 25 ug via INTRAVENOUS

## 2016-01-06 MED ORDER — PANTOPRAZOLE SODIUM 20 MG PO TBEC: 20.0000 mg | DELAYED_RELEASE_TABLET | Freq: Every day | ORAL | Status: DC

## 2016-01-06 MED ORDER — DOCUSATE SODIUM 100 MG PO CAPS
100.0000 mg | ORAL_CAPSULE | Freq: Two times a day (BID) | ORAL | Status: DC
  Administered 2016-01-06 – 2016-01-10 (×9): 100 mg via ORAL
  Filled 2016-01-06 (×9): qty 1

## 2016-01-06 SURGICAL SUPPLY — 47 items
BLADE SAW SAG 18.5X105 (BLADE) ×3 IMPLANT
BNDG COHESIVE 6X5 TAN STRL LF (GAUZE/BANDAGES/DRESSINGS) ×6 IMPLANT
CANISTER SUCT 1200ML W/VALVE (MISCELLANEOUS) ×3 IMPLANT
CAPT HIP TOTAL 3 ×2 IMPLANT
CATH FOL LEG HOLDER (MISCELLANEOUS) ×3 IMPLANT
CATH TRAY METER 16FR LF (MISCELLANEOUS) ×3 IMPLANT
CHLORAPREP W/TINT 26ML (MISCELLANEOUS) ×3 IMPLANT
DRAPE C-ARM XRAY 36X54 (DRAPES) ×3 IMPLANT
DRAPE INCISE IOBAN 66X60 STRL (DRAPES) IMPLANT
DRAPE POUCH INSTRU U-SHP 10X18 (DRAPES) ×3 IMPLANT
DRAPE SHEET LG 3/4 BI-LAMINATE (DRAPES) ×9 IMPLANT
DRAPE STERI IOBAN 125X83 (DRAPES) ×3 IMPLANT
DRAPE TABLE BACK 80X90 (DRAPES) ×3 IMPLANT
DRSG OPSITE POSTOP 4X8 (GAUZE/BANDAGES/DRESSINGS) ×6 IMPLANT
ELECT BLADE 6.5 EXT (BLADE) ×3 IMPLANT
GAUZE SPONGE 4X4 12PLY STRL (GAUZE/BANDAGES/DRESSINGS) ×3 IMPLANT
GLOVE BIOGEL PI IND STRL 9 (GLOVE) ×1 IMPLANT
GLOVE BIOGEL PI INDICATOR 9 (GLOVE) ×2
GLOVE SURG SYN 9.0  PF PI (GLOVE) ×4
GLOVE SURG SYN 9.0 PF PI (GLOVE) ×2 IMPLANT
GOWN SRG 2XL LVL 4 RGLN SLV (GOWNS) ×1 IMPLANT
GOWN STRL NON-REIN 2XL LVL4 (GOWNS) ×3
GOWN STRL REUS W/ TWL LRG LVL3 (GOWN DISPOSABLE) ×1 IMPLANT
GOWN STRL REUS W/TWL LRG LVL3 (GOWN DISPOSABLE) ×3
HEMOVAC 400CC 10FR (MISCELLANEOUS) ×3 IMPLANT
HOOD PEEL AWAY FLYTE STAYCOOL (MISCELLANEOUS) ×3 IMPLANT
MAT BLUE FLOOR 46X72 FLO (MISCELLANEOUS) ×1 IMPLANT
NDL SAFETY 18GX1.5 (NEEDLE) ×3 IMPLANT
NDL SPNL 18GX3.5 QUINCKE PK (NEEDLE) ×1 IMPLANT
NEEDLE SPNL 18GX3.5 QUINCKE PK (NEEDLE) ×3 IMPLANT
NS IRRIG 1000ML POUR BTL (IV SOLUTION) ×3 IMPLANT
PACK HIP COMPR (MISCELLANEOUS) ×3 IMPLANT
SOL PREP PVP 2OZ (MISCELLANEOUS) ×3
SOLUTION PREP PVP 2OZ (MISCELLANEOUS) ×1 IMPLANT
STAPLER SKIN PROX 35W (STAPLE) ×3 IMPLANT
STRAP SAFETY BODY (MISCELLANEOUS) ×3 IMPLANT
SUT DVC 2 QUILL PDO  T11 36X36 (SUTURE) ×2
SUT DVC 2 QUILL PDO T11 36X36 (SUTURE) ×1 IMPLANT
SUT DVC QUILL MONODERM 30X30 (SUTURE) ×3 IMPLANT
SUT SILK 0 (SUTURE) ×3
SUT SILK 0 30XBRD TIE 6 (SUTURE) ×1 IMPLANT
SUT VIC AB 1 CT1 36 (SUTURE) ×3 IMPLANT
SYR 20CC LL (SYRINGE) ×3 IMPLANT
SYR 30ML LL (SYRINGE) ×3 IMPLANT
TAPE MICROFOAM 4IN (TAPE) ×3 IMPLANT
TOWEL OR 17X26 4PK STRL BLUE (TOWEL DISPOSABLE) ×3 IMPLANT
TUBE KAMVAC SUCTION (TUBING) ×3 IMPLANT

## 2016-01-06 NOTE — Progress Notes (Signed)
Pt c/o left hip pain and chest pain and pressure.  VSS.  Dr. Kayleen Memos at bedside.  Ordered 12 lead EKG.  Pt resting quietly in bed.

## 2016-01-06 NOTE — Anesthesia Procedure Notes (Signed)
Procedure Name: Intubation Date/Time: 01/06/2016 8:43 PM Performed by: Lendon Colonel Pre-anesthesia Checklist: Patient identified, Emergency Drugs available, Suction available, Patient being monitored and Timeout performed Patient Re-evaluated:Patient Re-evaluated prior to inductionOxygen Delivery Method: Circle system utilized Preoxygenation: Pre-oxygenation with 100% oxygen Intubation Type: IV induction Laryngoscope Size: Miller and 2 Grade View: Grade I Tube type: Oral Number of attempts: 1 Airway Equipment and Method: Stylet Placement Confirmation: ETT inserted through vocal cords under direct vision,  positive ETCO2 and breath sounds checked- equal and bilateral Secured at: 22 cm Tube secured with: Tape Dental Injury: Teeth and Oropharynx as per pre-operative assessment

## 2016-01-06 NOTE — Transfer of Care (Signed)
Immediate Anesthesia Transfer of Care Note  Patient: Ian [name]  Procedure(s) Performed: Procedure(s): TOTAL HIP ARTHROPLASTY ANTERIOR APPROACH (Left)  Patient Location: PACU  Anesthesia Type:General  Level of Consciousness: sedated  Airway & Oxygen Therapy: Patient Spontanous Breathing and Patient connected to face mask oxygen  Post-op Assessment: Report given to RN and Post -op Vital signs reviewed and stable  Post vital signs: Reviewed and stable  Last Vitals:  Vitals:   01/06/16 1240 01/06/16 2203  BP: (!) 119/56   Pulse: 64   Resp: 17   Temp: 36.4 C (P) 36.7 C    Last Pain:  Vitals:   01/06/16 2203  TempSrc:   PainSc: (P) Asleep      Patients Stated Pain Goal: 0 ([id number])  Complications: No apparent anesthesia complications

## 2016-01-06 NOTE — Consult Note (Addendum)
Displace femoral neck fracture, pinning unlikely to succeed with angulation on lateral view.  Recommend left THA after cardio eval.  Patient is seen for evaluation of left hip fracture, after suffering a fall last night while pushing his garbage can to the street. His history of parkinsonism as well with marketed upper extremity tremors. He is a poor historian his wife is not present when I saw him this morning. His leg is externally rotated and shortened on the left side. He is able flex extend the toes and has palpable pulses.   X-ray show on the AP view what appears to be impacted fracture bone lateral views infected displaced fracture with loss of alignment and with this parkinsonism and a case is unlikely pinning would be effective.  Impression is displaced left femoral neck fracture  Recommendation is for left anterior total hip. We will try to discuss with his wife later today regarding the Aisea Pore was possible complications, also pending cardiology evaluation

## 2016-01-06 NOTE — Progress Notes (Signed)
Patient awaiting surgery later today,family at bedside,NPO,pain management in progress.

## 2016-01-06 NOTE — Progress Notes (Signed)
Ian [name] was transferred from the ER via stretcher following [name] fall at home. He was accompanied by his wife. Patient was alert but has significant difficulty providing any information about his situation. The wife provided all the admission information. On admission patient denied pain. Skin was checked with another RN. Patient remained hemodynamically stable overnight, Vpaced on the monitor and VS WDL for patient, Patient was kept NPO.

## 2016-01-06 NOTE — Consult Note (Signed)
Cardiology Consultation Note  Patient ID: Ian [name], MRN: [id number], DOB/AGE: 1928-08-02 82 y.o. Admit date: 01/05/2016   Date of Consult: 01/06/2016 Primary Physician: Viviana Simpler, MD Primary Cardiologist: Dr. Gwenlyn Found, MD Requesting Physician: Dr. Estanislado Pandy, MD  Chief Complaint: Mechanical fall Reason for Consult: Surgical clearance   HPI: 82 y.o. male with h/o symptomatic bradycardia/pacemaker dependent s/p MDT BiV PPM upgrade on 10/30/15 (originally palced in 2013), CAD s/p 1-vessel CABG with LIMA to LAD in 2001 s/p PCI/stenting to LCx in 123XX123, chronic systolic CHF with EF of 123XX123, status post bioprosthetic AVR with moderate AS in 2001, renal artery stenosis with 90% stenosed RRA, Parkinson's disease, MALT Lymphoma, HTN, and HLD who presented to the ED on 9/18 after suffering a mechanical fall in his driveway after stubbing his toe while taking out the garbage can leading to a fractured hip that needs surgical repair. Cardiology is consulted for required surgery.   Prior echocardiogram showed evidence of prosthetic valve degeneration with moderate stenosis. The most recent study in 08/2015 showed a mean gradient of 18 mm Hg and a calculated valve area of around 1.2 cm square, essentially unchanged from 2014. LVEF was severely reduced at 25-30%. Ejection fraction decreased sharply between November 2014 and February 2016. Angiography showed no evidence of new coronary lesions and the LIMA to LAD was widely patent. Previous attempts to clarify whether he has low gradient aortic stenosis or equivocal since his cardiac output did not increase with intravenous dobutamine. He was referred for discussion of CRT upgrade and it seems that there was no conviction that this would improve outcome. He has an approximately 90% stenosis of the right renal artery that has been stable in severity for years. He has steadily deteriorating stamina, becoming short of breath after walking one block (down from 2  blocks about a year ago). It is more concerning that he seems to describe some degree of orthopnea. He sleeps on 2, sometimes 3 pillows. Activities limited by dyspnea as well as parkinsonism. He remains very clearheaded and has no evidence of dementia. He denies angina pectoris and does not have palpitations or syncope or dizziness. He was again referred to EP for evaluation of device upgrade this past summer and ultimately underwent upgrade on 10/30/15 to BIV-PPM. Last device interrogation from late July showed no ventricular ectopy.   He was taking out the trash can on 9/18 at 8:15 PM when he stubbed his toe in the driveway leading to a fall and fractured hip. He did not feel any palpitations, chest pain, SOB, nausea, vomiting, dizziness, presyncope, or syncope. He did not hit his head or suffer LOC. Because of his hip pain he presented to [facility] and was found to have a fractured hip. He has done well since his device upgrade. No chest pain or increase in baseline SOB. His orthopnea is stable. Weight stable.      Past Medical History:  Diagnosis Date  . Allergy   . Aortic valve stenosis, acquired    a. s/p Porcine AVR 2001;  b. 09/2014 [place] Echo: mod-sev bioprosthetic AS w/ minimal change in CO and only mild increase in gradients w/ dobutamine.   Marland Kitchen CAD (coronary artery disease)    a. s/p CABG with LIMA-LAD in 2001 with AVR. b. BMS to LCx 8/08. c. Abnl nuc/dec EF 05/2014 - s/p cath with patent LIMA to LAD, patent dominant right, mild LCx disease, patent stent. Med rx.  . Chronic combined systolic (congestive) and diastolic (congestive) heart failure (HCC)  a. EF 50-55% in 02/2013;  b. 05/2014 Echo: EF 20-25%, Gr1 DD.  Marland Kitchen Diverticulitis   . ED (erectile dysfunction)   . Essential hypertension   . GERD (gastroesophageal reflux disease)   . Hyperlipidemia   . MALT lymphoma ([place])   . Nonischemic cardiomyopathy ([place])    a. 05/2014 Echo: EF 20-25%-->f/u cath w/ nonobs dzs.  . Osteoarthritis   .  Osteoporosis   . Parkinson's disease   . Renal artery stenosis (HCC)    a. 90% RRA stenosis previously. b. Duplex 09/2014: R renal artery 60-99%, L renal artery 1-59%.  . S/P cardiac pacemaker procedure, Medtronic Adapta L  ADDRL1, 01/21/12    a. 01/2012 s/p MDT [place] PPM (ser # [id number]).  . Symptomatic bradycardia    a. 01/2012 s/p MDT [place] PPM (ser # [id number]).  Marland Kitchen TIA (transient ischemic attack) 06/08   a. 09/2006      Most Recent Cardiac Studies: As above   Surgical History:  Past Surgical History:  Procedure Laterality Date  . AORTIC VALVE REPLACEMENT  2001   Porcine valve  . CARDIAC CATHETERIZATION  06/20/2014   Procedure: CORONARY/GRAFT ANGIOGRAPHY;  Surgeon: Lorretta Harp, MD;  Location: [facility] CATH LAB;  Service: Cardiovascular;;  . CATARACT EXTRACTION  06/09   left  . CORONARY ARTERY BYPASS GRAFT  2001   ( van trigt ) aortic valve replacement 2001; LIMA-LAD.  Marland Kitchen CORONARY STENT PLACEMENT  11/2007   8/09  Left Circumflex Stent by Dr Toy Care started and aggrenox stopped  . EP IMPLANTABLE DEVICE N/A 10/30/2015   Procedure: BiV Pacemaker Upgrade;  Surgeon: Thompson Grayer, MD;  Location: [facility] CV LAB;  Service: Cardiovascular;  Laterality: N/A;  . ESOPHAGOGASTRODUODENOSCOPY     multiple, Colon/ EGD benign 11/2004  . EYE SURGERY  06/18/15  . INGUINAL HERNIA REPAIR     LIH 1997Llap. bilateral hernias 1998  . LAPAROSCOPIC CHOLECYSTECTOMY  07/11   Dr.Rosenbower  . NM MYOCAR PERF WALL MOTION  11/22/2007   mild septal ischemia,EF 66%  . PACEMAKER INSERTION  01/21/2012   Medtronic Adapta L implanted for complete heart block  . PERMANENT PACEMAKER INSERTION N/A 01/21/2012   Procedure: PERMANENT PACEMAKER INSERTION;  Surgeon: Thompson Grayer, MD;  Location: [facility] C[facility] LAB;  Service: Cardiovascular;  Laterality: N/A;  . PILONIDAL CYST / SINUS EXCISION  1953  . SQUAMOUS CELL CARCINOMA EXCISION  3/13   back     Home Meds: Prior to Admission medications     Medication Sig Start Date End Date Taking? Authorizing Provider  aspirin 81 MG tablet Take 81 mg by mouth daily.   Yes Historical Provider, MD  bisoprolol-hydrochlorothiazide (ZIAC) 5-6.25 MG tablet TAKE 1 TABLET DAILY 12/05/15  Yes Mihai Croitoru, MD  carbidopa-levodopa (SINEMET CR) 50-200 MG tablet Take 1 tablet by mouth 5 (five) times daily. 11/24/15  Yes Rebecca S Tat, DO  carbidopa-levodopa (SINEMET IR) 25-100 MG tablet Take 1 tablet by mouth 3 (three) times daily. 11/24/15  Yes Rebecca S Tat, DO  furosemide (LASIX) 80 MG tablet Take 1 tablet (80 mg total) by mouth daily. 09/09/15  Yes Mihai Croitoru, MD  lansoprazole (PREVACID) 15 MG capsule Take 15 mg by mouth daily as needed (Approx 2 times weekly). gerd   Yes Historical Provider, MD  Polyethyl Glycol-Propyl Glycol (SYSTANE ULTRA OP) Place 1 drop into both eyes daily.   Yes Historical Provider, MD  polyethylene glycol (MIRALAX / GLYCOLAX) packet Take 17 g by mouth daily.   Yes Historical  Provider, MD  potassium chloride (K-DUR,KLOR-CON) 10 MEQ tablet Take 10 mEq by mouth daily.    Yes Historical Provider, MD  vitamin B-12 (CYANOCOBALAMIN) 1000 MCG tablet Take 1,000 mcg by mouth daily.   Yes Historical Provider, MD    Inpatient Medications:  . bisoprolol-hydrochlorothiazide  1 tablet Oral Daily  . carbidopa-levodopa  1 tablet Oral 5 X Daily  . carbidopa-levodopa  1 tablet Oral TID  . clindamycin (CLEOCIN) IV  600 mg Intravenous Once  . docusate sodium  100 mg Oral BID  . pantoprazole  40 mg Oral Daily  . polyethylene glycol  17 g Oral Daily  . polyvinyl alcohol  1 drop Both Eyes Daily  . potassium chloride  10 mEq Oral Daily  . sodium chloride flush  3 mL Intravenous Q12H  . vitamin B-12  1,000 mcg Oral Daily   . sodium chloride 50 mL/hr at 01/06/16 0333    Allergies:  Allergies  Allergen Reactions  . Plavix [Clopidogrel]     Fatigue   . Pravastatin     Muscle aches  . Zocor [Simvastatin]     REACTION: dizziness  .  Penicillins Rash    Has patient had a PCN reaction causing immediate rash, facial/tongue/throat swelling, SOB or lightheadedness with hypotension: YES Has patient had a PCN reaction causing severe rash involving mucus membranes or skin necrosis: NO Has patient had a PCN reaction that required hospitalization NO Has patient had a PCN reaction occurring within the last 10 years: NO If all of the above answers are "NO", then may proceed with Cephalosporin use.    Social History   Social History  . Marital status: Married    Spouse name: N/A  . Number of children: 3  . Years of education: N/A   Occupational History  . retired- [employer]   . part-time sub/courier for schools- now only rarely    Social History Main Topics  . Smoking status: Former Smoker    Packs/day: 1.00    Years: 5.00    Types: Cigarettes    Quit date: 06/06/1963  . Smokeless tobacco: Never Used  . Alcohol use No     Comment: very rare  . Drug use: No  . Sexual activity: No   Other Topics Concern  . Not on file   Social History Narrative   Has living will   Wife, then son [name], hold health care POA   Requests DNR --done   Requests no tube feeds if cognitively unaware     Family History  Problem Relation Age of Onset  . Asthma Father   . Heart disease Brother      Review of Systems: Review of Systems  Constitutional: Positive for malaise/fatigue. Negative for chills, diaphoresis, fever and weight loss.  HENT: Negative for congestion.   Eyes: Negative for discharge and redness.  Respiratory: Negative for cough, sputum production, shortness of breath and wheezing.   Cardiovascular: Negative for chest pain, palpitations, orthopnea, claudication, leg swelling and PND.  Gastrointestinal: Negative for abdominal pain, heartburn, nausea and vomiting.  Musculoskeletal: Positive for falls, joint pain and myalgias.  Skin: Negative for rash.  Neurological: Positive for weakness. Negative for  dizziness, tingling, tremors, sensory change, speech change, focal weakness and loss of consciousness.  Endo/Heme/Allergies: Does not bruise/bleed easily.  Psychiatric/Behavioral: Negative for substance abuse. The patient is not nervous/anxious.   All other systems reviewed and are negative.   Labs:  Recent Labs  01/05/16 2252  TROPONINI <0.03  Lab Results  Component Value Date   WBC 17.9 (H) 01/06/2016   HGB 13.4 01/06/2016   HCT 39.1 (L) 01/06/2016   MCV 92.2 01/06/2016   PLT 268 01/06/2016    Recent Labs Lab 01/06/16 0341  NA 139  K 4.0  CL 99*  CO2 34*  BUN 19  CREATININE 0.82  CALCIUM 9.1  GLUCOSE 139*   Lab Results  Component Value Date   CHOL 163 03/29/2012   HDL 54.80 03/29/2012   LDLCALC 93 03/29/2012   TRIG 75.0 03/29/2012   No results found for: DDIMER  Radiology/Studies:  Dg Chest 1 View  Result Date: 01/05/2016 CLINICAL DATA:  Fractured left hip.  Preoperative chest x-ray. EXAM: CHEST 1 VIEW COMPARISON:  10/30/2015 FINDINGS: Permanent left-sided pacemaker is stable. The pacer wires are unchanged. The heart is normal in size. Marked tortuosity and calcification of the thoracic aorta. Stable surgical changes from valve replacement surgery. The lungs are clear. No pleural effusion or pneumothorax. The bony thorax is intact. IMPRESSION: No acute cardiopulmonary findings. Electronically Signed   By: Marijo Sanes M.D.   On: 01/05/2016 22:50   Dg Knee Complete 4 Views Left  Result Date: 01/05/2016 CLINICAL DATA:  [place] tonight.  Injured left leg EXAM: LEFT KNEE - COMPLETE 4+ VIEW COMPARISON:  None. FINDINGS: The joint spaces are fairly well maintained for the patient's age. No significant degenerative changes, acute fracture or osteochondral lesion. No joint effusion. Moderate atherosclerotic calcifications. IMPRESSION: No acute bony findings, significant degenerative changes or joint effusion. Electronically Signed   By: Marijo Sanes M.D.   On: 01/05/2016  22:49   Dg Hip Unilat With Pelvis 2-3 Views Left  Result Date: 01/05/2016 CLINICAL DATA:  [place] at home today. EXAM: DG HIP (WITH OR WITHOUT PELVIS) 2-3V LEFT COMPARISON:  None. FINDINGS: There is an impacted and slightly displaced high femoral neck fracture on the left. The right hip is intact. The pubic symphysis and SI joints are intact. Surgical changes from a hernia repair are noted. Extensive vascular calcifications. IMPRESSION: Impacted and mildly displaced left femoral neck fracture. Electronically Signed   By: Marijo Sanes M.D.   On: 01/05/2016 22:47    EKG: Interpreted by me showed: A sensed V paced, 69 bpm Telemetry: Interpreted by me showed: paced 67 bpm  Weights: Filed Weights   01/05/16 2154 01/06/16 0256  Weight: 141 lb (64 kg) 140 lb 8 oz (63.7 kg)     Physical Exam: Blood pressure (!) 145/53, pulse 68, temperature 97.7 F (36.5 C), temperature source Oral, resp. rate 18, height 5\' 6"  (1.676 m), weight 140 lb 8 oz (63.7 kg), SpO2 94 %. Body mass index is 22.68 kg/m. General: Well developed, well nourished, in no acute distress. Head: Normocephalic, atraumatic, sclera non-icteric, no xanthomas, nares are without discharge.  Neck: Negative for carotid bruits. JVD not elevated. Lungs: Clear bilaterally to auscultation without wheezes, rales, or rhonchi. Breathing is unlabored. Heart: RRR with S1 S2. No murmurs, rubs, or gallops appreciated. Abdomen: Soft, non-tender, non-distended with normoactive bowel sounds. No hepatomegaly. No rebound/guarding. No obvious abdominal masses. Msk:  Strength and tone appear normal for age. Extremities: No clubbing or cyanosis. No edema. Distal pedal pulses are 2+ and equal bilaterally. Neuro: Alert and oriented X 3. No facial asymmetry. No focal deficit. Moves all extremities spontaneously. Psych:  Responds to questions appropriately with a normal affect.    Assessment and Plan:  Principal Problem:   Femur fracture, left (HCC) Active  Problems:   Hip  fracture ([place])    1. Surgical clearance: -Would not hold required hip fracture surgery at this time. HE has been stable from a cardiac standpoint. He will be at high-risk for non-cardiac surgery. No indication for ischemic evaluation as he would remain at high-risk given his cardiomyopathy that has been stable for the past several months.  -Limit peri-operative fluids given his cardiomyopathy  2. Fall/fractured hip: -This appears to be a mechanical fall in his driveway as he and his wife report him stubbing his toe. He has previously had episodes of asymptomatic NSVT on device interrogations. Cannot rule out ventricular ectopy leading to this event at this time. Will discuss with MD regarding device interrogation.  -Surgery per ortho.  3. CAD s/p CABG and PCI as above: -No symptoms concerning for angina -Continue current medical regimen  4. Chronic systolic CHF/symtpomatic bradycardia s/p MDT BiV-ICD: -He does not appear to be volume overloaded -Continue current medications  5. RRA stenosis: -Stable for years  6. Parkinson's disease: -Per IM   Signed, Christell Faith, PA-C [facility] Pager: [phone] 01/06/2016, 8:00 AM

## 2016-01-06 NOTE — Op Note (Signed)
01/05/2016 - 01/06/2016  10:03 PM  PATIENT:  Ian [name]  82 y.o. male  PRE-OPERATIVE DIAGNOSIS:  displaced left femoral neck fracture, osteoarthritis  POST-OPERATIVE DIAGNOSIS:  displaced left femoral neck fracture, osteoarthritis  PROCEDURE:  Procedure(s): TOTAL HIP ARTHROPLASTY ANTERIOR APPROACH (Left)  SURGEON: Laurene Footman, MD  ASSISTANTS: None  ANESTHESIA:   general  EBL:  No intake/output data recorded.  BLOOD ADMINISTERED:none  DRAINS: none   LOCAL MEDICATIONS USED:  MARCAINE     SPECIMEN:  Source of Specimen:  Femoral head left  DISPOSITION OF SPECIMEN:  PATHOLOGY  COUNTS:  YES  TOURNIQUET:  * No tourniquets in log *  IMPLANTS: Medacta 4 AMIS standard stem with 56 mm mPACT cup DM with liner and M 28 mm head  DICTATION: .Dragon Dictation   The patient was brought to the operating room and after general anesthesia was obtained patient was placed on the operative table with the ipsilateral foot into the Medacta attachment, contralateral leg on a well-padded table. C-arm was brought in and preop template x-ray taken. After prepping and draping in usual sterile fashion appropriate patient identification and timeout procedures were completed. Anterior approach to the hip was obtained and centered over the greater trochanter and TFL muscle. The subcutaneous tissue was incised hemostasis being achieved by electrocautery. TFL fascia was incised and the muscle retracted laterally deep retractor placed. The lateral femoral circumflex vessels were identified and ligated. The anterior capsule was exposed and a capsulotomy performed.  Displaced subcapital fracture identified with fracture separated consistent with unstable femoral neck fracture The neck was identified and a femoral neck cut carried out with a saw. The head was removed without difficulty and showed sclerotic femoral head and acetabulum. Reaming was carried out to 54 mm and a 56 mm cup trial gave appropriate  tightness to the acetabular component a 56 cup was impacted into position. The leg was then externally rotated and ischiofemoral and pubofemoral releases carried out. The femur was sequentially broached to a size 4, size 4 standard stem and M head trials were placed and the final components chosen. The 4 standard stem was inserted along with a M 28 mm head and 56 mm liner. The hip was reduced and was stable the wound was thoroughly irrigated. The deep fascia was closed using a heavy Quill after infiltration of 30 cc of quarter percent Sensorcaine with epinephrine. TXA was injected directly into the joint and then 2-0 Quill to close the skin with skin staples Xeroform and honeycomb dressing applied  PLAN OF CARE: Continue as inpatient

## 2016-01-06 NOTE — Progress Notes (Signed)
Patient ID: Ian [name], male   DOB: [birth year], 82 y.o.   MRN: [id number]  [facility] PROGRESS NOTE  RIXTON LIEB [id number] DOB: 06/27/1928 DOA: 01/05/2016 PCP: Viviana Simpler, MD  HPI/Subjective: Patient states he was walking to get the garbage can in his feet lost footing and he fell. He is having 7 out of 10 pain in his left hip area. No complaints of chest pain but sometimes at night he does get short of breath if lying flat. If he uses a few pillows this is less.  Objective: Vitals:   01/06/16 0220 01/06/16 0256  BP: 115/60 (!) 145/53  Pulse: 66 68  Resp: (!) 21 18  Temp:  97.7 F (36.5 C)    Filed Weights   01/05/16 2154 01/06/16 0256  Weight: 64 kg (141 lb) 63.7 kg (140 lb 8 oz)    ROS: Review of Systems  Constitutional: Negative for chills and fever.  Eyes: Negative for blurred vision.  Respiratory: Positive for shortness of breath. Negative for cough.   Cardiovascular: Negative for chest pain.  Gastrointestinal: Negative for abdominal pain, constipation, diarrhea, nausea and vomiting.  Genitourinary: Negative for dysuria.  Musculoskeletal: Positive for joint pain.  Neurological: Negative for dizziness and headaches.   Exam: Physical Exam  Constitutional: He is oriented to person, place, and time.  HENT:  Nose: No mucosal edema.  Mouth/Throat: No oropharyngeal exudate or posterior oropharyngeal edema.  Eyes: Conjunctivae, EOM and lids are normal. Pupils are equal, round, and reactive to light.  Neck: No JVD present. Carotid bruit is not present. No edema present. No thyroid mass and no thyromegaly present.  Cardiovascular: S1 normal and S2 normal.  Exam reveals no gallop.   Murmur heard.  Systolic murmur is present with a grade of 2/6  Pulses:      Dorsalis pedis pulses are 2+ on the right side, and 2+ on the left side.  Respiratory: No respiratory distress. He has no wheezes. He has no rhonchi. He has no rales.  GI: Soft. Bowel sounds are  normal. There is no tenderness.  Musculoskeletal:       Right ankle: He exhibits no swelling.       Left ankle: He exhibits no swelling.  Left leg shortnes and externally rotated  Lymphadenopathy:    He has no cervical adenopathy.  Neurological: He is alert and oriented to person, place, and time. No cranial nerve deficit.  Positive tremor.  Skin: Skin is warm. No rash noted. Nails show no clubbing.  Psychiatric: He has a normal mood and affect.      Data Reviewed: Basic Metabolic Panel:  Recent Labs Lab 01/05/16 2252 01/06/16 0341  NA 138 139  K 3.6 4.0  CL 97* 99*  CO2 36* 34*  GLUCOSE 108* 139*  BUN 21* 19  CREATININE 0.69 0.82  CALCIUM 9.2 9.1   CBC:  Recent Labs Lab 01/05/16 2252 01/06/16 0341  WBC 11.1* 17.9*  NEUTROABS 8.9*  --   HGB 13.9 13.4  HCT 39.4* 39.1*  MCV 90.5 92.2  PLT 297 268   Cardiac Enzymes:  Recent Labs Lab 01/05/16 2252  TROPONINI <0.03     Studies: Dg Chest 1 View  Result Date: 01/05/2016 CLINICAL DATA:  Fractured left hip.  Preoperative chest x-ray. EXAM: CHEST 1 VIEW COMPARISON:  10/30/2015 FINDINGS: Permanent left-sided pacemaker is stable. The pacer wires are unchanged. The heart is normal in size. Marked tortuosity and calcification of the thoracic aorta. Stable surgical changes from valve  replacement surgery. The lungs are clear. No pleural effusion or pneumothorax. The bony thorax is intact. IMPRESSION: No acute cardiopulmonary findings. Electronically Signed   By: Marijo Sanes M.D.   On: 01/05/2016 22:50   Dg Knee Complete 4 Views Left  Result Date: 01/05/2016 CLINICAL DATA:  [place] tonight.  Injured left leg EXAM: LEFT KNEE - COMPLETE 4+ VIEW COMPARISON:  None. FINDINGS: The joint spaces are fairly well maintained for the patient's age. No significant degenerative changes, acute fracture or osteochondral lesion. No joint effusion. Moderate atherosclerotic calcifications. IMPRESSION: No acute bony findings, significant  degenerative changes or joint effusion. Electronically Signed   By: Marijo Sanes M.D.   On: 01/05/2016 22:49   Dg Hip Unilat With Pelvis 2-3 Views Left  Result Date: 01/05/2016 CLINICAL DATA:  [place] at home today. EXAM: DG HIP (WITH OR WITHOUT PELVIS) 2-3V LEFT COMPARISON:  None. FINDINGS: There is an impacted and slightly displaced high femoral neck fracture on the left. The right hip is intact. The pubic symphysis and SI joints are intact. Surgical changes from a hernia repair are noted. Extensive vascular calcifications. IMPRESSION: Impacted and mildly displaced left femoral neck fracture. Electronically Signed   By: Marijo Sanes M.D.   On: 01/05/2016 22:47    Scheduled Meds: . bisoprolol-hydrochlorothiazide  1 tablet Oral Daily  . carbidopa-levodopa  1 tablet Oral 5 X Daily  . carbidopa-levodopa  1 tablet Oral TID  . clindamycin (CLEOCIN) IV  600 mg Intravenous Once  . docusate sodium  100 mg Oral BID  . pantoprazole  40 mg Oral Daily  . polyethylene glycol  17 g Oral Daily  . polyvinyl alcohol  1 drop Both Eyes Daily  . potassium chloride  10 mEq Oral Daily  . sodium chloride flush  3 mL Intravenous Q12H  . vitamin B-12  1,000 mcg Oral Daily   Continuous Infusions: . sodium chloride 50 mL/hr at 01/06/16 0333    Assessment/Plan:  1. Pre-operative evaluation for left hip fracture. Patient must go to the operating room to repair left hip. Patient is high risk because of moderate aortic stenosis and severe cardiomyopathy.  Mortality within the first year of hip fracture is very high. In order to prevent complications such as skin breakdown, pneumonia, DVT and death the patient must go to the operating room to repair the hip. I would not do any further testing prior to the operating room. 2. Parkinson's disease. Continue Sinemet long-acting and short acting 3. History of coronary artery disease and chronic systolic congestive heart failure with moderate aortic stenosis. No signs of  congestive heart failure at this point. Hopefully can stop IV fluids soon after surgery. 4. History of TIA 5. GERD on Protonix  Code Status:     Code Status Orders        Start     Ordered   01/06/16 0245  Full code  Continuous     01/06/16 0244    Code Status History    Date Active Date Inactive Code Status Order ID Comments User Context   10/30/2015  9:55 AM 10/30/2015  8:33 PM Full Code [id number]  Thompson Grayer, MD Inpatient   06/10/2015 11:26 AM 10/30/2015  9:55 AM DNR [id number]  Newt Minion, RN Outpatient   06/20/2014  5:10 PM 06/21/2014  5:22 PM Full Code [id number]  Lorretta Harp, MD Inpatient   01/21/2012  6:49 PM 01/22/2012  3:33 PM Full Code [id number]  Willette Pa, RN Inpatient    Advance  Directive Documentation   Flowsheet Row Most Recent Value  Type of Advance Directive  Healthcare Power of Attorney  Pre-existing out of facility DNR order (yellow form or pink MOST form)  No data  "MOST" Form in Place?  No data      Disposition Plan: Likely rehabilitation on postoperative day 3 if all goes well postoperatively.  Consultants:  Cardiology  Orthopedic surgery  Time spent: 33 minutes  [place], [place]

## 2016-01-06 NOTE — Anesthesia Preprocedure Evaluation (Addendum)
Anesthesia Evaluation  Patient identified by MRN, date of birth, ID band Patient awake    Reviewed: Allergy & Precautions, NPO status , Patient's Chart, lab work & pertinent test results, reviewed documented beta blocker date and time   Airway Mallampati: II       Dental  (+) Edentulous Lower, Edentulous Upper, Upper Dentures, Lower Dentures   Pulmonary shortness of breath and with exertion, former smoker,    Pulmonary exam normal breath sounds clear to auscultation       Cardiovascular hypertension, Pt. on medications and Pt. on home beta blockers + CAD, + CABG, + Peripheral Vascular Disease and +CHF  + dysrhythmias Ventricular Tachycardia + pacemaker + Valvular Problems/Murmurs AS   Paced with medtronic pacemaker   Neuro/Psych Parkinsons Dz TIAnegative psych ROS   GI/Hepatic GERD  Medicated and Controlled,diverticulitis   Endo/Other  negative endocrine ROS  Renal/GU Renal diseaseRenal artery stenosis     Musculoskeletal  (+) Arthritis , Fx Hip   Abdominal Normal abdominal exam  (+)   Peds negative pediatric ROS (+)  Hematology   Anesthesia Other Findings Moderate Aortic stenosis CHF/cardiomyopathy with EF of 25% Renal artery stenosis HTN Parkinsons disease CAD Pacemaker ( Medtronic ) For complete heart block and Hx of non-sustained VT TIA MALT Lymphoma   Reproductive/Obstetrics                            Anesthesia Physical Anesthesia Plan  ASA: IV  Anesthesia Plan: General   Post-op Pain Management:    Induction: Intravenous  Airway Management Planned: Oral ETT  Additional Equipment:   Intra-op Plan: Utilization Of Total Body Hypothermia per surgeon request  Post-operative Plan: Extubation in OR  Informed Consent: I have reviewed the patients History and Physical, chart, labs and discussed the procedure including the risks, benefits and alternatives for the proposed  anesthesia with the patient or authorized representative who has indicated his/her understanding and acceptance.   Dental advisory given  Plan Discussed with:   Anesthesia Plan Comments: (Patient cleared by cardiology, but remains a very high risk patient for cardiovascular and Pulmonary complications.  Will opt for GOT to better control BP in light of moderate AS and decreased EF%.  Discussed case with wife and patient who both appear to understand and agree.)       Anesthesia Quick Evaluation

## 2016-01-06 NOTE — Progress Notes (Signed)
Initial Nutrition Assessment  DOCUMENTATION CODES:   Non-severe (moderate) malnutrition in context of chronic illness  INTERVENTION:  -Ensure Enlive po BID, each supplement provides 350 kcal and 20 grams of protein w/ diet advancement -Encourage PO intake  NUTRITION DIAGNOSIS:   Malnutrition related to chronic illness as evidenced by moderate depletion of body fat, moderate depletions of muscle mass, percent weight loss.  GOAL:   Patient will meet greater than or equal to 90% of their needs  MONITOR:   PO intake, Supplement acceptance, Labs, I & O's, Skin, Weight trends  REASON FOR ASSESSMENT:   Malnutrition Screening Tool    ASSESSMENT:   HISTORY OF PRESENT ILLNESS: Ian [name]  is a 82 y.o. male with a known history of Aortic valve stenosis with status post bioprosthetic valve, coronary artery disease, CABG, systolic and diastolic heart failure, hypertension, GERD, hyperlipidemia , nonischemic cardiomyopathy presented to the emergency room with fall.  Spoke with Mr. Lizano, family at bedside. He endorses poor appetite, wife states patient eats "regularly but does not eat a lot." He eats more small meals, does not consume boost or ensure, has always been on the thinner side per wife and son, but is now continually losing weight. His weight has fluctuated over the past 6-7 months, but most recently he exhibits a 9#/6% severe wt loss over 1 month. He is currently NPO, awaiting surgery's recommendations, if patient will undergo needed hip repair today or tomorrow. Per cardiology pt is high risk for non cardiac surgery given cardiomyopathy. Nutrition-Focused physical exam completed. Findings are moderate fat depletion, moderate muscle depletion, and no edema.  Labs and medications reviewed: Colace, Miralax/Glycolax, KCL 48mEq, Vit B12, NS @ 74mL/hr  Diet Order:  Diet NPO time specified  Skin:  Reviewed, no issues  Last BM:  9/18  Height:   Ht Readings from Last 1  Encounters:  01/05/16 5\' 6"  (1.676 m)    Weight:   Wt Readings from Last 1 Encounters:  01/06/16 140 lb 8 oz (63.7 kg)    Ideal Body Weight:  64.54 kg  BMI:  Body mass index is 22.68 kg/m.  Estimated Nutritional Needs:   Kcal:  1500-1900 calories  Protein:  63-76 gm  Fluid:  >/= 1.5L  EDUCATION NEEDS:   No education needs identified at this time  Satira Anis. Stefon Ramthun, MS, RD LDN Inpatient Clinical Dietitian Pager [phone]

## 2016-01-06 NOTE — H&P (Signed)
[facility] NAME: Ian [name]    MR#:  [id number]  DATE OF BIRTH:  [birth year]  DATE OF ADMISSION:  01/05/2016  PRIMARY CARE PHYSICIAN: Viviana Simpler, MD   REQUESTING/REFERRING PHYSICIAN:   CHIEF COMPLAINT:   Chief Complaint  Patient presents with  . Fall    unwitness   . Leg Pain    left hip groin and knee    HISTORY OF PRESENT ILLNESS: Ian [name]  is a 82 y.o. male with a known history of Aortic valve stenosis with status post bioprosthetic valve, coronary artery disease, CABG, systolic and diastolic heart failure, hypertension, GERD, hyperlipidemia , nonischemic cardiomyopathy presented to the emergency room with fall. Patient lost balance and fell down on the driveway of his home . He was trying to roll the garbage cans down the driveway slope, he slipped and fell down and landed on left hip. Patient has aching pain in the groin and left hip which is 6 out of 10 on a scale of 1-10. No history of any chest pain. No complaints of shortness of breath. No history of any nausea or vomiting. Patient has tremor in the upper extremity secondary to Parkinson's disease. Hospitalist service was consulted for further care of the patient.  PAST MEDICAL HISTORY:   Past Medical History:  Diagnosis Date  . Allergy   . Aortic valve stenosis, acquired    a. s/p Porcine AVR 2001;  b. 09/2014 [place] Echo: mod-sev bioprosthetic AS w/ minimal change in CO and only mild increase in gradients w/ dobutamine.   Marland Kitchen CAD (coronary artery disease)    a. s/p CABG with LIMA-LAD in 2001 with AVR. b. BMS to LCx 8/08. c. Abnl nuc/dec EF 05/2014 - s/p cath with patent LIMA to LAD, patent dominant right, mild LCx disease, patent stent. Med rx.  . Chronic combined systolic (congestive) and diastolic (congestive) heart failure (HCC)    a. EF 50-55% in 02/2013;  b. 05/2014 Echo: EF 20-25%, Gr1 DD.  Marland Kitchen Diverticulitis   . ED (erectile dysfunction)   . Essential  hypertension   . GERD (gastroesophageal reflux disease)   . Hyperlipidemia   . MALT lymphoma ([place])   . Nonischemic cardiomyopathy ([place])    a. 05/2014 Echo: EF 20-25%-->f/u cath w/ nonobs dzs.  . Osteoarthritis   . Osteoporosis   . Parkinson's disease   . Renal artery stenosis (HCC)    a. 90% RRA stenosis previously. b. Duplex 09/2014: R renal artery 60-99%, L renal artery 1-59%.  . S/P cardiac pacemaker procedure, Medtronic Adapta L  ADDRL1, 01/21/12    a. 01/2012 s/p MDT [place] PPM (ser # [id number]).  . Symptomatic bradycardia    a. 01/2012 s/p MDT [place] PPM (ser # [id number]).  Marland Kitchen TIA (transient ischemic attack) 06/08   a. 09/2006    PAST SURGICAL HISTORY: Past Surgical History:  Procedure Laterality Date  . AORTIC VALVE REPLACEMENT  2001   Porcine valve  . CARDIAC CATHETERIZATION  06/20/2014   Procedure: CORONARY/GRAFT ANGIOGRAPHY;  Surgeon: Lorretta Harp, MD;  Location: [facility] CATH LAB;  Service: Cardiovascular;;  . CATARACT EXTRACTION  06/09   left  . CORONARY ARTERY BYPASS GRAFT  2001   ( van trigt ) aortic valve replacement 2001; LIMA-LAD.  Marland Kitchen CORONARY STENT PLACEMENT  11/2007   8/09  Left Circumflex Stent by Dr Toy Care started and aggrenox stopped  . EP IMPLANTABLE DEVICE N/A 10/30/2015   Procedure: BiV  Pacemaker Upgrade;  Surgeon: Thompson Grayer, MD;  Location: [facility] CV LAB;  Service: Cardiovascular;  Laterality: N/A;  . ESOPHAGOGASTRODUODENOSCOPY     multiple, Colon/ EGD benign 11/2004  . EYE SURGERY  06/18/15  . INGUINAL HERNIA REPAIR     LIH 1997Llap. bilateral hernias 1998  . LAPAROSCOPIC CHOLECYSTECTOMY  07/11   Dr.Rosenbower  . NM MYOCAR PERF WALL MOTION  11/22/2007   mild septal ischemia,EF 66%  . PACEMAKER INSERTION  01/21/2012   Medtronic Adapta L implanted for complete heart block  . PERMANENT PACEMAKER INSERTION N/A 01/21/2012   Procedure: PERMANENT PACEMAKER INSERTION;  Surgeon: Thompson Grayer, MD;  Location: [facility] CATH LAB;  Service:  Cardiovascular;  Laterality: N/A;  . PILONIDAL CYST / SINUS EXCISION  1953  . SQUAMOUS CELL CARCINOMA EXCISION  3/13   back    SOCIAL HISTORY:  Social History  Substance Use Topics  . Smoking status: Former Smoker    Packs/day: 1.00    Years: 5.00    Types: Cigarettes    Quit date: 06/06/1963  . Smokeless tobacco: Never Used  . Alcohol use No     Comment: very rare    FAMILY HISTORY:  Family History  Problem Relation Age of Onset  . Asthma Father   . Heart disease Brother     DRUG ALLERGIES:  Allergies  Allergen Reactions  . Plavix [Clopidogrel]     Fatigue   . Pravastatin     Muscle aches  . Zocor [Simvastatin]     REACTION: dizziness  . Penicillins Rash    Has patient had a PCN reaction causing immediate rash, facial/tongue/throat swelling, SOB or lightheadedness with hypotension: YES Has patient had a PCN reaction causing severe rash involving mucus membranes or skin necrosis: NO Has patient had a PCN reaction that required hospitalization NO Has patient had a PCN reaction occurring within the last 10 years: NO If all of the above answers are "NO", then may proceed with Cephalosporin use.    REVIEW OF SYSTEMS:   CONSTITUTIONAL: No fever, fatigue or weakness.  EYES: No blurred or double vision.  EARS, NOSE, AND THROAT: No tinnitus or ear pain.  RESPIRATORY: No cough, shortness of breath, wheezing or hemoptysis.  CARDIOVASCULAR: No chest pain, orthopnea, edema.  GASTROINTESTINAL: No nausea, vomiting, diarrhea or abdominal pain.  GENITOURINARY: No dysuria, hematuria.  ENDOCRINE: No polyuria, nocturia,  HEMATOLOGY: No anemia, easy bruising or bleeding SKIN: No rash or lesion. MUSCULOSKELETAL: Has left hip pain  Tremor upper extremities NEUROLOGIC: No tingling, numbness, weakness.  PSYCHIATRY: No anxiety or depression.   MEDICATIONS AT HOME:  Prior to Admission medications   Medication Sig Start Date End Date Taking? Authorizing Provider  aspirin 81 MG  tablet Take 81 mg by mouth daily.   Yes Historical Provider, MD  bisoprolol-hydrochlorothiazide (ZIAC) 5-6.25 MG tablet TAKE 1 TABLET DAILY 12/05/15  Yes Mihai Croitoru, MD  carbidopa-levodopa (SINEMET CR) 50-200 MG tablet Take 1 tablet by mouth 5 (five) times daily. 11/24/15  Yes Rebecca S Tat, DO  carbidopa-levodopa (SINEMET IR) 25-100 MG tablet Take 1 tablet by mouth 3 (three) times daily. 11/24/15  Yes Rebecca S Tat, DO  furosemide (LASIX) 80 MG tablet Take 1 tablet (80 mg total) by mouth daily. 09/09/15  Yes Mihai Croitoru, MD  lansoprazole (PREVACID) 15 MG capsule Take 15 mg by mouth daily as needed (Approx 2 times weekly). gerd   Yes Historical Provider, MD  Polyethyl Glycol-Propyl Glycol (SYSTANE ULTRA OP) Place 1 drop into both eyes daily.  Yes Historical Provider, MD  polyethylene glycol (MIRALAX / GLYCOLAX) packet Take 17 g by mouth daily.   Yes Historical Provider, MD  potassium chloride (K-DUR,KLOR-CON) 10 MEQ tablet Take 10 mEq by mouth daily.    Yes Historical Provider, MD  vitamin B-12 (CYANOCOBALAMIN) 1000 MCG tablet Take 1,000 mcg by mouth daily.   Yes Historical Provider, MD      PHYSICAL EXAMINATION:   VITAL SIGNS: Blood pressure (!) 141/53, pulse 66, temperature 97.8 F (36.6 C), temperature source Oral, resp. rate (!) 21, height 5\' 6"  (1.676 m), weight 64 kg (141 lb), SpO2 93 %.  GENERAL:  82 y.o.-year-old patient lying in the bed with no acute distress.  EYES: Pupils equal, round, reactive to light and accommodation. No scleral icterus. Extraocular muscles intact.  HEENT: Head atraumatic, normocephalic. Oropharynx and nasopharynx clear.  NECK:  Supple, no jugular venous distention. No thyroid enlargement, no tenderness.  LUNGS: Normal breath sounds bilaterally, no wheezing, rales,rhonchi or crepitation. No use of accessory muscles of respiration.  CARDIOVASCULAR: S1, S2 normal. No murmurs, rubs, or gallops.  ABDOMEN: Soft, nontender, nondistended. Bowel sounds present. No  organomegaly or mass.  EXTREMITIES: No pedal edema, cyanosis, or clubbing.  Left hip tenderness noted.Tenderness in the groin too. NEUROLOGIC: Cranial nerves II through XII are intact. Muscle strength 5/5 in all extremities. Sensation intact. Gait not checked.  PSYCHIATRIC: The patient is alert and oriented x 3.  SKIN: No obvious rash, lesion, or ulcer.   LABORATORY PANEL:   CBC  Recent Labs Lab 01/05/16 2252  WBC 11.1*  HGB 13.9  HCT 39.4*  PLT 297  MCV 90.5  MCH 31.9  MCHC 35.2  RDW 14.1  LYMPHSABS 1.3  MONOABS 0.4  EOSABS 0.3  BASOSABS 0.1   ------------------------------------------------------------------------------------------------------------------  Chemistries   Recent Labs Lab 01/05/16 2252  NA 138  K 3.6  CL 97*  CO2 36*  GLUCOSE 108*  BUN 21*  CREATININE 0.69  CALCIUM 9.2   ------------------------------------------------------------------------------------------------------------------ estimated creatinine clearance is 62 mL/min (by C-G formula based on SCr of 0.69 mg/dL). ------------------------------------------------------------------------------------------------------------------ No results for input(s): TSH, T4TOTAL, T3FREE, THYROIDAB in the last 72 hours.  Invalid input(s): FREET3   Coagulation profile No results for input(s): INR, PROTIME in the last 168 hours. ------------------------------------------------------------------------------------------------------------------- No results for input(s): DDIMER in the last 72 hours. -------------------------------------------------------------------------------------------------------------------  Cardiac Enzymes  Recent Labs Lab 01/05/16 2252  TROPONINI <0.03   ------------------------------------------------------------------------------------------------------------------ Invalid input(s):  POCBNP  ---------------------------------------------------------------------------------------------------------------  Urinalysis    Component Value Date/Time   COLORURINE DARK YELLOW 12/18/2015 1045   APPEARANCEUR CLEAR 12/18/2015 1045   LABSPEC 1.015 12/18/2015 1045   PHURINE 6.0 12/18/2015 1045   GLUCOSEU NEGATIVE 12/18/2015 1045   HGBUR NEGATIVE 12/18/2015 1045   BILIRUBINUR NEGATIVE 12/18/2015 1045   KETONESUR TRACE (A) 12/18/2015 1045   PROTEINUR NEGATIVE 12/18/2015 1045   UROBILINOGEN 1.0 08/03/2009 0927   NITRITE NEGATIVE 12/18/2015 1045   LEUKOCYTESUR NEGATIVE 12/18/2015 1045     RADIOLOGY: Dg Chest 1 View  Result Date: 01/05/2016 CLINICAL DATA:  Fractured left hip.  Preoperative chest x-ray. EXAM: CHEST 1 VIEW COMPARISON:  10/30/2015 FINDINGS: Permanent left-sided pacemaker is stable. The pacer wires are unchanged. The heart is normal in size. Marked tortuosity and calcification of the thoracic aorta. Stable surgical changes from valve replacement surgery. The lungs are clear. No pleural effusion or pneumothorax. The bony thorax is intact. IMPRESSION: No acute cardiopulmonary findings. Electronically Signed   By: Marijo Sanes M.D.   On: 01/05/2016 22:50   Dg  Knee Complete 4 Views Left  Result Date: 01/05/2016 CLINICAL DATA:  [place] tonight.  Injured left leg EXAM: LEFT KNEE - COMPLETE 4+ VIEW COMPARISON:  None. FINDINGS: The joint spaces are fairly well maintained for the patient's age. No significant degenerative changes, acute fracture or osteochondral lesion. No joint effusion. Moderate atherosclerotic calcifications. IMPRESSION: No acute bony findings, significant degenerative changes or joint effusion. Electronically Signed   By: Marijo Sanes M.D.   On: 01/05/2016 22:49   Dg Hip Unilat With Pelvis 2-3 Views Left  Result Date: 01/05/2016 CLINICAL DATA:  [place] at home today. EXAM: DG HIP (WITH OR WITHOUT PELVIS) 2-3V LEFT COMPARISON:  None. FINDINGS: There is an  impacted and slightly displaced high femoral neck fracture on the left. The right hip is intact. The pubic symphysis and SI joints are intact. Surgical changes from a hernia repair are noted. Extensive vascular calcifications. IMPRESSION: Impacted and mildly displaced left femoral neck fracture. Electronically Signed   By: Marijo Sanes M.D.   On: 01/05/2016 22:47    EKG: Orders placed or performed during the hospital encounter of 01/05/16  . ED EKG  . ED EKG    IMPRESSION AND PLAN: 82 year old elderly male patient with history of cardiomyopathy, aortic valve replacement with bioprosthetic valve, coronary artery disease, CABG, hypertension, hyperlipidemia, congestive heart failure presented to the emergency room with accidental fall. Admitting diagnosis 1. Accidental fall 2. Left hip fracture 3. Cardiomyopathy 4. Chronic heart failure 5. Coronary artery disease Treatment plan Admit patient to medical floor with cardiac monitoring Keep patient nothing by mouth Pain management DVT prophylaxis with sequential compression devices to lower extremities Cardiology consultation for preop clearance for patient going for surgery Gentle IV fluid hydration Supportive care.  All the records are reviewed and case discussed with ED provider. Management plans discussed with the patient, family and they are in agreement.  CODE STATUS:FULL Code Status History    Date Active Date Inactive Code Status Order ID Comments User Context   10/30/2015  9:55 AM 10/30/2015  8:33 PM Full Code [id number]  Thompson Grayer, MD Inpatient   06/10/2015 11:26 AM 10/30/2015  9:55 AM DNR [id number]  Newt Minion, RN Outpatient   06/20/2014  5:10 PM 06/21/2014  5:22 PM Full Code [id number]  Lorretta Harp, MD Inpatient   01/21/2012  6:49 PM 01/22/2012  3:33 PM Full Code [id number]  Willette Pa, RN Inpatient       TOTAL TIME TAKING CARE OF THIS PATIENT: 50 minutes.    Saundra Shelling M.D on 01/06/2016 at 1:54  AM  Between 7am to 6pm - Pager - [phone]  After 6pm go to [email or web address] - password [email or web address] [facility]  Office  [phone]  CC: Primary care physician; Viviana Simpler, MD

## 2016-01-06 NOTE — Care Management (Signed)
Patient presents from home with hip fracture after falling in his driveway.  Does have history of parkinsons.  He was admitted to telemetry due strong cardiac history.  Cardiology has cleared for surgery which should occur today

## 2016-01-07 LAB — CBC
HEMATOCRIT: 34.6 % — AB (ref 40.0–52.0)
Hemoglobin: 12.2 g/dL — ABNORMAL LOW (ref 13.0–18.0)
MCH: 32 pg (ref 26.0–34.0)
MCHC: 35.2 g/dL (ref 32.0–36.0)
MCV: 90.8 fL (ref 80.0–100.0)
PLATELETS: 243 10*3/uL (ref 150–440)
RBC: 3.81 MIL/uL — AB (ref 4.40–5.90)
RDW: 13.7 % (ref 11.5–14.5)
WBC: 13.3 10*3/uL — AB (ref 3.8–10.6)

## 2016-01-07 LAB — BASIC METABOLIC PANEL
Anion gap: 10 (ref 5–15)
BUN: 21 mg/dL — ABNORMAL HIGH (ref 6–20)
CALCIUM: 8.8 mg/dL — AB (ref 8.9–10.3)
CHLORIDE: 104 mmol/L (ref 101–111)
CO2: 25 mmol/L (ref 22–32)
CREATININE: 0.79 mg/dL (ref 0.61–1.24)
GFR calc non Af Amer: >60 mL/min (ref >60)
GLUCOSE: 136 mg/dL — AB (ref 65–99)
Potassium: 4.2 mmol/L (ref 3.5–5.1)
Sodium: 139 mmol/L (ref 135–145)

## 2016-01-07 LAB — TROPONIN I
TROPONIN I: 0.04 ng/mL — AB (ref <0.03)
TROPONIN I: 0.03 ng/mL — AB (ref <0.03)

## 2016-01-07 LAB — TYPE AND SCREEN
ABO/RH(D): A POS
ANTIBODY SCREEN: NEGATIVE

## 2016-01-07 MED ORDER — POTASSIUM CHLORIDE CRYS ER 10 MEQ PO TBCR
10.0000 meq | EXTENDED_RELEASE_TABLET | Freq: Every day | ORAL | Status: DC
  Administered 2016-01-07 – 2016-01-10 (×4): 10 meq via ORAL
  Filled 2016-01-07 (×4): qty 1

## 2016-01-07 MED ORDER — FUROSEMIDE 40 MG PO TABS
80.0000 mg | ORAL_TABLET | Freq: Every day | ORAL | Status: DC
  Administered 2016-01-07 – 2016-01-08 (×2): 80 mg via ORAL
  Filled 2016-01-07 (×2): qty 2

## 2016-01-07 NOTE — Evaluation (Signed)
Occupational Therapy Evaluation Patient Details Name: KAIYA MCGREGOR MRN: [id number] DOB: [birth year] Today's Date: 01/07/2016    History of Present Illness Pt is an 82 y.o. male with a known history of Aortic valve stenosis with status post bioprosthetic valve, coronary artery disease, CABG, systolic and diastolic heart failure, hypertension, GERD, hyperlipidemia , nonischemic cardiomyopathy presented to the emergency room with fall. Patient lost balance and fell down on the driveway of his home . He was trying to roll the garbage cans down the driveway slope, he slipped and fell down and landed on left hip.  Pt is s/p anterior L THA.   Clinical Impression    Pt. Is a 82 y.o. Male who was admitted to [facility] for an anterior L THA  After falling at home outside. He has a hx of Parkinson's disease and his son reported increase in demential and hallucinations and thinks his wife is having an affair. No behaviors related to this demonstrated during eval. Pt. presents with pain, limited ROM, weakness, limited activity tolerance, and impaired functional mobility for ADLs. Pt. Could benefit from skilled OT services for ADL and A/E retraining, and functional mobility for ADLs in order to improve overall ADL functioning, and return to PLOF.  Rec OT HH after discharge.    Follow Up Recommendations  Home health OT    Equipment Recommendations  Tub/shower seat    Recommendations for Other Services       Precautions / Restrictions Precautions Precautions: Anterior Hip;Fall Precaution Booklet Issued: Yes (comment) Precaution Comments: BUE and hand tremors moderate from Parkinson's Restrictions Weight Bearing Restrictions: Yes LLE Weight Bearing: Weight bearing as tolerated      Mobility Bed Mobility Overal bed mobility: Needs Assistance Bed Mobility: Rolling;Supine to Sit;Sit to Supine Rolling: Min assist   Supine to sit: Min assist Sit to supine: Min assist   General bed mobility  comments: Bed mobility training using log roll technique to R side of bed with min A and mod verbal cues for sequencing   Transfers Overall transfer level: Needs assistance Equipment used: Rolling walker (2 wheeled) Transfers: Sit to/from Stand Sit to Stand: Min guard              Balance Overall balance assessment: Needs assistance   Sitting balance-Leahy Scale: Good       Standing balance-Leahy Scale: Fair                              ADL Overall ADL's : Needs assistance/impaired Eating/Feeding: Moderate assistance;Set up Eating/Feeding Details (indicate cue type and reason): educated in weighted utensils, built up bowls and dycem to help decrease tremors with feeding Grooming: Wash/dry hands;Wash/dry face;Oral care;Applying deodorant;Brushing hair;Set up;Cueing for sequencing           Upper Body Dressing : Minimal assistance;Set up   Lower Body Dressing: Moderate assistance;Cueing for sequencing;Sit to/from stand                       Vision     Perception     Praxis      Pertinent Vitals/Pain Pain Assessment: 0-10 Pain Score: 6  Pain Location: L hip Pain Descriptors / Indicators: Discomfort;Aching Pain Intervention(s): Limited activity within patient's tolerance;Monitored during session (offered to have NSG give meds for pain be he declined)     Hand Dominance Right   Extremity/Trunk Assessment Upper Extremity Assessment Upper Extremity Assessment: Overall WFL for tasks assessed  Lower Extremity Assessment Lower Extremity Assessment: Defer to PT evaluation LLE Deficits / Details: L hip flex 3/5, L knee flex/ext 4/5 LLE Sensation:  (Light touch and proprioception grossly intact to BLEs)       Communication Communication Communication: No difficulties   Cognition Arousal/Alertness: Awake/alert Behavior During Therapy: WFL for tasks assessed/performed Overall Cognitive Status: History of cognitive impairments - at  baseline                     General Comments       Exercises   Other Exercises Other Exercises: Static standing balance training with feet apart without UE assist   Shoulder Instructions      Home Living Family/patient expects to be discharged to:: Private residence Living Arrangements: Spouse/significant other Available Help at Discharge: Family Type of Home: House Home Access: Level entry     Home Layout: One level     Bathroom Shower/Tub: English as a second language teacher characteristics: Door Biochemist, clinical: Handicapped height Bathroom Accessibility: Yes   Home Equipment: None          Prior Functioning/Environment Level of Independence: Independent        Comments: Wife helped with getting shirt over head and he was able to do everything else for bathing and dressing.  Son reports recent increase in dementia and thinks his wife is having an affair.        OT Problem List: Decreased strength;Decreased range of motion;Decreased activity tolerance;Decreased knowledge of precautions;Pain   OT Treatment/Interventions: Self-care/ADL training;Patient/family education;DME and/or AE instruction    OT Goals(Current goals can be found in the care plan section) Acute Rehab OT Goals Patient Stated Goal: "to keep doing more for myself" OT Goal Formulation: With patient/family Time For Goal Achievement: 01/21/16 Potential to Achieve Goals: Good ADL Goals Pt Will Perform Lower Body Dressing: with set-up;with min assist;with adaptive equipment;sit to/from stand Pt Will Transfer to Toilet: with mod assist;bedside commode (BSC over toilet and FWW)  OT Frequency: Min 1X/week   Barriers to D/C:            Co-evaluation              End of Session Equipment Utilized During Treatment: Gait belt  Activity Tolerance: Patient tolerated treatment well Patient left: in chair;with call bell/phone within reach;with chair alarm set;with family/visitor present    Time: 1030-1100 OT Time Calculation (min): 30 min Charges:  OT General Charges $OT Visit: 1 Procedure OT Evaluation $OT Eval Moderate Complexity: 1 Procedure OT Treatments $Self Care/Home Management : 8-22 mins G-Codes:     Chrys Racer, OTR/L ascom [phone] 01/07/16, 11:21 AM

## 2016-01-07 NOTE — Progress Notes (Signed)
Physical Therapy Treatment Patient Details Name: Ian [name] MRN: [id number] DOB: April 23, 1928 Today's Date: 01/07/2016    History of Present Illness Pt is an 82 y.o. male with a known history of Aortic valve stenosis with status post bioprosthetic valve, coronary artery disease, CABG, systolic and diastolic heart failure, hypertension, GERD, hyperlipidemia , nonischemic cardiomyopathy presented to the emergency room with fall. Patient lost balance and fell down on the driveway of his home . He was trying to roll the garbage cans down the driveway slope, he slipped and fell down and landed on left hip.  Pt is s/p anterior L THA.    PT Comments    Pt continues to present with deficits in strength, gait, mobility, transfers and activity tolerance but showed improvement from morning session.  Pt able to amb 40' with CGA/SBA with RW and was steady without LOB.  Minimal assistance required with bed mobility and SBA with transfers. Pt will benefit from continued skilled PT services to address these deficits for increased safety and decreased caregiver assistance upon discharge.    Follow Up Recommendations  Home health PT     Equipment Recommendations  Rolling walker with 5" wheels    Recommendations for Other Services       Precautions / Restrictions Precautions Precautions: Fall;Anterior Hip Precaution Booklet Issued: Yes (comment) Restrictions Weight Bearing Restrictions: Yes LLE Weight Bearing: Weight bearing as tolerated   Anterior hip precaution education/review provided this session with pt, pt's spouse, and a son that was not present during morning session.   Mobility  Bed Mobility Overal bed mobility: Needs Assistance Bed Mobility: Rolling;Sidelying to Sit;Sit to Sidelying Rolling: Min assist Sidelying to sit: Min assist     Sit to sidelying: Min assist General bed mobility comments: Bed mobility training using log roll technique to R side of bed with min A and mod  verbal cues for sequencing   Transfers Overall transfer level: Needs assistance Equipment used: Rolling walker (2 wheeled) Transfers: Sit to/from Stand Sit to Stand: Supervision  Transfer training this session with SBA and mod verbal and tactile cues for sequencing and proper hand placement.            Ambulation/Gait Ambulation/Gait assistance: Min guard;Supervision Ambulation Distance (Feet): 40 Feet Assistive device: Rolling walker (2 wheeled) Gait Pattern/deviations: Decreased step length - left;Decreased step length - right (Slow cadence)   Gait velocity interpretation: Below normal speed for age/gender General Gait Details: Pt steady with gait with RW including during 180 deg turns with min verbal cues for upright posture and amb closer to RW.   Stairs            Wheelchair Mobility    Modified Rankin (Stroke Patients Only)       Balance Overall balance assessment: Needs assistance   Sitting balance-Leahy Scale: Good       Standing balance-Leahy Scale: Fair                      Cognition Arousal/Alertness: Awake/alert Behavior During Therapy: WFL for tasks assessed/performed Overall Cognitive Status: History of cognitive impairments - at baseline                      Exercises Total Joint Exercises Ankle Circles/Pumps: AROM;Both;10 reps Quad Sets: AROM;Both;10 reps Gluteal Sets: AROM;10 reps;Both Towel Squeeze: AROM;10 reps;Both Short Arc Quad: AROM;Both;10 reps Heel Slides: AAROM;Both;10 reps Hip ABduction/ADduction: AROM;Both;10 reps Straight Leg Raises: AAROM;Left;10 reps Long Arc Quad: AROM;Left;10 reps Knee Flexion:  AROM;Left;10 reps Other Exercises Other Exercises: HEP education/review with pt and pt's spouse and son    General Comments        Pertinent Vitals/Pain Pain Assessment: 0-10 Pain Score: 6  Pain Location: L hip Pain Descriptors / Indicators: Discomfort Pain Intervention(s): Monitored during session  (Pt stated did not require pain medication )    Home Living                      Prior Function            PT Goals (current goals can now be found in the care plan section) Acute Rehab PT Goals Potential to Achieve Goals: Good Progress towards PT goals: Progressing toward goals    Frequency    BID      PT Plan Current plan remains appropriate    Co-evaluation             End of Session Equipment Utilized During Treatment: Gait belt;Oxygen Activity Tolerance: Patient tolerated treatment well Patient left: in chair;with call bell/phone within reach;with chair alarm set;with family/visitor present;with SCD's reapplied     Time: 1550-1640 PT Time Calculation (min) (ACUTE ONLY): 50 min  Charges:  $Therapeutic Exercise: 23-37 mins $Therapeutic Activity: 8-22 mins                    G Codes:      DRoyetta Asal PT, DPT 01/07/16, 5:09 PM

## 2016-01-07 NOTE — NC FL2 (Signed)
[place] LEVEL OF CARE SCREENING TOOL     IDENTIFICATION  Patient Name: Ian [name] Birthdate: 11-08-1928 Sex: male Admission Date (Current Location): 01/05/2016  [place] and [place] Number:  Engineering geologist and Address:  [facility], [address] [place], [place] [address]      Provider Number: [id number]  Attending Physician Name and Address:  Loletha Grayer, MD  Relative Name and Phone Number:  Tashon, Delee [phone]  [phone]     Current Level of Care: Hospital Recommended Level of Care: [place] Prior Approval Number:    Date Approved/Denied:   PASRR Number: [id number]  Discharge Plan: SNF    Current Diagnoses: Patient Active Problem List   Diagnosis Date Noted  . Femur fracture, left ([place]) 01/06/2016  . Hip fracture ([place]) 01/06/2016  . Closed fracture of neck of left femur ([place])   . Fall   . Left leg pain   . Coronary artery disease involving coronary bypass graft of native heart without angina pectoris   . Preop cardiovascular exam   . Cardiomyopathy, ischemic   . Aortic sclerosis ([place]) 10/24/2015  . Aortic atherosclerosis ([place]) 10/24/2015  . Chronic combined systolic (congestive) and diastolic (congestive) heart failure ([place])   . Hyperlipidemia   . Essential hypertension   . CAD (coronary artery disease)   . Chronic systolic CHF (congestive heart failure) ([place]) 06/21/2014  . Ischemic heart disease 06/20/2014  . Abnormal nuclear stress test   . Complete heart block ([place])   . Chronic systolic dysfunction of left ventricle   . Left ventricular dysfunction 06/17/2014  . Advanced directives, counseling/discussion 04/23/2014  . Edema 10/09/2013  . Nonsustained ventricular tachycardia ([place]) 06/24/2013  . Routine general medical examination at a health care facility 04/18/2013  . Vitamin B 12 deficiency 09/04/2012  . Constipation - functional 03/29/2012  . S/P cardiac  pacemaker procedure, Medtronic Adapta L  ADDRL1, 01/21/12 01/22/2012  . Renal artery stenosis ([place]) 01/19/2012  . Symptomatic bradycardia, increased fatigue, difficulty staying awake, complete heart block  01/19/2012  . Heart block AV second degree -- 2:1 01/19/2012  . TIA 10/13/2006  . Parkinson disease ([place]) 10/07/2006  . Essential hypertension, benign 10/07/2006  . ALLERGIC RHINITIS 10/07/2006  . GERD 10/07/2006  . DIVERTICULOSIS, COLON 10/07/2006  . OSTEOARTHRITIS 10/07/2006  . OSTEOPOROSIS 10/07/2006  . Coronary artery disease without angina pectoris 10/07/1999  . Aortic valve stenosis, acquired -- s/p AVR with Porcie Bioprosthetic AVR; By Echo in 05/2009 moderate stenosis,AVA 0.98cm. 09/19/1999    Orientation RESPIRATION BLADDER Height & Weight     Self, Place, Situation  O2 (3L) Continent Weight: 140 lb 8 oz (63.7 kg) Height:  5\' 6"  (167.6 cm)  BEHAVIORAL SYMPTOMS/MOOD NEUROLOGICAL BOWEL NUTRITION STATUS      Continent Diet  AMBULATORY STATUS COMMUNICATION OF NEEDS Skin   Limited Assist Verbally Surgical wounds                       Personal Care Assistance Level of Assistance  Bathing, Feeding, Dressing Bathing Assistance: Limited assistance Feeding assistance: Limited assistance Dressing Assistance: Limited assistance     Functional Limitations Info  Sight, Hearing, Speech Sight Info: Adequate Hearing Info: Adequate Speech Info: Adequate    SPECIAL CARE FACTORS FREQUENCY  PT (By licensed PT)  OT (By licensed OT)     PT Frequency: 5x a week  OT Frequency: 5x a week.  Contractures Contractures Info: Not present    Additional Factors Info  Code Status, Allergies Code Status Info: Full Code Allergies Info: PLAVIX CLOPIDOGREL, PRAVASTATIN, ZOCOR SIMVASTATIN, PENICILLINS            Current Medications (01/07/2016):  This is the current hospital active medication list Current Facility-Administered Medications  Medication Dose Route  Frequency Provider Last Rate Last Dose  . acetaminophen (TYLENOL) tablet 650 mg  650 mg Oral Q6H PRN Saundra Shelling, MD       Or  . acetaminophen (TYLENOL) suppository 650 mg  650 mg Rectal Q6H PRN Saundra Shelling, MD      . alum & mag hydroxide-simeth (MAALOX/MYLANTA) 200-200-20 MG/5ML suspension 30 mL  30 mL Oral Q4H PRN Hessie Knows, MD      . bisacodyl (DULCOLAX) suppository 10 mg  10 mg Rectal Daily PRN Hessie Knows, MD      . bisoprolol-hydrochlorothiazide [facility]) 5-6.25 MG per tablet 1 tablet  1 tablet Oral Daily Saundra Shelling, MD   1 tablet at 01/07/16 0954  . carbidopa-levodopa (SINEMET CR) 50-200 MG per tablet controlled release 1 tablet  1 tablet Oral 5 X Daily Loletha Grayer, MD   1 tablet at 01/07/16 0954  . carbidopa-levodopa (SINEMET IR) 25-100 MG per tablet immediate release 1 tablet  1 tablet Oral TID Saundra Shelling, MD   1 tablet at 01/07/16 0954  . clindamycin (CLEOCIN) IVPB 600 mg  600 mg Intravenous Q6H Hessie Knows, MD   600 mg at 01/07/16 0653  . diphenhydrAMINE (BENADRYL) 12.5 MG/5ML elixir 12.5-25 mg  12.5-25 mg Oral Q4H PRN Hessie Knows, MD      . docusate sodium (COLACE) capsule 100 mg  100 mg Oral BID Saundra Shelling, MD   100 mg at 01/07/16 0954  . enoxaparin (LOVENOX) injection 30 mg  30 mg Subcutaneous Q24H Hessie Knows, MD   30 mg at 01/07/16 0953  . fentaNYL (SUBLIMAZE) 100 MCG/2ML injection           . furosemide (LASIX) tablet 80 mg  80 mg Oral Daily Loletha Grayer, MD   80 mg at 01/07/16 0954  . HYDROcodone-acetaminophen (NORCO/VICODIN) 5-325 MG per tablet 1-2 tablet  1-2 tablet Oral Q4H PRN Saundra Shelling, MD   2 tablet at 01/06/16 0935  . ketorolac (TORADOL) 15 MG/ML injection 15 mg  15 mg Intravenous Q6H PRN Pavan Pyreddy, MD      . magnesium citrate solution 1 Bottle  1 Bottle Oral Once PRN Hessie Knows, MD      . magnesium hydroxide (MILK OF MAGNESIA) suspension 30 mL  30 mL Oral Daily PRN Hessie Knows, MD      . menthol-cetylpyridinium (CEPACOL) lozenge 3 mg  1  lozenge Oral PRN Hessie Knows, MD       Or  . phenol (CHLORASEPTIC) mouth spray 1 spray  1 spray Mouth/Throat PRN Hessie Knows, MD      . nitroGLYCERIN (NITROGLYN) 2 % ointment           . ondansetron (ZOFRAN) tablet 4 mg  4 mg Oral Q6H PRN Saundra Shelling, MD       Or  . ondansetron (ZOFRAN) injection 4 mg  4 mg Intravenous Q6H PRN Pavan Pyreddy, MD      . pantoprazole (PROTONIX) EC tablet 40 mg  40 mg Oral Daily Saundra Shelling, MD   40 mg at 01/07/16 0954  . polyethylene glycol (MIRALAX / GLYCOLAX) packet 17 g  17 g Oral Daily Saundra Shelling, MD   [phone]  g at 01/07/16 0952  . polyvinyl alcohol (LIQUIFILM TEARS) 1.4 % ophthalmic solution 1 drop  1 drop Both Eyes Daily Saundra Shelling, MD   1 drop at 01/07/16 0955  . vitamin B-12 (CYANOCOBALAMIN) tablet 1,000 mcg  1,000 mcg Oral Daily Saundra Shelling, MD   1,000 mcg at 01/07/16 [id number]   Facility-Administered Medications Ordered in Other Encounters  Medication Dose Route Frequency Provider Last Rate Last Dose  . DOBUTamine (DOBUTREX) 1,000 mcg/mL in dextrose 5% 250 mL infusion  5-20 mcg/kg/min (Order-Specific) Intravenous Continuous Carlena Bjornstad, MD 40.8 mL/hr at 09/20/14 1415 10 mcg/kg/min at 09/20/14 1415     Discharge Medications: Please see discharge summary for a list of discharge medications.  Relevant Imaging Results:  Relevant Lab Results:   Additional Information SSN [id number]  Ross Ludwig

## 2016-01-07 NOTE — Progress Notes (Signed)
Patient: Ian [name] / Admit Date: 01/05/2016 / Date of Encounter: 01/07/2016, 8:00 AM   Subjective: He reported chest pain postoperatively. He does not remember this and now states he felt like he was describing a prior episode of chest pain. However, he does still appear somewhat foggy from anesthesia at this time. He has not had any further chest pain overnight or into this morning. No SOB, nausea, vomiting, diaphoresis, or palpitations. EKG was checked postoperatively and found to be non acute. Has 0.5 inch nitro paste in place at this time.   Now s/p left total hip arthroplasty on 01/06/16.   Review of Systems: Review of Systems  Constitutional: Positive for malaise/fatigue. Negative for chills, diaphoresis, fever and weight loss.  HENT: Negative for congestion.   Eyes: Negative for discharge and redness.  Respiratory: Negative for cough, sputum production, shortness of breath and wheezing.   Cardiovascular: Positive for chest pain. Negative for palpitations, orthopnea, claudication, leg swelling and PND.  Gastrointestinal: Negative for abdominal pain, heartburn, nausea and vomiting.  Musculoskeletal: Positive for joint pain. Negative for falls and myalgias.  Skin: Negative for rash.  Neurological: Negative for dizziness, tingling, tremors, sensory change, speech change, focal weakness, loss of consciousness and weakness.  Endo/Heme/Allergies: Does not bruise/bleed easily.  Psychiatric/Behavioral: Negative for substance abuse. The patient is not nervous/anxious.   All other systems reviewed and are negative.   Objective: Telemetry: Paced, 60'1 bpm Physical Exam: Blood pressure (!) 121/45, pulse (!) 59, temperature 98.5 F (36.9 C), temperature source Oral, resp. rate 16, height 5\' 6"  (1.676 m), weight 140 lb 8 oz (63.7 kg), SpO2 97 %. Body mass index is 22.68 kg/m. General: Well developed, well nourished, in no acute distress. Head: Normocephalic, atraumatic, sclera  non-icteric, no xanthomas, nares are without discharge. Neck: Negative for carotid bruits. JVP not elevated. Lungs: Clear bilaterally to auscultation without wheezes, rales, or rhonchi. Breathing is unlabored. Heart: RRR S1 S2, II/VI systolic murmur RUSB radiating to the neck, no rubs, or gallops.  Abdomen: Soft, non-tender, non-distended with normoactive bowel sounds. No rebound/guarding. Extremities: No clubbing or cyanosis. No edema. Distal pedal pulses are 2+ and equal bilaterally. Neuro: Alert and oriented X 3. Moves all extremities spontaneously. Psych:  Responds to questions appropriately with a normal affect. Does not remember chest pain episode on 9/19.    Intake/Output Summary (Last 24 hours) at 01/07/16 0800 Last data filed at 01/07/16 [id number]  Gross per 24 hour  Intake           1033.5 ml  Output             1040 ml  Net             -6.5 ml    Inpatient Medications:  . bisoprolol-hydrochlorothiazide  1 tablet Oral Daily  . carbidopa-levodopa  1 tablet Oral 5 X Daily  . carbidopa-levodopa  1 tablet Oral TID  . clindamycin (CLEOCIN) IV  600 mg Intravenous Q6H  . docusate sodium  100 mg Oral BID  . enoxaparin (LOVENOX) injection  30 mg Subcutaneous Q24H  . fentaNYL      . nitroGLYCERIN      . pantoprazole  40 mg Oral Daily  . polyethylene glycol  17 g Oral Daily  . polyvinyl alcohol  1 drop Both Eyes Daily  . vitamin B-12  1,000 mcg Oral Daily   Infusions:    Labs:  Recent Labs  01/05/16 2252 01/06/16 0341  NA 138 139  K 3.6 4.0  CL 97* 99*  CO2 36* 34*  GLUCOSE 108* 139*  BUN 21* 19  CREATININE 0.69 0.82  CALCIUM 9.2 9.1   No results for input(s): AST, ALT, ALKPHOS, BILITOT, PROT, ALBUMIN in the last 72 hours.  Recent Labs  01/05/16 2252 01/06/16 0341 01/07/16 0416  WBC 11.1* 17.9* 13.3*  NEUTROABS 8.9*  --   --   HGB 13.9 13.4 12.2*  HCT 39.4* 39.1* 34.6*  MCV 90.5 92.2 90.8  PLT 297 268 243    Recent Labs  01/05/16 2252  TROPONINI <0.03    Invalid input(s): POCBNP No results for input(s): HGBA1C in the last 72 hours.   Weights: Filed Weights   01/05/16 2154 01/06/16 0256  Weight: 141 lb (64 kg) 140 lb 8 oz (63.7 kg)     Radiology/Studies:  Dg Chest 1 View  Result Date: 01/05/2016 CLINICAL DATA:  Fractured left hip.  Preoperative chest x-ray. EXAM: CHEST 1 VIEW COMPARISON:  10/30/2015 FINDINGS: Permanent left-sided pacemaker is stable. The pacer wires are unchanged. The heart is normal in size. Marked tortuosity and calcification of the thoracic aorta. Stable surgical changes from valve replacement surgery. The lungs are clear. No pleural effusion or pneumothorax. The bony thorax is intact. IMPRESSION: No acute cardiopulmonary findings. Electronically Signed   By: Marijo Sanes M.D.   On: 01/05/2016 22:50   Dg Knee Complete 4 Views Left  Result Date: 01/05/2016 CLINICAL DATA:  [place] tonight.  Injured left leg EXAM: LEFT KNEE - COMPLETE 4+ VIEW COMPARISON:  None. FINDINGS: The joint spaces are fairly well maintained for the patient's age. No significant degenerative changes, acute fracture or osteochondral lesion. No joint effusion. Moderate atherosclerotic calcifications. IMPRESSION: No acute bony findings, significant degenerative changes or joint effusion. Electronically Signed   By: Marijo Sanes M.D.   On: 01/05/2016 22:49   Dg Hip Operative Unilat With Pelvis Left  Result Date: 01/06/2016 CLINICAL DATA:  Fixation of left femoral neck fracture. EXAM: OPERATIVE left HIP (WITH PELVIS IF PERFORMED) 1 VIEWS TECHNIQUE: Fluoroscopic spot image(s) were submitted for interpretation post-operatively. COMPARISON:  Radiographs 01/05/2016 FINDINGS: Bipolar hip prosthesis in good position without complicating features. The tip is not covered. IMPRESSION: Bipolar hip prosthesis is in place without complicating features. The tip of the prosthesis is not covered. Electronically Signed   By: Marijo Sanes M.D.   On: 01/06/2016 22:17   Dg  Hip Unilat W Or W/o Pelvis 2-3 Views Left  Result Date: 01/06/2016 CLINICAL DATA:  Postoperative pain after left anterior hip surgery. EXAM: DG HIP (WITH OR WITHOUT PELVIS) 2-3V LEFT COMPARISON:  01/05/2016 FINDINGS: Interval postoperative changes with left hip hemiarthroplasty using non cemented components. Components appear well seated. No evidence of acute fracture or dislocation. Skin clips and soft tissue gas are consistent with recent surgery. Pelvis appears intact. Surgical clips in the pelvis consistent with mesh hernia repair. Vascular calcifications. Degenerative changes in the lower lumbar spine. IMPRESSION: Left hip hemiarthroplasty. Components appear well seated. No acute fracture or dislocation. Electronically Signed   By: Lucienne Capers M.D.   On: 01/06/2016 23:11   Dg Hip Unilat With Pelvis 2-3 Views Left  Result Date: 01/05/2016 CLINICAL DATA:  [place] at home today. EXAM: DG HIP (WITH OR WITHOUT PELVIS) 2-3V LEFT COMPARISON:  None. FINDINGS: There is an impacted and slightly displaced high femoral neck fracture on the left. The right hip is intact. The pubic symphysis and SI joints are intact. Surgical changes from a hernia repair are noted. Extensive vascular calcifications. IMPRESSION:  Impacted and mildly displaced left femoral neck fracture. Electronically Signed   By: Marijo Sanes M.D.   On: 01/05/2016 22:47     Assessment and Plan  Principal Problem:   Femur fracture, left (HCC) Active Problems:   Hip fracture (HCC)   Closed fracture of neck of left femur (HCC)   Fall   Left leg pain   Coronary artery disease involving coronary bypass graft of native heart without angina pectoris   Preop cardiovascular exam   Cardiomyopathy, ischemic    1. Post operative chest pain: -Reported chest pain in PACU, EKG non acute, nitro paste applied, no further chest pain -He does not recall chest pain, though he was coming out of anesthesia -Check troponin  -Discontinue nitro paste as  he is pain free at this time -Should he redevelop chest pain could use Imdur for medical management pending negative troponin  -Continue current medical management   2. Fall/fracture left hip s/p total left hip arthroplasty: -Per ortho  3. CAD s/p CABG: -As above  4. Chronic systolic CHF/symptomatic bradycardia s/p MDT BiV-PPM: -He does not appear volume overloaded -Continue current medications  5. RRA: -Stable  6. Parkinson's disease: -Per IM  Signed, Christell Faith, PA-C [facility] Pager: [phone] 01/07/2016, 8:00 AM   I have seen, examined and evaluated the patient this PM along with Mr. Purcell Mouton.  After reviewing all the available data and chart, we discussed the patients laboratory, study & physical findings as well as symptoms in detail. I agree with his findings, examination as well as impression recommendations as per our discussion.    Patient is doing well today. He does not recall having any chest pain, but his wife says if he mentioned that he may very well have had some. But he is not had any further pain. He did well walking today.  I agree with taking off the nitroglycerin paste for now. Troponin level this morning was just slightly above upper limit of normal, follow-up was normal. I doubt that this is ACS related. For now, which [place] monitor. I don't think any further cardiac evaluation and treatment is necessary. We will follow him in the morning to ensure that he is stable, but anticipate that this should not be cardiac issues that keep him from being discharged as originally planned. Would continue current cardiac medications.  He seems pretty much euvolemic. No arrhythmias.  We will check him again tomorrow. If stable, will likely sign off.   Glenetta Hew, M.D., M.S. Interventional Cardiologist   Pager # [phone] Phone # [phone] [address] [place], [place] [address]

## 2016-01-07 NOTE — Progress Notes (Signed)
Patient ID: Ian [name], male   DOB: 1928-09-30, 82 y.o.   MRN: [id number]  [facility] PROGRESS NOTE  ISAIC BACHER [id number] DOB: Dec 14, 1928 DOA: 01/05/2016 PCP: Viviana Simpler, MD  HPI/Subjective: Patient seen earlier this morning. Complaint anesthesia about chest pain after procedure. He does not remember this. No chest pain currently. Actually this morning he did not have any pain in his hip.  Objective: Vitals:   01/07/16 0956 01/07/16 1157  BP: (!) 132/53 (!) 137/58  Pulse: 60 63  Resp:  18  Temp:  98.2 F (36.8 C)    Filed Weights   01/05/16 2154 01/06/16 0256  Weight: 64 kg (141 lb) 63.7 kg (140 lb 8 oz)    ROS: Review of Systems  Constitutional: Negative for chills and fever.  Eyes: Negative for blurred vision.  Respiratory: Positive for shortness of breath. Negative for cough.   Cardiovascular: Negative for chest pain.  Gastrointestinal: Negative for abdominal pain, constipation, diarrhea, nausea and vomiting.  Genitourinary: Negative for dysuria.  Musculoskeletal: Positive for joint pain.  Neurological: Negative for dizziness and headaches.   Exam: Physical Exam  Constitutional: He is oriented to person, place, and time.  HENT:  Nose: No mucosal edema.  Mouth/Throat: No oropharyngeal exudate or posterior oropharyngeal edema.  Eyes: Conjunctivae, EOM and lids are normal. Pupils are equal, round, and reactive to light.  Neck: No JVD present. Carotid bruit is not present. No edema present. No thyroid mass and no thyromegaly present.  Cardiovascular: S1 normal and S2 normal.  Exam reveals no gallop.   Murmur heard.  Systolic murmur is present with a grade of 2/6  Pulses:      Dorsalis pedis pulses are 2+ on the right side, and 2+ on the left side.  Respiratory: No respiratory distress. He has no wheezes. He has no rhonchi. He has no rales.  GI: Soft. Bowel sounds are normal. There is no tenderness.  Musculoskeletal:       Right ankle: He  exhibits no swelling.       Left ankle: He exhibits no swelling.  Lymphadenopathy:    He has no cervical adenopathy.  Neurological: He is alert and oriented to person, place, and time. No cranial nerve deficit.  Positive tremor.  Skin: Skin is warm. No rash noted. Nails show no clubbing.  Psychiatric: He has a normal mood and affect.      Data Reviewed: Basic Metabolic Panel:  Recent Labs Lab 01/05/16 2252 01/06/16 0341 01/07/16 0416  NA 138 139 139  K 3.6 4.0 4.2  CL 97* 99* 104  CO2 36* 34* 25  GLUCOSE 108* 139* 136*  BUN 21* 19 21*  CREATININE 0.69 0.82 0.79  CALCIUM 9.2 9.1 8.8*   CBC:  Recent Labs Lab 01/05/16 2252 01/06/16 0341 01/07/16 0416  WBC 11.1* 17.9* 13.3*  NEUTROABS 8.9*  --   --   HGB 13.9 13.4 12.2*  HCT 39.4* 39.1* 34.6*  MCV 90.5 92.2 90.8  PLT 297 268 243   Cardiac Enzymes:  Recent Labs Lab 01/05/16 2252 01/07/16 0416 01/07/16 1241  TROPONINI <0.03 0.04* 0.03*     Studies: Dg Chest 1 View  Result Date: 01/05/2016 CLINICAL DATA:  Fractured left hip.  Preoperative chest x-ray. EXAM: CHEST 1 VIEW COMPARISON:  10/30/2015 FINDINGS: Permanent left-sided pacemaker is stable. The pacer wires are unchanged. The heart is normal in size. Marked tortuosity and calcification of the thoracic aorta. Stable surgical changes from valve replacement surgery. The lungs are clear.  No pleural effusion or pneumothorax. The bony thorax is intact. IMPRESSION: No acute cardiopulmonary findings. Electronically Signed   By: Marijo Sanes M.D.   On: 01/05/2016 22:50   Dg Knee Complete 4 Views Left  Result Date: 01/05/2016 CLINICAL DATA:  [place] tonight.  Injured left leg EXAM: LEFT KNEE - COMPLETE 4+ VIEW COMPARISON:  None. FINDINGS: The joint spaces are fairly well maintained for the patient's age. No significant degenerative changes, acute fracture or osteochondral lesion. No joint effusion. Moderate atherosclerotic calcifications. IMPRESSION: No acute bony  findings, significant degenerative changes or joint effusion. Electronically Signed   By: Marijo Sanes M.D.   On: 01/05/2016 22:49   Dg Hip Operative Unilat With Pelvis Left  Result Date: 01/06/2016 CLINICAL DATA:  Fixation of left femoral neck fracture. EXAM: OPERATIVE left HIP (WITH PELVIS IF PERFORMED) 1 VIEWS TECHNIQUE: Fluoroscopic spot image(s) were submitted for interpretation post-operatively. COMPARISON:  Radiographs 01/05/2016 FINDINGS: Bipolar hip prosthesis in good position without complicating features. The tip is not covered. IMPRESSION: Bipolar hip prosthesis is in place without complicating features. The tip of the prosthesis is not covered. Electronically Signed   By: Marijo Sanes M.D.   On: 01/06/2016 22:17   Dg Hip Unilat W Or W/o Pelvis 2-3 Views Left  Result Date: 01/06/2016 CLINICAL DATA:  Postoperative pain after left anterior hip surgery. EXAM: DG HIP (WITH OR WITHOUT PELVIS) 2-3V LEFT COMPARISON:  01/05/2016 FINDINGS: Interval postoperative changes with left hip hemiarthroplasty using non cemented components. Components appear well seated. No evidence of acute fracture or dislocation. Skin clips and soft tissue gas are consistent with recent surgery. Pelvis appears intact. Surgical clips in the pelvis consistent with mesh hernia repair. Vascular calcifications. Degenerative changes in the lower lumbar spine. IMPRESSION: Left hip hemiarthroplasty. Components appear well seated. No acute fracture or dislocation. Electronically Signed   By: Lucienne Capers M.D.   On: 01/06/2016 23:11   Dg Hip Unilat With Pelvis 2-3 Views Left  Result Date: 01/05/2016 CLINICAL DATA:  [place] at home today. EXAM: DG HIP (WITH OR WITHOUT PELVIS) 2-3V LEFT COMPARISON:  None. FINDINGS: There is an impacted and slightly displaced high femoral neck fracture on the left. The right hip is intact. The pubic symphysis and SI joints are intact. Surgical changes from a hernia repair are noted. Extensive vascular  calcifications. IMPRESSION: Impacted and mildly displaced left femoral neck fracture. Electronically Signed   By: Marijo Sanes M.D.   On: 01/05/2016 22:47    Scheduled Meds: . bisoprolol-hydrochlorothiazide  1 tablet Oral Daily  . carbidopa-levodopa  1 tablet Oral 5 X Daily  . carbidopa-levodopa  1 tablet Oral TID  . docusate sodium  100 mg Oral BID  . enoxaparin (LOVENOX) injection  30 mg Subcutaneous Q24H  . furosemide  80 mg Oral Daily  . pantoprazole  40 mg Oral Daily  . polyethylene glycol  17 g Oral Daily  . polyvinyl alcohol  1 drop Both Eyes Daily  . potassium chloride  10 mEq Oral Daily  . vitamin B-12  1,000 mcg Oral Daily    Assessment/Plan:  1. Postoperative chest pain. Not concerned about the borderline troponin which normalized now. 2. Parkinson's disease. Continue Sinemet long-acting and short acting 3. History of coronary artery disease and chronic systolic congestive heart failure with moderate aortic stenosis. No signs of congestive heart failure at this point. Stop IV fluids and re-start his Lasix home dose. 4. History of TIA 5. GERD on Protonix 6. Left hip fracture status post operative  repair. Try to switch to Tylenol as quickly as possible. Will likely go out to rehabilitation on Friday  Code Status:     Code Status Orders        Start     Ordered   01/06/16 0245  Full code  Continuous     01/06/16 0244    Code Status History    Date Active Date Inactive Code Status Order ID Comments User Context   10/30/2015  9:55 AM 10/30/2015  8:33 PM Full Code [id number]  Thompson Grayer, MD Inpatient   06/10/2015 11:26 AM 10/30/2015  9:55 AM DNR [id number]  Newt Minion, RN Outpatient   06/20/2014  5:10 PM 06/21/2014  5:22 PM Full Code [id number]  Lorretta Harp, MD Inpatient   01/21/2012  6:49 PM 01/22/2012  3:33 PM Full Code [id number]  Willette Pa, RN Inpatient    Advance Directive Documentation   Flowsheet Row Most Recent Value  Type of Advance Directive   Healthcare Power of Attorney  Pre-existing out of facility DNR order (yellow form or pink MOST form)  No data  "MOST" Form in Place?  No data      Disposition Plan: Likely rehabilitation on Friday  Consultants:  Cardiology  Orthopedic surgery  Time spent: 28 minutes  [place], [place]

## 2016-01-07 NOTE — Care Management Note (Signed)
Case Management Note  Patient Details  Name: JUANLUIS GUASTELLA MRN: [id number] Date of Birth: 15-Nov-1928  Subjective/Objective:                   Met with patient, son, and patient's wife to discuss discharge planning. They all want patient to go to SNF for rehab- first choice is [facility] because Dr. Santiago Glad is their PCP. Apparently [facility] has a bed available for patient. PT is not recommending SNF at this time. Patient is from home where he was independent with mobility despite his Parkinson's disease. He has not DME in the home. He is not on home O2 and did not have an incentive spirometer available at time I rounded. He uses [facility] for medications [phone]. Action/Plan:   I explained incentive spirometer use to get him off O2 as it is acute. Patient was able to demonstrate IS use and frequency of use. Order obtained from Dr. Menz/Dr. Leslye Peer notified. I have updated Education officer, museum about request for [facility]. I have notified patient's son that if PT does not feel SNF is appropriate they will need to pick a home health agency from list I provided to them during my visit- they agree. I explained that insurance would not cover SNF if it is not medically necessary. RNCM will need to arrange home health PT and possibly OT. RNCM will need to call Lovenox 63m SQ daily for 14 days per Dr. MRudene Christianswhen discharge disposition is concluded to home. RNCM will need to obtain front-wheeled walker and possibly 3 in 1 commode and shower chair- patient is aware that insurance may not cover the 3 in 1 or the shower chair.   Expected Discharge Date:                  Expected Discharge Plan:     In-House Referral:     Discharge planning Services  CM Consult  Post Acute Care Choice:  Durable Medical Equipment, Home Health Choice offered to:  Patient, Spouse  DME Arranged:    [place] Agency[place]  [place]  [facility]     Status of Service:  In process, will  continue to follow  If discussed at Long Length of Stay Meetings, dates discussed:    Additional Comments:  AMarshell Garfinkel RN 01/07/2016, 12:31 PM

## 2016-01-07 NOTE — Progress Notes (Signed)
Subjective: 1 Day Post-Op Procedure(s) (LRB): TOTAL HIP ARTHROPLASTY ANTERIOR APPROACH (Left) Patient reports pain as mild.   Patient is well, and has had no acute complaints or problems Denies any CP, SOB, ABD pain. We will continue therapy today.  Plan is to go Rehab after hospital stay.  Objective: Vital signs in last 24 hours: Temp:  [97.5 F (36.4 C)-98.5 F (36.9 C)] 98.5 F (36.9 C) (09/20 0401) Pulse Rate:  [59-93] 59 (09/20 0401) Resp:  [14-28] 16 (09/20 0401) BP: (98-151)/(42-65) 121/45 (09/20 0401) SpO2:  [90 %-99 %] 97 % (09/20 0401)  Intake/Output from previous day: 09/19 0701 - 09/20 0700 In: 1033.5 [I.V.:983.5; IV Piggyback:50] Out: [id number] [Urine:590; Blood:450] Intake/Output this shift: No intake/output data recorded.   Recent Labs  01/05/16 2252 01/06/16 0341 01/07/16 0416  HGB 13.9 13.4 12.2*    Recent Labs  01/06/16 0341 01/07/16 0416  WBC 17.9* 13.3*  RBC 4.24* 3.81*  HCT 39.1* 34.6*  PLT 268 243    Recent Labs  01/05/16 2252 01/06/16 0341  NA 138 139  K 3.6 4.0  CL 97* 99*  CO2 36* 34*  BUN 21* 19  CREATININE 0.69 0.82  GLUCOSE 108* 139*  CALCIUM 9.2 9.1   No results for input(s): LABPT, INR in the last 72 hours.  EXAM General - Patient is Alert, Appropriate and Oriented Extremity - Neurovascular intact Sensation intact distally Intact pulses distally Dorsiflexion/Plantar flexion intact No cellulitis present Compartment soft Dressing - dressing C/D/I and no drainage Motor Function - intact, moving foot and toes well on exam.   Past Medical History:  Diagnosis Date  . Allergy   . Aortic valve stenosis, acquired    a. s/p Porcine AVR 2001;  b. 09/2014 [place] Echo: mod-sev bioprosthetic AS w/ minimal change in CO and only mild increase in gradients w/ dobutamine.   Marland Kitchen CAD (coronary artery disease)    a. s/p CABG with LIMA-LAD in 2001 with AVR. b. BMS to LCx 8/08. c. Abnl nuc/dec EF 05/2014 - s/p cath with patent LIMA to  LAD, patent dominant right, mild LCx disease, patent stent. Med rx.  . Chronic combined systolic (congestive) and diastolic (congestive) heart failure (HCC)    a. EF 50-55% in 02/2013;  b. 05/2014 Echo: EF 20-25%, Gr1 DD.  Marland Kitchen Diverticulitis   . ED (erectile dysfunction)   . Essential hypertension   . GERD (gastroesophageal reflux disease)   . Hyperlipidemia   . MALT lymphoma ([place])   . Nonischemic cardiomyopathy ([place])    a. 05/2014 Echo: EF 20-25%-->f/u cath w/ nonobs dzs.  . Osteoarthritis   . Osteoporosis   . Parkinson's disease   . Renal artery stenosis (HCC)    a. 90% RRA stenosis previously. b. Duplex 09/2014: R renal artery 60-99%, L renal artery 1-59%.  . S/P cardiac pacemaker procedure, Medtronic Adapta L  ADDRL1, 01/21/12    a. 01/2012 s/p MDT [place] PPM (ser # [id number]).  . Symptomatic bradycardia    a. 01/2012 s/p MDT [place] PPM (ser # [id number]).  Marland Kitchen TIA (transient ischemic attack) 06/08   a. 09/2006    Assessment/Plan:   1 Day Post-Op Procedure(s) (LRB): TOTAL HIP ARTHROPLASTY ANTERIOR APPROACH (Left) Principal Problem:   Femur fracture, left (HCC) Active Problems:   Hip fracture (HCC)   Closed fracture of neck of left femur ([place])   Fall   Left leg pain   Coronary artery disease involving coronary bypass graft of native heart without angina pectoris  Preop cardiovascular exam   Cardiomyopathy, ischemic  Estimated body mass index is 22.68 kg/m as calculated from the following:   Height as of this encounter: 5\' 6"  (1.676 m).   Weight as of this encounter: 63.7 kg (140 lb 8 oz). Advance diet Up with therapy  Needs BM Acute post op blood loss anemia-recheck labs in the am   DVT Prophylaxis - Lovenox, Foot Pumps and TED hose Weight-Bearing as tolerated to left leg   T. Rachelle Hora, PA-C [place] 01/07/2016, 8:11 AM

## 2016-01-07 NOTE — Clinical Social Work Note (Addendum)
MSW spoke with patient and his family they would like to go to [facility] for short term rehab.  MSW explained to patient and his family that MSW will send his information to other SNFs in [facility] as well in case their preferred SNF can not accept patient.  MSW was given permission to begin bed search process for SNF placement.  12:00pm  MSW spoke to [facility] who said they can accept patient on Friday and will have a bed available for him.  MSW updated patient's family who were at bedside and were appreciative of information given.  Jones Broom. Blondell Laperle, MSW [phone]  Mon-Fri 8a-4:30p 01/07/2016 11:47 AM

## 2016-01-07 NOTE — Progress Notes (Signed)
Anesthesiology :  Chest pressure relieved in PACU.  EKG showed no change from preop with no acute changes in ST segments.  Patient resting comfortably in bed.  Hospitalist was contacted by PACU nurse for follow up

## 2016-01-07 NOTE — Evaluation (Signed)
Physical Therapy Evaluation Patient Details Name: Ian [name] MRN: [id number] DOB: 11/02/1928 Today's Date: 01/07/2016   History of Present Illness  Pt is an 82 y.o. male with a known history of Aortic valve stenosis with status post bioprosthetic valve, coronary artery disease, CABG, systolic and diastolic heart failure, hypertension, GERD, hyperlipidemia , nonischemic cardiomyopathy presented to the emergency room with fall. Patient lost balance and fell down on the driveway of his home . He was trying to roll the garbage cans down the driveway slope, he slipped and fell down and landed on left hip resulting in a L femoral neck fracture.  Pt is s/p anterior L THA.  Clinical Impression  Pt presents with deficits in strength, gait, balance, mobility, transfers, and activity tolerance.  Pt's troponin measured at 0.04.  Spoke to Dr. Leslye Peer who stated  OK for pt to participate in PT services.  Pt required min A with bed mobility, CGA with transfers, and ambulated 30' with CGA and a RW.  HR remained in the low 60s throughout session with SpO2 97-98% on 2LO2/min with no SOB and no signs of distress.  Pt will benefit from continued PT services to address the above deficits for a safe discharge with decreased caregiver assistance.     Follow Up Recommendations Home health PT    Equipment Recommendations  Rolling walker with 5" wheels    Recommendations for Other Services       Precautions / Restrictions Precautions Precautions: Anterior Hip;Fall Precaution Booklet Issued: Yes (comment) Restrictions Weight Bearing Restrictions: Yes LLE Weight Bearing: Weight bearing as tolerated   Patient/family education provided on LLE anterior hip precautions     Mobility  Bed Mobility Overal bed mobility: Needs Assistance Bed Mobility: Rolling;Supine to Sit;Sit to Supine Rolling: Min assist   Supine to sit: Min assist Sit to supine: Min assist   General bed mobility comments: Bed mobility  training using log roll technique to R side of bed with min A and mod verbal cues for sequencing   Transfers Overall transfer level: Needs assistance Equipment used: Rolling walker (2 wheeled) Transfers: Sit to/from Stand Sit to Stand: Min guard            Ambulation/Gait Ambulation/Gait assistance: Min guard Ambulation Distance (Feet): 30 Feet Assistive device: Rolling walker (2 wheeled) Gait Pattern/deviations: Decreased step length - right;Decreased step length - left;Trunk flexed (Slow cadence)   Gait velocity interpretation: Below normal speed for age/gender    Stairs            Wheelchair Mobility    Modified Rankin (Stroke Patients Only)       Balance Overall balance assessment: Needs assistance   Sitting balance-Leahy Scale: Good       Standing balance-Leahy Scale: Fair                               Pertinent Vitals/Pain Pain Assessment: 0-10 Pain Score: 1  Pain Location: L hip Pain Descriptors / Indicators: Discomfort Pain Intervention(s): Monitored during session (Pt reports pain not bad enough to need pain medication)    Home Living Family/patient expects to be discharged to:: Private residence Living Arrangements: Spouse/significant other   Type of Home: House Home Access: Level entry     Home Layout: One level Home Equipment: None      Prior Function Level of Independence: Independent         Comments: Ind with amb without AD community distances,  some assistance from spouse with dressing, no other falls in last year.     Hand Dominance        Extremity/Trunk Assessment   Upper Extremity Assessment: Overall WFL for tasks assessed           Lower Extremity Assessment: Generalized weakness;LLE deficits/detail   LLE Deficits / Details: L hip flex 3/5, L knee flex/ext 4/5     Communication   Communication: No difficulties  Cognition Arousal/Alertness: Awake/alert Behavior During Therapy: WFL for tasks  assessed/performed Overall Cognitive Status: History of cognitive impairments - at baseline                      General Comments      Exercises Other Exercises Other Exercises: Static standing balance training with feet apart without UE assist   Assessment/Plan    PT Assessment Patient needs continued PT services  PT Problem List Decreased strength;Decreased activity tolerance;Decreased balance;Decreased mobility          PT Treatment Interventions DME instruction;Gait training;Stair training;Functional mobility training;Therapeutic activities;Therapeutic exercise;Balance training;Neuromuscular re-education;Patient/family education    PT Goals (Current goals can be found in the Care Plan section)  Acute Rehab PT Goals Patient Stated Goal: To walk a couple of blocks PT Goal Formulation: With patient Time For Goal Achievement: 01/20/16 Potential to Achieve Goals: Good    Frequency BID   Barriers to discharge        Co-evaluation               End of Session Equipment Utilized During Treatment: Gait belt;Oxygen Activity Tolerance: Patient tolerated treatment well Patient left: in chair;with chair alarm set;with family/visitor present;Other (comment);with call bell/phone within reach (OT entered room to begin OT eval)           Time: 1005-1036 PT Time Calculation (min) (ACUTE ONLY): 31 min   Charges:   PT Evaluation $PT Eval Low Complexity: 1 Procedure PT Treatments $Therapeutic Activity: 8-22 mins   PT G Codes:        DRoyetta Asal PT, DPT 01/07/16, 11:05 AM

## 2016-01-07 NOTE — Clinical Social Work Note (Signed)
Clinical Social Work Assessment  Patient Details  Name: Ian [name] MRN: [id number] Date of Birth: 1928/12/18  Date of referral:  01/07/16               Reason for consult:  Facility Placement                Permission sought to share information with:  Family Supports, Customer service manager Permission granted to share information::  Yes, Verbal Permission Granted  Name::     [place], [place] [phone]  [phone]   Agency::  SNF admissions  Relationship::     Contact Information:     Housing/Transportation Living arrangements for the past 2 months:  Single Family Home Source of Information:  Patient, Adult Children, Spouse Patient Interpreter Needed:  None Criminal Activity/Legal Involvement Pertinent to Current Situation/Hospitalization:  No - Comment as needed Significant Relationships:  Adult Children, Spouse Lives with:  Spouse Do you feel safe going back to the place where you live?  No Need for family participation in patient care:  No (Coment)  Care giving concerns:  Patient's family feels he needs SNF placement before he is able to return back home due to patient living with his wife and she states she is not able to provide support for him at his current state.   Social Worker assessment / plan:  Patient is an 82 year old male who is married and lives with his wife.  Patient is alert and oriented x4 and able to express his feelings.  Patient's family was at bedside, MSW explained to patient and his family the role of MSW and process for snf bed placement.  Patient's family were informed at how insurance will pay for the stay at SNF and what to expect at facility.  Patient's family stated they can not take patient back home until he is stronger because he only lives with his wife and she is not able to help him by herself.  Patient and family are agreeable to having MSW begin bed search process.  Employment status:  Retired Forensic scientist:   Medicare PT Recommendations:  Home with [place] / Referral to community resources:  [place]  Patient/Family's Response to care:  Patient and family would like SNF for short term rehab.  Patient/Family's Understanding of and Emotional Response to Diagnosis, Current Treatment, and Prognosis: Patient and his family are hopeful he will make a quick recovery so he can return back home.  Emotional Assessment Appearance:  Appears stated age Attitude/Demeanor/Rapport:    Affect (typically observed):  Calm, Pleasant, Appropriate Orientation:  Oriented to Self, Oriented to Place, Oriented to  Time, Oriented to Situation Alcohol / Substance use:  Not Applicable Psych involvement (Current and /or in the community):  No (Comment)  Discharge Needs  Concerns to be addressed:  Lack of Support Readmission within the last 30 days:  No Current discharge risk:  None Barriers to Discharge:  Continued Medical Work up   Ross Ludwig 01/07/2016, 3:14 PM

## 2016-01-08 LAB — CBC
HCT: 32.8 % — ABNORMAL LOW (ref 40.0–52.0)
HEMOGLOBIN: 11.4 g/dL — AB (ref 13.0–18.0)
MCH: 31.7 pg (ref 26.0–34.0)
MCHC: 34.8 g/dL (ref 32.0–36.0)
MCV: 91.1 fL (ref 80.0–100.0)
Platelets: 247 10*3/uL (ref 150–440)
RBC: 3.6 MIL/uL — AB (ref 4.40–5.90)
RDW: 14 % (ref 11.5–14.5)
WBC: 11.1 10*3/uL — AB (ref 3.8–10.6)

## 2016-01-08 LAB — SURGICAL PATHOLOGY

## 2016-01-08 MED ORDER — ENOXAPARIN SODIUM 40 MG/0.4ML ~~LOC~~ SOLN
40.0000 mg | SUBCUTANEOUS | Status: DC
  Administered 2016-01-09 – 2016-01-10 (×2): 40 mg via SUBCUTANEOUS
  Filled 2016-01-08 (×2): qty 0.4

## 2016-01-08 MED ORDER — BISOPROLOL FUMARATE 5 MG PO TABS
5.0000 mg | ORAL_TABLET | Freq: Every day | ORAL | Status: DC
  Administered 2016-01-09 – 2016-01-10 (×2): 5 mg via ORAL
  Filled 2016-01-08 (×2): qty 1

## 2016-01-08 MED ORDER — ENOXAPARIN SODIUM 40 MG/0.4ML ~~LOC~~ SOLN: 40.0000 mg | SUBCUTANEOUS | 0 refills | Status: DC

## 2016-01-08 NOTE — Care Management (Signed)
Anticipate discharge to skilled nursing facility 9.22.2017

## 2016-01-08 NOTE — Progress Notes (Signed)
Physical Therapy Treatment Patient Details Name: Ian [name] MRN: [id number] DOB: 01/01/1929 Today's Date: 01/08/2016    History of Present Illness Pt is an 82 y.o. male with a known history of Aortic valve stenosis with status post bioprosthetic valve, coronary artery disease, CABG, systolic and diastolic heart failure, hypertension, GERD, hyperlipidemia , nonischemic cardiomyopathy presented to the emergency room with fall. Patient lost balance and fell down on the driveway of his home . He was trying to roll the garbage cans down the driveway slope, he slipped and fell down and landed on left hip.  Pt is s/p anterior L THA.    PT Comments    Pt agreeable to PT; complains of pain in left hip as well as left lateral foot. Unable to reproduce pain in foot with palpation, movement or mobilization; however, after some discussion, pt notes episodes of severe muscles spasms, which may have contributed if not due to the mechanical fall. MD later in room also discussed with pt the possibility of pain being a side affect post surgery. Pt participates well with supine bed exercises with assist on left as needed. Pt requires Min A to mobilize Left lower extremity to and over edge of bed and slightly heavier Min A for trunk elevation. Sit to stand with Min A and cueing for safe hand placement with each stand. Pt has dizziness upon stand. Spouse notes pt's blood pressure runs on the low side (she takes each day with ranges of 100-112/50's). Blood pressure in sit 110/39 currently. Attempted stand again without relief from dizziness. Ambulation limited bed to chair accordingly. Pt requires Min A for sequence and directional cues. Pt up in chair comfortably without dizziness. Encouraged fluids and continued work on ankle pumps and isometric strengthening. Plan to see pt again this afternoon for attempted ambulation as pt blood pressure and symptoms allow. Current discharge recommendation is for skilled nursing  facility due to an ample decrease in functional mobility compared to baseline (previously independent in all mobility).  Follow Up Recommendations  SNF     Equipment Recommendations  Rolling walker with 5" wheels    Recommendations for Other Services       Precautions / Restrictions Precautions Precautions: Fall;Anterior Hip Restrictions Weight Bearing Restrictions: Yes LLE Weight Bearing: Weight bearing as tolerated    Mobility  Bed Mobility Overal bed mobility: Needs Assistance Bed Mobility: Supine to Sit     Supine to sit: Min assist (for LLE over edge of bed and sligthly more assist for trunk)     General bed mobility comments: Increased cueing/time as well as physical assist for LLE and slightly more for trunk elevation  Transfers Overall transfer level: Needs assistance Equipment used: Rolling walker (2 wheeled) Transfers: Sit to/from Stand Sit to Stand: Min assist         General transfer comment: Cues for safe hand placement versus grabbing rw. slow to rise with Min A. Increased dizziness upon stand that does not subside with 2 attempts. (BP 110/39)  Ambulation/Gait Ambulation/Gait assistance: Min assist;Min guard Ambulation Distance (Feet): 3 Feet (bed to chair) Assistive device: Rolling walker (2 wheeled) Gait Pattern/deviations: Step-to pattern;Decreased step length - left;Decreased step length - right Gait velocity: reduced Gait velocity interpretation: <1.8 ft/sec, indicative of risk for recurrent falls General Gait Details: Some difficulty initiating steps. Moderate cueing for sequence/direction   Stairs            Wheelchair Mobility    Modified Rankin (Stroke Patients Only)  Balance Overall balance assessment: Needs assistance Sitting-balance support: Feet supported;Bilateral upper extremity supported Sitting balance-Leahy Scale: Good     Standing balance support: Bilateral upper extremity supported Standing balance-Leahy  Scale: Fair                      Cognition Arousal/Alertness: Awake/alert Behavior During Therapy: WFL for tasks assessed/performed Overall Cognitive Status: History of cognitive impairments - at baseline                      Exercises Total Joint Exercises Ankle Circles/Pumps: AROM;Both;20 reps;Supine Quad Sets: Strengthening;Both;20 reps;Supine Gluteal Sets: Strengthening;Both;20 reps;Supine Short Arc Quad: AROM;Both;20 reps;Supine Heel Slides: AAROM;AROM;Left;10 reps;Other reps (comment) (2 sets; RROM R 2 sets of 10) Hip ABduction/ADduction: AAROM;Left;10 reps;Supine (2 sets: AROM R 10x 2 sets) Straight Leg Raises: AAROM;Both;10 reps;Supine (2 sets each)    General Comments        Pertinent Vitals/Pain Pain Assessment: 0-10 Pain Score: 5  Pain Location: L hip and L lateral foot (cannot reproduce lateral foot pain) Pain Descriptors / Indicators: Constant;Aching;Dull Pain Intervention(s): Monitored during session;Premedicated before session;Repositioned    Home Living                      Prior Function            PT Goals (current goals can now be found in the care plan section)      Frequency    BID      PT Plan Discharge plan needs to be updated    Co-evaluation             End of Session   Activity Tolerance: Other (comment) (limited by BP issues and dizziness) Patient left: in chair;with call bell/phone within reach;with chair alarm set;with family/visitor present     Time: [id number] PT Time Calculation (min) (ACUTE ONLY): 53 min  Charges:  $Gait Training: 8-22 mins $Therapeutic Exercise: 23-37 mins $Therapeutic Activity: 8-22 mins                    G CodesLarae Grooms, PTA 01/08/2016, 12:49 PM

## 2016-01-08 NOTE — Progress Notes (Signed)
Patient ID: Ian [name], male   DOB: [birth year], 82 y.o.   MRN: [id number]  [facility] PROGRESS NOTE  Ian [name] [id number] DOB: January 29, 1929 DOA: 01/05/2016 PCP: Ian Simpler, MD  HPI/Subjective: Patient feels well. Not really having much pain in his hip. The main complaint he does have he does feel some numbness around his left ankle. But he is able to move his ankle up and down.  Objective: Vitals:   01/08/16 1339 01/08/16 1445  BP: (!) 97/48 (!) 115/47  Pulse:  70  Resp:    Temp:      Filed Weights   01/05/16 2154 01/06/16 0256  Weight: 64 kg (141 lb) 63.7 kg (140 lb 8 oz)    ROS: Review of Systems  Constitutional: Negative for chills and fever.  Eyes: Negative for blurred vision.  Respiratory: Negative for cough and shortness of breath.   Cardiovascular: Negative for chest pain.  Gastrointestinal: Negative for abdominal pain, constipation, diarrhea, nausea and vomiting.  Genitourinary: Negative for dysuria.  Musculoskeletal: Positive for joint pain.  Neurological: Negative for dizziness and headaches.   Exam: Physical Exam  Constitutional: He is oriented to person, place, and time.  HENT:  Nose: No mucosal edema.  Mouth/Throat: No oropharyngeal exudate or posterior oropharyngeal edema.  Eyes: Conjunctivae, EOM and lids are normal. Pupils are equal, round, and reactive to light.  Neck: No JVD present. Carotid bruit is not present. No edema present. No thyroid mass and no thyromegaly present.  Cardiovascular: S1 normal and S2 normal.  Exam reveals no gallop.   Murmur heard.  Systolic murmur is present with a grade of 2/6  Pulses:      Dorsalis pedis pulses are 2+ on the right side, and 2+ on the left side.  Respiratory: No respiratory distress. He has no wheezes. He has no rhonchi. He has no rales.  GI: Soft. Bowel sounds are normal. There is no tenderness.  Musculoskeletal:       Right ankle: He exhibits no swelling.       Left ankle: He  exhibits no swelling.  Lymphadenopathy:    He has no cervical adenopathy.  Neurological: He is alert and oriented to person, place, and time. No cranial nerve deficit.  Positive tremor.  Skin: Skin is warm. No rash noted. Nails show no clubbing.  Psychiatric: He has a normal mood and affect.      Data Reviewed: Basic Metabolic Panel:  Recent Labs Lab 01/05/16 2252 01/06/16 0341 01/07/16 0416  NA 138 139 139  K 3.6 4.0 4.2  CL 97* 99* 104  CO2 36* 34* 25  GLUCOSE 108* 139* 136*  BUN 21* 19 21*  CREATININE 0.69 0.82 0.79  CALCIUM 9.2 9.1 8.8*   CBC:  Recent Labs Lab 01/05/16 2252 01/06/16 0341 01/07/16 0416 01/08/16 0341  WBC 11.1* 17.9* 13.3* 11.1*  NEUTROABS 8.9*  --   --   --   HGB 13.9 13.4 12.2* 11.4*  HCT 39.4* 39.1* 34.6* 32.8*  MCV 90.5 92.2 90.8 91.1  PLT 297 268 243 247   Cardiac Enzymes:  Recent Labs Lab 01/05/16 2252 01/07/16 0416 01/07/16 1241  TROPONINI <0.03 0.04* 0.03*     Studies: Dg Hip Operative Unilat With Pelvis Left  Result Date: 01/06/2016 CLINICAL DATA:  Fixation of left femoral neck fracture. EXAM: OPERATIVE left HIP (WITH PELVIS IF PERFORMED) 1 VIEWS TECHNIQUE: Fluoroscopic spot image(s) were submitted for interpretation post-operatively. COMPARISON:  Radiographs 01/05/2016 FINDINGS: Bipolar hip prosthesis in good position without  complicating features. The tip is not covered. IMPRESSION: Bipolar hip prosthesis is in place without complicating features. The tip of the prosthesis is not covered. Electronically Signed   By: Marijo Sanes M.D.   On: 01/06/2016 22:17   Dg Hip Unilat W Or W/o Pelvis 2-3 Views Left  Result Date: 01/06/2016 CLINICAL DATA:  Postoperative pain after left anterior hip surgery. EXAM: DG HIP (WITH OR WITHOUT PELVIS) 2-3V LEFT COMPARISON:  01/05/2016 FINDINGS: Interval postoperative changes with left hip hemiarthroplasty using non cemented components. Components appear well seated. No evidence of acute fracture  or dislocation. Skin clips and soft tissue gas are consistent with recent surgery. Pelvis appears intact. Surgical clips in the pelvis consistent with mesh hernia repair. Vascular calcifications. Degenerative changes in the lower lumbar spine. IMPRESSION: Left hip hemiarthroplasty. Components appear well seated. No acute fracture or dislocation. Electronically Signed   By: Lucienne Capers M.D.   On: 01/06/2016 23:11    Scheduled Meds: . bisoprolol-hydrochlorothiazide  1 tablet Oral Daily  . carbidopa-levodopa  1 tablet Oral 5 X Daily  . carbidopa-levodopa  1 tablet Oral TID  . docusate sodium  100 mg Oral BID  . [START ON 01/09/2016] enoxaparin (LOVENOX) injection  40 mg Subcutaneous Q24H  . furosemide  80 mg Oral Daily  . pantoprazole  40 mg Oral Daily  . polyethylene glycol  17 g Oral Daily  . polyvinyl alcohol  1 drop Both Eyes Daily  . potassium chloride  10 mEq Oral Daily  . vitamin B-12  1,000 mcg Oral Daily    Assessment/Plan:  1. Relative hypotension. Discontinue hydrochlorothiazide. Continue bisoprolol 5 mg. May need to decrease Lasix dose for tomorrow depending on blood pressure and creatinine. 2. Parkinson's disease. Continue Sinemet long-acting and short acting 3. History of coronary artery disease and chronic systolic congestive heart failure with moderate aortic stenosis. No signs of congestive heart failure at this point. On oral Lasix. 4. History of TIA 5. GERD on Protonix 6. Left hip fracture status post operative repair. Try to switch to Tylenol as quickly as possible. Will go out to rehabilitation tomorrow.  Code Status:     Code Status Orders        Start     Ordered   01/06/16 0245  Full code  Continuous     01/06/16 0244    Code Status History    Date Active Date Inactive Code Status Order ID Comments User Context   10/30/2015  9:55 AM 10/30/2015  8:33 PM Full Code [id number]  Ian Grayer, MD Inpatient   06/10/2015 11:26 AM 10/30/2015  9:55 AM DNR [id number]   Ian Minion, RN Outpatient   06/20/2014  5:10 PM 06/21/2014  5:22 PM Full Code [id number]  Ian Harp, MD Inpatient   01/21/2012  6:49 PM 01/22/2012  3:33 PM Full Code [id number]  Willette Pa, RN Inpatient    Advance Directive Documentation   Flowsheet Row Most Recent Value  Type of Advance Directive  Healthcare Power of Attorney  Pre-existing out of facility DNR order (yellow form or pink MOST form)  No data  "MOST" Form in Place?  No data      Disposition Plan: Likely rehabilitation on Friday, spoke with wife at the bedside  Consultants:  Cardiology  Orthopedic surgery  Time spent: 25 minutes  [place], [place]

## 2016-01-08 NOTE — Progress Notes (Signed)
Subjective: 2 Days Post-Op Procedure(s) (LRB): TOTAL HIP ARTHROPLASTY ANTERIOR APPROACH (Left) Patient reports pain as mild.   Patient is well, and has had no acute complaints or problems. Confusion last night that has resolved. Patient states he had a strong jerk last night and felt pain in the left groin. Pain mild this am Denies any CP, SOB, ABD pain. We will continue therapy today.  Plan is to go Rehab after hospital stay.  Objective: Vital signs in last 24 hours: Temp:  [97.4 F (36.3 C)-98.2 F (36.8 C)] 97.6 F (36.4 C) (09/21 0357) Pulse Rate:  [60-69] 67 (09/21 0357) Resp:  [18] 18 (09/21 0357) BP: (131-143)/(46-96) 131/46 (09/21 0357) SpO2:  [92 %-98 %] 92 % (09/21 0357)  Intake/Output from previous day: 09/20 0701 - 09/21 0700 In: 1240 [P.O.:1240] Out: 300 [Urine:300] Intake/Output this shift: No intake/output data recorded.   Recent Labs  01/05/16 2252 01/06/16 0341 01/07/16 0416 01/08/16 0341  HGB 13.9 13.4 12.2* 11.4*    Recent Labs  01/07/16 0416 01/08/16 0341  WBC 13.3* 11.1*  RBC 3.81* 3.60*  HCT 34.6* 32.8*  PLT 243 247    Recent Labs  01/06/16 0341 01/07/16 0416  NA 139 139  K 4.0 4.2  CL 99* 104  CO2 34* 25  BUN 19 21*  CREATININE 0.82 0.79  GLUCOSE 139* 136*  CALCIUM 9.1 8.8*   No results for input(s): LABPT, INR in the last 72 hours.  EXAM General - Patient is Alert, Appropriate and Oriented Extremity - Neurovascular intact Sensation intact distally Intact pulses distally Dorsiflexion/Plantar flexion intact No cellulitis present Compartment soft  Anterior thigh soft Leg lengths equal with no abnormal rotation. Dressing - dressing C/D/I and no drainage Motor Function - intact, moving foot and toes well on exam.   Past Medical History:  Diagnosis Date  . Allergy   . Aortic valve stenosis, acquired    a. s/p Porcine AVR 2001;  b. 09/2014 [place] Echo: mod-sev bioprosthetic AS w/ minimal change in CO and only mild  increase in gradients w/ dobutamine.   Marland Kitchen CAD (coronary artery disease)    a. s/p CABG with LIMA-LAD in 2001 with AVR. b. BMS to LCx 8/08. c. Abnl nuc/dec EF 05/2014 - s/p cath with patent LIMA to LAD, patent dominant right, mild LCx disease, patent stent. Med rx.  . Chronic combined systolic (congestive) and diastolic (congestive) heart failure (HCC)    a. EF 50-55% in 02/2013;  b. 05/2014 Echo: EF 20-25%, Gr1 DD.  Marland Kitchen Diverticulitis   . ED (erectile dysfunction)   . Essential hypertension   . GERD (gastroesophageal reflux disease)   . Hyperlipidemia   . MALT lymphoma ([place])   . Nonischemic cardiomyopathy ([place])    a. 05/2014 Echo: EF 20-25%-->f/u cath w/ nonobs dzs.  . Osteoarthritis   . Osteoporosis   . Parkinson's disease   . Renal artery stenosis (HCC)    a. 90% RRA stenosis previously. b. Duplex 09/2014: R renal artery 60-99%, L renal artery 1-59%.  . S/P cardiac pacemaker procedure, Medtronic Adapta L  ADDRL1, 01/21/12    a. 01/2012 s/p MDT [place] PPM (ser # [id number] H).  . Symptomatic bradycardia    a. 01/2012 s/p MDT [place] PPM (ser # [id number] H).  Marland Kitchen TIA (transient ischemic attack) 06/08   a. 09/2006    Assessment/Plan:   2 Days Post-Op Procedure(s) (LRB): TOTAL HIP ARTHROPLASTY ANTERIOR APPROACH (Left) Principal Problem:   Femur fracture, left (HCC) Active Problems:  Hip fracture (HCC)   Closed fracture of neck of left femur ([place])   Fall   Left leg pain   Coronary artery disease involving coronary bypass graft of native heart without angina pectoris   Preop cardiovascular exam   Cardiomyopathy, ischemic  Estimated body mass index is 22.68 kg/m as calculated from the following:   Height as of this encounter: 5\' 6"  (1.676 m).   Weight as of this encounter: 63.7 kg (140 lb 8 oz). Advance diet Up with therapy  Needs BM Acute post op blood loss anemia-Hgb stable 11.4 Patient requesting Rehab facility. Plan on discharge to rehab tomorrow   DVT  Prophylaxis - Lovenox, Foot Pumps and TED hose Weight-Bearing as tolerated to left leg   T. Rachelle Hora, PA-C [place] 01/08/2016, 8:30 AM

## 2016-01-08 NOTE — Progress Notes (Signed)
Physical Therapy Treatment Patient Details Name: Ian [name] MRN: [id number] DOB: 08-06-1928 Today's Date: 01/08/2016    History of Present Illness Pt is an 82 y.o. male with a known history of Aortic valve stenosis with status post bioprosthetic valve, coronary artery disease, CABG, systolic and diastolic heart failure, hypertension, GERD, hyperlipidemia , nonischemic cardiomyopathy presented to the emergency room with fall. Patient lost balance and fell down on the driveway of his home . He was trying to roll the garbage cans down the driveway slope, he slipped and fell down and landed on left hip.  Pt is s/p anterior L THA.    PT Comments    Pt continues well up in chair; pain minimal in right hip and decreasing in right foot. Pt agreeable to PT. Blood pressure in sit 109/43; stand attempted with immediate dizziness that does not subside. Blood pressure in stand 97/48. Deferred ambulation. Pt participated in seated exercises well and wishes to remain in chair. Continue PT to progress strength and functional mobility/ambulation as symptoms allow.   Follow Up Recommendations  SNF     Equipment Recommendations  Rolling walker with 5" wheels    Recommendations for Other Services       Precautions / Restrictions Precautions Precautions: Fall;Anterior Hip Restrictions Weight Bearing Restrictions: Yes LLE Weight Bearing: Weight bearing as tolerated    Mobility  Bed Mobility Overal bed mobility: Needs Assistance Bed Mobility: Supine to Sit     Supine to sit: Min assist (for LLE over edge of bed and sligthly more assist for trunk)     General bed mobility comments: Not tested; up in chair and wishes to remain so  Transfers Overall transfer level: Needs assistance Equipment used: Rolling walker (2 wheeled) Transfers: Sit to/from Stand Sit to Stand: Min assist         General transfer comment: Cues for hand placement; slow to rise. Min A to  steady  Ambulation/Gait Ambulation/Gait assistance: Min assist;Min guard Ambulation Distance (Feet): 3 Feet (bed to chair) Assistive device: Rolling walker (2 wheeled) Gait Pattern/deviations: Step-to pattern;Decreased step length - left;Decreased step length - right Gait velocity: reduced Gait velocity interpretation: <1.8 ft/sec, indicative of risk for recurrent falls General Gait Details: Unable due to low BP with symptoms of dizziness in stand that does no subside    Science writer    Modified Rankin (Stroke Patients Only)       Balance Overall balance assessment: Needs assistance Sitting-balance support: Feet supported;Bilateral upper extremity supported Sitting balance-Leahy Scale: Good     Standing balance support: Bilateral upper extremity supported Standing balance-Leahy Scale: Fair                      Cognition Arousal/Alertness: Awake/alert Behavior During Therapy: WFL for tasks assessed/performed Overall Cognitive Status: History of cognitive impairments - at baseline                      Exercises Total Joint Exercises Ankle Circles/Pumps: AROM;Both;20 reps;Supine Quad Sets: Strengthening;Both;20 reps (long sit) Gluteal Sets: Strengthening;Both;20 reps (long sit) Towel Squeeze: Strengthening;Both;20 reps (long sit) Short Arc Quad: AROM;Both;20 reps;Supine Heel Slides:  (2 sets; RROM R 2 sets of 10) Hip ABduction/ADduction: AAROM;Left;20 reps (long sit; AROM R) Straight Leg Raises: AAROM;10 reps;Supine;Left (2 sets each; [place] R) Long Arc Quad: AROM;Strengthening;Both;20 reps;Seated    General Comments        Pertinent Vitals/Pain Pain  Assessment:  (R hip pain mild; foot pain "easing") Pain Score: 5  Pain Location: L hip and L lateral foot (cannot reproduce lateral foot pain) Pain Descriptors / Indicators: Constant;Aching;Dull Pain Intervention(s): Monitored during session;Premedicated before  session;Repositioned    Home Living                      Prior Function            PT Goals (current goals can now be found in the care plan section) Progress towards PT goals: Progressing toward goals (slowly)    Frequency    BID      PT Plan Discharge plan needs to be updated    Co-evaluation             End of Session   Activity Tolerance: Other (comment) (limited by BP issues and dizziness) Patient left: in chair;with call bell/phone within reach;with chair alarm set;with family/visitor present     Time: [id number] PT Time Calculation (min) (ACUTE ONLY): 24 min  Charges:  $Gait Training: 8-22 mins $Therapeutic Exercise: 8-22 mins $Therapeutic Activity: 8-22 mins                    G CodesLarae Grooms, PTA 01/08/2016, 1:57 PM

## 2016-01-09 ENCOUNTER — Encounter: Payer: Self-pay | Admitting: Orthopedic Surgery

## 2016-01-09 LAB — BASIC METABOLIC PANEL
Anion gap: 4 — ABNORMAL LOW (ref 5–15)
BUN: 15 mg/dL (ref 6–20)
CO2: 34 mmol/L — ABNORMAL HIGH (ref 22–32)
Calcium: 8.5 mg/dL — ABNORMAL LOW (ref 8.9–10.3)
Chloride: 97 mmol/L — ABNORMAL LOW (ref 101–111)
Creatinine, Ser: 0.67 mg/dL (ref 0.61–1.24)
GFR calc Af Amer: >60 mL/min (ref >60)
GFR calc non Af Amer: >60 mL/min (ref >60)
Glucose, Bld: 109 mg/dL — ABNORMAL HIGH (ref 65–99)
Potassium: 4 mmol/L (ref 3.5–5.1)
Sodium: 135 mmol/L (ref 135–145)

## 2016-01-09 LAB — CBC
HCT: 32.2 % — ABNORMAL LOW (ref 40.0–52.0)
Hemoglobin: 11.4 g/dL — ABNORMAL LOW (ref 13.0–18.0)
MCH: 32.4 pg (ref 26.0–34.0)
MCHC: 35.5 g/dL (ref 32.0–36.0)
MCV: 91.3 fL (ref 80.0–100.0)
PLATELETS: 248 10*3/uL (ref 150–440)
RBC: 3.53 MIL/uL — AB (ref 4.40–5.90)
RDW: 14 % (ref 11.5–14.5)
WBC: 9.4 10*3/uL (ref 3.8–10.6)

## 2016-01-09 MED ORDER — ENSURE ENLIVE PO LIQD
237.0000 mL | Freq: Two times a day (BID) | ORAL | Status: DC
  Administered 2016-01-09 – 2016-01-10 (×2): 237 mL via ORAL

## 2016-01-09 MED ORDER — HYDROCODONE-ACETAMINOPHEN 5-325 MG PO TABS: 1.0000 | ORAL_TABLET | Freq: Three times a day (TID) | ORAL | 0 refills | Status: DC | PRN

## 2016-01-09 MED ORDER — BISOPROLOL FUMARATE 5 MG PO TABS: 5.0000 mg | ORAL_TABLET | Freq: Every day | ORAL | 0 refills | Status: DC

## 2016-01-09 MED ORDER — HYDROCODONE-ACETAMINOPHEN 5-325 MG PO TABS
1.0000 | ORAL_TABLET | ORAL | Status: DC | PRN
  Administered 2016-01-09: 1 via ORAL
  Filled 2016-01-09: qty 1

## 2016-01-09 MED ORDER — QUETIAPINE FUMARATE 25 MG PO TABS: 25.0000 mg | ORAL_TABLET | Freq: Every evening | ORAL | 0 refills | Status: DC | PRN

## 2016-01-09 MED ORDER — ASPIRIN EC 81 MG PO TBEC
81.0000 mg | DELAYED_RELEASE_TABLET | Freq: Every day | ORAL | Status: DC
  Administered 2016-01-09 – 2016-01-10 (×2): 81 mg via ORAL
  Filled 2016-01-09 (×2): qty 1

## 2016-01-09 MED ORDER — QUETIAPINE FUMARATE 25 MG PO TABS
25.0000 mg | ORAL_TABLET | Freq: Every day | ORAL | Status: DC
  Administered 2016-01-09: 25 mg via ORAL
  Filled 2016-01-09: qty 1

## 2016-01-09 MED ORDER — FUROSEMIDE 40 MG PO TABS: 40.0000 mg | ORAL_TABLET | Freq: Every day | ORAL | 0 refills | Status: DC

## 2016-01-09 MED ORDER — ENSURE ENLIVE PO LIQD: 237.0000 mL | Freq: Two times a day (BID) | ORAL | 0 refills | Status: DC

## 2016-01-09 MED ORDER — FUROSEMIDE 40 MG PO TABS
40.0000 mg | ORAL_TABLET | Freq: Every day | ORAL | Status: DC
  Administered 2016-01-09 – 2016-01-10 (×2): 40 mg via ORAL
  Filled 2016-01-09 (×2): qty 1

## 2016-01-09 NOTE — Progress Notes (Signed)
Physical Therapy Treatment Patient Details Name: Ian [name] MRN: [id number] DOB: [birth year] Today's Date: 01/09/2016    History of Present Illness Pt is an 82 y.o. male with a known history of Aortic valve stenosis with status post bioprosthetic valve, coronary artery disease, CABG, systolic and diastolic heart failure, hypertension, GERD, hyperlipidemia , nonischemic cardiomyopathy presented to the emergency room with fall. Patient lost balance and fell down on the driveway of his home . He was trying to roll the garbage cans down the driveway slope, he slipped and fell down and landed on left hip.  Pt is s/p anterior L THA.    PT Comments    Pt agreeable to PT; reports minimal left hip pain that does not increase with ambulation. Pt continues to require Min A for bed mobility and transfers with increased time. No dizziness today with stand. Blood pressure improved per pt/family and chart review. Pt progressing ambulation distance. Received up in chair comfortably. Plan to see pt this afternoon for continued progression of strength, endurance and all functional mobility.   Follow Up Recommendations  SNF     Equipment Recommendations  Rolling walker with 5" wheels    Recommendations for Other Services       Precautions / Restrictions Precautions Precautions: Fall;Anterior Hip Restrictions Weight Bearing Restrictions: Yes LLE Weight Bearing: Weight bearing as tolerated    Mobility  Bed Mobility Overal bed mobility: Needs Assistance Bed Mobility: Supine to Sit Rolling: Min assist   Supine to sit: Min assist     General bed mobility comments: Cues for sequence; Min a for trunk elevation, less so for LLE  Transfers Overall transfer level: Needs assistance Equipment used: Rolling walker (2 wheeled) Transfers: Sit to/from Stand Sit to Stand: Min assist         General transfer comment: Cues for hand placement; no dizziness upon stand. STS several times in attempt to  use urinal; ultimately sits for use.   Ambulation/Gait Ambulation/Gait assistance: Min guard Ambulation Distance (Feet): 70 Feet Assistive device: Rolling walker (2 wheeled) Gait Pattern/deviations: Step-through pattern;Decreased stride length;Narrow base of support Gait velocity: reduced Gait velocity interpretation: <1.8 ft/sec, indicative of risk for recurrent falls General Gait Details: Subjectively below baseline speed and greater reliance on rw. No LOB   Stairs            Wheelchair Mobility    Modified Rankin (Stroke Patients Only)       Balance Overall balance assessment: Needs assistance Sitting-balance support: Feet supported Sitting balance-Leahy Scale: Good     Standing balance support: Bilateral upper extremity supported Standing balance-Leahy Scale: Fair                      Cognition Arousal/Alertness: Awake/alert Behavior During Therapy: WFL for tasks assessed/performed Overall Cognitive Status: History of cognitive impairments - at baseline                      Exercises      General Comments        Pertinent Vitals/Pain Pain Assessment:  (L hip, minimal) Pain Location: L hip Pain Intervention(s): Monitored during session;Repositioned    Home Living                      Prior Function            PT Goals (current goals can now be found in the care plan section) Progress towards PT goals: Progressing toward goals  Frequency    BID      PT Plan Discharge plan needs to be updated    Co-evaluation             End of Session Equipment Utilized During Treatment: Gait belt;Oxygen Activity Tolerance: Patient tolerated treatment well Patient left: in chair;with call bell/phone within reach;with chair alarm set;with family/visitor present     Time: [id number] PT Time Calculation (min) (ACUTE ONLY): 35 min  Charges:  $Gait Training: 8-22 mins $Therapeutic Activity: 8-22 mins                     G Codes:      Larae Grooms 01/11/2016, 1:12 PM

## 2016-01-09 NOTE — Progress Notes (Signed)
Patient ID: Ian [name], male   DOB: [birth year], 82 y.o.   MRN: [id number]   [facility] PROGRESS NOTE  Ian [name] [id number] DOB: Jun 06, 1928 DOA: 01/05/2016 PCP: Viviana Simpler, MD  HPI/Subjective: Patient had a glazed look in his eyes this am when I saw him this am. He answered questions but I think he did not sleep well last night. Some confusion. As per the wife he's been like this since last evening. Complains of some discomfort in his left heel  Objective: Vitals:   01/09/16 0609 01/09/16 1148  BP: (!) 131/52   Pulse: 71 74  Resp: 16   Temp: 98.4 F (36.9 C)     Filed Weights   01/05/16 2154 01/06/16 0256  Weight: 64 kg (141 lb) 63.7 kg (140 lb 8 oz)    ROS: Review of Systems  Constitutional: Negative for chills and fever.  Eyes: Negative for blurred vision.  Respiratory: Negative for cough and shortness of breath.   Cardiovascular: Negative for chest pain.  Gastrointestinal: Negative for abdominal pain, constipation, diarrhea, nausea and vomiting.  Genitourinary: Negative for dysuria.  Musculoskeletal: Negative for joint pain.  Neurological: Negative for dizziness and headaches.   Exam: Physical Exam  Constitutional: He is oriented to person, place, and time.  HENT:  Nose: No mucosal edema.  Mouth/Throat: No oropharyngeal exudate or posterior oropharyngeal edema.  Eyes: Conjunctivae, EOM and lids are normal. Pupils are equal, round, and reactive to light.  Neck: No JVD present. Carotid bruit is not present. No edema present. No thyroid mass and no thyromegaly present.  Cardiovascular: S1 normal and S2 normal.  Exam reveals no gallop.   Murmur heard.  Systolic murmur is present with a grade of 2/6  Pulses:      Dorsalis pedis pulses are 2+ on the right side, and 2+ on the left side.  Respiratory: No respiratory distress. He has no wheezes. He has no rhonchi. He has no rales.  GI: Soft. Bowel sounds are normal. There is no tenderness.   Musculoskeletal:       Right ankle: He exhibits no swelling.       Left ankle: He exhibits no swelling.  Lymphadenopathy:    He has no cervical adenopathy.  Neurological: He is alert and oriented to person, place, and time. No cranial nerve deficit.  Positive tremor.  Skin: Skin is warm. No rash noted. Nails show no clubbing.  Psychiatric: He has a normal mood and affect.      Data Reviewed: Basic Metabolic Panel:  Recent Labs Lab 01/05/16 2252 01/06/16 0341 01/07/16 0416 01/09/16 0330  NA 138 139 139 135  K 3.6 4.0 4.2 4.0  CL 97* 99* 104 97*  CO2 36* 34* 25 34*  GLUCOSE 108* 139* 136* 109*  BUN 21* 19 21* 15  CREATININE 0.69 0.82 0.79 0.67  CALCIUM 9.2 9.1 8.8* 8.5*   CBC:  Recent Labs Lab 01/05/16 2252 01/06/16 0341 01/07/16 0416 01/08/16 0341 01/09/16 0330  WBC 11.1* 17.9* 13.3* 11.1* 9.4  NEUTROABS 8.9*  --   --   --   --   HGB 13.9 13.4 12.2* 11.4* 11.4*  HCT 39.4* 39.1* 34.6* 32.8* 32.2*  MCV 90.5 92.2 90.8 91.1 91.3  PLT 297 268 243 247 248   Cardiac Enzymes:  Recent Labs Lab 01/05/16 2252 01/07/16 0416 01/07/16 1241  TROPONINI <0.03 0.04* 0.03*     Scheduled Meds: . aspirin EC  81 mg Oral Daily  . bisoprolol  5 mg  Oral Daily  . carbidopa-levodopa  1 tablet Oral 5 X Daily  . carbidopa-levodopa  1 tablet Oral TID  . docusate sodium  100 mg Oral BID  . enoxaparin (LOVENOX) injection  40 mg Subcutaneous Q24H  . feeding supplement (ENSURE ENLIVE)  237 mL Oral BID BM  . furosemide  40 mg Oral Daily  . pantoprazole  40 mg Oral Daily  . polyethylene glycol  17 g Oral Daily  . polyvinyl alcohol  1 drop Both Eyes Daily  . potassium chloride  10 mEq Oral Daily  . QUEtiapine  25 mg Oral QHS  . vitamin B-12  1,000 mcg Oral Daily    Assessment/Plan:  1. Acute delirium. Likely the patient did not sleep last night. Of Seroquel tonight. No Haldol in patients with Parkinson's disease.  2. Relative hypotension. Discontinue hydrochlorothiazide.  Continue bisoprolol 5 mg. decrease Lasix dose to 40 mg daily 3. Parkinson's disease. Continue Sinemet long-acting and short acting 4. History of coronary artery disease and chronic systolic congestive heart failure with moderate aortic stenosis. No signs of congestive heart failure at this point. On oral Lasix. 5. History of TIA 6. GERD on Protonix 7. Left hip fracture status post operative repair. Try to switch to Tylenol as quickly as possible.   Code Status:     Code Status Orders        Start     Ordered   01/06/16 0245  Full code  Continuous     01/06/16 0244    Code Status History    Date Active Date Inactive Code Status Order ID Comments User Context   10/30/2015  9:55 AM 10/30/2015  8:33 PM Full Code [id number]  Thompson Grayer, MD Inpatient   06/10/2015 11:26 AM 10/30/2015  9:55 AM DNR [id number]  Newt Minion, RN Outpatient   06/20/2014  5:10 PM 06/21/2014  5:22 PM Full Code [id number]  Lorretta Harp, MD Inpatient   01/21/2012  6:49 PM 01/22/2012  3:33 PM Full Code [id number]  Willette Pa, RN Inpatient    Advance Directive Documentation   Flowsheet Row Most Recent Value  Type of Advance Directive  Healthcare Power of Attorney  Pre-existing out of facility DNR order (yellow form or pink MOST form)  No data  "MOST" Form in Place?  No data      Disposition Plan: Hopefully out to rehabilitation tomorrow  Consultants:  Cardiology  Orthopedic surgery  Time spent: 35 minutes  [place], [place]

## 2016-01-09 NOTE — Discharge Instructions (Addendum)
DIET:  Cardiac diet  DISCHARGE CONDITION:  Stable  ACTIVITY:  Activity as tolerated  OXYGEN:  Home Oxygen: Yes.     Oxygen Delivery: 2 liters/min via Patient connected to nasal cannula oxygen  DISCHARGE LOCATION:  nursing home    ADDITIONAL DISCHARGE INSTRUCTION:insentive spirometer q1 while awkae   If you experience worsening of your admission symptoms, develop shortness of breath, life threatening emergency, suicidal or homicidal thoughts you must seek medical attention immediately by calling 911 or calling your MD immediately  if symptoms less severe.  You Must read complete instructions/literature along with all the possible adverse reactions/side effects for all the Medicines you take and that have been prescribed to you. Take any new Medicines after you have completely understood and accpet all the possible adverse reactions/side effects.   Please note  You were cared for by a hospitalist during your hospital stay. If you have any questions about your discharge medications or the care you received while you were in the hospital after you are discharged, you can call the unit and asked to speak with the hospitalist on call if the hospitalist that took care of you is not available. Once you are discharged, your primary care physician will handle any further medical issues. Please note that NO REFILLS for any discharge medications will be authorized once you are discharged, as it is imperative that you return to your primary care physician (or establish a relationship with a primary care physician if you do not have one) for your aftercare needs so that they can reassess your need for medications and monitor your lab values.        Hip Fracture A hip fracture is a fracture of the upper part of your thigh bone (femur).  CAUSES A hip fracture is caused by a direct blow to the side of your hip. This is usually the result of a fall but can occur in other circumstances, such as  an automobile accident. RISK FACTORS There is an increased risk of hip fractures in people with:  An unsteady walking pattern (gait) and those with conditions that contribute to poor balance, such as Parkinson's disease or dementia.  Osteopenia and osteoporosis.  Cancer that spreads to the leg bones.  Certain metabolic diseases. SYMPTOMS  Symptoms of hip fracture include:  Pain over the injured hip.  Inability to put weight on the leg in which the fracture occurred (although, some patients are able to walk after a hip fracture).  Toes and foot of the affected leg point outward when you lie down. DIAGNOSIS A physical exam can determine if a hip fracture is likely to have occurred. X-ray exams are needed to confirm the fracture and to look for other injuries. The X-ray exam can help to determine the type of hip fracture. Rarely, the fracture is not visible on an X-ray image and a CT scan or MRI will have to be done. TREATMENT  The treatment for a fracture is usually surgery. This means using a screw, nail, or rod to hold the bones in place.  HOME CARE INSTRUCTIONS Take all medicines as directed by your health care provider. SEEK MEDICAL CARE IF: Pain continues, even after taking pain medicine. MAKE SURE YOU:  Understand these instructions.   Will watch your condition.  Will get help right away if you are not doing well or get worse.   This information is not intended to replace advice given to you by your health care provider. Make sure you discuss any  questions you have with your health care provider.   Document Released: 04/05/2005 Document Revised: 04/10/2013 Document Reviewed: 11/15/2012 Elsevier Interactive Patient Education 2016 [place] REPLACEMENT POSTOPERATIVE DIRECTIONS   Hip Rehabilitation, Guidelines Following Surgery  The results of a hip operation are greatly improved after range of motion and muscle strengthening exercises.  Follow all safety measures which are given to protect your hip. If any of these exercises cause increased pain or swelling in your joint, decrease the amount until you are comfortable again. Then slowly increase the exercises. Call your caregiver if you have problems or questions.   HOME CARE INSTRUCTIONS  Remove items at home which could result in a fall. This includes throw rugs or furniture in walking pathways.   ICE to the affected hip every three hours for 30 minutes at a time and then as needed for pain and swelling.  Continue to use ice on the hip for pain and swelling from surgery. You may notice swelling that will progress down to the foot and ankle.  This is normal after surgery.  Elevate the leg when you are not up walking on it.    Continue to use the breathing machine which will help keep your temperature down.  It is common for your temperature to cycle up and down following surgery, especially at night when you are not up moving around and exerting yourself.  The breathing machine keeps your lungs expanded and your temperature down.  Do not place pillow under knee, focus on keeping the knee straight while resting  DIET You may resume your previous home diet once your are discharged from the hospital.  DRESSING / WOUND CARE / SHOWERING Keep dressing clean and dry. Change dressing only as needed. You may shower after your first follow up appointment with [facility]. Follow up with [facility] orthopedics in 2 weeks for staple removal and steri strip application  ACTIVITY Walk with your walker as instructed. Use walker as long as suggested by your caregivers. Avoid periods of inactivity such as sitting longer than an hour when not asleep. This helps prevent blood clots.  You may resume a sexual relationship in one month or when given the OK by your doctor.  You may return to work once you are cleared by your doctor.  Do not drive a car for 6 weeks or until released by you surgeon.   Do not drive while taking narcotics.  WEIGHT BEARING Weight bearing as tolerated. Use walker/cane as needed for at least 4 weeks post op.  POSTOPERATIVE CONSTIPATION PROTOCOL Constipation - defined medically as fewer than three stools per week and severe constipation as less than one stool per week.  One of the most common issues patients have following surgery is constipation.  Even if you have a regular bowel pattern at home, your normal regimen is likely to be disrupted due to multiple reasons following surgery.  Combination of anesthesia, postoperative narcotics, change in appetite and fluid intake all can affect your bowels.  In order to avoid complications following surgery, here are some recommendations in order to help you during your recovery period.  Colace (docusate) - Pick up an over-the-counter form of Colace or another stool softener and take twice a day as long as you are requiring postoperative pain medications.  Take with a full glass of water daily.  If you experience loose stools or diarrhea, hold the colace until you stool forms back up.  If your symptoms do not  get better within 1 week or if they get worse, check with your doctor.  Dulcolax (bisacodyl) - Pick up over-the-counter and take as directed by the product packaging as needed to assist with the movement of your bowels.  Take with a full glass of water.  Use this product as needed if not relieved by Colace only.   MiraLax (polyethylene glycol) - Pick up over-the-counter to have on hand.  MiraLax is a solution that will increase the amount of water in your bowels to assist with bowel movements.  Take as directed and can mix with a glass of water, juice, soda, coffee, or tea.  Take if you go more than two days without a movement. Do not use MiraLax more than once per day. Call your doctor if you are still constipated or irregular after using this medication for 7 days in a row.  If you continue to have problems with  postoperative constipation, please contact the office for further assistance and recommendations.  If you experience "the worst abdominal pain ever" or develop nausea or vomiting, please contact the office immediatly for further recommendations for treatment.  ITCHING  If you experience itching with your medications, try taking only a single pain pill, or even half a pain pill at a time.  You can also use Benadryl over the counter for itching or also to help with sleep.   TED HOSE STOCKINGS Wear the elastic stockings on both legs for six weeks following surgery during the day but you may remove then at night for sleeping.  MEDICATIONS See your medication summary on the After Visit Summary that the nursing staff will review with you prior to discharge.  You may have some home medications which will be placed on hold until you complete the course of blood thinner medication.  It is important for you to complete the blood thinner medication as prescribed by your surgeon.  Continue your approved medications as instructed at time of discharge.  PRECAUTIONS If you experience chest pain or shortness of breath - call 911 immediately for transfer to the hospital emergency department.  If you develop a fever greater that 101 F, purulent drainage from wound, increased redness or drainage from wound, foul odor from the wound/dressing, or calf pain - CONTACT YOUR SURGEON.                                                   FOLLOW-UP APPOINTMENTS Make sure you keep all of your appointments after your operation with your surgeon and caregivers. You should call the office at the above phone number and make an appointment for approximately two weeks after the date of your surgery or on the date instructed by your surgeon outlined in the "After Visit Summary".  RANGE OF MOTION AND STRENGTHENING EXERCISES  These exercises are designed to help you keep full movement of your hip joint. Follow your caregiver's or  physical therapist's instructions. Perform all exercises about fifteen times, three times per day or as directed. Exercise both hips, even if you have had only one joint replacement. These exercises can be done on a training (exercise) mat, on the floor, on a table or on a bed. Use whatever works the best and is most comfortable for you. Use music or television while you are exercising so that the exercises are a pleasant break  in your day. This will make your life better with the exercises acting as a break in routine you can look forward to.  Lying on your back, slowly slide your foot toward your buttocks, raising your knee up off the floor. Then slowly slide your foot back down until your leg is straight again.  Lying on your back spread your legs as far apart as you can without causing discomfort.  Lying on your side, raise your upper leg and foot straight up from the floor as far as is comfortable. Slowly lower the leg and repeat.  Lying on your back, tighten up the muscle in the front of your thigh (quadriceps muscles). You can do this by keeping your leg straight and trying to raise your heel off the floor. This helps strengthen the largest muscle supporting your knee.  Lying on your back, tighten up the muscles of your buttocks both with the legs straight and with the knee bent at a comfortable angle while keeping your heel on the floor.   IF YOU ARE TRANSFERRED TO A SKILLED REHAB FACILITY If the patient is transferred to a skilled rehab facility following release from the hospital, a list of the current medications will be sent to the facility for the patient to continue.  When discharged from the skilled rehab facility, please have the facility set up the patient's [place] prior to being released. Also, the skilled facility will be responsible for providing the patient with their medications at time of release from the facility to include their pain medication, the muscle  relaxants, and their blood thinner medication. If the patient is still at the rehab facility at time of the two week follow up appointment, the skilled rehab facility will also need to assist the patient in arranging follow up appointment in our office and any transportation needs.  MAKE SURE YOU:  Understand these instructions.  Get help right away if you are not doing well or get worse.    Pick up stool softner and laxative for home use following surgery while on pain medications. Continue to use ice for pain and swelling after surgery. Do not use any lotions or creams on the incision until instructed by your surgeon.

## 2016-01-09 NOTE — Care Management (Signed)
Anticipate patient will discharge to skilled nursing facility 9.23.  He is requiring some adjustment in medications.  He is not sleeping.  Started seroquel.

## 2016-01-09 NOTE — Progress Notes (Signed)
Physical Therapy Treatment Patient Details Name: Ian [name] MRN: [id number] DOB: [birth year] Today's Date: 01/09/2016    History of Present Illness Pt is an 82 y.o. male with a known history of Aortic valve stenosis with status post bioprosthetic valve, coronary artery disease, CABG, systolic and diastolic heart failure, hypertension, GERD, hyperlipidemia , nonischemic cardiomyopathy presented to the emergency room with fall. Patient lost balance and fell down on the driveway of his home . He was trying to roll the garbage cans down the driveway slope, he slipped and fell down and landed on left hip.  Pt is s/p anterior L THA.    PT Comments    Pt up in chair, but would like to return to bed. Pt agreeable to PT session first. Pt continues to demonstrate mild unsteadiness with transition with sit to/from stand requiring Min A. Pt ambulation on room air this afternoon with O2 saturation remaining 94-96%; nursing notified. Ambulation without dizziness; slow, but steady requiring Min guard for safety. Continue PT to progress strength, endurance and all functional mobility. Continue to recommend skilled nursing facility, as pt is below baseline and below safe ability to manage at home.   Follow Up Recommendations  SNF     Equipment Recommendations  Rolling walker with 5" wheels    Recommendations for Other Services       Precautions / Restrictions Precautions Precautions: Fall;Anterior Hip Restrictions Weight Bearing Restrictions: Yes LLE Weight Bearing: Weight bearing as tolerated    Mobility  Bed Mobility Overal bed mobility: Needs Assistance Bed Mobility: Supine to Sit     Supine to sit: Min assist     General bed mobility comments: Cues for sequence; Min a for trunk elevation, less so for LLE  Transfers Overall transfer level: Needs assistance Equipment used: Rolling walker (2 wheeled) Transfers: Sit to/from Stand Sit to Stand: Min assist         General transfer  comment: Cues for hand placement; assist to steady with transition to rw. Assist sit to raised commode  Ambulation/Gait Ambulation/Gait assistance: Min guard Ambulation Distance (Feet): 70 Feet Assistive device: Rolling walker (2 wheeled) Gait Pattern/deviations: Step-through pattern Gait velocity: reduced Gait velocity interpretation: <1.8 ft/sec, indicative of risk for recurrent falls General Gait Details: Continues with slow steady gait; O2 saturation remains94-96% on room air with ambulation   Stairs            Wheelchair Mobility    Modified Rankin (Stroke Patients Only)       Balance Overall balance assessment: Needs assistance Sitting-balance support: Feet supported Sitting balance-Leahy Scale: Good     Standing balance support: Bilateral upper extremity supported Standing balance-Leahy Scale: Fair                      Cognition Arousal/Alertness: Awake/alert Behavior During Therapy: WFL for tasks assessed/performed Overall Cognitive Status: History of cognitive impairments - at baseline                      Exercises      General Comments        Pertinent Vitals/Pain Pain Assessment: No/denies pain Pain Location: L hip Pain Intervention(s): Monitored during session;Repositioned    Home Living                      Prior Function            PT Goals (current goals can now be found in the care plan  section) Progress towards PT goals: Progressing toward goals    Frequency    BID      PT Plan Current plan remains appropriate    Co-evaluation             End of Session Equipment Utilized During Treatment: Gait belt Activity Tolerance: Patient tolerated treatment well Patient left: Other (comment);with nursing/sitter in room;with family/visitor present (On raised commode )     Time: [id number] PT Time Calculation (min) (ACUTE ONLY): 25 min  Charges:  $Gait Training: 8-22 mins $Therapeutic Activity:  8-22 mins                    G CodesLarae Grooms, PTA 01/09/2016, 2:46 PM

## 2016-01-09 NOTE — Progress Notes (Signed)
Subjective: 3 Days Post-Op Procedure(s) (LRB): TOTAL HIP ARTHROPLASTY ANTERIOR APPROACH (Left) Patient reports pain as mild.   Patient is well, but has had some minor complaints of decreased appetitie. Patient with difficulty sleeping last night..  Denies any CP, SOB, ABD pain. We will continue therapy today.  Plan is to go Rehab after hospital stay.  Objective: Vital signs in last 24 hours: Temp:  [97.5 F (36.4 C)-98.4 F (36.9 C)] 98.4 F (36.9 C) (09/22 0609) Pulse Rate:  [67-72] 71 (09/22 0609) Resp:  [16-18] 16 (09/22 0609) BP: (97-131)/(39-52) 131/52 (09/22 0609) SpO2:  [91 %-94 %] 91 % (09/22 0609)  Intake/Output from previous day: 09/21 0701 - 09/22 0700 In: 100 [P.O.:100] Out: 500 [Urine:500] Intake/Output this shift: No intake/output data recorded.   Recent Labs  01/07/16 0416 01/08/16 0341 01/09/16 0330  HGB 12.2* 11.4* 11.4*    Recent Labs  01/08/16 0341 01/09/16 0330  WBC 11.1* 9.4  RBC 3.60* 3.53*  HCT 32.8* 32.2*  PLT 247 248    Recent Labs  01/07/16 0416 01/09/16 0330  NA 139 135  K 4.2 4.0  CL 104 97*  CO2 25 34*  BUN 21* 15  CREATININE 0.79 0.67  GLUCOSE 136* 109*  CALCIUM 8.8* 8.5*   No results for input(s): LABPT, INR in the last 72 hours.  EXAM General - Patient is Alert, Appropriate and Oriented Extremity - Neurovascular intact Sensation intact distally Intact pulses distally Dorsiflexion/Plantar flexion intact No cellulitis present Compartment soft  Anterior thigh soft Leg lengths equal with no abnormal rotation. Dressing - dressing C/D/I and no drainage Motor Function - intact, moving foot and toes well on exam.   Past Medical History:  Diagnosis Date  . Allergy   . Aortic valve stenosis, acquired    a. s/p Porcine AVR 2001;  b. 09/2014 Dobut Echo: mod-sev bioprosthetic AS w/ minimal change in CO and only mild increase in gradients w/ dobutamine.   Marland Kitchen CAD (coronary artery disease)    a. s/p CABG with LIMA-LAD  in 2001 with AVR. b. BMS to LCx 8/08. c. Abnl nuc/dec EF 05/2014 - s/p cath with patent LIMA to LAD, patent dominant right, mild LCx disease, patent stent. Med rx.  . Chronic combined systolic (congestive) and diastolic (congestive) heart failure (HCC)    a. EF 50-55% in 02/2013;  b. 05/2014 Echo: EF 20-25%, Gr1 DD.  Marland Kitchen Diverticulitis   . ED (erectile dysfunction)   . Essential hypertension   . GERD (gastroesophageal reflux disease)   . Hyperlipidemia   . MALT lymphoma ([place])   . Nonischemic cardiomyopathy ([place])    a. 05/2014 Echo: EF 20-25%-->f/u cath w/ nonobs dzs.  . Osteoarthritis   . Osteoporosis   . Parkinson's disease   . Renal artery stenosis (HCC)    a. 90% RRA stenosis previously. b. Duplex 09/2014: R renal artery 60-99%, L renal artery 1-59%.  . S/P cardiac pacemaker procedure, Medtronic Adapta L  ADDRL1, 01/21/12    a. 01/2012 s/p MDT [place] PPM (ser # [id number]).  . Symptomatic bradycardia    a. 01/2012 s/p MDT [place] PPM (ser # [id number]).  Marland Kitchen TIA (transient ischemic attack) 06/08   a. 09/2006    Assessment/Plan:   3 Days Post-Op Procedure(s) (LRB): TOTAL HIP ARTHROPLASTY ANTERIOR APPROACH (Left) Principal Problem:   Femur fracture, left (HCC) Active Problems:   Hip fracture (HCC)   Closed fracture of neck of left femur (HCC)   Fall   Left leg  pain   Coronary artery disease involving coronary bypass graft of native heart without angina pectoris   Preop cardiovascular exam   Cardiomyopathy, ischemic  Estimated body mass index is 22.68 kg/m as calculated from the following:   Height as of this encounter: 5\' 6"  (1.676 m).   Weight as of this encounter: 63.7 kg (140 lb 8 oz). Advance diet Up with therapy  Patient requesting Rehab facility. Plan on discharge to rehab today or tomorrow pending medical clearance Lovenox 40 mg SQ daily for 14 days TED hose daily, remove at night for 6 weeks Follow up with [place] [facility] in 2 weeks for staple  removal   DVT Prophylaxis - Lovenox, Foot Pumps and TED hose Weight-Bearing as tolerated to left leg   T. Rachelle Hora, PA-C [place] 01/09/2016, 8:11 AM

## 2016-01-09 NOTE — Anesthesia Postprocedure Evaluation (Signed)
Anesthesia Post Note  Patient: Ian [name]  Procedure(s) Performed: Procedure(s) (LRB): TOTAL HIP ARTHROPLASTY ANTERIOR APPROACH (Left)  Patient location during evaluation: PACU Anesthesia Type: General Level of consciousness: awake and alert and oriented Pain management: pain level controlled Vital Signs Assessment: post-procedure vital signs reviewed and stable Respiratory status: spontaneous breathing Cardiovascular status: blood pressure returned to baseline Anesthetic complications: no    Last Vitals:  Vitals:   01/09/16 1148 01/09/16 1414  BP:    Pulse: 74 77  Resp:    Temp:      Last Pain:  Vitals:   01/09/16 1210  TempSrc:   PainSc: Asleep                 Sidhant Helderman

## 2016-01-09 NOTE — Clinical Social Work Note (Addendum)
MSW contacted [facility] SNF to confirm they can accept patient over the weekend.  [facility] said yes they can accept patient if he is medically ready for discharge and orders have been received.  MSW to continue to follow patient's progress throughout discharge planning.  Jones Broom. Norval Morton, MSW [phone]  Mon-Fri 8a-4:30p 01/09/2016 2:44 PM

## 2016-01-09 NOTE — Discharge Summary (Signed)
[facility] NAME: Ian [name]    MR#:  [id number]  DATE OF BIRTH:  January 05, 1929  DATE OF ADMISSION:  01/05/2016 ADMITTING PHYSICIAN: Saundra Shelling, MD  DATE OF DISCHARGE: 01/10/2016  PRIMARY CARE PHYSICIAN: Viviana Simpler, MD    ADMISSION DIAGNOSIS:  Left leg pain [M79.605] Fall, initial encounter [W19.XXXA] Closed fracture of neck of left femur, initial encounter ([place]) [S72.002A]  DISCHARGE DIAGNOSIS:  Principal Problem:   Femur fracture, left (HCC) Active Problems:   Hip fracture (HCC)   Closed fracture of neck of left femur (HCC)   Fall   Left leg pain   Coronary artery disease involving coronary bypass graft of native heart without angina pectoris   Preop cardiovascular exam   Cardiomyopathy, ischemic   SECONDARY DIAGNOSIS:   Past Medical History:  Diagnosis Date  . Allergy   . Aortic valve stenosis, acquired    a. s/p Porcine AVR 2001;  b. 09/2014 Dobut Echo: mod-sev bioprosthetic AS w/ minimal change in CO and only mild increase in gradients w/ dobutamine.   Marland Kitchen CAD (coronary artery disease)    a. s/p CABG with LIMA-LAD in 2001 with AVR. b. BMS to LCx 8/08. c. Abnl nuc/dec EF 05/2014 - s/p cath with patent LIMA to LAD, patent dominant right, mild LCx disease, patent stent. Med rx.  . Chronic combined systolic (congestive) and diastolic (congestive) heart failure (HCC)    a. EF 50-55% in 02/2013;  b. 05/2014 Echo: EF 20-25%, Gr1 DD.  Marland Kitchen Diverticulitis   . ED (erectile dysfunction)   . Essential hypertension   . GERD (gastroesophageal reflux disease)   . Hyperlipidemia   . MALT lymphoma ([place])   . Nonischemic cardiomyopathy ([place])    a. 05/2014 Echo: EF 20-25%-->f/u cath w/ nonobs dzs.  . Osteoarthritis   . Osteoporosis   . Parkinson's disease   . Renal artery stenosis (HCC)    a. 90% RRA stenosis previously. b. Duplex 09/2014: R renal artery 60-99%, L renal artery 1-59%.  . S/P cardiac pacemaker procedure, Medtronic  Adapta L  ADDRL1, 01/21/12    a. 01/2012 s/p MDT [place] PPM (ser # [id number]).  . Symptomatic bradycardia    a. 01/2012 s/p MDT [place] PPM (ser # [id number]).  Marland Kitchen TIA (transient ischemic attack) 06/08   a. 09/2006    HOSPITAL COURSE:   1. Left hip fracture status post operative repair. Please see operative report. Follow-up with orthopedic surgery as outpatient. Follow-up with physical therapy at rehabilitation. 2. Acute encephalopathy. Likely secondary to the patient not sleeping well last night. Trial of Seroquel at night. If patient's mental status is better tomorrow may be getting out to the rehabilitation. 3. Relative hypotension. Discontinue hydrochlorothiazide. Continue bisoprolol 5 mg daily. Decrease Lasix to 40 mg daily. 4. Parkinson's disease continue Sinemet long-acting and short acting medications as he usually takes at home. 5. History of coronary artery disease and chronic systolic congestive heart failure with moderate aortic stenosis. No signs of congestive heart failure at this point. 6. History of TIA. Restart aspirin 7. GERD on Protonix  DISCHARGE CONDITIONS:   Satisfactory  CONSULTS OBTAINED:  Treatment Team:  Minna Merritts, MD Wellington Hampshire, MD  DRUG ALLERGIES:   Allergies  Allergen Reactions  . Plavix [Clopidogrel]     Fatigue   . Pravastatin     Muscle aches  . Zocor [Simvastatin]     REACTION: dizziness  . Penicillins Rash  Has patient had a PCN reaction causing immediate rash, facial/tongue/throat swelling, SOB or lightheadedness with hypotension: YES Has patient had a PCN reaction causing severe rash involving mucus membranes or skin necrosis: NO Has patient had a PCN reaction that required hospitalization NO Has patient had a PCN reaction occurring within the last 10 years: NO If all of the above answers are "NO", then may proceed with Cephalosporin use.    DISCHARGE MEDICATIONS:   Current Discharge Medication List     START taking these medications   Details  bisoprolol (ZEBETA) 5 MG tablet Take 1 tablet (5 mg total) by mouth daily. Qty: 30 tablet, Refills: 0    enoxaparin (LOVENOX) 40 MG/0.4ML injection Inject 0.4 mLs (40 mg total) into the skin daily. Qty: 14 Syringe, Refills: 0    feeding supplement, ENSURE ENLIVE, (ENSURE ENLIVE) LIQD Take 237 mLs by mouth 2 (two) times daily between meals. Qty: 60 Bottle, Refills: 0    HYDROcodone-acetaminophen (NORCO/VICODIN) 5-325 MG tablet Take 1 tablet by mouth every 8 (eight) hours as needed for moderate pain or severe pain. Qty: 10 tablet, Refills: 0    QUEtiapine (SEROQUEL) 25 MG tablet Take 1 tablet (25 mg total) by mouth at bedtime as needed. Qty: 30 tablet, Refills: 0      CONTINUE these medications which have CHANGED   Details  furosemide (LASIX) 40 MG tablet Take 1 tablet (40 mg total) by mouth daily. Qty: 30 tablet, Refills: 0      CONTINUE these medications which have NOT CHANGED   Details  aspirin 81 MG tablet Take 81 mg by mouth daily.    carbidopa-levodopa (SINEMET CR) 50-200 MG tablet Take 1 tablet by mouth 5 (five) times daily. Qty: 450 tablet, Refills: 1    carbidopa-levodopa (SINEMET IR) 25-100 MG tablet Take 1 tablet by mouth 3 (three) times daily. Qty: 270 tablet, Refills: 1    lansoprazole (PREVACID) 15 MG capsule Take 15 mg by mouth daily as needed (Approx 2 times weekly). gerd    Polyethyl Glycol-Propyl Glycol (SYSTANE ULTRA OP) Place 1 drop into both eyes daily.    polyethylene glycol (MIRALAX / GLYCOLAX) packet Take 17 g by mouth daily.    potassium chloride (K-DUR,KLOR-CON) 10 MEQ tablet Take 10 mEq by mouth daily.    Associated Diagnoses: Chronic combined systolic (congestive) and diastolic (congestive) heart failure (HCC)    vitamin B-12 (CYANOCOBALAMIN) 1000 MCG tablet Take 1,000 mcg by mouth daily.      STOP taking these medications     bisoprolol-hydrochlorothiazide (ZIAC) 5-6.25 MG tablet           DISCHARGE INSTRUCTIONS:   Follow-up Dr. rehabilitation 1 day  If you experience worsening of your admission symptoms, develop shortness of breath, life threatening emergency, suicidal or homicidal thoughts you must seek medical attention immediately by calling 911 or calling your MD immediately  if symptoms less severe.  You Must read complete instructions/literature along with all the possible adverse reactions/side effects for all the Medicines you take and that have been prescribed to you. Take any new Medicines after you have completely understood and accept all the possible adverse reactions/side effects.   Please note  You were cared for by a hospitalist during your hospital stay. If you have any questions about your discharge medications or the care you received while you were in the hospital after you are discharged, you can call the unit and asked to speak with the hospitalist on call if the hospitalist that took care of  you is not available. Once you are discharged, your primary care physician will handle any further medical issues. Please note that NO REFILLS for any discharge medications will be authorized once you are discharged, as it is imperative that you return to your primary care physician (or establish a relationship with a primary care physician if you do not have one) for your aftercare needs so that they can reassess your need for medications and monitor your lab values.    Today   CHIEF COMPLAINT:   Chief Complaint  Patient presents with  . Fall    unwitness   . Leg Pain    left hip groin and knee    HISTORY OF PRESENT ILLNESS:  Ian [name]  is a 82 y.o. male presented after a fall and found to have a hip fracture   VITAL SIGNS:  Blood pressure (!) 131/52, pulse 71, temperature 98.4 F (36.9 C), temperature source Oral, resp. rate 16, height 5\' 6"  (1.676 m), weight 63.7 kg (140 lb 8 oz), SpO2 91 %.    PHYSICAL EXAMINATION:  GENERAL:  82  y.o.-year-old patient lying in the bed with no acute distress.  EYES: Pupils equal, round, reactive to light and accommodation. No scleral icterus. Extraocular muscles intact.  HEENT: Head atraumatic, normocephalic. Oropharynx and nasopharynx clear.  NECK:  Supple, no jugular venous distention. No thyroid enlargement, no tenderness.  LUNGS: Normal breath sounds bilaterally, no wheezing, rales,rhonchi or crepitation. No use of accessory muscles of respiration.  CARDIOVASCULAR: S1, S2 normal. 3 and a 6 systolic murmurs, no rubs, or gallops.  ABDOMEN: Soft, non-tender, non-distended. Bowel sounds present. No organomegaly or mass.  EXTREMITIES: No pedal edema, cyanosis, or clubbing.  NEUROLOGIC: Cranial nerves II through XII are intact. Muscle strength 5/5 in all extremities. Sensation intact. Gait not checked.  PSYCHIATRIC: The patient is alert and Answers questions but some confusion.  SKIN: No obvious rash, lesion, or ulcer.   DATA REVIEW:   CBC  Recent Labs Lab 01/09/16 0330  WBC 9.4  HGB 11.4*  HCT 32.2*  PLT 248    Chemistries   Recent Labs Lab 01/09/16 0330  NA 135  K 4.0  CL 97*  CO2 34*  GLUCOSE 109*  BUN 15  CREATININE 0.67  CALCIUM 8.5*    Cardiac Enzymes  Recent Labs Lab 01/07/16 1241  TROPONINI 0.03*     Management plans discussed with the patient, family and they are in agreement.  CODE STATUS:     Code Status Orders        Start     Ordered   01/06/16 0245  Full code  Continuous     01/06/16 0244    Code Status History    Date Active Date Inactive Code Status Order ID Comments User Context   10/30/2015  9:55 AM 10/30/2015  8:33 PM Full Code [id number]  Thompson Grayer, MD Inpatient   06/10/2015 11:26 AM 10/30/2015  9:55 AM DNR [id number]  Newt Minion, RN Outpatient   06/20/2014  5:10 PM 06/21/2014  5:22 PM Full Code [id number]  Lorretta Harp, MD Inpatient   01/21/2012  6:49 PM 01/22/2012  3:33 PM Full Code [id number]  Willette Pa, RN  Inpatient    Advance Directive Documentation   Flowsheet Row Most Recent Value  Type of Advance Directive  Healthcare Power of Attorney  Pre-existing out of facility DNR order (yellow form or pink MOST form)  No data  "MOST" Form in Place?  No data  TOTAL TIME TAKING CARE OF THIS PATIENT: 35 minutes.    Loletha Grayer M.D on 01/09/2016 at 12:44 PM  Between 7am to 6pm - Pager - [phone]  After 6pm go to [email or web address] - password [email or web address]  [facility] Office  [phone]  CC: Primary care physician; Viviana Simpler, MD

## 2016-01-09 NOTE — Care Management Important Message (Signed)
Important Message  Patient Details  Name: Ian [name] MRN: [id number] Date of Birth: 02-19-1929   Medicare Important Message Given:  Yes    Katrina Stack, RN 01/09/2016, 2:43 PM

## 2016-01-10 DIAGNOSIS — I429 Cardiomyopathy, unspecified: Secondary | ICD-10-CM | POA: Diagnosis not present

## 2016-01-10 DIAGNOSIS — I2510 Atherosclerotic heart disease of native coronary artery without angina pectoris: Secondary | ICD-10-CM | POA: Diagnosis not present

## 2016-01-10 DIAGNOSIS — I509 Heart failure, unspecified: Secondary | ICD-10-CM | POA: Diagnosis not present

## 2016-01-10 DIAGNOSIS — M6281 Muscle weakness (generalized): Secondary | ICD-10-CM | POA: Diagnosis not present

## 2016-01-10 DIAGNOSIS — R410 Disorientation, unspecified: Secondary | ICD-10-CM | POA: Diagnosis not present

## 2016-01-10 DIAGNOSIS — Z951 Presence of aortocoronary bypass graft: Secondary | ICD-10-CM | POA: Diagnosis not present

## 2016-01-10 DIAGNOSIS — M25559 Pain in unspecified hip: Secondary | ICD-10-CM | POA: Diagnosis not present

## 2016-01-10 DIAGNOSIS — I5022 Chronic systolic (congestive) heart failure: Secondary | ICD-10-CM | POA: Diagnosis not present

## 2016-01-10 DIAGNOSIS — Z471 Aftercare following joint replacement surgery: Secondary | ICD-10-CM | POA: Diagnosis not present

## 2016-01-10 DIAGNOSIS — Z96642 Presence of left artificial hip joint: Secondary | ICD-10-CM | POA: Diagnosis not present

## 2016-01-10 DIAGNOSIS — S7292XA Unspecified fracture of left femur, initial encounter for closed fracture: Secondary | ICD-10-CM | POA: Diagnosis not present

## 2016-01-10 DIAGNOSIS — R4189 Other symptoms and signs involving cognitive functions and awareness: Secondary | ICD-10-CM | POA: Diagnosis not present

## 2016-01-10 DIAGNOSIS — Z23 Encounter for immunization: Secondary | ICD-10-CM | POA: Diagnosis not present

## 2016-01-10 DIAGNOSIS — G20 Parkinson's disease: Secondary | ICD-10-CM | POA: Diagnosis not present

## 2016-01-10 DIAGNOSIS — R2689 Other abnormalities of gait and mobility: Secondary | ICD-10-CM | POA: Diagnosis not present

## 2016-01-10 DIAGNOSIS — Z7401 Bed confinement status: Secondary | ICD-10-CM | POA: Diagnosis not present

## 2016-01-10 DIAGNOSIS — I959 Hypotension, unspecified: Secondary | ICD-10-CM | POA: Diagnosis not present

## 2016-01-10 NOTE — Progress Notes (Signed)
Physical Therapy Treatment Patient Details Name: Ian [name] MRN: [id number] DOB: [birth year] Today's Date: 01/10/2016    History of Present Illness Pt is an 82 y.o. male with a known history of Aortic valve stenosis with status post bioprosthetic valve, coronary artery disease, CABG, systolic and diastolic heart failure, hypertension, GERD, hyperlipidemia , nonischemic cardiomyopathy presented to the emergency room with fall. Patient lost balance and fell down on the driveway of his home . He was trying to roll the garbage cans down the driveway slope, he slipped and fell down and landed on left hip.  Pt is s/p anterior L THA.    PT Comments    Pt is seen as he is heading to SNF, now transported as PT is documenting.  Will anticipate dc is completed and will dc therapy as well.    Follow Up Recommendations  SNF     Equipment Recommendations  Rolling walker with 5" wheels    Recommendations for Other Services Rehab consult     Precautions / Restrictions Precautions Precautions: Fall;Anterior Hip Precaution Booklet Issued: Yes (comment) Precaution Comments: BUE and hand tremors moderate from Parkinson's Restrictions Weight Bearing Restrictions: Yes LLE Weight Bearing: Weight bearing as tolerated    Mobility  Bed Mobility Overal bed mobility: Needs Assistance Bed Mobility: Supine to Sit;Sit to Supine     Supine to sit: Min assist Sit to supine: Min assist;Mod assist (onto gurney to SNF)   General bed mobility comments: cued sequence again  Transfers Overall transfer level: Needs assistance Equipment used: Rolling walker (2 wheeled) Transfers: Sit to/from [facility] Sit to Stand: Min guard;Min assist Stand pivot transfers: Min guard;Min assist       General transfer comment: continues to need cues for hand placement but is a Scientist, research (physical sciences)  Ambulation/Gait Ambulation/Gait assistance: Min guard Ambulation Distance (Feet): 25 Feet Assistive device:  Rolling walker (2 wheeled) Gait Pattern/deviations: Step-through pattern;Decreased stride length;Trunk flexed;Wide base of support Gait velocity: reduced Gait velocity interpretation: Below normal speed for age/gender General Gait Details: used tank as pt on wall air   Stairs            Wheelchair Mobility    Modified Rankin (Stroke Patients Only)       Balance Overall balance assessment: History of Falls   Sitting balance-Leahy Scale: Good       Standing balance-Leahy Scale: Fair                      Cognition Arousal/Alertness: Awake/alert Behavior During Therapy: WFL for tasks assessed/performed Overall Cognitive Status: History of cognitive impairments - at baseline                      Exercises      General Comments        Pertinent Vitals/Pain Pain Assessment: No/denies pain    Home Living                      Prior Function            PT Goals (current goals can now be found in the care plan section) Progress towards PT goals: Progressing toward goals    Frequency    7X/week      PT Plan Current plan remains appropriate    Co-evaluation             End of Session Equipment Utilized During Treatment: Gait belt;Oxygen Activity Tolerance: Patient tolerated treatment well;Other (  comment) (limited by arrival of EMT's) Patient left: in bed;with call bell/phone within reach;with nursing/sitter in room;with family/visitor present (onto gurney)     Time: [id number] PT Time Calculation (min) (ACUTE ONLY): 35 min  Charges:  $Gait Training: 8-22 mins $Therapeutic Activity: 8-22 mins                    G Codes:      Ramond Dial 01-29-2016, 3:28 PM    Mee Hives, PT MS Acute Rehab Dept. Number: [place] [facility] [place]

## 2016-01-10 NOTE — Clinical Social Work Note (Signed)
Patient to dc to [facility] via non-emergent EMS due to recent hip surgery. Facility and family aware. CSW will con't to follow pending dc needs.  Santiago Bumpers, MSW, LCSW-A [phone]

## 2016-01-10 NOTE — Discharge Summary (Signed)
[facility] [place] NAME: Ian [name]    MR#:  [id number]  DATE OF BIRTH:  [birth year]  DATE OF ADMISSION:  01/05/2016 ADMITTING PHYSICIAN: Saundra Shelling, MD  DATE OF DISCHARGE: 01/10/2016  PRIMARY CARE PHYSICIAN: Viviana Simpler, MD    ADMISSION DIAGNOSIS:  Left leg pain [M79.605] Fall, initial encounter [W19.XXXA] Closed fracture of neck of left femur, initial encounter ([place]) [S72.002A] Acute delirium  DISCHARGE DIAGNOSIS:  Principal Problem:   Femur fracture, left (HCC) Active Problems:   Hip fracture (HCC)   Closed fracture of neck of left femur (HCC)   Fall   Left leg pain   Coronary artery disease involving coronary bypass graft of native heart without angina pectoris   Preop cardiovascular exam   Cardiomyopathy, ischemic   SECONDARY DIAGNOSIS:   Past Medical History:  Diagnosis Date  . Allergy   . Aortic valve stenosis, acquired    a. s/p Porcine AVR 2001;  b. 09/2014 Dobut Echo: mod-sev bioprosthetic AS w/ minimal change in CO and only mild increase in gradients w/ dobutamine.   Marland Kitchen CAD (coronary artery disease)    a. s/p CABG with LIMA-LAD in 2001 with AVR. b. BMS to LCx 8/08. c. Abnl nuc/dec EF 05/2014 - s/p cath with patent LIMA to LAD, patent dominant right, mild LCx disease, patent stent. Med rx.  . Chronic combined systolic (congestive) and diastolic (congestive) heart failure (HCC)    a. EF 50-55% in 02/2013;  b. 05/2014 Echo: EF 20-25%, Gr1 DD.  Marland Kitchen Diverticulitis   . ED (erectile dysfunction)   . Essential hypertension   . GERD (gastroesophageal reflux disease)   . Hyperlipidemia   . MALT lymphoma ([place])   . Nonischemic cardiomyopathy ([place])    a. 05/2014 Echo: EF 20-25%-->f/u cath w/ nonobs dzs.  . Osteoarthritis   . Osteoporosis   . Parkinson's disease   . Renal artery stenosis (HCC)    a. 90% RRA stenosis previously. b. Duplex 09/2014: R renal artery 60-99%, L renal artery 1-59%.  . S/P cardiac pacemaker  procedure, Medtronic Adapta L  ADDRL1, 01/21/12    a. 01/2012 s/p MDT [place] PPM (ser # [id number]).  . Symptomatic bradycardia    a. 01/2012 s/p MDT [place] PPM (ser # [id number]).  Marland Kitchen TIA (transient ischemic attack) 06/08   a. 09/2006    HOSPITAL COURSE:   1. Left hip fracture status post operative repair. Please see operative report. Follow-up with orthopedic surgery as outpatient. Follow-up with physical therapy at rehabilitation. 2. Acute encephalopathy.due to acute delirlack of sleep now improved 3. Relative hypotension. Discontinue hydrochlorothiazide. Continue bisoprolol 5 mg daily. Decrease Lasix to 40 mg daily. 4. Parkinson's disease continue Sinemet long-acting and short acting medications as he usually takes at home. 5. History of coronary artery disease and chronic systolic congestive heart failure with moderate aortic stenosis. No signs of congestive heart failure at this point. 6. History of TIA. Restart aspirin 7. GERD on Protonix  DISCHARGE CONDITIONS:   Satisfactory  CONSULTS OBTAINED:  Treatment Team:  Minna Merritts, MD Wellington Hampshire, MD  DRUG ALLERGIES:   Allergies  Allergen Reactions  . Plavix [Clopidogrel]     Fatigue   . Pravastatin     Muscle aches  . Zocor [Simvastatin]     REACTION: dizziness  . Penicillins Rash    Has patient had a PCN reaction causing immediate rash, facial/tongue/throat swelling, SOB or lightheadedness with hypotension: YES Has patient had  a PCN reaction causing severe rash involving mucus membranes or skin necrosis: NO Has patient had a PCN reaction that required hospitalization NO Has patient had a PCN reaction occurring within the last 10 years: NO If all of the above answers are "NO", then may proceed with Cephalosporin use.    DISCHARGE MEDICATIONS:   Current Discharge Medication List    START taking these medications   Details  bisoprolol (ZEBETA) 5 MG tablet Take 1 tablet (5 mg total) by  mouth daily. Qty: 30 tablet, Refills: 0    enoxaparin (LOVENOX) 40 MG/0.4ML injection Inject 0.4 mLs (40 mg total) into the skin daily. Qty: 14 Syringe, Refills: 0    feeding supplement, ENSURE ENLIVE, (ENSURE ENLIVE) LIQD Take 237 mLs by mouth 2 (two) times daily between meals. Qty: 60 Bottle, Refills: 0    HYDROcodone-acetaminophen (NORCO/VICODIN) 5-325 MG tablet Take 1 tablet by mouth every 8 (eight) hours as needed for moderate pain or severe pain. Qty: 10 tablet, Refills: 0    QUEtiapine (SEROQUEL) 25 MG tablet Take 1 tablet (25 mg total) by mouth at bedtime as needed. Qty: 30 tablet, Refills: 0      CONTINUE these medications which have CHANGED   Details  furosemide (LASIX) 40 MG tablet Take 1 tablet (40 mg total) by mouth daily. Qty: 30 tablet, Refills: 0      CONTINUE these medications which have NOT CHANGED   Details  aspirin 81 MG tablet Take 81 mg by mouth daily.    carbidopa-levodopa (SINEMET CR) 50-200 MG tablet Take 1 tablet by mouth 5 (five) times daily. Qty: 450 tablet, Refills: 1    carbidopa-levodopa (SINEMET IR) 25-100 MG tablet Take 1 tablet by mouth 3 (three) times daily. Qty: 270 tablet, Refills: 1    lansoprazole (PREVACID) 15 MG capsule Take 15 mg by mouth daily as needed (Approx 2 times weekly). gerd    Polyethyl Glycol-Propyl Glycol (SYSTANE ULTRA OP) Place 1 drop into both eyes daily.    polyethylene glycol (MIRALAX / GLYCOLAX) packet Take 17 g by mouth daily.    potassium chloride (K-DUR,KLOR-CON) 10 MEQ tablet Take 10 mEq by mouth daily.    Associated Diagnoses: Chronic combined systolic (congestive) and diastolic (congestive) heart failure (HCC)    vitamin B-12 (CYANOCOBALAMIN) 1000 MCG tablet Take 1,000 mcg by mouth daily.      STOP taking these medications     bisoprolol-hydrochlorothiazide (ZIAC) 5-6.25 MG tablet          DISCHARGE INSTRUCTIONS:   Follow-up Dr. rehabilitation 1 day  If you experience worsening of your admission  symptoms, develop shortness of breath, life threatening emergency, suicidal or homicidal thoughts you must seek medical attention immediately by calling 911 or calling your MD immediately  if symptoms less severe.  You Must read complete instructions/literature along with all the possible adverse reactions/side effects for all the Medicines you take and that have been prescribed to you. Take any new Medicines after you have completely understood and accept all the possible adverse reactions/side effects.   Please note  You were cared for by a hospitalist during your hospital stay. If you have any questions about your discharge medications or the care you received while you were in the hospital after you are discharged, you can call the unit and asked to speak with the hospitalist on call if the hospitalist that took care of you is not available. Once you are discharged, your primary care physician will handle any further medical issues. Please note  that NO REFILLS for any discharge medications will be authorized once you are discharged, as it is imperative that you return to your primary care physician (or establish a relationship with a primary care physician if you do not have one) for your aftercare needs so that they can reassess your need for medications and monitor your lab values.    Today   CHIEF COMPLAINT:   Chief Complaint  Patient presents with  . Fall    unwitness   . Leg Pain    left hip groin and knee    HISTORY OF PRESENT ILLNESS:  Ian [name]  is a 82 y.o. male presented after a fall and found to have a hip fracture Patient doing bettermental status much improved  VITAL SIGNS:  Blood pressure (!) 119/51, pulse 65, temperature 97.5 F (36.4 C), temperature source Oral, resp. rate 18, height 5\' 6"  (1.676 m), weight 140 lb 8 oz (63.7 kg), SpO2 97 %.    PHYSICAL EXAMINATION:  GENERAL:  82 y.o.-year-old patient lying in the bed with no acute distress.  EYES: Pupils  equal, round, reactive to light and accommodation. No scleral icterus. Extraocular muscles intact.  HEENT: Head atraumatic, normocephalic. Oropharynx and nasopharynx clear.  NECK:  Supple, no jugular venous distention. No thyroid enlargement, no tenderness.  LUNGS: Normal breath sounds bilaterally, no wheezing, rales,rhonchi or crepitation. No use of accessory muscles of respiration.  CARDIOVASCULAR: S1, S2 normal. 3 and a 6 systolic murmurs, no rubs, or gallops.  ABDOMEN: Soft, non-tender, non-distended. Bowel sounds present. No organomegaly or mass.  EXTREMITIES: No pedal edema, cyanosis, or clubbing.  NEUROLOGIC: Cranial nerves II through XII are intact. Muscle strength 5/5 in all extremities. Sensation intact. Gait not checked.  PSYCHIATRIC: The patient is alert and Answers questions but some confusion.  SKIN: No obvious rash, lesion, or ulcer.   DATA REVIEW:   CBC  Recent Labs Lab 01/09/16 0330  WBC 9.4  HGB 11.4*  HCT 32.2*  PLT 248    Chemistries   Recent Labs Lab 01/09/16 0330  NA 135  K 4.0  CL 97*  CO2 34*  GLUCOSE 109*  BUN 15  CREATININE 0.67  CALCIUM 8.5*    Cardiac Enzymes  Recent Labs Lab 01/07/16 1241  TROPONINI 0.03*     Management plans discussed with the patient, family and they are in agreement.  CODE STATUS:     Code Status Orders        Start     Ordered   01/06/16 0245  Full code  Continuous     01/06/16 0244    Code Status History    Date Active Date Inactive Code Status Order ID Comments User Context   10/30/2015  9:55 AM 10/30/2015  8:33 PM Full Code [id number]  Thompson Grayer, MD Inpatient   06/10/2015 11:26 AM 10/30/2015  9:55 AM DNR [id number]  Newt Minion, RN Outpatient   06/20/2014  5:10 PM 06/21/2014  5:22 PM Full Code [id number]  Lorretta Harp, MD Inpatient   01/21/2012  6:49 PM 01/22/2012  3:33 PM Full Code [id number]  Willette Pa, RN Inpatient    Advance Directive Documentation   Flowsheet Row Most Recent Value   Type of Advance Directive  Healthcare Power of Attorney  Pre-existing out of facility DNR order (yellow form or pink MOST form)  No data  "MOST" Form in Place?  No data      TOTAL TIME TAKING CARE OF THIS PATIENT: 35 minutes.  Dustin Flock M.D on 01/10/2016 at 9:45 AM  Between 7am to 6pm - Pager - [phone]  After 6pm go to [email or web address] - password [email or web address]  [facility] Office  [phone]  CC: Primary care physician; Viviana Simpler, MD

## 2016-01-10 NOTE — Progress Notes (Signed)
Physical Therapy Treatment Patient Details Name: Ian [name] MRN: [id number] DOB: [birth year] Today's Date: 01/10/2016    History of Present Illness Pt is an 82 y.o. male with a known history of Aortic valve stenosis with status post bioprosthetic valve, coronary artery disease, CABG, systolic and diastolic heart failure, hypertension, GERD, hyperlipidemia , nonischemic cardiomyopathy presented to the emergency room with fall. Patient lost balance and fell down on the driveway of his home . He was trying to roll the garbage cans down the driveway slope, he slipped and fell down and landed on left hip.  Pt is s/p anterior L THA.    PT Comments    Pt is instructed today on car transfers and bed mob with wife present, who is really the main recipient of instruction.  He is being transported to SNF today to continue rehab, and expect a fast recovery to home based on his good progression here.  Continue acutely as pt is available for visits.  Follow Up Recommendations  SNF     Equipment Recommendations  Rolling walker with 5" wheels    Recommendations for Other Services Rehab consult     Precautions / Restrictions Precautions Precautions: Fall;Anterior Hip Precaution Booklet Issued: Yes (comment) Precaution Comments: BUE and hand tremors moderate from Parkinson's Restrictions Weight Bearing Restrictions: Yes LLE Weight Bearing: Weight bearing as tolerated    Mobility  Bed Mobility Overal bed mobility: Needs Assistance Bed Mobility: Supine to Sit;Sit to Supine Rolling: Min assist Sidelying to sit: Min assist Supine to sit: Min assist Sit to supine: Min assist Sit to sidelying: Min assist General bed mobility comments: cued hand placement  Transfers Overall transfer level: Needs assistance Equipment used: Rolling walker (2 wheeled) Transfers: Sit to/from [facility] Sit to Stand: Min assist Stand pivot transfers: Min assist       General transfer  comment: reeminded 100% of the time about where to place hands  Ambulation/Gait Ambulation/Gait assistance: Min guard Ambulation Distance (Feet): 150 Feet Assistive device: Rolling walker (2 wheeled) Gait Pattern/deviations: Step-through pattern;Narrow base of support;Decreased stride length Gait velocity: reduced Gait velocity interpretation: Below normal speed for age/gender General Gait Details: O2 in tank with PT assisting, slow but no standing rests needed   Stairs            Wheelchair Mobility    Modified Rankin (Stroke Patients Only)       Balance Overall balance assessment: History of Falls Sitting-balance support: Feet supported Sitting balance-Leahy Scale: Good     Standing balance support: Bilateral upper extremity supported Standing balance-Leahy Scale: Fair Standing balance comment: could control standing with RW to allow PT to open his room door                    Cognition Arousal/Alertness: Awake/alert Behavior During Therapy: WFL for tasks assessed/performed Overall Cognitive Status: History of cognitive impairments - at baseline                      Exercises Total Joint Exercises Ankle Circles/Pumps: AROM;Both;5 reps (hold stretches) Quad Sets: AROM;Strengthening;Both;10 reps Heel Slides: AROM;Both;15 reps Hip ABduction/ADduction: AROM;Both;15 reps    General Comments        Pertinent Vitals/Pain Pain Assessment: No/denies pain    Home Living                      Prior Function            PT Goals (current  goals can now be found in the care plan section) Progress towards PT goals: Progressing toward goals    Frequency    7X/week      PT Plan Current plan remains appropriate    Co-evaluation             End of Session Equipment Utilized During Treatment: Gait belt;Oxygen Activity Tolerance: Patient tolerated treatment well;Patient limited by fatigue Patient left: in bed;with call  bell/phone within reach;with bed alarm set     Time: [id number] PT Time Calculation (min) (ACUTE ONLY): 31 min  Charges:  $Gait Training: 8-22 mins $Therapeutic Exercise: 8-22 mins                    G Codes:      Ramond Dial 01-17-16, 11:52 AM    Mee Hives, PT MS Acute Rehab Dept. Number: [place] and [place]

## 2016-01-10 NOTE — Progress Notes (Signed)
Called report to EMS & requested non-emergent transport to SNF at this time. Wenda Low [facility]

## 2016-01-10 NOTE — Progress Notes (Signed)
Called nurse to nurse report to [facility] at SNF at this time. Wenda Low [facility]

## 2016-01-10 NOTE — Clinical Social Work Placement (Signed)
   CLINICAL SOCIAL WORK PLACEMENT  NOTE  Date:  01/10/2016  Patient Details  Name: Ian [name] MRN: [id number] Date of Birth: 04-14-1929  Clinical Social Work is seeking post-discharge placement for this patient at the [place] level of care (*CSW will initial, date and re-position this form in  chart as items are completed):  Yes   Patient/family provided with [facility] Work Department's list of facilities offering this level of care within the geographic area requested by the patient (or if unable, by the patient's family).  Yes   Patient/family informed of their freedom to choose among providers that offer the needed level of care, that participate in Medicare, Medicaid or managed care program needed by the patient, have an available bed and are willing to accept the patient.  Yes   Patient/family informed of [facility]'s ownership interest in [facility] and [facility], as well as of the fact that they are under no obligation to receive care at these facilities.  PASRR submitted to EDS on       PASRR number received on       Existing PASRR number confirmed on 01/10/16     FL2 transmitted to all facilities in geographic area requested by pt/family on 01/10/16     FL2 transmitted to all facilities within larger geographic area on       Patient informed that his/her managed care company has contracts with or will negotiate with certain facilities, including the following:        Yes   Patient/family informed of bed offers received.  Patient chooses bed at  [facility])     Physician recommends and patient chooses bed at  [facility])    Patient to be transferred to  [facility]) on 01/10/16.  Patient to be transferred to facility by  (non-emergent EMS)     Patient family notified on 01/10/16 of transfer.  Name of family member notified:  [name]     PHYSICIAN       Additional Comment:     _______________________________________________ Zettie Pho, LCSW 01/10/2016, 10:18 AM

## 2016-01-10 NOTE — Progress Notes (Signed)
Subjective: 4 Days Post-Op Procedure(s) (LRB): TOTAL HIP ARTHROPLASTY ANTERIOR APPROACH (Left) Patient reports pain as mild.   Patient is well, and has had no acute complaints or problems.  Denies any CP, SOB, ABD pain. We will continue therapy today.  Plan is to go Rehab after hospital stay.  Objective: Vital signs in last 24 hours: Temp:  [97.5 F (36.4 C)-97.8 F (36.6 C)] 97.5 F (36.4 C) (09/23 0609) Pulse Rate:  [65-77] 65 (09/23 0609) Resp:  [18] 18 (09/23 0609) BP: (116-132)/(51-63) 119/51 (09/23 0609) SpO2:  [93 %-98 %] 97 % (09/23 0609)  Intake/Output from previous day: 09/22 0701 - 09/23 0700 In: 480 [P.O.:480] Out: 350 [Urine:350] Intake/Output this shift: No intake/output data recorded.   Recent Labs  01/08/16 0341 01/09/16 0330  HGB 11.4* 11.4*    Recent Labs  01/08/16 0341 01/09/16 0330  WBC 11.1* 9.4  RBC 3.60* 3.53*  HCT 32.8* 32.2*  PLT 247 248    Recent Labs  01/09/16 0330  NA 135  K 4.0  CL 97*  CO2 34*  BUN 15  CREATININE 0.67  GLUCOSE 109*  CALCIUM 8.5*   No results for input(s): LABPT, INR in the last 72 hours.  EXAM General - Patient is Alert, Appropriate and Oriented Extremity - Neurovascular intact Sensation intact distally Intact pulses distally Dorsiflexion/Plantar flexion intact No cellulitis present Compartment soft  Anterior thigh soft Leg lengths equal with no abnormal rotation. Dressing - dressing C/D/I and no drainage Motor Function - intact, moving foot and toes well on exam.   Past Medical History:  Diagnosis Date  . Allergy   . Aortic valve stenosis, acquired    a. s/p Porcine AVR 2001;  b. 09/2014 Dobut Echo: mod-sev bioprosthetic AS w/ minimal change in CO and only mild increase in gradients w/ dobutamine.   Marland Kitchen CAD (coronary artery disease)    a. s/p CABG with LIMA-LAD in 2001 with AVR. b. BMS to LCx 8/08. c. Abnl nuc/dec EF 05/2014 - s/p cath with patent LIMA to LAD, patent dominant right, mild LCx  disease, patent stent. Med rx.  . Chronic combined systolic (congestive) and diastolic (congestive) heart failure (HCC)    a. EF 50-55% in 02/2013;  b. 05/2014 Echo: EF 20-25%, Gr1 DD.  Marland Kitchen Diverticulitis   . ED (erectile dysfunction)   . Essential hypertension   . GERD (gastroesophageal reflux disease)   . Hyperlipidemia   . MALT lymphoma ([place])   . Nonischemic cardiomyopathy ([place])    a. 05/2014 Echo: EF 20-25%-->f/u cath w/ nonobs dzs.  . Osteoarthritis   . Osteoporosis   . Parkinson's disease   . Renal artery stenosis (HCC)    a. 90% RRA stenosis previously. b. Duplex 09/2014: R renal artery 60-99%, L renal artery 1-59%.  . S/P cardiac pacemaker procedure, Medtronic Adapta L  ADDRL1, 01/21/12    a. 01/2012 s/p MDT [place] PPM (ser # [id number] H).  . Symptomatic bradycardia    a. 01/2012 s/p MDT [place] PPM (ser # [id number]).  Marland Kitchen TIA (transient ischemic attack) 06/08   a. 09/2006    Assessment/Plan:   4 Days Post-Op Procedure(s) (LRB): TOTAL HIP ARTHROPLASTY ANTERIOR APPROACH (Left) Principal Problem:   Femur fracture, left (HCC) Active Problems:   Hip fracture (HCC)   Closed fracture of neck of left femur (HCC)   Fall   Left leg pain   Coronary artery disease involving coronary bypass graft of native heart without angina pectoris   Preop cardiovascular exam  Cardiomyopathy, ischemic  Estimated body mass index is 22.68 kg/m as calculated from the following:   Height as of this encounter: 5\' 6"  (1.676 m).   Weight as of this encounter: 63.7 kg (140 lb 8 oz). Advance diet Up with therapy  Plan will be for discharge to SNF today pending medical clearance.  Cleared from an orthopaedic standpoint. Lovenox 40 mg SQ daily for 14 days TED hose daily, remove at night for 6 weeks Follow up with [place] [facility] in 2 weeks for staple removal  DVT Prophylaxis - Lovenox, Foot Pumps and TED hose Weight-Bearing as tolerated to left leg  J. Cameron Proud, PA-C [place] 01/10/2016, 8:02 AM

## 2016-01-13 DIAGNOSIS — I5022 Chronic systolic (congestive) heart failure: Secondary | ICD-10-CM

## 2016-01-13 DIAGNOSIS — R410 Disorientation, unspecified: Secondary | ICD-10-CM

## 2016-01-13 DIAGNOSIS — S7292XA Unspecified fracture of left femur, initial encounter for closed fracture: Secondary | ICD-10-CM

## 2016-01-13 DIAGNOSIS — G20 Parkinson's disease: Secondary | ICD-10-CM

## 2016-01-14 ENCOUNTER — Telehealth: Payer: Self-pay | Admitting: Neurology

## 2016-01-14 NOTE — Telephone Encounter (Signed)
Ian [name] [birth year]. His wife [name] called regarding her husband. She would like you to please call her at [phone]. Thank you

## 2016-01-14 NOTE — Telephone Encounter (Signed)
Patient's wife made aware.

## 2016-01-14 NOTE — Telephone Encounter (Signed)
Cannot see patients family without patient legally

## 2016-01-14 NOTE — Telephone Encounter (Signed)
Spoke with patient's wife. She states after falling and breaking hip- patient is at [facility] in rehab for recovering. They anticipate him being there until October 13th. He is doing well physically. Walking with a walker and getting out of bed on his own. She is very worried and anxious about his mental and emotional state. She states his dementia seems to be getting worse rapidly. She would like to come in for a consult to discuss these things with Dr. Carles Collet face to face. I told her I did not know the legalities, but wasn't sure that she could come see Dr. Carles Collet without her husband present.  Dr. Carles Collet please advise.

## 2016-01-19 DIAGNOSIS — Z23 Encounter for immunization: Secondary | ICD-10-CM | POA: Diagnosis not present

## 2016-01-28 ENCOUNTER — Ambulatory Visit (INDEPENDENT_AMBULATORY_CARE_PROVIDER_SITE_OTHER): Payer: Medicare Other

## 2016-01-28 DIAGNOSIS — Z950 Presence of cardiac pacemaker: Secondary | ICD-10-CM | POA: Diagnosis not present

## 2016-01-28 DIAGNOSIS — I5022 Chronic systolic (congestive) heart failure: Secondary | ICD-10-CM

## 2016-01-29 ENCOUNTER — Telehealth: Payer: Self-pay

## 2016-01-29 NOTE — Progress Notes (Signed)
EPIC Encounter for ICM Monitoring  Patient Name: Ian [name] is a 82 y.o. male Date: 01/29/2016 Primary Care Physican: Viviana Simpler, MD Primary Cardiologist: Cro[place] Electrophysiologist: Allred Dry Weight:  unknown Bi-V Pacing:  98%       Spoke with wife.   Heart Failure questions reviewed, pt asymptomatic for fluid.  Patient had hip replacement 01/05/2016 after a fall.  Currently taking Furosemide 40 mg daily as instructed at time of hospital discharge.  She reported prior to hospitalization, he was prescribed Furosemide 80 mg daily Dr Sallyanne Kuster.  Thoracic impedance abnormal suggesting fluid accumulation 9/19 to 9/27, 9/29 to 10/3, 10/4 to 10/7, 10/8 to 10/11.  Discharged from hospital on 9/23 and in rehab x 2 weeks.  [place]hab [place]     Labs: 01/09/2016 Creatinine 0.67, BUN 15, Potassium 4.0, Sodium 135 01/07/2016 Creatinine 0.79, BUN 21, Potassium 4.2, Sodium 139 01/06/2016 Creatinine 0.82, BUN 19, Potassium 4.0, Sodium 139 12/18/2015 Creatinine 0.94, BUN 14, Potassium 4.5, Sodium 142 10/23/2015 Creatinine 0.89, BUN 17, Potassium 4.3, Sodium 139 09/23/2015 Creatinine 0.73, BUN 14, Potassium 5.2, Sodium 138  Follow-up plan: ICM clinic phone appointment on 02/02/2016.  Recommendations:  Increase Furosemide to 40 mg bid x 3 days and increase Potassium 10 meq to 2 tablets daily x 3 days.  Advised Copy of ICM check sent to Dr Sallyanne Kuster and Dr Rayann Heman for review and recommendation regarding Furosemide dosage which was decreased from 80 mg to 40 at time of hospital discharge on 01/10/2016.      ICM trend: 01/28/2016       Ian Billings, RN 01/29/2016 10:16 AM

## 2016-01-29 NOTE — Telephone Encounter (Signed)
Remote ICM transmission received.  Attempted patient call and left message to return call regarding transmission.  Requested to return call.

## 2016-01-29 NOTE — Progress Notes (Signed)
Agree with recommendation to increase furosemide and would leave him long-term on 80 mg daily unless he becomes dizzy/hypotensive. Thanks

## 2016-01-30 MED ORDER — FUROSEMIDE 80 MG PO TABS: 80.0000 mg | ORAL_TABLET | Freq: Every day | ORAL | 3 refills | Status: DC

## 2016-01-30 NOTE — Progress Notes (Signed)
Call to wife.  Advised Dr Sallyanne Kuster recommended he take Furosemide 80 mg daily for long term unless he becomes dizzy/hypotensive.  She verbalized understanding.  Next ICM remote transmission scheduled for 02/02/2016.  Today's weight is 135 lbs.  She continues to be concerned about weight loss and advised to call PCP for follow up.

## 2016-02-02 ENCOUNTER — Telehealth: Payer: Self-pay | Admitting: Internal Medicine

## 2016-02-02 ENCOUNTER — Ambulatory Visit: Payer: Medicare Other | Admitting: Internal Medicine

## 2016-02-02 ENCOUNTER — Ambulatory Visit (INDEPENDENT_AMBULATORY_CARE_PROVIDER_SITE_OTHER): Payer: Medicare Other

## 2016-02-02 DIAGNOSIS — Z950 Presence of cardiac pacemaker: Secondary | ICD-10-CM

## 2016-02-02 DIAGNOSIS — I5022 Chronic systolic (congestive) heart failure: Secondary | ICD-10-CM

## 2016-02-02 NOTE — Progress Notes (Signed)
EPIC Encounter for ICM Monitoring  Patient Name: Ian [name] is a 82 y.o. male Date: 02/02/2016 Primary Care Physican: Viviana Simpler, MD Primary Cardiologist:Croitoru Electrophysiologist: Allred Dry Weight:     unknown Bi-V Pacing:  98%        Heart Failure questions reviewed, pt asymptomatic   Thoracic impedance above baseline after taking increase Furosemide 80 mg dosage.     Labs: 01/09/2016 Creatinine 0.67, BUN 15, Potassium 4.0, Sodium 135 01/07/2016 Creatinine 0.79, BUN 21, Potassium 4.2, Sodium 139 01/06/2016 Creatinine 0.82, BUN 19, Potassium 4.0, Sodium 139 12/18/2015 Creatinine 0.94, BUN 14, Potassium 4.5, Sodium 142 10/23/2015 Creatinine 0.89, BUN 17, Potassium 4.3, Sodium 139 09/23/2015 Creatinine 0.73, BUN 14, Potassium 5.2, Sodium 138   Recommendations: No changes.  Advised to limit salt intake to 2000 mg daily.  Encouraged to call for fluid symptoms.  She understands patient is to continue on Furosemide 80 mg daily.  She asked if patient should continue to take Potassium 10 mEq 2 tablets daily or go back to the 1 tablet daily.  Advised will check with Dr Sallyanne Kuster on Potassium dosage.  Follow-up plan: ICM clinic phone appointment on 04/15/2016.  Office appointment with Dr Rayann Heman on 03/15/2016.  Copy of ICM check sent to primary cardiologist and device physician.   ICM trend: 02/02/2016       Rosalene Billings, RN 02/02/2016 11:43 AM

## 2016-02-02 NOTE — Telephone Encounter (Signed)
New Message   1. Has your device fired? No   2. Is you device beeping?  YES   3. Are you experiencing draining or swelling at device site? No   4. Are you calling to see if we received your device transmission? No   5. Have you passed out? No

## 2016-02-02 NOTE — Progress Notes (Signed)
Received: Today Regarding Potassium dosage Message Contents  Sanda Klein, MD  Rosalene Billings, RN        2 tabs of 10 mEq = 20 mEq daily please

## 2016-02-02 NOTE — Telephone Encounter (Signed)
New Message   Second call pt wife verbalized that she don't know what to do other than just cut it off  1. Has your device fired? No   2. Is you device beeping?  YES   3. Are you experiencing draining or swelling at device site? No   4. Are you calling to see if we received your device transmission? No   5. Have you passed out? No

## 2016-02-02 NOTE — Telephone Encounter (Signed)
Spoke with wife

## 2016-02-12 ENCOUNTER — Ambulatory Visit (INDEPENDENT_AMBULATORY_CARE_PROVIDER_SITE_OTHER): Payer: Medicare Other

## 2016-02-12 ENCOUNTER — Telehealth: Payer: Self-pay

## 2016-02-12 DIAGNOSIS — Z950 Presence of cardiac pacemaker: Secondary | ICD-10-CM

## 2016-02-12 DIAGNOSIS — I5022 Chronic systolic (congestive) heart failure: Secondary | ICD-10-CM

## 2016-02-12 MED ORDER — POTASSIUM CHLORIDE ER 10 MEQ PO TBCR
EXTENDED_RELEASE_TABLET | ORAL | 3 refills | Status: DC
Start: 2016-02-02 — End: 2016-07-23

## 2016-02-12 NOTE — Telephone Encounter (Signed)
Wife left message to return call due to she thinks patient is having some fluid symptoms.  Returned call and advised to send a remote transmission for review.  She reported he is having tightness in chest and not sleeping well at night.  He is sleeping on several pillows.  See ICM note for transmission review.

## 2016-02-12 NOTE — Progress Notes (Signed)
EPIC Encounter for ICM Monitoring  Patient Name: Ian [name] is a 82 y.o. male Date: 02/12/2016 Primary Care Physican: Viviana Simpler, MD Primary Cardiologist:Croitoru Electrophysiologist: Allred Dry Weight:unknown Bi-V Pacing: 98%              Received call from wife stating patient is having chest tightness and sleeping on more pillows at night.  She thinks he is retaining fluid.  Obtained remote transmission.    Thoracic impedance abnormal suggesting fluid accumulation 02/11/2016 and 02/12/2016.  Labs: 01/09/2016 Creatinine 0.67, BUN 15, Potassium 4.0, Sodium 135 01/07/2016 Creatinine 0.79, BUN 21, Potassium 4.2, Sodium 139 01/06/2016 Creatinine 0.82, BUN 19, Potassium 4.0, Sodium 139 12/18/2015 Creatinine 0.94, BUN 14, Potassium 4.5, Sodium 142 10/23/2015 Creatinine 0.89, BUN 17, Potassium 4.3, Sodium 139 09/23/2015 Creatinine 0.73, BUN 14, Potassium 5.2, Sodium 138  Recommendations:  Take Furosemide 80 mg am and 40 mg pm x 2 days only and return to 80 mg daily after 2nd day or if symptoms resolve before 2 days then return to prescribed dosage of 80 mg daily.  Wife verbalized understanding.   Entered Potassium dosage change from orders given by Dr Lorenza Cambridge on 02/02/2016 today.    Follow-up plan: ICM clinic phone appointment on 02/16/2016 to recheck fluid levels.  Copy of ICM check sent to primary cardiologist and device physician.   ICM trend: 02/12/2016       Rosalene Billings, RN 02/12/2016 10:08 AM

## 2016-02-16 ENCOUNTER — Ambulatory Visit (INDEPENDENT_AMBULATORY_CARE_PROVIDER_SITE_OTHER): Payer: Medicare Other

## 2016-02-16 DIAGNOSIS — I5022 Chronic systolic (congestive) heart failure: Secondary | ICD-10-CM

## 2016-02-16 DIAGNOSIS — Z950 Presence of cardiac pacemaker: Secondary | ICD-10-CM

## 2016-02-16 NOTE — Progress Notes (Signed)
Thank you :)

## 2016-02-16 NOTE — Progress Notes (Signed)
EPIC Encounter for ICM Monitoring  Patient Name: Ian [name] is a 82 y.o. male Date: 02/16/2016 Primary Care Physican: Viviana Simpler, MD Primary Cardiologist:Croitoru Electrophysiologist: Allred Dry Weight:unknown Bi-V Pacing: 98%          Spoke with wife.  Heart Failure questions reviewed, pt asymptomatic   Thoracic impedance returned to normal after taking extra Furosemide on 02/12/2016.    Labs: 01/09/2016 Creatinine 0.67, BUN 15, Potassium 4.0, Sodium 135 01/07/2016 Creatinine 0.79, BUN 21, Potassium 4.2, Sodium 139 01/06/2016 Creatinine 0.82, BUN 19, Potassium 4.0, Sodium 139 12/18/2015 Creatinine 0.94, BUN 14, Potassium 4.5, Sodium 142 10/23/2015 Creatinine 0.89, BUN 17, Potassium 4.3, Sodium 139 09/23/2015 Creatinine 0.73, BUN 14, Potassium 5.2, Sodium 138  Recommendations: No changes.  Advised to limit salt intake to 2000 mg daily.  Encouraged to call for fluid symptoms.    Follow-up plan: ICM clinic phone appointment on 04/15/2016.  Office appointment with Dr Rayann Heman on 03/15/2016.  Copy of ICM check sent to primary cardiologist and device physician.   ICM trend: 02/16/2016       Rosalene Billings, RN 02/16/2016 10:27 AM

## 2016-02-19 DIAGNOSIS — R2689 Other abnormalities of gait and mobility: Secondary | ICD-10-CM | POA: Diagnosis not present

## 2016-02-19 DIAGNOSIS — M6281 Muscle weakness (generalized): Secondary | ICD-10-CM | POA: Diagnosis not present

## 2016-02-20 DIAGNOSIS — Z96642 Presence of left artificial hip joint: Secondary | ICD-10-CM | POA: Diagnosis not present

## 2016-02-23 DIAGNOSIS — M6281 Muscle weakness (generalized): Secondary | ICD-10-CM | POA: Diagnosis not present

## 2016-02-23 DIAGNOSIS — R2689 Other abnormalities of gait and mobility: Secondary | ICD-10-CM | POA: Diagnosis not present

## 2016-02-24 DIAGNOSIS — R2689 Other abnormalities of gait and mobility: Secondary | ICD-10-CM | POA: Diagnosis not present

## 2016-02-24 DIAGNOSIS — M6281 Muscle weakness (generalized): Secondary | ICD-10-CM | POA: Diagnosis not present

## 2016-02-26 ENCOUNTER — Ambulatory Visit (INDEPENDENT_AMBULATORY_CARE_PROVIDER_SITE_OTHER): Payer: Medicare Other | Admitting: Internal Medicine

## 2016-02-26 ENCOUNTER — Encounter: Payer: Self-pay | Admitting: Internal Medicine

## 2016-02-26 VITALS — BP 110/70 | HR 66 | Temp 98.1°F | Wt 143.0 lb

## 2016-02-26 DIAGNOSIS — M6281 Muscle weakness (generalized): Secondary | ICD-10-CM | POA: Diagnosis not present

## 2016-02-26 DIAGNOSIS — I5042 Chronic combined systolic (congestive) and diastolic (congestive) heart failure: Secondary | ICD-10-CM | POA: Diagnosis not present

## 2016-02-26 DIAGNOSIS — R2689 Other abnormalities of gait and mobility: Secondary | ICD-10-CM | POA: Diagnosis not present

## 2016-02-26 DIAGNOSIS — G20 Parkinson's disease: Secondary | ICD-10-CM | POA: Diagnosis not present

## 2016-02-26 DIAGNOSIS — I701 Atherosclerosis of renal artery: Secondary | ICD-10-CM

## 2016-02-26 DIAGNOSIS — S72002D Fracture of unspecified part of neck of left femur, subsequent encounter for closed fracture with routine healing: Secondary | ICD-10-CM | POA: Diagnosis not present

## 2016-02-26 NOTE — Assessment & Plan Note (Signed)
This is much better Continues with rehab Discussed continuing the walker and okay to use tylenol more regularly

## 2016-02-26 NOTE — Progress Notes (Signed)
Subjective:    Patient ID: Ian [name], male    DOB: 21-Jun-1928, 82 y.o.   MRN: [id number]  HPI Here with wife for follow up of left hip fracture and rehab Home for about a month  Has ongoing pain--but slowly improving Uses tylenol occasionally Has been using a walker-- but starting with cane Continues with outpatient rehab  Parkinson's is about the same Tried a new medication via Dr Tat--bad hallucinations This is fairly stable  Did have some chest heaviness and tightness [place] the [place] after [place]hab Contacted cardiology-- noted fluid buildup Did diurese him more This is being monitored ongoing  Current Outpatient Prescriptions on File Prior to Visit  Medication Sig Dispense Refill  . aspirin 81 MG tablet Take 81 mg by mouth daily.    . bisoprolol (ZEBETA) 5 MG tablet Take 1 tablet (5 mg total) by mouth daily. 30 tablet 0  . carbidopa-levodopa (SINEMET CR) 50-200 MG tablet Take 1 tablet by mouth 5 (five) times daily. 450 tablet 1  . carbidopa-levodopa (SINEMET IR) 25-100 MG tablet Take 1 tablet by mouth 3 (three) times daily. 270 tablet 1  . furosemide (LASIX) 80 MG tablet Take 1 tablet (80 mg total) by mouth daily. 90 tablet 3  . lansoprazole (PREVACID) 15 MG capsule Take 15 mg by mouth daily as needed (Approx 2 times weekly). gerd    . Polyethyl Glycol-Propyl Glycol (SYSTANE ULTRA OP) Place 1 drop into both eyes daily.    . polyethylene glycol (MIRALAX / GLYCOLAX) packet Take 17 g by mouth daily.    . potassium chloride (K-DUR) 10 MEQ tablet Take 2 tablets (20 mEq total) daily 90 tablet 3  . vitamin B-12 (CYANOCOBALAMIN) 1000 MCG tablet Take 1,000 mcg by mouth daily.     Current Facility-Administered Medications on File Prior to Visit  Medication Dose Route Frequency Provider Last Rate Last Dose  . DOBUTamine (DOBUTREX) 1,000 mcg/mL in dextrose 5% 250 mL infusion  5-20 mcg/kg/min (Order-Specific) Intravenous Continuous Carlena Bjornstad, MD 40.8 mL/hr at 09/20/14 1415 10  mcg/kg/min at 09/20/14 1415    Allergies  Allergen Reactions  . Plavix [Clopidogrel]     Fatigue   . Pravastatin     Muscle aches  . Zocor [Simvastatin]     REACTION: dizziness  . Penicillins Rash    Has patient had a PCN reaction causing immediate rash, facial/tongue/throat swelling, SOB or lightheadedness with hypotension: YES Has patient had a PCN reaction causing severe rash involving mucus membranes or skin necrosis: NO Has patient had a PCN reaction that required hospitalization NO Has patient had a PCN reaction occurring within the last 10 years: NO If all of the above answers are "NO", then may proceed with Cephalosporin use.    Past Medical History:  Diagnosis Date  . Allergy   . Aortic valve stenosis, acquired    a. s/p Porcine AVR 2001;  b. 09/2014 Dobut Echo: mod-sev bioprosthetic AS w/ minimal change in CO and only mild increase in gradients w/ dobutamine.   Marland Kitchen CAD (coronary artery disease)    a. s/p CABG with LIMA-LAD in 2001 with AVR. b. BMS to LCx 8/08. c. Abnl nuc/dec EF 05/2014 - s/p cath with patent LIMA to LAD, patent dominant right, mild LCx disease, patent stent. Med rx.  . Chronic combined systolic (congestive) and diastolic (congestive) heart failure    a. EF 50-55% in 02/2013;  b. 05/2014 Echo: EF 20-25%, Gr1 DD.  Marland Kitchen Diverticulitis   . ED (erectile  dysfunction)   . Essential hypertension   . GERD (gastroesophageal reflux disease)   . Hyperlipidemia   . MALT lymphoma ([place])   . Nonischemic cardiomyopathy ([place])    a. 05/2014 Echo: EF 20-25%-->f/u cath w/ nonobs dzs.  . Osteoarthritis   . Osteoporosis   . Parkinson's disease   . Renal artery stenosis (HCC)    a. 90% RRA stenosis previously. b. Duplex 09/2014: R renal artery 60-99%, L renal artery 1-59%.  . S/P cardiac pacemaker procedure, Medtronic Adapta L  ADDRL1, 01/21/12    a. 01/2012 s/p MDT [place] PPM (ser # [id number]).  . Symptomatic bradycardia    a. 01/2012 s/p MDT [place] PPM  (ser # [id number]).  Marland Kitchen TIA (transient ischemic attack) 06/08   a. 09/2006    Past Surgical History:  Procedure Laterality Date  . AORTIC VALVE REPLACEMENT  2001   Porcine valve  . CARDIAC CATHETERIZATION  06/20/2014   Procedure: CORONARY/GRAFT ANGIOGRAPHY;  Surgeon: Lorretta Harp, MD;  Location: [facility] CATH LAB;  Service: Cardiovascular;;  . CATARACT EXTRACTION  06/09   left  . CORONARY ARTERY BYPASS GRAFT  2001   ( van trigt ) aortic valve replacement 2001; LIMA-LAD.  Marland Kitchen CORONARY STENT PLACEMENT  11/2007   8/09  Left Circumflex Stent by Dr Toy Care started and aggrenox stopped  . EP IMPLANTABLE DEVICE N/A 10/30/2015   Procedure: BiV Pacemaker Upgrade;  Surgeon: Thompson Grayer, MD;  Location: [facility] CV LAB;  Service: Cardiovascular;  Laterality: N/A;  . ESOPHAGOGASTRODUODENOSCOPY     multiple, Colon/ EGD benign 11/2004  . EYE SURGERY  06/18/15  . INGUINAL HERNIA REPAIR     LIH 1997Llap. bilateral hernias 1998  . LAPAROSCOPIC CHOLECYSTECTOMY  07/11   Dr.Rosenbower  . NM MYOCAR PERF WALL MOTION  11/22/2007   mild septal ischemia,EF 66%  . PACEMAKER INSERTION  01/21/2012   Medtronic Adapta L implanted for complete heart block  . PERMANENT PACEMAKER INSERTION N/A 01/21/2012   Procedure: PERMANENT PACEMAKER INSERTION;  Surgeon: Thompson Grayer, MD;  Location: [facility] CATH LAB;  Service: Cardiovascular;  Laterality: N/A;  . PILONIDAL CYST / SINUS EXCISION  1953  . SQUAMOUS CELL CARCINOMA EXCISION  3/13   back  . TOTAL HIP ARTHROPLASTY Left 01/06/2016   Procedure: TOTAL HIP ARTHROPLASTY ANTERIOR APPROACH;  Surgeon: Hessie Knows, MD;  Location: [facility] ORS;  Service: Orthopedics;  Laterality: Left;    Family History  Problem Relation Age of Onset  . Asthma Father   . Heart disease Brother     Social History   Social History  . Marital status: Married    Spouse name: N/A  . Number of children: 3  . Years of education: N/A   Occupational History  . retired- [employer]   . part-time  sub/courier for schools- now only rarely    Social History Main Topics  . Smoking status: Former Smoker    Packs/day: 1.00    Years: 5.00    Types: Cigarettes    Quit date: 06/06/1963  . Smokeless tobacco: Never Used  . Alcohol use No     Comment: very rare  . Drug use: No  . Sexual activity: No   Other Topics Concern  . Not on file   Social History Narrative   Has living will   Wife, then son [name], hold health care POA   Requests DNR --done   Requests no tube feeds if cognitively unaware   Review of Systems Worsening eyesight--no  diagnosis. Trouble reading now. Seen at [place] as well as [employer] is okay but weight is down from the surgery/rehab Sleeps okay in general     Objective:   Physical Exam  Constitutional: He appears well-developed. No distress.  Neck: Normal range of motion. Neck supple. No thyromegaly present.  Cardiovascular: Normal rate, regular rhythm and normal heart sounds.  Exam reveals no gallop.   No murmur heard. Pulmonary/Chest: Effort normal and breath sounds normal. No respiratory distress. He has no wheezes. He has no rales.  Musculoskeletal: He exhibits no edema.  Lymphadenopathy:    He has no cervical adenopathy.  Neurological:  Stable resting tremor  Psychiatric: He has a normal mood and affect. His behavior is normal.          Assessment & Plan:

## 2016-02-26 NOTE — Assessment & Plan Note (Signed)
Did have recent exacerbation found as outpatient and successfully diuresed Doing okay now

## 2016-02-26 NOTE — Progress Notes (Signed)
Pre visit review using our clinic review tool, if applicable. No additional management support is needed unless otherwise documented below in the visit note. 

## 2016-02-26 NOTE — Assessment & Plan Note (Signed)
Stable status Continues with Dr Carles Collet

## 2016-02-26 NOTE — Patient Instructions (Signed)
It would be fine to take tylenol 500mg  three or four times a day regularly (at least for the next few months till your hip is completely healed.

## 2016-03-01 DIAGNOSIS — M6281 Muscle weakness (generalized): Secondary | ICD-10-CM | POA: Diagnosis not present

## 2016-03-01 DIAGNOSIS — R2689 Other abnormalities of gait and mobility: Secondary | ICD-10-CM | POA: Diagnosis not present

## 2016-03-03 DIAGNOSIS — R2689 Other abnormalities of gait and mobility: Secondary | ICD-10-CM | POA: Diagnosis not present

## 2016-03-03 DIAGNOSIS — M6281 Muscle weakness (generalized): Secondary | ICD-10-CM | POA: Diagnosis not present

## 2016-03-05 DIAGNOSIS — M6281 Muscle weakness (generalized): Secondary | ICD-10-CM | POA: Diagnosis not present

## 2016-03-05 DIAGNOSIS — R2689 Other abnormalities of gait and mobility: Secondary | ICD-10-CM | POA: Diagnosis not present

## 2016-03-09 DIAGNOSIS — M6281 Muscle weakness (generalized): Secondary | ICD-10-CM | POA: Diagnosis not present

## 2016-03-09 DIAGNOSIS — R2689 Other abnormalities of gait and mobility: Secondary | ICD-10-CM | POA: Diagnosis not present

## 2016-03-10 DIAGNOSIS — M6281 Muscle weakness (generalized): Secondary | ICD-10-CM | POA: Diagnosis not present

## 2016-03-10 DIAGNOSIS — R2689 Other abnormalities of gait and mobility: Secondary | ICD-10-CM | POA: Diagnosis not present

## 2016-03-12 DIAGNOSIS — R2689 Other abnormalities of gait and mobility: Secondary | ICD-10-CM | POA: Diagnosis not present

## 2016-03-12 DIAGNOSIS — M6281 Muscle weakness (generalized): Secondary | ICD-10-CM | POA: Diagnosis not present

## 2016-03-15 ENCOUNTER — Ambulatory Visit (INDEPENDENT_AMBULATORY_CARE_PROVIDER_SITE_OTHER): Payer: Medicare Other | Admitting: Internal Medicine

## 2016-03-15 ENCOUNTER — Encounter: Payer: Self-pay | Admitting: Internal Medicine

## 2016-03-15 VITALS — BP 110/68 | HR 60 | Ht 66.0 in | Wt 143.6 lb

## 2016-03-15 DIAGNOSIS — I701 Atherosclerosis of renal artery: Secondary | ICD-10-CM | POA: Diagnosis not present

## 2016-03-15 DIAGNOSIS — I350 Nonrheumatic aortic (valve) stenosis: Secondary | ICD-10-CM | POA: Diagnosis not present

## 2016-03-15 DIAGNOSIS — I5022 Chronic systolic (congestive) heart failure: Secondary | ICD-10-CM | POA: Diagnosis not present

## 2016-03-15 DIAGNOSIS — I442 Atrioventricular block, complete: Secondary | ICD-10-CM

## 2016-03-15 LAB — CUP PACEART INCLINIC DEVICE CHECK
Battery Voltage: 3.01 V
Brady Statistic RA Percent Paced: 60 %
Implantable Lead Implant Date: 20131004
Implantable Lead Location: 753858
Implantable Lead Location: 753859
Implantable Lead Model: 5076
Lead Channel Impedance Value: 475 Ohm
Lead Channel Impedance Value: 862.5 Ohm
Lead Channel Pacing Threshold Amplitude: 1 V
Lead Channel Pacing Threshold Amplitude: 1.875 V
Lead Channel Pacing Threshold Pulse Width: 0.4 ms
Lead Channel Pacing Threshold Pulse Width: 1 ms
Lead Channel Setting Sensing Sensitivity: 4 mV
MDC IDC LEAD IMPLANT DT: 20131004
MDC IDC LEAD IMPLANT DT: 20170713
MDC IDC LEAD LOCATION: 753860
MDC IDC MSMT LEADCHNL RA SENSING INTR AMPL: 3.8 mV
MDC IDC MSMT LEADCHNL RV IMPEDANCE VALUE: 462.5 Ohm
MDC IDC MSMT LEADCHNL RV PACING THRESHOLD AMPLITUDE: 1 V
MDC IDC MSMT LEADCHNL RV PACING THRESHOLD PULSEWIDTH: 0.4 ms
MDC IDC MSMT LEADCHNL RV SENSING INTR AMPL: 10 mV
MDC IDC PG IMPLANT DT: 20170713
MDC IDC PG SERIAL: 7933792
MDC IDC SESS DTM: 20171127144241
MDC IDC SET LEADCHNL LV PACING AMPLITUDE: 2.375
MDC IDC SET LEADCHNL LV PACING PULSEWIDTH: 1 ms
MDC IDC SET LEADCHNL RA PACING AMPLITUDE: 2 V
MDC IDC SET LEADCHNL RV PACING AMPLITUDE: 2.5 V
MDC IDC SET LEADCHNL RV PACING PULSEWIDTH: 0.4 ms
MDC IDC STAT BRADY RV PERCENT PACED: 99 %

## 2016-03-15 LAB — BASIC METABOLIC PANEL
BUN: 12 mg/dL (ref 7–25)
CALCIUM: 9.7 mg/dL (ref 8.6–10.3)
CO2: 32 mmol/L — AB (ref 20–31)
CREATININE: 0.75 mg/dL (ref 0.70–1.11)
Chloride: 97 mmol/L — ABNORMAL LOW (ref 98–110)
Glucose, Bld: 94 mg/dL (ref 65–99)
Potassium: 4.3 mmol/L (ref 3.5–5.3)
SODIUM: 139 mmol/L (ref 135–146)

## 2016-03-15 NOTE — Progress Notes (Signed)
Electrophysiology Office Note   Date:  03/15/2016   ID:  Ian [name], DOB 29-May-1928, MRN [id number]  PCP:  Viviana Simpler, MD  Cardiologist:  Drs Gwenlyn Found Croitoru Primary Electrophysiologist: Thompson Grayer, MD    Chief Complaint  Patient presents with  . Follow-up    CHB     History of Present Illness: Ian [name] is a 82 y.o. male who presents today for electrophysiology evaluation.   He has done well since his upgrade to CRT-P.  He continues to have tremors of parkinsons.  He has some fatigue and SOB but really is able to do most of what he desires.  He also has moderate to severe aortic prosthesis stenosis.  He has noticed 3 pillow orthopnea.  Lasix was increased by Sharman Cheek.  Denies procedure related complications.  Today, he denies symptoms of palpitations, chest pain,   lower extremity edema, claudication, dizziness, presyncope, syncope, or bleeding. The patient is tolerating medications without difficulties and is otherwise without complaint today.    Past Medical History:  Diagnosis Date  . Allergy   . Aortic valve stenosis, acquired    a. s/p Porcine AVR 2001;  b. 09/2014 Dobut Echo: mod-sev bioprosthetic AS w/ minimal change in CO and only mild increase in gradients w/ dobutamine.   Marland Kitchen CAD (coronary artery disease)    a. s/p CABG with LIMA-LAD in 2001 with AVR. b. BMS to LCx 8/08. c. Abnl nuc/dec EF 05/2014 - s/p cath with patent LIMA to LAD, patent dominant right, mild LCx disease, patent stent. Med rx.  . Chronic combined systolic (congestive) and diastolic (congestive) heart failure    a. EF 50-55% in 02/2013;  b. 05/2014 Echo: EF 20-25%, Gr1 DD.  Marland Kitchen Diverticulitis   . ED (erectile dysfunction)   . Essential hypertension   . GERD (gastroesophageal reflux disease)   . Hyperlipidemia   . MALT lymphoma ([place])   . Nonischemic cardiomyopathy ([place])    a. 05/2014 Echo: EF 20-25%-->f/u cath w/ nonobs dzs.  . Osteoarthritis   . Osteoporosis   . Parkinson's  disease   . Renal artery stenosis (HCC)    a. 90% RRA stenosis previously. b. Duplex 09/2014: R renal artery 60-99%, L renal artery 1-59%.  . S/P cardiac pacemaker procedure, Medtronic Adapta L  ADDRL1, 01/21/12    a. 01/2012 s/p MDT [place] PPM (ser # [id number]).  . Symptomatic bradycardia    a. 01/2012 s/p MDT [place] PPM (ser # [id number]).  Marland Kitchen TIA (transient ischemic attack) 06/08   a. 09/2006   Past Surgical History:  Procedure Laterality Date  . AORTIC VALVE REPLACEMENT  2001   Porcine valve  . CARDIAC CATHETERIZATION  06/20/2014   Procedure: CORONARY/GRAFT ANGIOGRAPHY;  Surgeon: Lorretta Harp, MD;  Location: [facility] CATH LAB;  Service: Cardiovascular;;  . CATARACT EXTRACTION  06/09   left  . CORONARY ARTERY BYPASS GRAFT  2001   ( van trigt ) aortic valve replacement 2001; LIMA-LAD.  Marland Kitchen CORONARY STENT PLACEMENT  11/2007   8/09  Left Circumflex Stent by Dr Toy Care started and aggrenox stopped  . EP IMPLANTABLE DEVICE N/A 10/30/2015   Procedure: BiV Pacemaker Upgrade;  Surgeon: Thompson Grayer, MD;  Location: [facility] CV LAB;  Service: Cardiovascular;  Laterality: N/A;  . ESOPHAGOGASTRODUODENOSCOPY     multiple, Colon/ EGD benign 11/2004  . EYE SURGERY  06/18/15  . INGUINAL HERNIA REPAIR     LIH 1997Llap. bilateral hernias 1998  . LAPAROSCOPIC CHOLECYSTECTOMY  07/11   Dr.Rosenbower  . NM MYOCAR PERF WALL MOTION  11/22/2007   mild septal ischemia,EF 66%  . PACEMAKER INSERTION  01/21/2012   Medtronic Adapta L implanted for complete heart block  . PERMANENT PACEMAKER INSERTION N/A 01/21/2012   Procedure: PERMANENT PACEMAKER INSERTION;  Surgeon: Thompson Grayer, MD;  Location: [facility] CATH LAB;  Service: Cardiovascular;  Laterality: N/A;  . PILONIDAL CYST / SINUS EXCISION  1953  . SQUAMOUS CELL CARCINOMA EXCISION  3/13   back  . TOTAL HIP ARTHROPLASTY Left 01/06/2016   Procedure: TOTAL HIP ARTHROPLASTY ANTERIOR APPROACH;  Surgeon: Hessie Knows, MD;  Location: [facility] ORS;   Service: Orthopedics;  Laterality: Left;     Current Outpatient Prescriptions  Medication Sig Dispense Refill  . aspirin 81 MG tablet Take 81 mg by mouth daily.    . bisoprolol (ZEBETA) 5 MG tablet Take 1 tablet (5 mg total) by mouth daily. 30 tablet 0  . carbidopa-levodopa (SINEMET CR) 50-200 MG tablet Take 1 tablet by mouth 5 (five) times daily. 450 tablet 1  . carbidopa-levodopa (SINEMET IR) 25-100 MG tablet Take 1 tablet by mouth 3 (three) times daily. 270 tablet 1  . furosemide (LASIX) 80 MG tablet Take 1 tablet (80 mg total) by mouth daily. 90 tablet 3  . lansoprazole (PREVACID) 15 MG capsule Take 15 mg by mouth daily as needed (Approx 2 times weekly). gerd    . Polyethyl Glycol-Propyl Glycol (SYSTANE ULTRA OP) Place 1 drop into both eyes daily.    . polyethylene glycol (MIRALAX / GLYCOLAX) packet Take 17 g by mouth daily.    . potassium chloride (K-DUR) 10 MEQ tablet Take 2 tablets (20 mEq total) daily 90 tablet 3  . vitamin B-12 (CYANOCOBALAMIN) 1000 MCG tablet Take 1,000 mcg by mouth daily.     No current facility-administered medications for this visit.    Facility-Administered Medications Ordered in Other Visits  Medication Dose Route Frequency Provider Last Rate Last Dose  . DOBUTamine (DOBUTREX) 1,000 mcg/mL in dextrose 5% 250 mL infusion  5-20 mcg/kg/min (Order-Specific) Intravenous Continuous Carlena Bjornstad, MD 40.8 mL/hr at 09/20/14 1415 10 mcg/kg/min at 09/20/14 1415    Allergies:   Plavix [clopidogrel]; Pravastatin; Zocor [simvastatin]; and Penicillins   Social History:  The patient  reports that he quit smoking about 52 years ago. His smoking use included Cigarettes. He has a 5.00 pack-year smoking history. He has never used smokeless tobacco. He reports that he does not drink alcohol or use drugs.   Family History:  The patient's family history includes Asthma in his father; Heart disease in his brother.    ROS:  Please see the history of present illness.   All  other systems are reviewed and negative.    PHYSICAL EXAM: VS:  BP 110/68   Pulse 60   Ht 5\' 6"  (1.676 m)   Wt 143 lb 9.6 oz (65.1 kg)   BMI 23.18 kg/m  , BMI Body mass index is 23.18 kg/m. GEN: Well nourished, well developed, in no acute distress  HEENT: normal  Neck: no JVD, carotid bruits, or masses Cardiac: RRR (Paced), 2/6 SEM LUSB which is late peaking Respiratory:  clear to auscultation bilaterally, normal work of breathing GI: soft, nontender, nondistended, + BS MS: no deformity or atrophy  Skin: warm and dry, device pocket is well healed Neuro:  Very prominent parkinsonian tremor Psych: euthymic mood, full affect   Device interrogation is reviewed today in detail.  See PaceArt for details.   Recent  Labs: 09/23/2015: Magnesium 2.1 12/18/2015: ALT <3 01/09/2016: BUN 15; Creatinine, Ser 0.67; Hemoglobin 11.4; Platelets 248; Potassium 4.0; Sodium 135    Lipid Panel     Component Value Date/Time   CHOL 163 03/29/2012 1138   TRIG 75.0 03/29/2012 1138   HDL 54.80 03/29/2012 1138   CHOLHDL 3 03/29/2012 1138   VLDL 15.0 03/29/2012 1138   LDLCALC 93 03/29/2012 1138   LDLDIRECT 98.3 07/21/2006 1521     Wt Readings from Last 3 Encounters:  03/15/16 143 lb 9.6 oz (65.1 kg)  02/26/16 143 lb (64.9 kg)  01/06/16 140 lb 8 oz (63.7 kg)     ASSESSMENT AND PLAN:  1.  Chronic systolic dysfunction/ complete heart block Doing well s/p upgrade to CRT-P Followed in ICM device clinic Check bmet, bnp today 2 gram sodium restriction No medicine changes today  Normal biV pacemaker function See Pace Art report No changes today  Repeat echo in 3 months at time of follow-up with primary cardiologist Merlin Return to see EP NP in 1 year.  If EF has not improved by echo in 3 months, would advise closer follow-up with EP NP for device optimization protocol.   SignedThompson Grayer, MD  03/15/2016 1:52 PM     [place]  [address] [phone] (office) [phone] (fax)

## 2016-03-15 NOTE — Patient Instructions (Addendum)
Medication Instructions:  Your physician recommends that you continue on your current medications as directed. Please refer to the Current Medication list given to you today.   Labwork: Your physician recommends that you return for lab work today: BMP/BNP    Testing/Procedures: Your physician has requested that you have an echocardiogram. Echocardiography is a painless test that uses sound waves to create images of your heart. It provides your doctor with information about the size and shape of your heart and how well your heart's chambers and valves are working. This procedure takes approximately one hour. There are no restrictions for this procedure.----in 3 months      Follow-Up:   ICM---Laurie Short, RN will call to set up montly monitoring    Remote monitoring is used to monitor your Pacemaker  from home. This monitoring reduces the number of office visits required to check your device to one time per year. It allows [place] to keep an eye on the functioning of your device to ensure it is working properly. You are scheduled for a device check from home on 06/14/16. You may send your transmission at any time that day. If you have a wireless device, the transmission will be sent automatically. After your physician reviews your transmission, you will receive a postcard with your next transmission date.   Your physician recommends that you schedule a follow-up appointment in: 3 months with Dr C and 12 months with Chanetta Marshall, NP     Low-Sodium Eating Plan Sodium raises blood pressure and causes water to be held in the body. Getting less sodium from food will help lower your blood pressure, reduce any swelling, and protect your heart, liver, and kidneys. We get sodium by adding salt (sodium chloride) to food. Most of our sodium comes from canned, boxed, and frozen foods. Restaurant foods, fast foods, and pizza are also very high in sodium. Even if you take medicine to lower your blood  pressure or to reduce fluid in your body, getting less sodium from your food is important. What is my plan? Most people should limit their sodium intake to 2,300 mg a day. Your health care provider recommends that you limit your sodium intake to 2 grams a day. What do I need to know about this eating plan? For the low-sodium eating plan, you will follow these general guidelines:  Choose foods with a % Daily Value for sodium of less than 5% (as listed on the food label).  Use salt-free seasonings or herbs instead of table salt or sea salt.  Check with your health care provider or pharmacist before using salt substitutes.  Eat fresh foods.  Eat more vegetables and fruits.  Limit canned vegetables. If you do use them, rinse them well to decrease the sodium.  Limit cheese to 1 oz (28 g) per day.  Eat lower-sodium products, often labeled as "lower sodium" or "no salt added."  Avoid foods that contain monosodium glutamate (MSG). MSG is sometimes added to [place] food and some canned foods.  Check food labels (Nutrition Facts labels) on foods to learn how much sodium is in one serving.  Eat more home-cooked food and less restaurant, buffet, and fast food.  When eating at a restaurant, ask that your food be prepared with less salt, or no salt if possible. How do I read food labels for sodium information? The Nutrition Facts label lists the amount of sodium in one serving of the food. If you eat more than one serving, you must multiply  the listed amount of sodium by the number of servings. Food labels may also identify foods as:  Sodium free-Less than 5 mg in a serving.  Very low sodium-35 mg or less in a serving.  Low sodium-140 mg or less in a serving.  Light in sodium-50% less sodium in a serving. For example, if a food that usually has 300 mg of sodium is changed to become light in sodium, it will have 150 mg of sodium.  Reduced sodium-25% less sodium in a serving. For example, if  a food that usually has 400 mg of sodium is changed to reduced sodium, it will have 300 mg of sodium. What foods can I eat? Grains  Low-sodium cereals, including oats, puffed wheat and rice, and shredded wheat cereals. Low-sodium crackers. Unsalted rice and pasta. Lower-sodium bread. Vegetables  Frozen or fresh vegetables. Low-sodium or reduced-sodium canned vegetables. Low-sodium or reduced-sodium tomato sauce and paste. Low-sodium or reduced-sodium tomato and vegetable juices. Fruits  Fresh, frozen, and canned fruit. Fruit juice. Meat and Other Protein Products  Low-sodium canned tuna and salmon. Fresh or frozen meat, poultry, seafood, and fish. Lamb. Unsalted nuts. Dried beans, peas, and lentils without added salt. Unsalted canned beans. Homemade soups without salt. Eggs. Dairy  Milk. Soy milk. Ricotta cheese. Low-sodium or reduced-sodium cheeses. Yogurt. Condiments  Fresh and dried herbs and spices. Salt-free seasonings. Onion and garlic powders. Low-sodium varieties of mustard and ketchup. Fresh or refrigerated horseradish. Lemon juice. Fats and Oils  Reduced-sodium salad dressings. Unsalted butter. Other  Unsalted popcorn and pretzels. The items listed above may not be a complete list of recommended foods or beverages. Contact your dietitian for more options.  What foods are not recommended? Grains  Instant hot cereals. Bread stuffing, pancake, and biscuit mixes. Croutons. Seasoned rice or pasta mixes. Noodle soup cups. Boxed or frozen macaroni and cheese. Self-rising flour. Regular salted crackers. Vegetables  Regular canned vegetables. Regular canned tomato sauce and paste. Regular tomato and vegetable juices. Frozen vegetables in sauces. Salted [place] fries. Olives. Angie Fava. Relishes. Sauerkraut. Salsa. Meat and Other Protein Products  Salted, canned, smoked, spiced, or pickled meats, seafood, or fish. Bacon, ham, sausage, hot dogs, corned beef, chipped beef, and packaged luncheon  meats. Salt pork. Jerky. Pickled herring. Anchovies, regular canned tuna, and sardines. Salted nuts. Dairy  Processed cheese and cheese spreads. Cheese curds. Blue cheese and cottage cheese. Buttermilk. Condiments  Onion and garlic salt, seasoned salt, table salt, and sea salt. Canned and packaged gravies. Worcestershire sauce. Tartar sauce. Barbecue sauce. Teriyaki sauce. Soy sauce, including reduced sodium. Steak sauce. Fish sauce. Oyster sauce. Cocktail sauce. Horseradish that you find on the shelf. Regular ketchup and mustard. Meat flavorings and tenderizers. Bouillon cubes. Hot sauce. Tabasco sauce. Marinades. Taco seasonings. Relishes. Fats and Oils  Regular salad dressings. Salted butter. Margarine. Ghee. Bacon fat. Other  Potato and tortilla chips. Corn chips and puffs. Salted popcorn and pretzels. Canned or dried soups. Pizza. Frozen entrees and pot pies. The items listed above may not be a complete list of foods and beverages to avoid. Contact your dietitian for more information.  This information is not intended to replace advice given to you by your health care provider. Make sure you discuss any questions you have with your health care provider. Document Released: 09/25/2001 Document Revised: 09/11/2015 Document Reviewed: 02/07/2013 Elsevier Interactive Patient Education  2017 [employer].

## 2016-03-16 LAB — BRAIN NATRIURETIC PEPTIDE: Brain Natriuretic Peptide: 209.8 pg/mL — ABNORMAL HIGH (ref <100)

## 2016-03-24 NOTE — Progress Notes (Signed)
Ian [name] was seen today in the movement disorders clinic for f/u.  He is accompanied by his wife who supplements the history.  The first symptom(s) the patient noticed was tremor in the R hand when he would use the hand or have a cup of coffee in it.  His wife believes that this began in 2004.  He was referred to Dr. Tilden Dome.  He tried some medication and according to his wife, he kept having adverse SE.  He was on mirapex and sinemet but reported dizziness and bad dreams with both.   He was referred to Dr. Yevonne Pax in about 2010 because he was interested in DBS but the patient was concerned about risks of the surgery and potential side effects.    09/28/12  I tried to change the levodopa back to the IR formulation but he did not like it because he was too drowsy and went back to the CR formulation.  , 2 po qid and carbidopa/levodopa 50/200 at night.    He has continued to have more tremor after the artane was d/c.  However, this has become less bothersome for him over time.  He does notice that sometimes the medication wears off quickly and sometimes it does not.  It seems inconsistent.  03/06/13 update:  The patient is accompanied by his wife who supplements the history.  Last visit, we tried to add entacapone to each of the 4 carbidopa/levodopa dosing times.  He called me because of nausea and we decreased it to twice a day dosing.  He continued to have nausea and the medication was discontinued.  The patient states that the medication makes his arms feel "heavy." He has stopped the carbidopa/levodopa 50/200 at night as he does not think it was beneficial.  Sometimes, he will wake up in the middle of the night and take an extra of the carbidopa/levodopa CR 25 100.  He and his wife have noted some jerking spells at night.  He sleeps very restless. I did review notes in his chart since last visit.  He went to the emergency room on September 21 with a sensation of palpitations and tachycardia.  His  pacemaker was interrogated and there were no problems and he was subsequently sent home.  06/11/13 update:  This patient is accompanied in the office by his spouse who supplements the history.  Pt is on carbidopa/levodopa 25/100 CR - 2 in the AM and 6 other dosages throughout the day.  When he awakens in the middle of the night he may take a few extra dosages and he estimates he takes a total daily levodopa dose of 1000-1200 mg per day.  He has been very senstive to medication and has not been able to tolerate the immediate release carbidopa/levodopa, entacapone or klonopin (made arms/legs hurt).  Tremor feels like it is getting worse.  Vision is getting worse but he went to the eye doctor and his vision was stable.  He feels like the levodopa only lasts 1.5-2 hrs.  No falls.  Is exercising.    08/13/13 update:   This patient is accompanied in the office by his spouse who supplements the history.  Pt is on carbidopa/levodopa 25/100, and takes approximately 8-10 tablets per day.  Last visit, I also added amantadine to see if that would help the tremor. He does think that has helped.  He presents today to try and change to rytary to see if it would last longer.  He does not wish to  try duopa.  He denies hallucinations, lightheadedness, falls, syncope or dyskinesia.     10/01/13 update:  Last visit, I changed the patient to rytary.  It turned out to be very expensive as the patient had no RX coverage, which I did not realize.  When he changed back, the IR formulation was accidentally called in instead of the CR prepartation (and he didn't tolerate the IR in the past).  The patient also didn't notice that and he isn't even sure now what he is taking but knows he is taking 2 po qid of either the CR or IR.  He is having hallucinations.  He is having swelling in the feet and legs as well.   They saw cardiology last week and were started on lasix and they didn't think that it made a difference.  They are worried  about a discoloration in legs.  He has lost weight.  Had one fall on May 10.  Walking into bedroom, got lightheaded and hit his back on the dresser.  He didn't think that he hit his head at the time but now that they think that he did as he has had hallucinations since.  However, looking back at our notes, it was 5/7 when he called here to d/c the rytary and changed to the IR levodopa it was 5/10 when he fell so timing was likely just coincidental as he had likely just changed back to the IR preparation.  11/12/13 update:  The patient was seen back in followup in the neurology clinic, accompanied by his wife who helps to supplement the history.  Patient has a history of Parkinson's disease.  Last visit, I changed his levodopa back to the CR preparation to see if that would help his hallucinations.  He is currently on 2 tablets by mouth 4 times per day (8am/12/4pm/8pm).  Today, the patient states that hallucinations continue.  He states that he continues to see people.   Medication helps tremor when he first takes it and then the tremor comes back before the next dose.   Last visit, I told him to discontinue his amantadine because of complaints of swelling.  I did review records since our last visit.  Because he noticed no changes in swelling after discontinuation of the amantadine, he followed up with his primary care physician.  His primary care physician felt that the swelling was likely due to venous insufficiency and he recommended that the patient restart the amantadine and he has.  The patient does state that he doesn't think that he had any break between d/c the IR carbidopa/levodopa 25/100, starting the CR version and when he restarted the amantadine.   The patient did see his cardiologist and he saw no cardiac reason for the edema.  He did not want to increase the patient's Lasix further because of complaints of orthostasis and dizziness.  No significant etiology was determined for his weight loss  either.  01/07/14 update:  The patient was seen today in followup in the neurology clinic, accompanied by his wife who helps to supplement the history.  The patient has switched back on his own from the CR version of carbidopa/levodopa to the immediate release form.  He is on 2 po 5 times per day (8am and then every 3 hours).  He is complaining about more tremor and more balance issues.  No falls but near falls.  He is off of amantadine.  Hallucinations went away after it was d/c but tremor definitely picked up.  Leg swelling is better but not gone.  Some memory problems.  No driving since march, 2015.    05/06/14 update:  The patient is seen in f/u, accompanied by his wife who supplements the history.   He is on carbidopa/levodopa 25/100 CR, 2 po 5 times per day (8am and then every 3 hours). He continues to c/o increasing tremor.  States that he is having m. Cramping at nighttime.  His last dose is at 8pm and bedtime is at 10 pm and cramping will start waking him at 3:30 am. Asks about trying to take a combination of one CR and one IR and see how that works.   He states that his memory is "going downhill" although his wife is not quite as sure.  He doesn't drive and hasn't for a year but has not trouble with pills.    08/06/14 update:  The patient is following up today, accompanied by his wife who supplements the history.  Last visit, the patient wanted to try a combination of carbidopa levodopa 25/100 immediate release and extended release so that he was taking 1 pill of each 5 times per day; however he really never did that and is still taking a carbidopa/levodopa 25/100 IR, 2 po 5 times a day.  We did add a carbidopa/levodopa 50/200 at night to see if that would help with his cramping;  The cramping is better at night and he only seems to have it if he has swelling.  His wife mentions that memory is not quite as good as it used to be.  He has some difficulty remembering previous conversations and having word  finding difficulties.  It is not a major issue, but it is something she is noticing.  Much has happened in his other medical history since last visit.  I reviewed those records.  The patient had a nuclear stress test on 06/13/2014, demonstrating a high risk study with a large area of ischemia in the LAD artery distribution and a fixed defect in the inferior wall.  An echocardiogram demonstrated a left ventricular ejection fraction of approximately 20%, which was much decreased compared to his prior study of 50-55%.  A heart catheterization was subsequently scheduled and performed on 06/20/2014.  There was no evidence of ischemia to account for the dropped ejection fraction.  It was felt that he was likely not a candidate for an ICD and they talked about CRT and decided to hold on that.  They saw Dr. Rayann Heman yesterday.  12/03/14 update:  The patient presents today, accompanied by his wife who supplements the history.  The records that were made available to me were reviewed.  Last visit, we talked about our limited options, and ultimately decided to try a combination of carbidopa/levodopa 25/100 IR and carbidopa/levodopa 25/100 CR, one tablet each of these 5 times per day.  There was some misunderstanding and he ended up taking carbidopa/levodopa 25/100 IR in addition to carbidopa/levodopa 50/200 CR, 5 times per day.  He was then taking the carbidopa/levodopa 50/200 at night as well.  Overall, this helped the tremor but hallucinations picked up and therefore he went back to carbidopa/levodopa 25/100 IR, 2 po 5 times a day.  He is having more difficulty opening his hands in the morning.  Pts biggest c/o is vision change and he thinks that it is from his PD meds.  Been to [facility] in November and I reviewed those records and they said that they thought that it was primarily surface related  but didn't think that it was significant.  He tried eye drops for dry eye and nothing helped.  He asks me multiple times what else he  can do.  His wife also asks me about a referral for a consultation regarding focused ultrasound.  04/04/15 update:  The patient is following up today, accompanied by his wife who supplements the history.  He has a history of Parkinson's disease.  He is on carbidopa/levodopa 25/100, 2 tablets 5 times per day and he went back on carbidopa/levodopa 50/200 qhs.  Last visit, he would not levodopa was affecting his vision, although I did not.  Regardless, we decided to stop the medication for a few days.  He was not able to tolerate being off the medication because tremor was so bad and ended up restarting it fairly quickly after stopping it.  Tremor continues to be bothersome and asks if there is anything else he can do.  Depression is getting worse because of that.  No suicidal or homicidal ideation.  He is doing yoga with his wife and enjoys that.  He has had no falls but has had some near falls.  I did refer him to Dr. Sanda Klein regarding his vision change and he is going in Jan (pt moved appt back).  His wife had also requested a referral last visit to [facility] to possible focused ultrasound, but ultimately they decided that they did not want to go to the appointment.  I think that is probably a good decision as focused u/s is not indicated yet for Parkinsons disease.  They do bring in a brochure today about duopa and ask me about that today.  Also saw an article in paper about study Dr. Jimmey Ralph is doing at [facility] and he called the number.  He showed me the article today and it was for patients who have had little weak body dementia and Parkinson's dementia with hallucinations.  He has begun to have a few more hallucinations since last visit, but reports that he knows that they are not real.  07/10/15 update:  The patient is following up today, accompanied by his wife who supplements the history.  He has a history of Parkinson's disease.  He is on carbidopa/levodopa 25/100, 2 tablets 5 times per day (8/11/2/5/8) in  addition to carbidopa/levodopa 50/200 at night (10pm). He thinks that the medication wears off before next visit and asks about taking the 50/200 in addition to one 25/100.  I reminded him that he accidentally tried that last august but he had more hallucinations.  He states that he just tried that recently with the 25/100 just like he did last august and didn't have the hallucinations.  He did try Neupro since our last visit and worked up to 4 mg.  He really did not find this beneficial.  He did not have side effects with that.  It did not make hallucinations worse, and in fact he thought they were potentially even better.  He did not want to go up to 6 mg and ended up discontinuing the medication.  He has not had any falls since last visit but does feel that he has been more unstable.  Sometimes will yell out at night but seems infrequent.  No hallucinations or near syncope.  I did review records since last visit.  He was seen back at [facility] and ended up having eye surgery for lower eyelid retraction bilaterally on March 1.    11/24/15 update:  The patient is following  up today, accompanied by his wife who supplements the history.  After our last visit, the patient was supposed to be on carbidopa/levodopa 50/200 CR 5 times per day and no more than carbidopa/levodopa 25/100 3 times per day.  He called me quite some time after last visit to state that he had gone up on the carbidopa/levodopa 50/200, to 6 times per day and carbidopa/levodopa 25/100, 5 times per day and subsequently hallucinations had become very prominent.  I explained that unfortunately, this is why I did not want him to go up on the medication, as he was not a Nuplazid candidate because of the fact that he already had QT prolongation.  He went back to taking them as he was supposed to but he is still having some hallucinations.  He recognizes that they aren't real.  No falls since our last visit.  He is exercising some but gets tired quickly.   They asked me about xadago.   On July 13, he did undergo surgical placement of a new pacemaker.  He didn't notice a big difference in stamina with the new PPM but he has been able to breathe better.    12/26/15 update:  Pt returns for f/u, accompanied by his wife who supplements the history.  He is still on carbidopa/levodopa 25/100 3 times a day and carbidopa/levodopa 50/200 CR five times a day.  Pt tells me he is actually taking carbidopa/levodopa 50/200, 2 po 5 times a day but wife doesn't think he is doing it that way.  She isn't sure but has realized she needs to manage meds.  He will sometimes get up in the middle of the night and take an extra carbidopa/levodopa 25/100 and an extra 50/200.  Last visit the patient really wanted to try xadago because he had a friend on it who was doing great.  I was not convinced it would be that helpful and initially he decided to not try it because of this but ultimately did try it.  Pts wife noted confusion in the patient and she initially thought that it was the medication (although she had noted confusion prior to that and it was documented in a phone encounter).  The medication was d/c.  He has begun to have paranoia and has heard noises in the house in addition to visual hallucinations.  Has called his wife a liar when she tells him that no one is there.  Wife states that confusion continues.  He has asked his wife "how long he will live in this house" when they have owned that house for years.  Wife states that hallucinations have become more "complex."  Had UA that was neg (except dehydration).  Urine cx was negative.  Wife also notes more unsteady on feet  03/25/16 update:  Patient returns for follow-up, accompanied by his wife who supplements the history. He is still on carbidopa/levodopa 50/200 CR 5 times a day and carbidopa/levodopa 25/100 IR 3 times a day.  I had planned to see him about a week and a half after her last visit, but unfortunately he fell and  fractured his femur on the left and had surgery on 01/06/2016.  He subsequently went to [facility] for rehabilitation.  He did have post op delirium.  Wife states that "confusion looms."  Didn't quite get back to baseline.  He is back at home.  He is having more hallucinations.  They are now not just visual but auditory as well.  Confuses wifes position in  the home - will say "are you the one who cooks for me?"  Will then say, "who is the person who sleeps in my bed."  Neuroimaging has previously been performed.  It is not available for my review today.  PREVIOUS MEDICATIONS: Sinemet (reported bad dreams/dizziness on regular formulation and same with Mirapex); requip;  on Sinemet CR currently, entacapone (felt dizzy and arms felt "heavy"); klonopin (made arms/legs hurt); artane; amantadine (hallucinations); rytary (costly and pt has no RX coverage); neupro  ALLERGIES:   Allergies  Allergen Reactions  . Plavix [Clopidogrel]     Fatigue   . Pravastatin     Muscle aches  . Zocor [Simvastatin]     REACTION: dizziness  . Penicillins Rash    Has patient had a PCN reaction causing immediate rash, facial/tongue/throat swelling, SOB or lightheadedness with hypotension: YES Has patient had a PCN reaction causing severe rash involving mucus membranes or skin necrosis: NO Has patient had a PCN reaction that required hospitalization NO Has patient had a PCN reaction occurring within the last 10 years: NO If all of the above answers are "NO", then may proceed with Cephalosporin use.    CURRENT MEDICATIONS:  Current Outpatient Prescriptions on File Prior to Visit  Medication Sig Dispense Refill  . aspirin 81 MG tablet Take 81 mg by mouth daily.    . bisoprolol (ZEBETA) 5 MG tablet Take 1 tablet (5 mg total) by mouth daily. 30 tablet 0  . carbidopa-levodopa (SINEMET CR) 50-200 MG tablet Take 1 tablet by mouth 5 (five) times daily. 450 tablet 1  . carbidopa-levodopa (SINEMET IR) 25-100 MG tablet Take 1  tablet by mouth 3 (three) times daily. 270 tablet 1  . furosemide (LASIX) 80 MG tablet Take 1 tablet (80 mg total) by mouth daily. 90 tablet 3  . lansoprazole (PREVACID) 15 MG capsule Take 15 mg by mouth daily as needed (Approx 2 times weekly). gerd    . Polyethyl Glycol-Propyl Glycol (SYSTANE ULTRA OP) Place 1 drop into both eyes daily.    . polyethylene glycol (MIRALAX / GLYCOLAX) packet Take 17 g by mouth daily.    . potassium chloride (K-DUR) 10 MEQ tablet Take 2 tablets (20 mEq total) daily 90 tablet 3  . vitamin B-12 (CYANOCOBALAMIN) 1000 MCG tablet Take 1,000 mcg by mouth daily.     Current Facility-Administered Medications on File Prior to Visit  Medication Dose Route Frequency Provider Last Rate Last Dose  . DOBUTamine (DOBUTREX) 1,000 mcg/mL in dextrose 5% 250 mL infusion  5-20 mcg/kg/min (Order-Specific) Intravenous Continuous Carlena Bjornstad, MD 40.8 mL/hr at 09/20/14 1415 10 mcg/kg/min at 09/20/14 1415    PAST MEDICAL HISTORY:   Past Medical History:  Diagnosis Date  . Allergy   . Aortic valve stenosis, acquired    a. s/p Porcine AVR 2001;  b. 09/2014 [place] Echo: mod-sev bioprosthetic AS w/ minimal change in CO and only mild increase in gradients w/ dobutamine.   Marland Kitchen CAD (coronary artery disease)    a. s/p CABG with LIMA-LAD in 2001 with AVR. b. BMS to LCx 8/08. c. Abnl nuc/dec EF 05/2014 - s/p cath with patent LIMA to LAD, patent dominant right, mild LCx disease, patent stent. Med rx.  . Chronic combined systolic (congestive) and diastolic (congestive) heart failure    a. EF 50-55% in 02/2013;  b. 05/2014 Echo: EF 20-25%, Gr1 DD.  Marland Kitchen Diverticulitis   . ED (erectile dysfunction)   . Essential hypertension   . GERD (gastroesophageal reflux disease)   .  Hyperlipidemia   . MALT lymphoma ([place])   . Nonischemic cardiomyopathy ([place])    a. 05/2014 Echo: EF 20-25%-->f/u cath w/ nonobs dzs.  . Osteoarthritis   . Osteoporosis   . Parkinson's disease   . Renal artery stenosis ([facility])    a.  90% RRA stenosis previously. b. Duplex 09/2014: R renal artery 60-99%, L renal artery 1-59%.  . S/P cardiac pacemaker procedure, Medtronic Adapta L  ADDRL1, 01/21/12    a. 01/2012 s/p MDT [place] PPM (ser # [id number]).  . Symptomatic bradycardia    a. 01/2012 s/p MDT [place] PPM (ser # [id number]).  Marland Kitchen TIA (transient ischemic attack) 06/08   a. 09/2006    PAST SURGICAL HISTORY:   Past Surgical History:  Procedure Laterality Date  . AORTIC VALVE REPLACEMENT  2001   Porcine valve  . CARDIAC CATHETERIZATION  06/20/2014   Procedure: CORONARY/GRAFT ANGIOGRAPHY;  Surgeon: Lorretta Harp, MD;  Location: [facility] CATH LAB;  Service: Cardiovascular;;  . CATARACT EXTRACTION  06/09   left  . CORONARY ARTERY BYPASS GRAFT  2001   ( van trig[place] ) aortic valve replacement 2001; LIMA-LAD.  Marland Kitchen CORONARY STENT PLACEMENT  11/2007   8/09  Left Circumflex Stent by Dr Toy Care started and aggrenox stopped  . EP IMPLANTABLE DEVICE N/A 10/30/2015   Procedure: BiV Pacemaker Upgrade;  Surgeon: Thompson Grayer, MD;  Location: [facility] CV LAB;  Service: Cardiovascular;  Laterality: N/A;  . ESOPHAGOGASTRODUODENOSCOPY     multiple, Colon/ EGD benign 11/2004  . EYE SURGERY  06/18/15  . INGUINAL HERNIA REPAIR     LIH 1997Llap. bilateral hernias 1998  . LAPAROSCOPIC CHOLECYSTECTOMY  07/11   Dr.Rosenbower  . NM MYOCAR PERF WALL MOTION  11/22/2007   mild septal ischemia,EF 66%  . PACEMAKER INSERTION  01/21/2012   Medtronic Adapta L implanted for complete heart block  . PERMANENT PACEMAKER INSERTION N/A 01/21/2012   Procedure: PERMANENT PACEMAKER INSERTION;  Surgeon: Thompson Grayer, MD;  Location: [facility] CATH LAB;  Service: Cardiovascular;  Laterality: N/A;  . PILONIDAL CYST / SINUS EXCISION  1953  . SQUAMOUS CELL CARCINOMA EXCISION  3/13   back  . TOTAL HIP ARTHROPLASTY Left 01/06/2016   Procedure: TOTAL HIP ARTHROPLASTY ANTERIOR APPROACH;  Surgeon: Hessie Knows, MD;  Location: [facility] ORS;  Service: Orthopedics;   Laterality: Left;    SOCIAL HISTORY:   Social History   Social History  . Marital status: Married    Spouse name: N/A  . Number of children: 3  . Years of education: N/A   Occupational History  . retired- [employer]   . part-time sub/courier for schools- now only rarely    Social History Main Topics  . Smoking status: Former Smoker    Packs/day: 1.00    Years: 5.00    Types: Cigarettes    Quit date: 06/06/1963  . Smokeless tobacco: Never Used  . Alcohol use No     Comment: very rare  . Drug use: No  . Sexual activity: No   Other Topics Concern  . Not on file   Social History Narrative   Has living will   Wife, then son [name], hold health care POA   Requests DNR --done   Requests no tube feeds if cognitively unaware    FAMILY HISTORY:   Family Status  Relation Status  . Mother Deceased at age 38   old age  . Father Deceased at age 30   acute bronchitis, cotton exposure  .  Sister Deceased   trauma  . Brother Deceased   2, deceased  . Daughter Deceased at age 25   osteosarcoma  . Child Alive   3, healthy  . Brother Alive   healthy  . Sister Alive   healthy    ROS:    A complete 10 system review of systems was obtained and was unremarkable apart from what is mentioned above.  PHYSICAL EXAMINATION:    VITALS:   Vitals:   03/26/16 1019  BP: 100/68  Pulse: 78  Weight: 143 lb (64.9 kg)  Height: '5\' 6"'  (1.676 m)   Wt Readings from Last 3 Encounters:  03/26/16 143 lb (64.9 kg)  03/15/16 143 lb 9.6 oz (65.1 kg)  02/26/16 143 lb (64.9 kg)     GEN:  The patient appears stated age and is in NAD.   HEENT:  Normocephalic, atraumatic.  The mucous membranes are moist. The superficial temporal arteries are without ropiness or tenderness. CV:  RRR Lungs:  CTAB Neck/HEME:  There are no carotid bruits bilaterally.  Neurological examination:  Orientation: The patient is alert and oriented x2. Montreal Cognitive Assessment  12/26/2015 12/03/2014   Visuospatial/ Executive (0/5) 0 2  Naming (0/3) 3 3  Attention: Read list of digits (0/2) 2 1  Attention: Read list of letters (0/1) 1 0  Attention: Serial 7 subtraction starting at 100 (0/3) 1 2  Language: Repeat phrase (0/2) 2 2  Language : Fluency (0/1) 0 0  Abstraction (0/2) 2 1  Delayed Recall (0/5) 1 2  Orientation (0/6) 4 5  Total 16 18  Adjusted Score (based on education) 17 18    Movement examination: Tone: There is normal tone in the bilateral upper extremities today.  The tone in the lower extremities is normal.  Abnormal movements: There is a moderate resting tremor bilaterally, R>L.  No dyskinesia. Coordination:  There is no decremation, with any form of RAMS, including alternating supination and pronation of the forearm, hand opening and closing, finger taps, heel taps and toe taps. Gait and Station: The patient uses the cane to ambulate and does well with that.  Lab Results  Component Value Date   WBC 9.4 01/09/2016   HGB 11.4 (L) 01/09/2016   HCT 32.2 (L) 01/09/2016   MCV 91.3 01/09/2016   PLT 248 01/09/2016     Chemistry      Component Value Date/Time   NA 139 03/15/2016 1342   K 4.3 03/15/2016 1342   CL 97 (L) 03/15/2016 1342   CO2 32 (H) 03/15/2016 1342   BUN 12 03/15/2016 1342   CREATININE 0.75 03/15/2016 1342      Component Value Date/Time   CALCIUM 9.7 03/15/2016 1342   ALKPHOS 64 12/18/2015 1045   AST 13 12/18/2015 1045   ALT <3 (L) 12/18/2015 1045   BILITOT 0.5 12/18/2015 1045     Lab Results  Component Value Date   VITAMINB12 660 03/06/2013   Lab Results  Component Value Date   TSH 4.349 06/17/2014     ASSESSMENT/PLAN:  1.  Idiopathic Parkinsons disease.    -Long discussion again with the patient and his wife.  Unfortunately, he really is in a position where we cannot go up further on the levodopa because of hallucinations and he is not a candidate for Nuplazid/seroquel because of prolonged QT.  He is not a DBS candidate because of  multiple other medical problems.  He will take carbidopa/levodopa 50/200 CR, 5 times per day and carbidopa/levodopa 25/100  IR, up to 3times per day.   -talked about transitions of care/step up care facilities.  Wife becoming overburdened with caregiving.  Met with our Education officer, museum today 2.  Parkinson disease dementia with hallucinations  -I think this is from long-standing Parkinson's disease.  We have talked about the acetylcholinesterase inhibitors in the past but he was not interested in Exelon because of risk of nausea and had refused Aricept because he didn't want more medication.  Unfortunately, confusion, hallucinations and paranoia have increased.  I would like to see him on nuplazid but his QT interval is already on higher end and it could prolong 5-108m.   Going to ask Dr ARayann Hemanabout this.  If he thinks okay, will start.  If not, then will drop levodopa load.  Will also consider exelon patches or aricept again.  -doesn't drive.  Discussed home safety at length. 3.  B12 deficiency.  -He is on oral supplements  4.  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  -He could not tolerate klonopin.  Is overall mild and talked about home safety 5.  I. will see him back in the next 4 months.  Much greater than 50% of this visit was spent in counseling and coordinating care.  Total face to face time:  35 min

## 2016-03-26 ENCOUNTER — Ambulatory Visit (INDEPENDENT_AMBULATORY_CARE_PROVIDER_SITE_OTHER): Payer: Medicare Other | Admitting: Neurology

## 2016-03-26 ENCOUNTER — Encounter: Payer: Self-pay | Admitting: Neurology

## 2016-03-26 VITALS — BP 100/68 | HR 78 | Ht 66.0 in | Wt 143.0 lb

## 2016-03-26 DIAGNOSIS — R441 Visual hallucinations: Secondary | ICD-10-CM | POA: Diagnosis not present

## 2016-03-26 DIAGNOSIS — F0280 Dementia in other diseases classified elsewhere without behavioral disturbance: Secondary | ICD-10-CM | POA: Diagnosis not present

## 2016-03-26 DIAGNOSIS — G20 Parkinson's disease: Secondary | ICD-10-CM

## 2016-03-26 DIAGNOSIS — I701 Atherosclerosis of renal artery: Secondary | ICD-10-CM

## 2016-03-26 DIAGNOSIS — G20A1 Parkinson's disease without dyskinesia, without mention of fluctuations: Secondary | ICD-10-CM

## 2016-03-26 NOTE — Progress Notes (Signed)
Clinical Social Work Note  CSW received request from Dr. Carles Collet to meet with pt and pt wife during today's follow up visit. CSW met with pt and pt wife in exam room. CSW introduced self and informed of social work services including connection to [employer] or Scientist, clinical (histocompatibility and immunogenetics), short-term coping/problem-solving counseling, and community involvement. CSW provided supportive listening as pt wife discussed that pt has Parkinson's disease with Parkinson's disease dementia. Pt wife discussed that she is primary caregiver for pt and recognizes that care is becoming more difficult to manage and pt and pt wife want to look into options for transitioning into a continued care facility. Pt wife discussed that wherever they move that they would both be going and pt wife would prefer the facility to be a location that has multiple levels of care either Independent Living and Assisted Living or Independent Living, Assisted Living, and Skilled Nursing. Pt wife shared that pt and pt wife had toured [facility] last summer. CSW explored with pt wife areas that pt and pt wife would be interested in as far as location and pt wife discussed that preference would be [facility] or [place]. CSW discussed that CSW has a list of Assisted Living Facilities in [place], but CSW will be happy to research which of those facilities have either [place] and Assisted Living or [place], Assisted Living, and Skilled Nursing and research options in [place] to assist pt wife in narrowing down the options. Pt wife expressed that this would be extremely helpful to her in order for her to have a narrowed list to begin researching cost, etc. Pt wife shared that it would take some time to make transition given that pt and pt wife home would have to be sold. CSW validated this and provided education that it is important to begin the research for the facilities as some facilities may have wait  lists. Pt wife expressed understanding.   CSW to research facilities and follow up with pt wife with list that meets the needs that pt and pt wife are looking for in a continued care facility.   CSW provided pt and pt wife CSW contact information and they have agreed to contact me if in further needs arise prior to next visit.   CSW to continue to follow to provide psychosocial support and connection to resources.  Alison Murray, MSW, LCSW Clinical Social Worker [facility] [phone]

## 2016-03-28 ENCOUNTER — Telehealth: Payer: Self-pay | Admitting: Neurology

## 2016-03-28 NOTE — Telephone Encounter (Signed)
Jade,  Tell patient that I have been in contact with Dr. Rayann Heman and he kindly provided his input.  Said that QT interval was normal and okay to start nuplazid.  We may recheck EKG in future.  Start nuplazid 17mg  - 2 po q day.

## 2016-03-29 ENCOUNTER — Telehealth: Payer: Self-pay | Admitting: Clinical

## 2016-03-29 NOTE — Telephone Encounter (Signed)
E-mail sent to patient's wife with information on [facility] in [place] and [place]. Contacted pt wife via telephone to notify that e-mail had been sent. Pt wife appreciative of information.   Alison Murray, MSW, LCSW Clinical Social Worker [facility] [phone]

## 2016-03-29 NOTE — Telephone Encounter (Signed)
Left message on machine for patient to call back.

## 2016-03-30 ENCOUNTER — Telehealth: Payer: Self-pay | Admitting: Neurology

## 2016-03-30 NOTE — Telephone Encounter (Signed)
Spoke with patient's wife and made them aware I need them to sign forms to start Nuplazid. They will come tomorrow.

## 2016-03-30 NOTE — Telephone Encounter (Signed)
Can recheck EKG a week or so after pt gets started on nuplazid

## 2016-04-01 MED ORDER — PIMAVANSERIN TARTRATE 17 MG PO TABS: 2.0000 | ORAL_TABLET | Freq: Every day | ORAL | 0 refills | Status: DC

## 2016-04-01 NOTE — Addendum Note (Signed)
Addended byAnnamaria Helling on: 04/01/2016 11:31 AM   Modules accepted: Orders

## 2016-04-01 NOTE — Telephone Encounter (Signed)
Patient signed paperwork and was given samples.

## 2016-04-06 ENCOUNTER — Other Ambulatory Visit (HOSPITAL_COMMUNITY): Payer: Medicare Other

## 2016-04-08 ENCOUNTER — Telehealth: Payer: Self-pay | Admitting: Neurology

## 2016-04-08 DIAGNOSIS — I2510 Atherosclerotic heart disease of native coronary artery without angina pectoris: Secondary | ICD-10-CM

## 2016-04-08 DIAGNOSIS — I2583 Coronary atherosclerosis due to lipid rich plaque: Secondary | ICD-10-CM

## 2016-04-08 DIAGNOSIS — Z5181 Encounter for therapeutic drug level monitoring: Secondary | ICD-10-CM

## 2016-04-08 NOTE — Telephone Encounter (Signed)
EKG scheduled with wife at [facility] for tomorrow at 11 am.

## 2016-04-08 NOTE — Telephone Encounter (Signed)
LMOM to check on how patient is doing on Nuplazid. Made him aware that if he is still taking medication he does need to have EKG performed. Order placed for [facility] and patient to call with any questions.

## 2016-04-09 ENCOUNTER — Ambulatory Visit (HOSPITAL_COMMUNITY)
Admission: RE | Admit: 2016-04-09 | Discharge: 2016-04-09 | Disposition: A | Discharge status: Home / Self Care | Payer: Medicare Other | Source: Ambulatory Visit | Attending: Neurology | Admitting: Neurology

## 2016-04-09 DIAGNOSIS — Z950 Presence of cardiac pacemaker: Secondary | ICD-10-CM | POA: Insufficient documentation

## 2016-04-09 DIAGNOSIS — I2510 Atherosclerotic heart disease of native coronary artery without angina pectoris: Secondary | ICD-10-CM | POA: Insufficient documentation

## 2016-04-09 DIAGNOSIS — I2583 Coronary atherosclerosis due to lipid rich plaque: Secondary | ICD-10-CM | POA: Insufficient documentation

## 2016-04-09 DIAGNOSIS — R9431 Abnormal electrocardiogram [ECG] [EKG]: Secondary | ICD-10-CM | POA: Insufficient documentation

## 2016-04-09 DIAGNOSIS — Z5181 Encounter for therapeutic drug level monitoring: Secondary | ICD-10-CM | POA: Insufficient documentation

## 2016-04-14 ENCOUNTER — Telehealth: Payer: Self-pay | Admitting: Neurology

## 2016-04-14 DIAGNOSIS — H35362 Drusen (degenerative) of macula, left eye: Secondary | ICD-10-CM | POA: Diagnosis not present

## 2016-04-14 DIAGNOSIS — H35372 Puckering of macula, left eye: Secondary | ICD-10-CM | POA: Diagnosis not present

## 2016-04-14 DIAGNOSIS — H4423 Degenerative myopia, bilateral: Secondary | ICD-10-CM | POA: Diagnosis not present

## 2016-04-14 DIAGNOSIS — H353131 Nonexudative age-related macular degeneration, bilateral, early dry stage: Secondary | ICD-10-CM | POA: Diagnosis not present

## 2016-04-14 NOTE — Telephone Encounter (Signed)
EKG results in EPIC. QT interval 476/487 compared to EKG in November where QT interval was 438/438. This was what Dr. Carles Collet was looking at due to addition of Nuplazid. Please advise.

## 2016-04-14 NOTE — Telephone Encounter (Signed)
Have you gotten result of the EKG?  They would like a call.

## 2016-04-15 ENCOUNTER — Telehealth: Payer: Self-pay | Admitting: Internal Medicine

## 2016-04-15 ENCOUNTER — Ambulatory Visit (INDEPENDENT_AMBULATORY_CARE_PROVIDER_SITE_OTHER): Payer: Medicare Other

## 2016-04-15 ENCOUNTER — Telehealth: Payer: Self-pay | Admitting: Neurology

## 2016-04-15 DIAGNOSIS — Z950 Presence of cardiac pacemaker: Secondary | ICD-10-CM | POA: Diagnosis not present

## 2016-04-15 DIAGNOSIS — I5022 Chronic systolic (congestive) heart failure: Secondary | ICD-10-CM

## 2016-04-15 DIAGNOSIS — I509 Heart failure, unspecified: Secondary | ICD-10-CM

## 2016-04-15 NOTE — Telephone Encounter (Signed)
EKG now shows prolonged QTc interval, likely side effect of medication.  If there has not been any marked improvement in his hallucinations, the risks of the medication may out weight the benefit.  I would recommend stopping the medication.  Dr. Carles Collet will be back in the office next week, if they have questions.  Donika K. Posey Pronto, DO

## 2016-04-15 NOTE — Telephone Encounter (Signed)
Wife calling explaining Dr. Carles Collet started new Parkinson med on pt 12/14 - Nuplazid 34mg  once daily.  Dr. Carles Collet discussed w/ Dr. Rayann Heman prior to starting -- recommendation for follow up EKG. EKG on 12/22 showing prolonged QT and they (Dr. Carles Collet) recommended pt stop medication.  Wife is asking if Allred has reviewed EKG and if this med should have been stopped.  Informed wife that Dr. Rayann Heman is out for the holiday until 04/22/16. Reviewed EKG w/ Dr. Lovena Le who states ok for pt to remain on medication.  Actual QTC 467.  Recommends reviewing EKG w/ Dr. Rayann Heman when he returns to the office and get another follow up EKG at his discretion. Will give to Dr. Jackalyn Lombard nurse to review with him. Advised pt's wife that ok for pt to remain on medication for now.  She understands Claiborne Billings, RN will call her once reviewed with Dr. Rayann Heman.

## 2016-04-15 NOTE — Telephone Encounter (Signed)
Spoke with patient and his wife and let them know to stop medication.  Dr. Carles Collet - please advise of any other options once you review these notes.

## 2016-04-15 NOTE — Telephone Encounter (Signed)
New Message:     She  needs to give you an update on what is gong with the pt.

## 2016-04-15 NOTE — Telephone Encounter (Signed)
Nuplazid takes 6 weeks to show improvement. Should he still stop due to prolonged QT interval?

## 2016-04-15 NOTE — Telephone Encounter (Signed)
Wife returned call regarding medication and had spoke with someone this morning.  Advised that Trinidad Curet had tried to call her back this morning.  Forward message back to Venida Jarvis advising that wife returned call and wife stated she is available for call back.

## 2016-04-15 NOTE — Telephone Encounter (Signed)
Ian [name] 02-02-2029. His wife [name]  Would like you to

## 2016-04-15 NOTE — Telephone Encounter (Signed)
With his cardiac history, I would recommend stopping the medication.

## 2016-04-15 NOTE — Telephone Encounter (Signed)
I certainly defer to cardiology on this and appreciate the input greatly!  Sounds like a plan to recheck in few weeks

## 2016-04-15 NOTE — Telephone Encounter (Signed)
His wife [name] would like you to please call her. Her # is [phone]. Thank you

## 2016-04-15 NOTE — Telephone Encounter (Signed)
Spoke with patient's wife. She states she called Nuplazid and they recommend she call his cardiologist prior to stopping medication. They spoke with Dr. Lovena Le who told them that there was not enough of a significant difference in the QT interval for patient to stop medication. He suggested for them to continue medication and recheck EKG in three weeks. That is what they are going to do. Dr. Carles Collet please advise.

## 2016-04-15 NOTE — Progress Notes (Signed)
EPIC Encounter for ICM Monitoring  Patient Name: Ian [name] is a 82 y.o. male Date: 04/15/2016 Primary Care Physican: Viviana Simpler, MD Primary Cardiologist:Croitoru Electrophysiologist: Allred Dry Weight:unknown Bi-V Pacing: 99%     Spoke with wife.  Heart Failure questions reviewed, pt asymptomatic   Thoracic impedance normal   Recommendations: No changes.  Reinforced to limit low salt food choices to 2000 mg day and limiting fluid intake to < 2 liters per day. Encouraged to call for fluid symptoms.    Follow-up plan: ICM clinic phone appointment on 05/18/2016.  Copy of ICM check sent to device physician.   3 month ICM trend : 04/15/2016   1 Year ICM trend:      Rosalene Billings, RN 04/15/2016 8:25 AM

## 2016-04-23 NOTE — Telephone Encounter (Signed)
Dr Rayann Heman reviewed and patient's wife aware

## 2016-04-26 ENCOUNTER — Ambulatory Visit (INDEPENDENT_AMBULATORY_CARE_PROVIDER_SITE_OTHER): Payer: Medicare Other

## 2016-04-26 DIAGNOSIS — Z950 Presence of cardiac pacemaker: Secondary | ICD-10-CM

## 2016-04-26 DIAGNOSIS — I5022 Chronic systolic (congestive) heart failure: Secondary | ICD-10-CM

## 2016-04-26 NOTE — Progress Notes (Signed)
EPIC Encounter for ICM Monitoring  Patient Name: Ian [name] is a 83 y.o. male Date: 04/26/2016 Primary Care Physican: Viviana Simpler, MD Primary Cardiologist:Croitoru Electrophysiologist: Allred Dry Weight:unknown Bi-V Pacing: 99%          Wife called to report patient had difficulty breathing while lying down last night and has had thick phlegm that is non productive since yesterday, 04/25/2016.  She stated she thinks he is retaining fluid.  Remote transmission sent and reviewed.  Patient has no fever.  Patient reported he did not think Furosemide has been effective for the last few days because of decrease urine output.   Wife stated patient had EKG that was reviewed by Dr Lovena Le 04/15/2016 because patient is taking new Parkinson's medication that may effect the heart.  Dr Lovena Le advised no significant change in EKG and will repeat in 3 weeks.  Thoracic impedance abnormal suggesting fluid accumulation since 04/23/16.  Labs: 03/15/2016 Creatinine 0.75, BUN 12, Potassium 4.3, Sodium 136 01/09/2016 Creatinine 0.67, BUN 15, Potassium 4.0, Sodium 135, EGFR >60  01/07/2016 Creatinine 0.79, BUN 21, Potassium 4.2, Sodium 139 01/06/2016 Creatinine 0.82, BUN 19, Potassium 4.0, Sodium 139 12/18/2015 Creatinine 0.94, BUN 14, Potassium 4.5, Sodium 142 10/23/2015 Creatinine 0.89, BUN 17, Potassium 4.3, Sodium 139 09/23/2015 Creatinine 0.73, BUN 14, Potassium 5.2, Sodium 138  Recommendations:  Recommended to increase Furosemide to 80 mg 1 tablet in morning and 1/2 tablet 40 mg in the pm x 3 days and after 3rd day return to 80 mg every morning.  Recommended to increase Potassium 10 mEq to 2 tablets in am and 1 tablet in pm x 3 days and then return to normal prescribed dosage of 2 tablets every am.  Advised if patient does not improve or worsen to call back.     Follow-up plan: ICM clinic phone appointment on 04/30/2016 to recheck fluid levels.  Copy of ICM check sent to Dr Sallyanne Kuster and Dr Rayann Heman.    3 month ICM trend : 04/26/2016   1 Year ICM trend:      Rosalene Billings, RN 04/26/2016 9:26 AM

## 2016-04-27 NOTE — Progress Notes (Signed)
Thanks. Hope that works. I will be out next week MCr

## 2016-04-28 DIAGNOSIS — H35372 Puckering of macula, left eye: Secondary | ICD-10-CM | POA: Diagnosis not present

## 2016-04-30 ENCOUNTER — Ambulatory Visit (INDEPENDENT_AMBULATORY_CARE_PROVIDER_SITE_OTHER): Payer: Medicare Other

## 2016-04-30 DIAGNOSIS — Z950 Presence of cardiac pacemaker: Secondary | ICD-10-CM

## 2016-04-30 DIAGNOSIS — I5022 Chronic systolic (congestive) heart failure: Secondary | ICD-10-CM

## 2016-04-30 NOTE — Progress Notes (Signed)
Thanks MCr 

## 2016-04-30 NOTE — Progress Notes (Signed)
EPIC Encounter for ICM Monitoring  Patient Name: Ian [name] is a 83 y.o. male Date: 04/30/2016 Primary Care Physican: Viviana Simpler, MD Primary Cardiologist:Croitoru Electrophysiologist: Allred Dry Weight:135 lbs Bi-V Pacing: 99%        Heart Failure questions reviewed, pt asymptomatic for fluid symptoms but does have some dizziness.  Thoracic impedance returned to normal after Furosemide increase for 3 days.    Labs: 03/15/2016 Creatinine 0.75, BUN 12, Potassium 4.3, Sodium 136 01/09/2016 Creatinine 0.67, BUN 15, Potassium 4.0, Sodium 135, EGFR >60  01/07/2016 Creatinine 0.79, BUN 21, Potassium 4.2, Sodium 139 01/06/2016 Creatinine 0.82, BUN 19, Potassium 4.0, Sodium 139 12/18/2015 Creatinine 0.94, BUN 14, Potassium 4.5, Sodium 142 10/23/2015 Creatinine 0.89, BUN 17, Potassium 4.3, Sodium 139 09/23/2015 Creatinine 0.73, BUN 14, Potassium 5.2, Sodium 138  Recommendations: No changes. Discussed limiting dietary salt intake to 2000 mg/day and fluid intake to < 2 liters per day. Encouraged to call for fluid symptoms.  Follow-up plan: ICM clinic phone appointment on 05/18/2016.  Office appointment with Dr Sallyanne Kuster on 06/15/2016  Copy of ICM check sent to primary cardiologist and device physician.   3 month ICM trend: 04/30/2016   1 Year ICM trend:      Rosalene Billings, RN 04/30/2016 10:44 AM

## 2016-05-10 DIAGNOSIS — Z09 Encounter for follow-up examination after completed treatment for conditions other than malignant neoplasm: Secondary | ICD-10-CM | POA: Diagnosis not present

## 2016-05-10 DIAGNOSIS — H35372 Puckering of macula, left eye: Secondary | ICD-10-CM | POA: Diagnosis not present

## 2016-05-18 ENCOUNTER — Ambulatory Visit (INDEPENDENT_AMBULATORY_CARE_PROVIDER_SITE_OTHER): Payer: Medicare Other

## 2016-05-18 DIAGNOSIS — Z950 Presence of cardiac pacemaker: Secondary | ICD-10-CM | POA: Diagnosis not present

## 2016-05-18 DIAGNOSIS — I5022 Chronic systolic (congestive) heart failure: Secondary | ICD-10-CM | POA: Diagnosis not present

## 2016-05-18 NOTE — Progress Notes (Signed)
Excellent, thank you!

## 2016-05-18 NOTE — Progress Notes (Signed)
EPIC Encounter for ICM Monitoring  Patient Name: Ian [name] is a 83 y.o. male Date: 05/18/2016 Primary Care Physican: Richard Letvak, MD Primary Cardiologist:Croitoru Electrophysiologist: Allred Dry Weight:135 lbs Bi-V Pacing: 99%       Spoke with wife and patient. Heart Failure questions reviewed, pt denied fluid symptoms but feels like he continues to have a little shortness of breath but no worse.   Thoracic impedance normal.  Labs: 11/27/2017Creatinine 0.75, BUN 12, Potassium 4.3, Sodium 136 01/09/2016 Creatinine 0.67, BUN 15, Potassium 4.0, Sodium 135, EGFR >60  01/07/2016 Creatinine 0.79, BUN 21, Potassium 4.2, Sodium 139 01/06/2016 Creatinine 0.82, BUN 19, Potassium 4.0, Sodium 139 12/18/2015 Creatinine 0.94, BUN 14, Potassium 4.5, Sodium 142 10/23/2015 Creatinine 0.89, BUN 17, Potassium 4.3, Sodium 139 09/23/2015 Creatinine 0.73, BUN 14, Potassium 5.2, Sodium 138  Recommendations: No changes.  Encouraged to call for fluid symptoms.  Follow-up plan: ICM clinic phone appointment on 07/16/2016.  Office appointment with Dr Croitoru on 06/15/2016.    Copy of ICM check sent to primary cardiologist and device physician.   3 month ICM trend: 05/18/2016   1 Year ICM trend:      Laurie S Short, RN 05/18/2016 4:00 PM    

## 2016-05-21 ENCOUNTER — Ambulatory Visit (INDEPENDENT_AMBULATORY_CARE_PROVIDER_SITE_OTHER): Payer: Medicare Other

## 2016-05-21 ENCOUNTER — Ambulatory Visit (INDEPENDENT_AMBULATORY_CARE_PROVIDER_SITE_OTHER): Payer: Medicare Other | Admitting: Podiatry

## 2016-05-21 VITALS — Resp 16 | Ht 66.0 in | Wt 138.0 lb

## 2016-05-21 DIAGNOSIS — M722 Plantar fascial fibromatosis: Secondary | ICD-10-CM

## 2016-05-21 MED ORDER — TRIAMCINOLONE ACETONIDE 10 MG/ML IJ SUSP
10.0000 mg | Freq: Once | INTRAMUSCULAR | Status: AC
  Administered 2016-05-21: 10 mg

## 2016-05-22 NOTE — Progress Notes (Signed)
Subjective:     Patient ID: Ian [name], male   DOB: 10-11-1928, 83 y.o.   MRN: [id number]  HPI patient presents with exquisite heel pain bilateral and also has a history of vascular disease. States it's been bad for the last month or 2 and is worse at night   Review of Systems  All other systems reviewed and are negative.      Objective:   Physical Exam  Constitutional: He is oriented to person, place, and time.  Cardiovascular: Intact distal pulses.   Musculoskeletal: Normal range of motion.  Neurological: He is oriented to person, place, and time.  Skin: Skin is warm.  Nursing note and vitals reviewed.  I did noted vascular status to be intact but diminished bilateral with patient found to have neurological within limits and diminished range of motion subtalar midtarsal joint with muscle strength adequate. He's found to have discomfort in the plantar aspect of the heel region bilateral with inflammation fluid around the medial and central band. He does have good digital perfusion and is well oriented and presents with caregiver     Assessment:     Inflammatory fasciitis bilateral versus possibility of vascular orientation    Plan:     H&P x-rays reviewed condition discussed with family. At this point I did do plantar injection bilateral 3 Milligan Kenalog 5 mg Xylocaine advised on physical therapy supportive shoe gear usage and reappoint for [place] to recheck again in the next 2 weeks X-ray report indicates minimal spur formation no indication stress fracture advanced arthritis

## 2016-05-24 ENCOUNTER — Other Ambulatory Visit: Payer: Self-pay | Admitting: Neurology

## 2016-06-02 DIAGNOSIS — H35372 Puckering of macula, left eye: Secondary | ICD-10-CM | POA: Diagnosis not present

## 2016-06-02 DIAGNOSIS — Z09 Encounter for follow-up examination after completed treatment for conditions other than malignant neoplasm: Secondary | ICD-10-CM | POA: Diagnosis not present

## 2016-06-11 ENCOUNTER — Ambulatory Visit (HOSPITAL_COMMUNITY): Payer: Medicare Other | Attending: Cardiology

## 2016-06-11 ENCOUNTER — Ambulatory Visit: Payer: Medicare Other | Admitting: Podiatry

## 2016-06-11 ENCOUNTER — Other Ambulatory Visit: Payer: Self-pay

## 2016-06-11 DIAGNOSIS — I080 Rheumatic disorders of both mitral and aortic valves: Secondary | ICD-10-CM | POA: Diagnosis not present

## 2016-06-11 DIAGNOSIS — I270 Primary pulmonary hypertension: Secondary | ICD-10-CM | POA: Diagnosis not present

## 2016-06-11 DIAGNOSIS — I350 Nonrheumatic aortic (valve) stenosis: Secondary | ICD-10-CM

## 2016-06-11 DIAGNOSIS — I253 Aneurysm of heart: Secondary | ICD-10-CM | POA: Diagnosis not present

## 2016-06-11 LAB — ECHOCARDIOGRAM COMPLETE
AO mean calculated velocity dopler: 210 cm/s
AOVTI: 71.2 cm
AV Area VTI index: 0.45 cm2/m2
AV Area mean vel: 0.79 cm2
AV Peak grad: 36 mmHg
AV peak Index: 0.41
AV pk vel: 299 cm/s
AV vel: 0.77
AVAREAMEANVIN: 0.46 cm2/m2
AVAREAVTI: 0.7 cm2
AVCELMEANRAT: 0.23
AVG: 21 mmHg
Ao pk vel: 0.2 m/s
Ao-asc: 46 cm
CHL CUP RV SYS PRESS: 32 mmHg
CHL CUP STROKE VOLUME: 33 mL
CHL CUP TV REG PEAK VELOCITY: 271 cm/s
E decel time: 567 msec
E/e' ratio: 5.74
FS: 24 % — AB (ref 28–44)
IV/PV OW: 1.05
LA diam end sys: 43 mm
LA vol A4C: 57 ml
LA vol: 63 mL
LADIAMINDEX: 2.51 cm/m2
LASIZE: 43 mm
LAVOLIN: 36.8 mL/m2
LV E/e' medial: 5.74
LV PW d: 15.5 mm — AB (ref 0.6–1.1)
LV SIMPSON'S DISK: 45
LV TDI E'LATERAL: 8.09
LV TDI E'MEDIAL: 4.48
LV e' LATERAL: 8.09 cm/s
LVDIAVOL: 74 mL (ref 62–150)
LVDIAVOLIN: 43 mL/m2
LVEEAVG: 5.74
LVOT VTI: 15.8 cm
LVOT area: 3.46 cm2
LVOT peak grad rest: 1 mmHg
LVOTD: 21 mm
LVOTPV: 60.9 cm/s
LVOTSV: 55 mL
LVOTVTI: 0.22 cm
LVSYSVOL: 41 mL (ref 21–61)
LVSYSVOLIN: 24 mL/m2
MV Dec: 567
MV pk E vel: 46.4 m/s
MVPKAVEL: 75 m/s
P 1/2 time: 367 ms
TR max vel: 271 cm/s
Valve area index: 0.45
Valve area: 0.77 cm2

## 2016-06-15 ENCOUNTER — Ambulatory Visit: Payer: Medicare Other | Admitting: Cardiovascular Disease

## 2016-06-18 ENCOUNTER — Encounter: Payer: Self-pay | Admitting: Podiatry

## 2016-06-18 ENCOUNTER — Ambulatory Visit: Payer: Medicare Other | Admitting: Podiatry

## 2016-06-18 ENCOUNTER — Ambulatory Visit (INDEPENDENT_AMBULATORY_CARE_PROVIDER_SITE_OTHER): Payer: Medicare Other | Admitting: Podiatry

## 2016-06-18 DIAGNOSIS — M722 Plantar fascial fibromatosis: Secondary | ICD-10-CM

## 2016-06-18 MED ORDER — TRIAMCINOLONE ACETONIDE 10 MG/ML IJ SUSP
10.0000 mg | Freq: Once | INTRAMUSCULAR | Status: AC
  Administered 2016-06-18: 10 mg

## 2016-06-20 NOTE — Progress Notes (Signed)
Subjective:     Patient ID: Ian [name], male   DOB: 11/29/1928, 83 y.o.   MRN: [id number]  HPI patient states that he's feeling quite a bit better but is still having discomfort in the heel at nighttime on occasion   Review of Systems     Objective:   Physical Exam Neurovascular status intact with no indications of advancement of symptoms and circulatory status which is adequate currently    Assessment:     Plantar fasciitis present    Plan:     Advised on soft insoles and dispensed over-the-counter cushioned insole and did reinject the fascia one more time 3 mg dexamethasone Kenalog 5 mg Xylocaine and advised on reduced activity

## 2016-06-24 ENCOUNTER — Other Ambulatory Visit: Payer: Self-pay | Admitting: Gastroenterology

## 2016-06-24 ENCOUNTER — Ambulatory Visit
Admission: RE | Admit: 2016-06-24 | Discharge: 2016-06-24 | Disposition: A | Discharge status: Home / Self Care | Payer: Medicare Other | Source: Ambulatory Visit | Attending: Gastroenterology | Admitting: Gastroenterology

## 2016-06-24 DIAGNOSIS — R159 Full incontinence of feces: Secondary | ICD-10-CM

## 2016-06-24 DIAGNOSIS — K5900 Constipation, unspecified: Secondary | ICD-10-CM | POA: Diagnosis not present

## 2016-06-24 DIAGNOSIS — K5901 Slow transit constipation: Secondary | ICD-10-CM

## 2016-06-30 ENCOUNTER — Telehealth: Payer: Self-pay | Admitting: Internal Medicine

## 2016-06-30 NOTE — Telephone Encounter (Signed)
Noted  

## 2016-06-30 NOTE — Telephone Encounter (Signed)
[facility] Patient Name: Ian [name] DOB: [birth year] Initial Comment Caller states husband has developed a terrible cough that goes into his chest. Has a lot of phlegm that has become an issue. Nurse Assessment Nurse: Angeline Slim, RN, [place] Date/Time (Eastern Time): 06/30/2016 12:43:23 PM Confirm and document reason for call. If symptomatic, describe symptoms. ---Caller states pt is coughing and is coughing up alot of phlem, started couple of days ago. continues to hang on and interrupts his sleep Does the patient have any new or worsening symptoms? ---Yes Will a triage be completed? ---Yes Related visit to physician within the last 2 weeks? ---Yes Does the PT have any chronic conditions? (i.e. diabetes, asthma, etc.) ---Yes List chronic conditions. ---parkinsons, heart condition Is this a behavioral health or substance abuse call? ---No Guidelines Guideline Title Affirmed Question Affirmed Notes Cough - Acute Productive SEVERE coughing spells (e.g., whooping sound after coughing, vomiting after coughing) Final Disposition User See Physician within 24 Hours Zellers, RN, [place] Referrals REFERRED TO PCP OFFICE REFERRED TO PCP OFFICE Disagree/Comply: Comply

## 2016-06-30 NOTE — Telephone Encounter (Signed)
Pt has appt with Allie Bossier NP 07/01/16 at 8:45.

## 2016-07-01 ENCOUNTER — Encounter: Payer: Self-pay | Admitting: Primary Care

## 2016-07-01 ENCOUNTER — Ambulatory Visit (INDEPENDENT_AMBULATORY_CARE_PROVIDER_SITE_OTHER): Payer: Medicare Other | Admitting: Primary Care

## 2016-07-01 VITALS — BP 140/78 | HR 66 | Temp 97.7°F | Ht 66.0 in | Wt 140.4 lb

## 2016-07-01 DIAGNOSIS — J209 Acute bronchitis, unspecified: Secondary | ICD-10-CM

## 2016-07-01 MED ORDER — ALBUTEROL SULFATE HFA 108 (90 BASE) MCG/ACT IN AERS: 2.0000 | INHALATION_SPRAY | Freq: Four times a day (QID) | RESPIRATORY_TRACT | 0 refills | Status: DC | PRN

## 2016-07-01 MED ORDER — DOXYCYCLINE HYCLATE 100 MG PO TABS: 100.0000 mg | ORAL_TABLET | Freq: Two times a day (BID) | ORAL | 0 refills | Status: DC

## 2016-07-01 NOTE — Progress Notes (Signed)
Subjective:    Patient ID: Ian [name], male    DOB: October 08, 1928, 83 y.o.   MRN: [id number]  HPI  Ian [name] is an 83 year old male with a history of CHF, HTN, GERD, Allergic rhinitis, parkinson's disease who presents today with a chief complaint of cough. He also reports shortness of breath, wheezing, chest congestion, fatigue. His symptoms began 6 days ago. His cough is productive with yellow/green sputum. He started taking honey and lemon juice with temporary improvement. He denies fevers, sick contacts. His wife is concerned as he's more sluggish and the cough is much worse.  Review of Systems  Constitutional: Positive for fatigue. Negative for chills and fever.  HENT: Positive for congestion and postnasal drip. Negative for sinus pressure and sore throat.   Respiratory: Positive for cough and shortness of breath.   Cardiovascular: Negative for chest pain.       Past Medical History:  Diagnosis Date  . Allergy   . Aortic valve stenosis, acquired    a. s/p Porcine AVR 2001;  b. 09/2014 Dobut Echo: mod-sev bioprosthetic AS w/ minimal change in CO and only mild increase in gradients w/ dobutamine.   Marland Kitchen CAD (coronary artery disease)    a. s/p CABG with LIMA-LAD in 2001 with AVR. b. BMS to LCx 8/08. c. Abnl nuc/dec EF 05/2014 - s/p cath with patent LIMA to LAD, patent dominant right, mild LCx disease, patent stent. Med rx.  . Chronic combined systolic (congestive) and diastolic (congestive) heart failure    a. EF 50-55% in 02/2013;  b. 05/2014 Echo: EF 20-25%, Gr1 DD.  Marland Kitchen Diverticulitis   . ED (erectile dysfunction)   . Essential hypertension   . GERD (gastroesophageal reflux disease)   . Hyperlipidemia   . MALT lymphoma ([place])   . Nonischemic cardiomyopathy ([place])    a. 05/2014 Echo: EF 20-25%-->f/u cath w/ nonobs dzs.  . Osteoarthritis   . Osteoporosis   . Parkinson's disease   . Renal artery stenosis ([facility])    a. 90% RRA stenosis previously. b. Duplex 09/2014: R renal artery  60-99%, L renal artery 1-59%.  . S/P cardiac pacemaker procedure, Medtronic Adapta L  ADDRL1, 01/21/12    a. 01/2012 s/p MDT [place] PPM (ser # [id number]).  . Symptomatic bradycardia    a. 01/2012 s/p MDT [place] PPM (ser # [id number]).  Marland Kitchen TIA (transient ischemic attack) 06/08   a. 09/2006     Social History   Social History  . Marital status: Married    Spouse name: N/A  . Number of children: 3  . Years of education: N/A   Occupational History  . retired- [place] [employer]   . part-time sub/courier for schools- now only rarely    Social History Main Topics  . Smoking status: Former Smoker    Packs/day: 1.00    Years: 5.00    Types: Cigarettes    Quit date: 06/06/1963  . Smokeless tobacco: Never Used  . Alcohol use No     Comment: very rare  . Drug use: No  . Sexual activity: No   Other Topics Concern  . Not on file   Social History Narrative   Has living will   Wife, then son [name], hold health care POA   Requests DNR --done   Requests no tube feeds if cognitively unaware    Past Surgical History:  Procedure Laterality Date  . AORTIC VALVE REPLACEMENT  2001   Porcine valve  .  CARDIAC CATHETERIZATION  06/20/2014   Procedure: CORONARY/GRAFT ANGIOGRAPHY;  Surgeon: Lorretta Harp, MD;  Location: [facility] CATH LAB;  Service: Cardiovascular;;  . CATARACT EXTRACTION  06/09   left  . CORONARY ARTERY BYPASS GRAFT  2001   ( van trigt ) aortic valve replacement 2001; LIMA-LAD.  Marland Kitchen CORONARY STENT PLACEMENT  11/2007   8/09  Left Circumflex Stent by Dr Toy Care started and aggrenox stopped  . EP IMPLANTABLE DEVICE N/A 10/30/2015   Procedure: BiV Pacemaker Upgrade;  Surgeon: Thompson Grayer, MD;  Location: [facility] CV LAB;  Service: Cardiovascular;  Laterality: N/A;  . ESOPHAGOGASTRODUODENOSCOPY     multiple, Colon/ EGD benign 11/2004  . EYE SURGERY  06/18/15  . INGUINAL HERNIA REPAIR     LIH 1997Llap. bilateral hernias 1998  . LAPAROSCOPIC  CHOLECYSTECTOMY  07/11   Dr.Rosenbower  . NM MYOCAR PERF WALL MOTION  11/22/2007   mild septal ischemia,EF 66%  . PACEMAKER INSERTION  01/21/2012   Medtronic Adapta L implanted for complete heart block  . PERMANENT PACEMAKER INSERTION N/A 01/21/2012   Procedure: PERMANENT PACEMAKER INSERTION;  Surgeon: Thompson Grayer, MD;  Location: [facility] CATH LAB;  Service: Cardiovascular;  Laterality: N/A;  . PILONIDAL CYST / SINUS EXCISION  1953  . SQUAMOUS CELL CARCINOMA EXCISION  3/13   back  . TOTAL HIP ARTHROPLASTY Left 01/06/2016   Procedure: TOTAL HIP ARTHROPLASTY ANTERIOR APPROACH;  Surgeon: Hessie Knows, MD;  Location: [facility] ORS;  Service: Orthopedics;  Laterality: Left;    Family History  Problem Relation Age of Onset  . Asthma Father   . Heart disease Brother     Allergies  Allergen Reactions  . Plavix [Clopidogrel]     Fatigue   . Pravastatin     Muscle aches  . Zocor [Simvastatin]     REACTION: dizziness  . Penicillins Rash    Has patient had a PCN reaction causing immediate rash, facial/tongue/throat swelling, SOB or lightheadedness with hypotension: YES Has patient had a PCN reaction causing severe rash involving mucus membranes or skin necrosis: NO Has patient had a PCN reaction that required hospitalization NO Has patient had a PCN reaction occurring within the last 10 years: NO If all of the above answers are "NO", then may proceed with Cephalosporin use.    Current Outpatient Prescriptions on File Prior to Visit  Medication Sig Dispense Refill  . aspirin 81 MG tablet Take 81 mg by mouth daily.    . bisoprolol (ZEBETA) 5 MG tablet Take 1 tablet (5 mg total) by mouth daily. 30 tablet 0  . carbidopa-levodopa (SINEMET CR) 50-200 MG tablet TAKE 1 TABLET 5 TIMES DAILY 450 tablet 1  . carbidopa-levodopa (SINEMET IR) 25-100 MG tablet Take 1 tablet by mouth 3 (three) times daily. 270 tablet 1  . furosemide (LASIX) 80 MG tablet Take 1 tablet (80 mg total) by mouth daily. 90 tablet 3  .  lansoprazole (PREVACID) 15 MG capsule Take 15 mg by mouth daily as needed (Approx 2 times weekly). gerd    . Pimavanserin Tartrate (NUPLAZID) 17 MG TABS Take 2 tablets by mouth daily. 28 tablet 0  . Polyethyl Glycol-Propyl Glycol (SYSTANE ULTRA OP) Place 1 drop into both eyes daily.    . polyethylene glycol (MIRALAX / GLYCOLAX) packet Take 17 g by mouth daily.    . potassium chloride (K-DUR) 10 MEQ tablet Take 2 tablets (20 mEq total) daily 90 tablet 3  . vitamin B-12 (CYANOCOBALAMIN) 1000 MCG tablet Take 1,000 mcg by mouth daily.  Current Facility-Administered Medications on File Prior to Visit  Medication Dose Route Frequency Provider Last Rate Last Dose  . DOBUTamine (DOBUTREX) 1,000 mcg/mL in dextrose 5% 250 mL infusion  5-20 mcg/kg/min (Order-Specific) Intravenous Continuous Carlena Bjornstad, MD 40.8 mL/hr at 09/20/14 1415 10 mcg/kg/min at 09/20/14 1415    BP 140/78   Pulse 66   Temp 97.7 F (36.5 C) (Oral)   Ht 5\' 6"  (1.676 m)   Wt 140 lb 6.4 oz (63.7 kg)   SpO2 94%   BMI 22.66 kg/m    Objective:   Physical Exam  Constitutional: He appears well-nourished. He does not have a sickly appearance. He appears ill.  HENT:  Right Ear: Tympanic membrane and ear canal normal.  Left Ear: Tympanic membrane and ear canal normal.  Nose: No mucosal edema. Right sinus exhibits no maxillary sinus tenderness and no frontal sinus tenderness. Left sinus exhibits no maxillary sinus tenderness and no frontal sinus tenderness.  Mouth/Throat: Oropharynx is clear and moist.  Eyes: Conjunctivae are normal.  Neck: Neck supple.  Cardiovascular: Normal rate and regular rhythm.   Pulmonary/Chest: Effort normal. He has wheezes in the right upper field, the right lower field, the left upper field and the left lower field. He has rhonchi in the right upper field, the right lower field, the left upper field and the left lower field. He has no rales.  Moderate Rhonchi throughout, mild wheezing.  Skin: Skin  is warm and dry.          Assessment & Plan:  URI vs CAP:  Cough, congestion, fatigue x 6 days. Overall feeling worse, cough most concerning to family. Exam today very questionable for bronchitis vs pneumonia. Given no history of COPD his lungs sounds are very abnormal. He is stable for outpatient treatment. No distress, vitals stable. Rx for Doxycycline course sent to pharmacy. Discussed use of Mucinex DM. Fluids, rest, follow up PRN.  Sheral Flow, NP

## 2016-07-01 NOTE — Progress Notes (Signed)
Pre visit review using our clinic review tool, if applicable. No additional management support is needed unless otherwise documented below in the visit note. 

## 2016-07-01 NOTE — Patient Instructions (Signed)
Start Doxycycline antibiotic. Take 1 tablet by mouth twice daily for 10 days.  Cough/Wheezing/Shortness of Breath: Use the Albuterol Inhaler: Inhale 2 puffs into the lungs every 6 to 8 hours as needed for wheezing and/or shortness of breath.   Cough/Congestion: Try taking Mucinex DM. This will help loosen up the mucous in your chest. Ensure you take this medication with a full glass of water.  It was a pleasure meeting you!

## 2016-07-13 ENCOUNTER — Telehealth: Payer: Self-pay

## 2016-07-13 NOTE — Telephone Encounter (Signed)
Attempted 2nd call to both home and cell number and left detailed message on cell phone regarding Dr Croitoru's recommendation.   Advised I would be out of the office tomorrow and if any questions she can call Dr Croitoru's office.

## 2016-07-13 NOTE — Telephone Encounter (Signed)
Please ask him to cut the bisoprolol in half until his office appt. Thanks.

## 2016-07-13 NOTE — Telephone Encounter (Signed)
Returned call to wife after receiving voice mail.  She is concerned about patient's BP being too low.  She has taken the BP numerous times today.  She recorded the following: 122/45, 113/39, 119/42, 106/48, 107/50, 127/48, 110/51, 129/49. He experienced some dizziness this morning but did not last long.  She said he has no energy.   She is concerned BP is too low and should she be worried about the bottom numbers being so low.    Advised I would send her concerns to Dr Sallyanne Kuster.  Already has a scheduled appointment next week 07/23/2016.

## 2016-07-13 NOTE — Telephone Encounter (Signed)
Attempted call back to wife and left detailed message of recommendation and requested a call back.

## 2016-07-14 ENCOUNTER — Ambulatory Visit (INDEPENDENT_AMBULATORY_CARE_PROVIDER_SITE_OTHER): Payer: Medicare Other | Admitting: Podiatry

## 2016-07-14 ENCOUNTER — Encounter: Payer: Self-pay | Admitting: Podiatry

## 2016-07-14 DIAGNOSIS — M722 Plantar fascial fibromatosis: Secondary | ICD-10-CM

## 2016-07-15 NOTE — Progress Notes (Signed)
Subjective:     Patient ID: Ian [name], male   DOB: June 19, 1928, 83 y.o.   MRN: [id number]  HPI patient presents stating the pain has receded to a degree but at nighttime he still notices some burning. Approximate 60% improvement   Review of Systems     Objective:   Physical Exam Neurovascular status intact with patient noted to have mild discomfort plantar heel with significant diminishment of the plantar fat pad    Assessment:     Inflammatory process with possibility of skin or bone irritation secondary to completed fat pad    Plan:     At this point I have recommended cushioned as much as possible not going barefoot and stretching and if symptoms were to persist we will consider custom orthotic devices spent a great deal time educating him on condition

## 2016-07-16 ENCOUNTER — Ambulatory Visit (INDEPENDENT_AMBULATORY_CARE_PROVIDER_SITE_OTHER): Payer: Medicare Other

## 2016-07-16 DIAGNOSIS — I5022 Chronic systolic (congestive) heart failure: Secondary | ICD-10-CM | POA: Diagnosis not present

## 2016-07-16 DIAGNOSIS — Z950 Presence of cardiac pacemaker: Secondary | ICD-10-CM | POA: Diagnosis not present

## 2016-07-16 NOTE — Progress Notes (Signed)
Thanks MCr 

## 2016-07-16 NOTE — Progress Notes (Signed)
EPIC Encounter for ICM Monitoring  Patient Name: Ian [name] is a 83 y.o. male Date: 07/16/2016 Primary Care Physican: Richard Letvak, MD Primary Cardiologist:Croitoru Electrophysiologist: Allred Dry Weight:135 lbs Bi-V Pacing: 99%      Heart Failure questions reviewed, pt asymptomatic.  She confirmed she received the message regarding Dr Croitoru's recommendation to cut the bisoprolol in half until his office appt   Thoracic impedance normal but was abnormal suggesting fluid accumulation from 07/05/2016 to 07/11/2016.  Prescribed and confirmed dosage: Furosemide 80 mg 1 tablet by mouth daily.  Potassium 10 mEq 2 tablets by mouth daily.    Labs: 11/27/2017Creatinine 0.75, BUN 12, Potassium 4.3, Sodium 136 01/09/2016 Creatinine 0.67, BUN 15, Potassium 4.0, Sodium 135, EGFR >60  01/07/2016 Creatinine 0.79, BUN 21, Potassium 4.2, Sodium 139 01/06/2016 Creatinine 0.82, BUN 19, Potassium 4.0, Sodium 139 12/18/2015 Creatinine 0.94, BUN 14, Potassium 4.5, Sodium 142 10/23/2015 Creatinine 0.89, BUN 17, Potassium 4.3, Sodium 139 09/23/2015 Creatinine 0.73, BUN 14, Potassium 5.2, Sodium 138  Recommendations: No changes. Reminded to limit dietary salt intake to 2000 mg/day and fluid intake to < 2 liters/day. Encouraged to call for fluid symptoms.  Follow-up plan: ICM clinic phone appointment on 08/16/2016.  Office appointment with Dr Croitoru 07/23/2016  Copy of ICM check sent to primary cardiologist and device physician.   3 month ICM trend: 07/16/2016   1 Year ICM trend:      Laurie S Short, RN 07/16/2016 9:49 AM    

## 2016-07-19 ENCOUNTER — Telehealth: Payer: Self-pay | Admitting: *Deleted

## 2016-07-19 NOTE — Telephone Encounter (Signed)
No fever. She is just concerned with the deep chest congestion. He sees cardiology on Friday. Will wait for that appt unless he has a drastic change before then.

## 2016-07-19 NOTE — Telephone Encounter (Signed)
If he is not having fever or SOB---we can probably hold off on another appt. Sometimes it can take a while to get over an infection. If he is not doing well, he should be seen again though

## 2016-07-19 NOTE — Telephone Encounter (Signed)
Spoke to pts wife who states that pt was recently seen and given abx. The cough has subsided but pt is still experiencing "a lot of congestion." wife is wanting to know if he is needing something OTC, another round of abx, or if he is needing to be seen again. pls advise

## 2016-07-23 ENCOUNTER — Encounter: Payer: Self-pay | Admitting: Cardiovascular Disease

## 2016-07-23 ENCOUNTER — Ambulatory Visit (INDEPENDENT_AMBULATORY_CARE_PROVIDER_SITE_OTHER): Payer: Medicare Other | Admitting: Cardiovascular Disease

## 2016-07-23 VITALS — BP 110/60 | HR 60 | Ht 66.0 in | Wt 144.6 lb

## 2016-07-23 DIAGNOSIS — T82857A Stenosis of cardiac prosthetic devices, implants and grafts, initial encounter: Secondary | ICD-10-CM

## 2016-07-23 DIAGNOSIS — I442 Atrioventricular block, complete: Secondary | ICD-10-CM

## 2016-07-23 DIAGNOSIS — I10 Essential (primary) hypertension: Secondary | ICD-10-CM

## 2016-07-23 DIAGNOSIS — I2510 Atherosclerotic heart disease of native coronary artery without angina pectoris: Secondary | ICD-10-CM | POA: Diagnosis not present

## 2016-07-23 DIAGNOSIS — I519 Heart disease, unspecified: Secondary | ICD-10-CM | POA: Diagnosis not present

## 2016-07-23 DIAGNOSIS — R001 Bradycardia, unspecified: Secondary | ICD-10-CM | POA: Diagnosis not present

## 2016-07-23 DIAGNOSIS — Z950 Presence of cardiac pacemaker: Secondary | ICD-10-CM

## 2016-07-23 DIAGNOSIS — I255 Ischemic cardiomyopathy: Secondary | ICD-10-CM | POA: Diagnosis not present

## 2016-07-23 DIAGNOSIS — I5022 Chronic systolic (congestive) heart failure: Secondary | ICD-10-CM | POA: Diagnosis not present

## 2016-07-23 DIAGNOSIS — I701 Atherosclerosis of renal artery: Secondary | ICD-10-CM

## 2016-07-23 DIAGNOSIS — I441 Atrioventricular block, second degree: Secondary | ICD-10-CM | POA: Diagnosis not present

## 2016-07-23 DIAGNOSIS — I350 Nonrheumatic aortic (valve) stenosis: Secondary | ICD-10-CM | POA: Diagnosis not present

## 2016-07-23 MED ORDER — POTASSIUM CHLORIDE ER 10 MEQ PO TBCR: 30.0000 meq | EXTENDED_RELEASE_TABLET | Freq: Every day | ORAL | 3 refills | Status: DC

## 2016-07-23 MED ORDER — FUROSEMIDE 80 MG PO TABS: 120.0000 mg | ORAL_TABLET | Freq: Every day | ORAL | 3 refills | Status: DC

## 2016-07-23 NOTE — Patient Instructions (Signed)
Dr Sallyanne Kuster has recommended making the following medication changes: 1. INCREASE Furosemide to 120 mg - take 1.5 tablet daily 2. INCREASE Potassium to 30 mg - take 3 tablets daily  Remote monitoring is used to monitor your Pacemaker of ICD from home. This monitoring reduces the number of office visits required to check your device to one time per year. It allows [place] to keep an eye on the functioning of your device to ensure it is working properly. You are scheduled for a device check from home on Friday, July 6th, 2018. You may send your transmission at any time that day. If you have a wireless device, the transmission will be sent automatically. After your physician reviews your transmission, you will receive a postcard with your next transmission date.  Dr Sallyanne Kuster recommends that you schedule a follow-up appointment in 12 months with a pacemaker check. You will receive a reminder letter in the mail two months in advance. If you don't receive a letter, please call our office to schedule the follow-up appointment.  If you need a refill on your cardiac medications before your next appointment, please call your pharmacy.

## 2016-07-23 NOTE — Progress Notes (Signed)
Patient ID: Ian [name], male   DOB: [birth year], 83 y.o.   MRN: [id number]    Cardiology Office Note    Date:  07/23/2016   ID:  Ian [name], DOB 23-Sep-1928, MRN [id number]  PCP:  Ian Simpler, MD  Cardiologist:  Ian Burow, MD; Ian Klein, MD   Chief Complaint  Patient presents with  . Follow-up    History of Present Illness:  Ian [name] is a 83 y.o. male with complete heart block/pacemaker dependent, here for pacemaker check.  He underwent upgrade to a CRT device last fall. Prior to upgrade left ventricular ejection fraction had dropped to 25-30%, but on the follow-up echocardiogram performed in February left ventricular systolic function has normalized. EF was estimated to be 60-65% gradients across the aortic valve prosthesis remain only moderately elevated.  Despite this remarkable improvement in LV function, functional status is not much better. He seems to still describe 3 pillow orthopnea. He gets up several times a night to use the bathroom. His wife does note that after a long day visiting relatives, when he was exhausted, he was able to sleep through the night without sitting up due to orthopnea. He has occasional problems with orthostatic dizziness. He is on a new Parkinson's syndrome medicines designed to help with hallucinations and delusions, but it hasn't helped at all.  Ian [name] has extensive cardiac problems including a dual-chamber permanent pacemaker for severe bradycardia secondary to 2:1 AV block, 15 years status post aortic valve replacement with a biological prosthesis and coronary disease(single-vessel LIMA to LAD in 2001, stents to the circumflex coronary artery in 2009). 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  EF dropped substantially from 2014-2016, but  angiography showed no evidence of new coronary lesions and the LIMA to LAD was widely patent.  CRT upgrade was performed in November 2017.  Follow-up echo February 2018 shows remarkable normalization of LV EF now 60-65%.  Gradients across the aortic valve prosthesis are only moderately elevated after normalization of LV function (peak 36, mean 21). He has an approximately 90% stenosis of the right renal artery that has been stable in severity for years.   Pacemaker interrogation shows normal device function ([employer] Allure). Estimated generator longevity is 6-7 years. He has 69% atrial pacing and virtually 100% biventricular pacing. Lead parameters are excellent.  LV pacing is in the M3-P4 configuration. No atrial fibrillation has been documented. Corvue demonstrates a recent drop in thoracic impedance suggestive of some fluid overload over the last week or so.   Past Medical History:  Diagnosis Date  . Allergy   . Aortic valve stenosis, acquired    a. s/p Porcine AVR 2001;  b. 09/2014 [place] Echo: mod-sev bioprosthetic AS w/ minimal change in CO and only mild increase in gradients w/ dobutamine.   Marland Kitchen CAD (coronary artery disease)    a. s/p CABG with LIMA-LAD in 2001 with AVR. b. BMS to LCx 8/08. c. Abnl nuc/dec EF 05/2014 - s/p cath with patent LIMA to LAD, patent dominant right, mild LCx disease, patent stent. Med rx.  . Chronic combined systolic (congestive) and diastolic (congestive) heart failure    a. EF 50-55% in 02/2013;  b. 05/2014 Echo: EF 20-25%, Gr1 DD.  Marland Kitchen Diverticulitis   . ED (erectile dysfunction)   . Essential hypertension   . GERD (gastroesophageal reflux disease)   . Hyperlipidemia   . MALT lymphoma ([place])   . Nonischemic cardiomyopathy ([place])    a. 05/2014  Echo: EF 20-25%-->f/u cath w/ nonobs dzs.  . Osteoarthritis   . Osteoporosis   . Parkinson's disease   . Renal artery stenosis (HCC)    a. 90% RRA stenosis previously. b. Duplex 09/2014: R renal artery 60-99%, L renal artery 1-59%.  . S/P cardiac pacemaker procedure, Medtronic Adapta L  ADDRL1, 01/21/12    a. 01/2012 s/p MDT [place] PPM (ser #  [id number]).  . Symptomatic bradycardia    a. 01/2012 s/p MDT [place] PPM (ser # [id number]).  Marland Kitchen TIA (transient ischemic attack) 06/08   a. 09/2006    Past Surgical History:  Procedure Laterality Date  . AORTIC VALVE REPLACEMENT  2001   Porcine valve  . CARDIAC CATHETERIZATION  06/20/2014   Procedure: CORONARY/GRAFT ANGIOGRAPHY;  Surgeon: Lorretta Harp, MD;  Location: [facility] CATH LAB;  Service: Cardiovascular;;  . CATARACT EXTRACTION  06/09   left  . CORONARY ARTERY BYPASS GRAFT  2001   ( van trigt ) aortic valve replacement 2001; LIMA-LAD.  Marland Kitchen CORONARY STENT PLACEMENT  11/2007   8/09  Left Circumflex Stent by Dr Toy Care started and aggrenox stopped  . EP IMPLANTABLE DEVICE N/A 10/30/2015   Procedure: BiV Pacemaker Upgrade;  Surgeon: Thompson Grayer, MD;  Location: [facility] LAB;  Service: Cardiovascular;  Laterality: N/A;  . ESOPHAGOGASTRODUODENOSCOPY     multiple, Colon/ EGD benign 11/2004  . EYE SURGERY  06/18/15  . INGUINAL HERNIA REPAIR     LIH 1997Llap. bilateral hernias 1998  . LAPAROSCOPIC CHOLECYSTECTOMY  07/11   Dr.Rosenbower  . NM MYOCAR PERF WALL MOTION  11/22/2007   mild septal ischemia,EF 66%  . PACEMAKER INSERTION  01/21/2012   Medtronic Adapta L implanted for complete heart block  . PERMANENT PACEMAKER INSERTION N/A 01/21/2012   Procedure: PERMANENT PACEMAKER INSERTION;  Surgeon: Thompson Grayer, MD;  Location: [facility] CATH LAB;  Service: Cardiovascular;  Laterality: N/A;  . PILONIDAL CYST / SINUS EXCISION  1953  . SQUAMOUS CELL CARCINOMA EXCISION  3/13   back  . TOTAL HIP ARTHROPLASTY Left 01/06/2016   Procedure: TOTAL HIP ARTHROPLASTY ANTERIOR APPROACH;  Surgeon: Hessie Knows, MD;  Location: [facility] ORS;  Service: Orthopedics;  Laterality: Left;    Current Medications: Outpatient Medications Prior to Visit  Medication Sig Dispense Refill  . aspirin 81 MG tablet Take 81 mg by mouth daily.    . bisoprolol (ZEBETA) 5 MG tablet Take 1 tablet (5 mg total) by mouth  daily. 30 tablet 0  . carbidopa-levodopa (SINEMET CR) 50-200 MG tablet TAKE 1 TABLET 5 TIMES DAILY 450 tablet 1  . carbidopa-levodopa (SINEMET IR) 25-100 MG tablet Take 1 tablet by mouth 3 (three) times daily. 270 tablet 1  . lansoprazole (PREVACID) 15 MG capsule Take 15 mg by mouth daily as needed (Approx 2 times weekly). gerd    . Pimavanserin Tartrate (NUPLAZID) 17 MG TABS Take 2 tablets by mouth daily. 28 tablet 0  . Polyethyl Glycol-Propyl Glycol (SYSTANE ULTRA OP) Place 1 drop into both eyes daily.    . polyethylene glycol (MIRALAX / GLYCOLAX) packet Take 17 g by mouth daily.    . vitamin B-12 (CYANOCOBALAMIN) 1000 MCG tablet Take 1,000 mcg by mouth daily.    . potassium chloride (K-DUR) 10 MEQ tablet Take 2 tablets (20 mEq total) daily 90 tablet 3  . albuterol (PROVENTIL HFA;VENTOLIN HFA) 108 (90 Base) MCG/ACT inhaler Inhale 2 puffs into the lungs every 6 (six) hours as needed for wheezing or shortness of breath. (Patient not  taking: Reported on 07/23/2016) 1 Inhaler 0  . doxycycline (VIBRA-TABS) 100 MG tablet Take 1 tablet (100 mg total) by mouth 2 (two) times daily. (Patient not taking: Reported on 07/23/2016) 20 tablet 0  . furosemide (LASIX) 80 MG tablet Take 1 tablet (80 mg total) by mouth daily. 90 tablet 3   Facility-Administered Medications Prior to Visit  Medication Dose Route Frequency Provider Last Rate Last Dose  . DOBUTamine (DOBUTREX) 1,000 mcg/mL in dextrose 5% 250 mL infusion  5-20 mcg/kg/min (Order-Specific) Intravenous Continuous Carlena Bjornstad, MD 40.8 mL/hr at 09/20/14 1415 10 mcg/kg/min at 09/20/14 1415     Allergies:   Plavix [clopidogrel]; Pravastatin; Zocor [simvastatin]; and Penicillins   Social History   Social History  . Marital status: Married    Spouse name: N/A  . Number of children: 3  . Years of education: N/A   Occupational History  . retired- [employer]   . part-time sub/courier for schools- now only rarely    Social History Main Topics  .  Smoking status: Former Smoker    Packs/day: 1.00    Years: 5.00    Types: Cigarettes    Quit date: 06/06/1963  . Smokeless tobacco: Never Used  . Alcohol use No     Comment: very rare  . Drug use: No  . Sexual activity: No   Other Topics Concern  . None   Social History Narrative   Has living will   Wife, then son [name], hold health care POA   Requests DNR --done   Requests no tube feeds if cognitively unaware     Family History:  The patient's family history includes Asthma in his father; Heart disease in his brother.   ROS:   Please see the history of present illness.    ROS All other systems reviewed and are negative.   PHYSICAL EXAM:   VS:  BP 110/60   Pulse 60   Ht 5\' 6"  (1.676 m)   Wt 65.6 kg (144 lb 9.6 oz)   BMI 23.34 kg/m    GEN: Well nourished, well developed, in no acute distress  HEENT: normal  Neck: no JVD, carotid bruits, or masses Cardiac: Paradoxically split S2 , RRR; early peaking systolic ejection murmur in the aortic focus, no diastolic murmurs, rubs, or gallops,no edema .healthy pacemaker site  Respiratory:  clear to auscultation bilaterally, normal work of breathing GI: soft, nontender, nondistended, + BS MS: no deformity or atrophy  Skin: warm and dry, no rash Neuro:  Prominent resting tremor ; Alert and Oriented x 3, Strength and sensation are intact Psych: euthymic mood, full affect  Wt Readings from Last 3 Encounters:  07/23/16 65.6 kg (144 lb 9.6 oz)  07/01/16 63.7 kg (140 lb 6.4 oz)  05/21/16 62.6 kg (138 lb)      Studies/Labs Reviewed:   EKG:  EKG is ordered today.  AV sequential pacing is seen with small positive R-wave as initial deflection in V1-V2 consistent with LV pacing. QRS 136 ms.  QTC 488 ms  Recent Labs: 09/23/2015: Magnesium 2.1 12/18/2015: ALT <3 01/09/2016: Hemoglobin 11.4; Platelets 248 03/15/2016: Brain Natriuretic Peptide 209.8; BUN 12; Creat 0.75; Potassium 4.3; Sodium 139   Lipid Panel    Component Value  Date/Time   CHOL 163 03/29/2012 1138   TRIG 75.0 03/29/2012 1138   HDL 54.80 03/29/2012 1138   CHOLHDL 3 03/29/2012 1138   VLDL 15.0 03/29/2012 1138   LDLCALC 93 03/29/2012 1138   LDLDIRECT 98.3 07/21/2006  1521    ASSESSMENT:    1. Coronary artery disease involving native coronary artery of native heart without angina pectoris   2. Chronic systolic CHF (congestive heart failure) ([place])   3. Complete heart block ([place])   4. Pacemaker   5. Stenosis of prosthetic aortic valve   6. Essential hypertension   7. Renal artery stenosis (HCC)      PLAN:  In order of problems listed above:  1. CHF: His cardiomyopathySeems to have resolved in a remarkable fashion. CRT seems to have had a very positive affect, but clinically his functional status is not much better. He has possible orthopnea. I asked him to increase his furosemideup to 120 mg daily  and potassium to 30 mEq daily.has a follow-up appointment with Dr. Gwenlyn Found in May. 2. CHB: Pacemaker dependent 3. PPM: Normal device function, CRT-P. plan remote downloads every 3 months and yearly office visits. 4. CAD: Asymptomatic. All coronary conduits widely patent by fairly recent angiography. 5. AS: LVEF has returned to normal range and the gradients across the aortic valve remain in the moderately elevated territory. It's unlikely that the valve is the cause of his poor functional status. 6. HTN: Excellent control, prone to orthostatic hypotension due to the medicines for Parkinson's disease. Asked to call me promptly if he develops dizziness or syncope after the increase in diuretic dose. 7. Chronic stable left renal artery stenosis, normal renal function, not requiring RAAS inhibitors and unlikely to tolerate the    Medication Adjustments/Labs and Tests Ordered: Current medicines are reviewed at length with the patient today.  Concerns regarding medicines are outlined above.  Medication changes, Labs and Tests ordered today are listed in the  Patient Instructions below. Patient Instructions  Dr Sallyanne Kuster has recommended making the following medication changes: 1. INCREASE Furosemide to 120 mg - take 1.5 tablet daily 2. INCREASE Potassium to 30 mg - take 3 tablets daily  Remote monitoring is used to monitor your Pacemaker of ICD from home. This monitoring reduces the number of office visits required to check your device to one time per year. It allows [place] to keep an eye on the functioning of your device to ensure it is working properly. You are scheduled for a device check from home on Friday, July 6th, 2018. You may send your transmission at any time that day. If you have a wireless device, the transmission will be sent automatically. After your physician reviews your transmission, you will receive a postcard with your next transmission date.  Dr Sallyanne Kuster recommends that you schedule a follow-up appointment in 12 months with a pacemaker check. You will receive a reminder letter in the mail two months in advance. If you don't receive a letter, please call our office to schedule the follow-up appointment.  If you need a refill on your cardiac medications before your next appointment, please call your pharmacy.    Signed, Ian Klein, MD  07/23/2016 3:04 PM    [facility] [address], [place], [place]  [address] Phone: [phone]; Fax: [phone]

## 2016-07-26 NOTE — Progress Notes (Signed)
Ian [name] was seen today in the movement disorders clinic for f/u.  He is accompanied by his wife who supplements the history.  The first symptom(s) the patient noticed was tremor in the R hand when he would use the hand or have a cup of coffee in it.  His wife believes that this began in 2004.  He was referred to Dr. Tilden Dome.  He tried some medication and according to his wife, he kept having adverse SE.  He was on mirapex and sinemet but reported dizziness and bad dreams with both.   He was referred to Dr. Yevonne Pax in about 2010 because he was interested in DBS but the patient was concerned about risks of the surgery and potential side effects.    09/28/12  I tried to change the levodopa back to the IR formulation but he did not like it because he was too drowsy and went back to the CR formulation.  , 2 po qid and carbidopa/levodopa 50/200 at night.    He has continued to have more tremor after the artane was d/c.  However, this has become less bothersome for him over time.  He does notice that sometimes the medication wears off quickly and sometimes it does not.  It seems inconsistent.  03/06/13 update:  The patient is accompanied by his wife who supplements the history.  Last visit, we tried to add entacapone to each of the 4 carbidopa/levodopa dosing times.  He called me because of nausea and we decreased it to twice a day dosing.  He continued to have nausea and the medication was discontinued.  The patient states that the medication makes his arms feel "heavy." He has stopped the carbidopa/levodopa 50/200 at night as he does not think it was beneficial.  Sometimes, he will wake up in the middle of the night and take an extra of the carbidopa/levodopa CR 25 100.  He and his wife have noted some jerking spells at night.  He sleeps very restless. I did review notes in his chart since last visit.  He went to the emergency room on September 21 with a sensation of palpitations and tachycardia.  His  pacemaker was interrogated and there were no problems and he was subsequently sent home.  06/11/13 update:  This patient is accompanied in the office by his spouse who supplements the history.  Pt is on carbidopa/levodopa 25/100 CR - 2 in the AM and 6 other dosages throughout the day.  When he awakens in the middle of the night he may take a few extra dosages and he estimates he takes a total daily levodopa dose of 1000-1200 mg per day.  He has been very senstive to medication and has not been able to tolerate the immediate release carbidopa/levodopa, entacapone or klonopin (made arms/legs hurt).  Tremor feels like it is getting worse.  Vision is getting worse but he went to the eye doctor and his vision was stable.  He feels like the levodopa only lasts 1.5-2 hrs.  No falls.  Is exercising.    08/13/13 update:   This patient is accompanied in the office by his spouse who supplements the history.  Pt is on carbidopa/levodopa 25/100, and takes approximately 8-10 tablets per day.  Last visit, I also added amantadine to see if that would help the tremor. He does think that has helped.  He presents today to try and change to rytary to see if it would last longer.  He does not wish to  try duopa.  He denies hallucinations, lightheadedness, falls, syncope or dyskinesia.     10/01/13 update:  Last visit, I changed the patient to rytary.  It turned out to be very expensive as the patient had no RX coverage, which I did not realize.  When he changed back, the IR formulation was accidentally called in instead of the CR prepartation (and he didn't tolerate the IR in the past).  The patient also didn't notice that and he isn't even sure now what he is taking but knows he is taking 2 po qid of either the CR or IR.  He is having hallucinations.  He is having swelling in the feet and legs as well.   They saw cardiology last week and were started on lasix and they didn't think that it made a difference.  They are worried  about a discoloration in legs.  He has lost weight.  Had one fall on May 10.  Walking into bedroom, got lightheaded and hit his back on the dresser.  He didn't think that he hit his head at the time but now that they think that he did as he has had hallucinations since.  However, looking back at our notes, it was 5/7 when he called here to d/c the rytary and changed to the IR levodopa it was 5/10 when he fell so timing was likely just coincidental as he had likely just changed back to the IR preparation.  11/12/13 update:  The patient was seen back in followup in the neurology clinic, accompanied by his wife who helps to supplement the history.  Patient has a history of Parkinson's disease.  Last visit, I changed his levodopa back to the CR preparation to see if that would help his hallucinations.  He is currently on 2 tablets by mouth 4 times per day (8am/12/4pm/8pm).  Today, the patient states that hallucinations continue.  He states that he continues to see people.   Medication helps tremor when he first takes it and then the tremor comes back before the next dose.   Last visit, I told him to discontinue his amantadine because of complaints of swelling.  I did review records since our last visit.  Because he noticed no changes in swelling after discontinuation of the amantadine, he followed up with his primary care physician.  His primary care physician felt that the swelling was likely due to venous insufficiency and he recommended that the patient restart the amantadine and he has.  The patient does state that he doesn't think that he had any break between d/c the IR carbidopa/levodopa 25/100, starting the CR version and when he restarted the amantadine.   The patient did see his cardiologist and he saw no cardiac reason for the edema.  He did not want to increase the patient's Lasix further because of complaints of orthostasis and dizziness.  No significant etiology was determined for his weight loss  either.  01/07/14 update:  The patient was seen today in followup in the neurology clinic, accompanied by his wife who helps to supplement the history.  The patient has switched back on his own from the CR version of carbidopa/levodopa to the immediate release form.  He is on 2 po 5 times per day (8am and then every 3 hours).  He is complaining about more tremor and more balance issues.  No falls but near falls.  He is off of amantadine.  Hallucinations went away after it was d/c but tremor definitely picked up.  Leg swelling is better but not gone.  Some memory problems.  No driving since march, 2015.    05/06/14 update:  The patient is seen in f/u, accompanied by his wife who supplements the history.   He is on carbidopa/levodopa 25/100 CR, 2 po 5 times per day (8am and then every 3 hours). He continues to c/o increasing tremor.  States that he is having m. Cramping at nighttime.  His last dose is at 8pm and bedtime is at 10 pm and cramping will start waking him at 3:30 am. Asks about trying to take a combination of one CR and one IR and see how that works.   He states that his memory is "going downhill" although his wife is not quite as sure.  He doesn't drive and hasn't for a year but has not trouble with pills.    08/06/14 update:  The patient is following up today, accompanied by his wife who supplements the history.  Last visit, the patient wanted to try a combination of carbidopa levodopa 25/100 immediate release and extended release so that he was taking 1 pill of each 5 times per day; however he really never did that and is still taking a carbidopa/levodopa 25/100 IR, 2 po 5 times a day.  We did add a carbidopa/levodopa 50/200 at night to see if that would help with his cramping;  The cramping is better at night and he only seems to have it if he has swelling.  His wife mentions that memory is not quite as good as it used to be.  He has some difficulty remembering previous conversations and having word  finding difficulties.  It is not a major issue, but it is something she is noticing.  Much has happened in his other medical history since last visit.  I reviewed those records.  The patient had a nuclear stress test on 06/13/2014, demonstrating a high risk study with a large area of ischemia in the LAD artery distribution and a fixed defect in the inferior wall.  An echocardiogram demonstrated a left ventricular ejection fraction of approximately 20%, which was much decreased compared to his prior study of 50-55%.  A heart catheterization was subsequently scheduled and performed on 06/20/2014.  There was no evidence of ischemia to account for the dropped ejection fraction.  It was felt that he was likely not a candidate for an ICD and they talked about CRT and decided to hold on that.  They saw Dr. Rayann Heman yesterday.  12/03/14 update:  The patient presents today, accompanied by his wife who supplements the history.  The records that were made available to me were reviewed.  Last visit, we talked about our limited options, and ultimately decided to try a combination of carbidopa/levodopa 25/100 IR and carbidopa/levodopa 25/100 CR, one tablet each of these 5 times per day.  There was some misunderstanding and he ended up taking carbidopa/levodopa 25/100 IR in addition to carbidopa/levodopa 50/200 CR, 5 times per day.  He was then taking the carbidopa/levodopa 50/200 at night as well.  Overall, this helped the tremor but hallucinations picked up and therefore he went back to carbidopa/levodopa 25/100 IR, 2 po 5 times a day.  He is having more difficulty opening his hands in the morning.  Pts biggest c/o is vision change and he thinks that it is from his PD meds.  Been to [facility] in November and I reviewed those records and they said that they thought that it was primarily surface related  but didn't think that it was significant.  He tried eye drops for dry eye and nothing helped.  He asks me multiple times what else he  can do.  His wife also asks me about a referral for a consultation regarding focused ultrasound.  04/04/15 update:  The patient is following up today, accompanied by his wife who supplements the history.  He has a history of Parkinson's disease.  He is on carbidopa/levodopa 25/100, 2 tablets 5 times per day and he went back on carbidopa/levodopa 50/200 qhs.  Last visit, he would not levodopa was affecting his vision, although I did not.  Regardless, we decided to stop the medication for a few days.  He was not able to tolerate being off the medication because tremor was so bad and ended up restarting it fairly quickly after stopping it.  Tremor continues to be bothersome and asks if there is anything else he can do.  Depression is getting worse because of that.  No suicidal or homicidal ideation.  He is doing yoga with his wife and enjoys that.  He has had no falls but has had some near falls.  I did refer him to Dr. Sanda Klein regarding his vision change and he is going in Jan (pt moved appt back).  His wife had also requested a referral last visit to [facility] to possible focused ultrasound, but ultimately they decided that they did not want to go to the appointment.  I think that is probably a good decision as focused u/s is not indicated yet for Parkinsons disease.  They do bring in a brochure today about duopa and ask me about that today.  Also saw an article in paper about study Dr. Jimmey Ralph is doing at [facility] and he called the number.  He showed me the article today and it was for patients who have had little weak body dementia and Parkinson's dementia with hallucinations.  He has begun to have a few more hallucinations since last visit, but reports that he knows that they are not real.  07/10/15 update:  The patient is following up today, accompanied by his wife who supplements the history.  He has a history of Parkinson's disease.  He is on carbidopa/levodopa 25/100, 2 tablets 5 times per day (8/11/2/5/8) in  addition to carbidopa/levodopa 50/200 at night (10pm). He thinks that the medication wears off before next visit and asks about taking the 50/200 in addition to one 25/100.  I reminded him that he accidentally tried that last august but he had more hallucinations.  He states that he just tried that recently with the 25/100 just like he did last august and didn't have the hallucinations.  He did try Neupro since our last visit and worked up to 4 mg.  He really did not find this beneficial.  He did not have side effects with that.  It did not make hallucinations worse, and in fact he thought they were potentially even better.  He did not want to go up to 6 mg and ended up discontinuing the medication.  He has not had any falls since last visit but does feel that he has been more unstable.  Sometimes will yell out at night but seems infrequent.  No hallucinations or near syncope.  I did review records since last visit.  He was seen back at [facility] and ended up having eye surgery for lower eyelid retraction bilaterally on March 1.    11/24/15 update:  The patient is following  up today, accompanied by his wife who supplements the history.  After our last visit, the patient was supposed to be on carbidopa/levodopa 50/200 CR 5 times per day and no more than carbidopa/levodopa 25/100 3 times per day.  He called me quite some time after last visit to state that he had gone up on the carbidopa/levodopa 50/200, to 6 times per day and carbidopa/levodopa 25/100, 5 times per day and subsequently hallucinations had become very prominent.  I explained that unfortunately, this is why I did not want him to go up on the medication, as he was not a Nuplazid candidate because of the fact that he already had QT prolongation.  He went back to taking them as he was supposed to but he is still having some hallucinations.  He recognizes that they aren't real.  No falls since our last visit.  He is exercising some but gets tired quickly.   They asked me about xadago.   On July 13, he did undergo surgical placement of a new pacemaker.  He didn't notice a big difference in stamina with the new PPM but he has been able to breathe better.    12/26/15 update:  Pt returns for f/u, accompanied by his wife who supplements the history.  He is still on carbidopa/levodopa 25/100 3 times a day and carbidopa/levodopa 50/200 CR five times a day.  Pt tells me he is actually taking carbidopa/levodopa 50/200, 2 po 5 times a day but wife doesn't think he is doing it that way.  She isn't sure but has realized she needs to manage meds.  He will sometimes get up in the middle of the night and take an extra carbidopa/levodopa 25/100 and an extra 50/200.  Last visit the patient really wanted to try xadago because he had a friend on it who was doing great.  I was not convinced it would be that helpful and initially he decided to not try it because of this but ultimately did try it.  Pts wife noted confusion in the patient and she initially thought that it was the medication (although she had noted confusion prior to that and it was documented in a phone encounter).  The medication was d/c.  He has begun to have paranoia and has heard noises in the house in addition to visual hallucinations.  Has called his wife a liar when she tells him that no one is there.  Wife states that confusion continues.  He has asked his wife "how long he will live in this house" when they have owned that house for years.  Wife states that hallucinations have become more "complex."  Had UA that was neg (except dehydration).  Urine cx was negative.  Wife also notes more unsteady on feet  03/25/16 update:  Patient returns for follow-up, accompanied by his wife who supplements the history. He is still on carbidopa/levodopa 50/200 CR 5 times a day and carbidopa/levodopa 25/100 IR 3 times a day.  I had planned to see him about a week and a half after her last visit, but unfortunately he fell and  fractured his femur on the left and had surgery on 01/06/2016.  He subsequently went to [facility] for rehabilitation.  He did have post op delirium.  Wife states that "confusion looms."  Didn't quite get back to baseline.  He is back at home.  He is having more hallucinations.  They are now not just visual but auditory as well.  Confuses wifes position in  the home - will say "are you the one who cooks for me?"  Will then say, "who is the person who sleeps in my bed."  07/28/16 update:  Patient returns today for follow-up, accompanied by his wife who supplements the history.  Patient is on carbidopa/levodopa 50/200 5 times per day and carbidopa/levodopa 25/100 3 times per day.  Wife notes that after 2.5 hours, tremors increase and she worries about changing med.  With the assistance and permission of cardiology, Nuplazid was started last visit.  His QT did lengthen somewhat after that, but his cardiologist felt that it was okay to continue it and follow the EKG.  His last EKG was just done on 07/23/2016.  His QTC prior to Nuplazid was 438.  His QTC on 04/09/2016 was 487.  His QTC on 07/23/2016 was 488.  Because it had been stable, cardiology felt that it was okay to continue the medication.  Wife states that hallucinations are worse as are delusions. Most hallucinations are in the form of humans.  Most are not frightening.  He will ask wife for reassurance that they aren't there and recognizes they aren't there but wife states that he won't recognize that delusions aren't normal.  He is convinced that wife leaves house in the middle of the night and goes out in the middle of the night and goes out with others.  He also thinks that someone else has access to the bank account.   She would like to get off of it.  Few minor falls since last visit.  One missed a chair.  One fell between 2 chairs.  Few near falls.    Neuroimaging has previously been performed.  It is not available for my review today.  PREVIOUS  MEDICATIONS: Sinemet (reported bad dreams/dizziness on regular formulation and same with Mirapex); requip;  on Sinemet CR currently, entacapone (felt dizzy and arms felt "heavy"); klonopin (made arms/legs hurt); artane; amantadine (hallucinations); rytary (costly and pt has no RX coverage); neupro; nuplazid  ALLERGIES:   Allergies  Allergen Reactions  . Plavix [Clopidogrel]     Fatigue   . Pravastatin     Muscle aches  . Zocor [Simvastatin]     REACTION: dizziness  . Penicillins Rash    Has patient had a PCN reaction causing immediate rash, facial/tongue/throat swelling, SOB or lightheadedness with hypotension: YES Has patient had a PCN reaction causing severe rash involving mucus membranes or skin necrosis: NO Has patient had a PCN reaction that required hospitalization NO Has patient had a PCN reaction occurring within the last 10 years: NO If all of the above answers are "NO", then may proceed with Cephalosporin use.    CURRENT MEDICATIONS:  Current Outpatient Prescriptions on File Prior to Visit  Medication Sig Dispense Refill  . aspirin 81 MG tablet Take 81 mg by mouth daily.    . bisoprolol (ZEBETA) 5 MG tablet Take 1 tablet (5 mg total) by mouth daily. 30 tablet 0  . carbidopa-levodopa (SINEMET CR) 50-200 MG tablet TAKE 1 TABLET 5 TIMES DAILY 450 tablet 1  . carbidopa-levodopa (SINEMET IR) 25-100 MG tablet Take 1 tablet by mouth 3 (three) times daily. 270 tablet 1  . furosemide (LASIX) 80 MG tablet Take 1.5 tablets (120 mg total) by mouth daily. 135 tablet 3  . lansoprazole (PREVACID) 15 MG capsule Take 15 mg by mouth daily as needed (Approx 2 times weekly). gerd    . Pimavanserin Tartrate (NUPLAZID) 17 MG TABS Take 2 tablets by mouth  daily. 28 tablet 0  . Polyethyl Glycol-Propyl Glycol (SYSTANE ULTRA OP) Place 1 drop into both eyes daily.    . polyethylene glycol (MIRALAX / GLYCOLAX) packet Take 17 g by mouth daily.    . potassium chloride (K-DUR) 10 MEQ tablet Take 3 tablets  (30 mEq total) by mouth daily. 270 tablet 3  . vitamin B-12 (CYANOCOBALAMIN) 1000 MCG tablet Take 1,000 mcg by mouth daily.     Current Facility-Administered Medications on File Prior to Visit  Medication Dose Route Frequency Provider Last Rate Last Dose  . DOBUTamine (DOBUTREX) 1,000 mcg/mL in dextrose 5% 250 mL infusion  5-20 mcg/kg/min (Order-Specific) Intravenous Continuous Carlena Bjornstad, MD 40.8 mL/hr at 09/20/14 1415 10 mcg/kg/min at 09/20/14 1415    PAST MEDICAL HISTORY:   Past Medical History:  Diagnosis Date  . Allergy   . Aortic valve stenosis, acquired    a. s/p Porcine AVR 2001;  b. 09/2014 [place] Echo: mod-sev bioprosthetic AS w/ minimal change in CO and only mild increase in gradients w/ dobutamine.   Marland Kitchen CAD (coronary artery disease)    a. s/p CABG with LIMA-LAD in 2001 with AVR. b. BMS to LCx 8/08. c. Abnl nuc/dec EF 05/2014 - s/p cath with patent LIMA to LAD, patent dominant right, mild LCx disease, patent stent. Med rx.  . Chronic combined systolic (congestive) and diastolic (congestive) heart failure    a. EF 50-55% in 02/2013;  b. 05/2014 Echo: EF 20-25%, Gr1 DD.  Marland Kitchen Diverticulitis   . ED (erectile dysfunction)   . Essential hypertension   . GERD (gastroesophageal reflux disease)   . Hyperlipidemia   . MALT lymphoma ([place])   . Nonischemic cardiomyopathy ([place])    a. 05/2014 Echo: EF 20-25%-->f/u cath w/ nonobs dzs.  . Osteoarthritis   . Osteoporosis   . Parkinson's disease   . Renal artery stenosis ([facility])    a. 90% RRA stenosis previously. b. Duplex 09/2014: R renal artery 60-99%, L renal artery 1-59%.  . S/P cardiac pacemaker procedure, Medtronic Adapta L  ADDRL1, 01/21/12    a. 01/2012 s/p MDT [place] PPM (ser # [id number]).  . Symptomatic bradycardia    a. 01/2012 s/p MDT [place] PPM (ser # [id number]).  Marland Kitchen TIA (transient ischemic attack) 06/08   a. 09/2006    PAST SURGICAL HISTORY:   Past Surgical History:  Procedure Laterality Date  . AORTIC  VALVE REPLACEMENT  2001   Porcine valve  . CARDIAC CATHETERIZATION  06/20/2014   Procedure: CORONARY/GRAFT ANGIOGRAPHY;  Surgeon: Lorretta Harp, MD;  Location: [facility] CATH LAB;  Service: Cardiovascular;;  . CATARACT EXTRACTION  06/09   left  . CORONARY ARTERY BYPASS GRAFT  2001   ( van trigt ) aortic valve replacement 2001; LIMA-LAD.  Marland Kitchen CORONARY STENT PLACEMENT  11/2007   8/09  Left Circumflex Stent by Dr Toy Care started and aggrenox stopped  . EP IMPLANTABLE DEVICE N/A 10/30/2015   Procedure: BiV Pacemaker Upgrade;  Surgeon: Thompson Grayer, MD;  Location: [facility] CV LAB;  Service: Cardiovascular;  Laterality: N/A;  . ESOPHAGOGASTRODUODENOSCOPY     multiple, Colon/ EGD benign 11/2004  . EYE SURGERY  06/18/15  . INGUINAL HERNIA REPAIR     LIH 1997Llap. bilateral hernias 1998  . LAPAROSCOPIC CHOLECYSTECTOMY  07/11   Dr.Rosenbower  . NM MYOCAR PERF WALL MOTION  11/22/2007   mild septal ischemia,EF 66%  . PACEMAKER INSERTION  01/21/2012   Medtronic Adapta L implanted for complete heart block  .  PERMANENT PACEMAKER INSERTION N/A 01/21/2012   Procedure: PERMANENT PACEMAKER INSERTION;  Surgeon: Thompson Grayer, MD;  Location: [facility] CATH LAB;  Service: Cardiovascular;  Laterality: N/A;  . PILONIDAL CYST / SINUS EXCISION  1953  . SQUAMOUS CELL CARCINOMA EXCISION  3/13   back  . TOTAL HIP ARTHROPLASTY Left 01/06/2016   Procedure: TOTAL HIP ARTHROPLASTY ANTERIOR APPROACH;  Surgeon: Hessie Knows, MD;  Location: [facility] ORS;  Service: Orthopedics;  Laterality: Left;    SOCIAL HISTORY:   Social History   Social History  . Marital status: Married    Spouse name: N/A  . Number of children: 3  . Years of education: N/A   Occupational History  . retired- [employer]   . part-time sub/courier for schools- now only rarely    Social History Main Topics  . Smoking status: Former Smoker    Packs/day: 1.00    Years: 5.00    Types: Cigarettes    Quit date: 06/06/1963  . Smokeless tobacco: Never  Used  . Alcohol use No     Comment: very rare  . Drug use: No  . Sexual activity: No   Other Topics Concern  . Not on file   Social History Narrative   Has living will   Wife, then son [name], hold health care POA   Requests DNR --done   Requests no tube feeds if cognitively unaware    FAMILY HISTORY:   Family Status  Relation Status  . Mother Deceased at age 71   old age  . Father Deceased at age 52   acute bronchitis, cotton exposure  . Sister Deceased   trauma  . Brother Deceased   2, deceased  . Daughter Deceased at age 45   osteosarcoma  . Child Alive   3, healthy  . Brother Alive   healthy  . Sister Alive   healthy    ROS:    A complete 10 system review of systems was obtained and was unremarkable apart from what is mentioned above.  PHYSICAL EXAMINATION:    VITALS:   Vitals:   07/28/16 1050  BP: 120/64  Pulse: 64  SpO2: 95%  Weight: 146 lb (66.2 kg)  Height: 5\' 6"  (1.676 m)   Wt Readings from Last 3 Encounters:  07/28/16 146 lb (66.2 kg)  07/23/16 144 lb 9.6 oz (65.6 kg)  07/01/16 140 lb 6.4 oz (63.7 kg)     GEN:  The patient appears stated age and is in NAD.   HEENT:  Normocephalic, atraumatic.  The mucous membranes are moist. The superficial temporal arteries are without ropiness or tenderness. CV:  RRR Lungs:  CTAB Neck/HEME:  There are no carotid bruits bilaterally.  Neurological examination:  Orientation: The patient is alert and oriented x2. Montreal Cognitive Assessment  12/26/2015 12/03/2014  Visuospatial/ Executive (0/5) 0 2  Naming (0/3) 3 3  Attention: Read list of digits (0/2) 2 1  Attention: Read list of letters (0/1) 1 0  Attention: Serial 7 subtraction starting at 100 (0/3) 1 2  Language: Repeat phrase (0/2) 2 2  Language : Fluency (0/1) 0 0  Abstraction (0/2) 2 1  Delayed Recall (0/5) 1 2  Orientation (0/6) 4 5  Total 16 18  Adjusted Score (based on education) 17 18    Movement examination: Tone: There is  normal tone in the bilateral upper extremities today.  The tone in the lower extremities is normal.  Abnormal movements: There is a moderate resting tremor bilaterally,  R>L.  No dyskinesia. Coordination:  There is no decremation, with any form of RAMS, including alternating supination and pronation of the forearm, hand opening and closing, finger taps, heel taps and toe taps. Gait and Station: The patient uses the cane to ambulate and does well with that.  Lab Results  Component Value Date   WBC 9.4 01/09/2016   HGB 11.4 (L) 01/09/2016   HCT 32.2 (L) 01/09/2016   MCV 91.3 01/09/2016   PLT 248 01/09/2016     Chemistry      Component Value Date/Time   NA 139 03/15/2016 1342   K 4.3 03/15/2016 1342   CL 97 (L) 03/15/2016 1342   CO2 32 (H) 03/15/2016 1342   BUN 12 03/15/2016 1342   CREATININE 0.75 03/15/2016 1342      Component Value Date/Time   CALCIUM 9.7 03/15/2016 1342   ALKPHOS 64 12/18/2015 1045   AST 13 12/18/2015 1045   ALT <3 (L) 12/18/2015 1045   BILITOT 0.5 12/18/2015 1045     Lab Results  Component Value Date   VITAMINB12 660 03/06/2013   Lab Results  Component Value Date   TSH 4.349 06/17/2014     ASSESSMENT/PLAN:  1.  Idiopathic Parkinsons disease.    -Long discussion again with the patient and his wife.  Unfortunately, he really is in a position where we cannot go up further on the levodopa because of hallucinations and he is not a candidate for Nuplazid/seroquel because of prolonged QT.  He is not a DBS candidate because of multiple other medical problems.  He will take carbidopa/levodopa 50/200 CR, 5 times per day and carbidopa/levodopa 25/100 IR, up to 3times per day.   May need to decrease this but wife doesn't want to right now.  Would perhaps help with hallucinations 2.  Parkinson disease dementia with hallucinations  -on Nuplazid 34 mg daily, which didn't help and wife thinks that made him worse.  Likely the disease just got worse and will d/c.     -start seroquel, 25 mg qhs.  Likely will need daytime dosages but need to get used to the medication.  We did talk about the fact that the atypical antipsychotic medications are not indicated for dementia related psychosis and increased risk of mortality in the elderly, usually because of infectious or  cardiac related. Understanding is expressed and they were agreeable that the benefits outweigh the risks in this case.  -wife asked me about taking trip to [place] and if I had objection.  Told them that I would first try a local trip to see how he would tolerate being out of environment (local hotel/trip to beach) 3.  B12 deficiency.  -He is on oral supplements  4.  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  -He could not tolerate klonopin.  Is overall mild and talked about home safety 5.  I. will see him back in the next 4 months.  Much greater than 50% of this visit was spent in counseling and coordinating care.  Total face to face time:  40 min

## 2016-07-28 ENCOUNTER — Encounter: Payer: Self-pay | Admitting: Neurology

## 2016-07-28 ENCOUNTER — Ambulatory Visit (INDEPENDENT_AMBULATORY_CARE_PROVIDER_SITE_OTHER): Payer: Medicare Other | Admitting: Neurology

## 2016-07-28 VITALS — BP 120/64 | HR 64 | Ht 66.0 in | Wt 146.0 lb

## 2016-07-28 DIAGNOSIS — F0280 Dementia in other diseases classified elsewhere without behavioral disturbance: Secondary | ICD-10-CM

## 2016-07-28 DIAGNOSIS — R441 Visual hallucinations: Secondary | ICD-10-CM | POA: Diagnosis not present

## 2016-07-28 DIAGNOSIS — G20 Parkinson's disease: Secondary | ICD-10-CM | POA: Diagnosis not present

## 2016-07-28 DIAGNOSIS — I701 Atherosclerosis of renal artery: Secondary | ICD-10-CM | POA: Diagnosis not present

## 2016-07-28 MED ORDER — QUETIAPINE FUMARATE 25 MG PO TABS: 25.0000 mg | ORAL_TABLET | Freq: Two times a day (BID) | ORAL | 2 refills | Status: DC

## 2016-07-28 NOTE — Patient Instructions (Addendum)
1. Start Seroquel 25 mg at night. We will call in a couple weeks to see how you are doing.  2. Stop Nuplazid.

## 2016-08-04 ENCOUNTER — Other Ambulatory Visit: Payer: Self-pay | Admitting: Neurology

## 2016-08-11 LAB — CUP PACEART INCLINIC DEVICE CHECK
Implantable Lead Implant Date: 20131004
Implantable Lead Implant Date: 20131004
Implantable Lead Implant Date: 20170713
Implantable Lead Location: 753858
Lead Channel Setting Pacing Amplitude: 2 V
Lead Channel Setting Pacing Pulse Width: 1 ms
Lead Channel Setting Sensing Sensitivity: 4 mV
MDC IDC LEAD LOCATION: 753859
MDC IDC LEAD LOCATION: 753860
MDC IDC PG IMPLANT DT: 20170713
MDC IDC SESS DTM: 20180425135001
MDC IDC SET LEADCHNL LV PACING AMPLITUDE: 2.375
MDC IDC SET LEADCHNL RV PACING AMPLITUDE: 2.5 V
MDC IDC SET LEADCHNL RV PACING PULSEWIDTH: 0.4 ms
Pulse Gen Model: 3262
Pulse Gen Serial Number: 7933792

## 2016-08-12 ENCOUNTER — Telehealth: Payer: Self-pay | Admitting: Neurology

## 2016-08-12 NOTE — Telephone Encounter (Signed)
Called patient to see how he is doing on Seroquel 25 mg tablets.  Patient is doing well. She states he is eating better, engages more, and sleeping well. He is still having hallucinations but overall much better.  They would like to stay on medication as is for now. They will call with any problems.

## 2016-08-16 ENCOUNTER — Ambulatory Visit (INDEPENDENT_AMBULATORY_CARE_PROVIDER_SITE_OTHER): Payer: Medicare Other

## 2016-08-16 DIAGNOSIS — I5022 Chronic systolic (congestive) heart failure: Secondary | ICD-10-CM | POA: Diagnosis not present

## 2016-08-16 DIAGNOSIS — Z950 Presence of cardiac pacemaker: Secondary | ICD-10-CM

## 2016-08-16 NOTE — Progress Notes (Signed)
EPIC Encounter for ICM Monitoring  Patient Name: Ian [name] is a 83 y.o. male Date: 08/16/2016 Primary Care Physican: Viviana Simpler, MD Primary Cardiologist:Croitoru Electrophysiologist: Allred Dry Weight:135 lbs Bi-V Pacing: >99%       Spoke to wife.  Heart Failure questions reviewed, pt asymptomatic.   Thoracic impedance normal but was abnormal suggesting fluid accumulation from 08/03/2016 to 08/07/2016.  Prescribed and confirmed dosage: Furosemide 80 mg 1 tablet by mouth daily.  Potassium 10 mEq 2 tablets by mouth daily.    Labs: 11/27/2017Creatinine 0.75, BUN 12, Potassium 4.3, Sodium 136 01/09/2016 Creatinine 0.67, BUN 15, Potassium 4.0, Sodium 135, EGFR >60  01/07/2016 Creatinine 0.79, BUN 21, Potassium 4.2, Sodium 139 01/06/2016 Creatinine 0.82, BUN 19, Potassium 4.0, Sodium 139 12/18/2015 Creatinine 0.94, BUN 14, Potassium 4.5, Sodium 142 10/23/2015 Creatinine 0.89, BUN 17, Potassium 4.3, Sodium 139 09/23/2015 Creatinine 0.73, BUN 14, Potassium 5.2, Sodium 138  Recommendations: No changes. Discussed to limit salt intake to 2000 mg/day and fluid intake to < 2 liters/day.  Encouraged to call for fluid symptoms.  Follow-up plan: ICM clinic phone appointment on 09/16/2016.  Office appointment scheduled on 09/16/2016 with Dr Gwenlyn Found.  Copy of ICM check sent to device physician.   3 month ICM trend: 08/16/2016   1 Year ICM trend:      Rosalene Billings, RN 08/16/2016 8:31 AM

## 2016-09-03 ENCOUNTER — Telehealth: Payer: Self-pay | Admitting: Neurology

## 2016-09-03 NOTE — Telephone Encounter (Signed)
Caller: [name]  Urgent? No  Reason for the call: VM-Message left to have Dr Tat or Luvenia Starch give her a call back and did not say why in the message left

## 2016-09-03 NOTE — Telephone Encounter (Signed)
Left message on machine for patient's wife to call back.   

## 2016-09-03 NOTE — Telephone Encounter (Signed)
Patient's wife made aware.

## 2016-09-03 NOTE — Telephone Encounter (Signed)
Spoke with patient's wife and she states patient is much better on Seroquel 25 mg one tablet at night, but she did wonder about increasing.  He is still having hallucinations/delusions that are not any better and may be a bit worse.  His mood is much better and he is sleeping very well. It does make him sleepy and he is down from 10 pm - 6: 30 am every night.  She doesn't wish to add a dosage in the morning due to the sleepiness.  He has also been pretty emotional lately per wife. He will tear up pretty easily. She doesn't describe this as pseudobular but more like he gets frustrated at the difficulty he has trying to get words out and gets emotional.  Please advise on dosage for Seroquel.

## 2016-09-03 NOTE — Telephone Encounter (Signed)
Try it for a few days.

## 2016-09-03 NOTE — Telephone Encounter (Signed)
Does she think that he could tolerate just a 1/2 tablet in the AM or would that make him too sleepy?

## 2016-09-03 NOTE — Telephone Encounter (Signed)
She states that it made him very sleepy, but I am sure they would try it.

## 2016-09-07 ENCOUNTER — Telehealth: Payer: Self-pay | Admitting: Neurology

## 2016-09-07 NOTE — Telephone Encounter (Signed)
Patient's wife called back. She states that patient tried 1/2 tablet of Seroquel in the morning for 4 days. Each day he experienced blurred vision, dizziness, dry mouth, and worsening tremors. She did not give him the Seroquel this morning. His tremors are still bad today. Please advise. [phone].

## 2016-09-07 NOTE — Telephone Encounter (Signed)
Go ahead and d/c the AM dose.  Unfortunately, there really are no alternatives.

## 2016-09-07 NOTE — Telephone Encounter (Signed)
Left message on machine for patient to call back.

## 2016-09-07 NOTE — Telephone Encounter (Signed)
Patient wife states that the tremors have greatly increased and has some questions about medication

## 2016-09-08 NOTE — Telephone Encounter (Signed)
LMOM making patient's wife aware of Dr. Doristine Devoid advise.

## 2016-09-14 ENCOUNTER — Ambulatory Visit (INDEPENDENT_AMBULATORY_CARE_PROVIDER_SITE_OTHER): Payer: Medicare Other | Admitting: Cardiovascular Disease

## 2016-09-14 ENCOUNTER — Encounter: Payer: Self-pay | Admitting: Cardiovascular Disease

## 2016-09-14 DIAGNOSIS — I701 Atherosclerosis of renal artery: Secondary | ICD-10-CM | POA: Diagnosis not present

## 2016-09-14 DIAGNOSIS — I5042 Chronic combined systolic (congestive) and diastolic (congestive) heart failure: Secondary | ICD-10-CM

## 2016-09-14 DIAGNOSIS — I10 Essential (primary) hypertension: Secondary | ICD-10-CM | POA: Diagnosis not present

## 2016-09-14 DIAGNOSIS — I2510 Atherosclerotic heart disease of native coronary artery without angina pectoris: Secondary | ICD-10-CM | POA: Diagnosis not present

## 2016-09-14 DIAGNOSIS — E7800 Pure hypercholesterolemia, unspecified: Secondary | ICD-10-CM

## 2016-09-14 DIAGNOSIS — I350 Nonrheumatic aortic (valve) stenosis: Secondary | ICD-10-CM | POA: Diagnosis not present

## 2016-09-14 DIAGNOSIS — I441 Atrioventricular block, second degree: Secondary | ICD-10-CM

## 2016-09-14 NOTE — Assessment & Plan Note (Signed)
History of porcine aVR at the time of bypass grafting in 2001. His most recent 2-D echo performed 06/11/16 revealed a valve area 0.77 cm with a peak gradient of 36 mmHg. We will continue to check his annual basis.

## 2016-09-14 NOTE — Assessment & Plan Note (Signed)
History of essential hypertension blood pressure measures 120/64. He is on Zebeta. Continue current meds at current dosing

## 2016-09-14 NOTE — Assessment & Plan Note (Signed)
History of CAD status post coronary artery bypass grafting in 2001 with a LIMA to LAD. He did have a bare metal stent stent placed to the circumflex by Dr. Timmothy Sours G8/18/09. I performed cardiac catheterization on him 06/20/14 revealing a patent LIMA to the LAD, 40-50% ostial smooth nondominant circumflex with moderate irregularities in the dominant RCA. Medical therapy was recommended. He denies chest pain.

## 2016-09-14 NOTE — Progress Notes (Signed)
09/14/2016 Ian [name]   [birth year]  [id number]  Primary Physician Venia Carbon, MD Primary Cardiologist: Lorretta Harp MD Renae Gloss  HPI:  The patient is an 83 year old, thin appearing, married, Caucasian male, father of three, grandfather to 71, who is accompanied by his wife today. He is formerly a patient of Dr. Durwin [facility]'s. I last saw him in the office 06/10/15. I saw him at the time of his right and left heart cardiac catheterization which I performed 06/20/14.Marland Kitchen He has a history of coronary artery disease status post bypass grafting in 2001 with a LIMA to his LAD and a porcine valve at that time. He did have a bare metal stent placed to his circumflex coronary artery by Dr. Adrian Prows on December 05, 2007. At that time, his LIMA was patent and his RCA had only mild disease. Abdominal aortography revealed a widely patent left renal artery with a 90% right renal artery stenosis. His last Myoview performed in 2009 was nonischemic. An echo performed in 2011 revealed an aortic valve area of 0.9 cm squared with an EF of 45 to 50% and mild concentric LVH. His other problems include hypertension and hyperlipidemia. He does have progressive Parkinson's disease. When I saw him in the office back in 2013 he was significantly bradycardic and underwent permanent transvenous pacemaker insertion which is currently followed by Dr. Sallyanne Kuster. He's had progressive shortness of breath or lower extremity edema as well as unexplained weight loss. He was placed on 40 mg of Lasix which resulted in some improvement in his edema and his dyspnea. He also has a 90% right renal artery stenosis by duplex ultrasound that has remained stable as recently as 09/26/13.physical saw him in July he has had increasing dyspnea on exertion. A 2-D echo performed 05/27/14 revealed a decline in his ejection fraction to 20-25% from 50-55% November 2014 for unclear reasons. A Myoview stress test showed inferolateral  ischemia. Based on this, I decided to proceed with outpatient cardiac catheterization to define his anatomy and physiology. I performed right and left heart cath on 06/20/14 revealing a patent LIMA to the LAD and otherwise noncritical CAD. I was unable to perform a complete right heart cath because of his pacemaker leads. The etiology of his LV dysfunction was unclear after this. He did see Dr. Rayann Heman back to explore possible upgrade to CRT and/or ICD therapy both of which were advised against. He had a dobutamine echo performed by Dr. Sallyanne Kuster to evaluate for low output aortic stenosis which was inconclusive.  He does have a living will with a written DO NOT RESUSCITATE at home. He also underwent recent radiation therapy by Dr. Rayann Heman in July 2016 resulting in marked improvement in his ejection fraction from 20-60%. He still complains of some orthopnea. A recent 2-D echo performed in February showed normal systolic function with moderate aortic stenosis. His aortic valve area was 0.77 cm with a peak gradient of 36 mmHg.   Current Outpatient Prescriptions  Medication Sig Dispense Refill  . aspirin 81 MG tablet Take 81 mg by mouth daily.    . bisoprolol (ZEBETA) 5 MG tablet Take 1 tablet (5 mg total) by mouth daily. 30 tablet 0  . carbidopa-levodopa (SINEMET CR) 50-200 MG tablet TAKE 1 TABLET 5 TIMES DAILY 450 tablet 1  . carbidopa-levodopa (SINEMET IR) 25-100 MG tablet TAKE 1 TABLET 3 TIMES A DAY 270 tablet 1  . furosemide (LASIX) 80 MG tablet Take 1.5 tablets (120 mg  total) by mouth daily. 135 tablet 3  . lansoprazole (PREVACID) 15 MG capsule Take 15 mg by mouth daily as needed (Approx 2 times weekly). gerd    . Polyethyl Glycol-Propyl Glycol (SYSTANE ULTRA OP) Place 1 drop into both eyes daily.    . polyethylene glycol (MIRALAX / GLYCOLAX) packet Take 17 g by mouth daily.    . potassium chloride (K-DUR) 10 MEQ tablet Take 3 tablets (30 mEq total) by mouth daily. 270 tablet 3  . QUEtiapine (SEROQUEL)  25 MG tablet Take 1 tablet (25 mg total) by mouth 2 (two) times daily. 60 tablet 2  . vitamin B-12 (CYANOCOBALAMIN) 1000 MCG tablet Take 1,000 mcg by mouth daily.     No current facility-administered medications for this visit.    Facility-Administered Medications Ordered in Other Visits  Medication Dose Route Frequency Provider Last Rate Last Dose  . DOBUTamine (DOBUTREX) 1,000 mcg/mL in dextrose 5% 250 mL infusion  5-20 mcg/kg/min (Order-Specific) Intravenous Continuous Carlena Bjornstad, MD 40.8 mL/hr at 09/20/14 1415 10 mcg/kg/min at 09/20/14 1415    Allergies  Allergen Reactions  . Plavix [Clopidogrel]     Fatigue   . Pravastatin     Muscle aches  . Zocor [Simvastatin]     REACTION: dizziness  . Penicillins Rash    Has patient had a PCN reaction causing immediate rash, facial/tongue/throat swelling, SOB or lightheadedness with hypotension: YES Has patient had a PCN reaction causing severe rash involving mucus membranes or skin necrosis: NO Has patient had a PCN reaction that required hospitalization NO Has patient had a PCN reaction occurring within the last 10 years: NO If all of the above answers are "NO", then may proceed with Cephalosporin use.    Social History   Social History  . Marital status: Married    Spouse name: N/A  . Number of children: 3  . Years of education: N/A   Occupational History  . retired- [employer]   . part-time sub/courier for schools- now only rarely    Social History Main Topics  . Smoking status: Former Smoker    Packs/day: 1.00    Years: 5.00    Types: Cigarettes    Quit date: 06/06/1963  . Smokeless tobacco: Never Used  . Alcohol use No     Comment: very rare  . Drug use: No  . Sexual activity: No   Other Topics Concern  . Not on file   Social History Narrative   Has living will   Wife, then son [name], hold health care POA   Requests DNR --done   Requests no tube feeds if cognitively unaware     Review of  Systems: General: negative for chills, fever, night sweats or weight changes.  Cardiovascular: negative for chest pain, dyspnea on exertion, edema, orthopnea, palpitations, paroxysmal nocturnal dyspnea or shortness of breath Dermatological: negative for rash Respiratory: negative for cough or wheezing Urologic: negative for hematuria Abdominal: negative for nausea, vomiting, diarrhea, bright red blood per rectum, melena, or hematemesis Neurologic: negative for visual changes, syncope, or dizziness All other systems reviewed and are otherwise negative except as noted above.    Blood pressure (!) 146/70, pulse 61, height 5\' 6"  (1.676 m), weight 146 lb 9.6 oz (66.5 kg), SpO2 95 %.  General appearance: alert and no distress Neck: no adenopathy, no carotid bruit, no JVD, supple, symmetrical, trachea midline and thyroid not enlarged, symmetric, no tenderness/mass/nodules Lungs: clear to auscultation bilaterally Heart: regular rate and rhythm, S1, S2  normal, no murmur, click, rub or gallop Extremities: extremities normal, atraumatic, no cyanosis or edema  EKG not performed today  ASSESSMENT AND PLAN:   Coronary artery disease without angina pectoris History of CAD status post coronary artery bypass grafting in 2001 with a LIMA to LAD. He did have a bare metal stent stent placed to the circumflex by Dr. Timmothy Sours G8/18/09. I performed cardiac catheterization on him 06/20/14 revealing a patent LIMA to the LAD, 40-50% ostial smooth nondominant circumflex with moderate irregularities in the dominant RCA. Medical therapy was recommended. He denies chest pain.  Aortic valve stenosis, acquired -- s/p AVR with Porcie Bioprosthetic AVR; By Echo in 05/2009 moderate stenosis,AVA 0.98cm. History of porcine aVR at the time of bypass grafting in 2001. His most recent 2-D echo performed 06/11/16 revealed a valve area 0.77 cm with a peak gradient of 36 mmHg. We will continue to check his annual basis.  Renal artery  stenosis (HCC) History of right renal artery stenosis demonstrated angiographically with last renal duplex performed 09/27/14 revealing a right renal aortic ratio 5.9. At this point, I do not see a reason to continue to follow this.  Heart block AV second degree -- 2:1 History of permanent chest there is pacemaker insertion 01/21/12 her symptomatically second degree heart block by Dr. Sallyanne Kuster. He also had a biopsy ICD placed in July last year by Dr. Rayann Heman resulting in marked improvement in his ejection fraction from 20% up to 60%.  Hyperlipidemia History of hyperlipidemia not on statin therapy followed by his PCP  Essential hypertension History of essential hypertension blood pressure measures 120/64. He is on Zebeta. Continue current meds at current dosing  Chronic combined systolic and diastolic heart failure (HCC) History of combined systolic and diastolic heart failure with marked improvement in his EF by recent conization therapy. He is on high-dose furosemide for lower extremity edema and orthopnea.      Lorretta Harp MD FACP,FACC,FAHA, [facility] 09/14/2016 4:14 PM

## 2016-09-14 NOTE — Assessment & Plan Note (Signed)
History of right renal artery stenosis demonstrated angiographically with last renal duplex performed 09/27/14 revealing a right renal aortic ratio 5.9. At this point, I do not see a reason to continue to follow this.

## 2016-09-14 NOTE — Patient Instructions (Signed)
Medication Instructions: Your physician recommends that you continue on your current medications as directed. Please refer to the Current Medication list given to you today.  Labwork: Your physician recommends that you return for lab work: CBC and BMET   Testing/Procedures: Your physician has requested that you have an echocardiogram in 1 year. Echocardiography is a painless test that uses sound waves to create images of your heart. It provides your doctor with information about the size and shape of your heart and how well your heart's chambers and valves are working. This procedure takes approximately one hour. There are no restrictions for this procedure.  Follow-Up: Your physician wants you to follow-up in: 1 year with Dr. Gwenlyn Found after echo is completed. You will receive a reminder letter in the mail two months in advance. If you don't receive a letter, please call our office to schedule the follow-up appointment.  If you need a refill on your cardiac medications before your next appointment, please call your pharmacy.

## 2016-09-14 NOTE — Assessment & Plan Note (Signed)
History of permanent chest there is pacemaker insertion 01/21/12 her symptomatically second degree heart block by Dr. Sallyanne Kuster. He also had a biopsy ICD placed in July last year by Dr. Rayann Heman resulting in marked improvement in his ejection fraction from 20% up to 60%.

## 2016-09-14 NOTE — Assessment & Plan Note (Signed)
History of hyperlipidemia not on statin therapy followed by his PCP 

## 2016-09-14 NOTE — Assessment & Plan Note (Signed)
History of combined systolic and diastolic heart failure with marked improvement in his EF by recent conization therapy. He is on high-dose furosemide for lower extremity edema and orthopnea.

## 2016-09-15 LAB — CBC WITH DIFFERENTIAL/PLATELET
BASOS ABS: 0.1 10*3/uL (ref 0.0–0.2)
Basos: 1 %
EOS (ABSOLUTE): 0.2 10*3/uL (ref 0.0–0.4)
Eos: 2 %
HEMOGLOBIN: 13.3 g/dL (ref 13.0–17.7)
Hematocrit: 38.9 % (ref 37.5–51.0)
Immature Grans (Abs): 0 10*3/uL (ref 0.0–0.1)
Immature Granulocytes: 0 %
LYMPHS ABS: 2.2 10*3/uL (ref 0.7–3.1)
Lymphs: 27 %
MCH: 31.5 pg (ref 26.6–33.0)
MCHC: 34.2 g/dL (ref 31.5–35.7)
MCV: 92 fL (ref 79–97)
MONOCYTES: 8 %
Monocytes Absolute: 0.7 10*3/uL (ref 0.1–0.9)
NEUTROS ABS: 4.9 10*3/uL (ref 1.4–7.0)
Neutrophils: 62 %
Platelets: 371 10*3/uL (ref 150–379)
RBC: 4.22 x10E6/uL (ref 4.14–5.80)
RDW: 13.9 % (ref 12.3–15.4)
WBC: 8 10*3/uL (ref 3.4–10.8)

## 2016-09-15 LAB — BASIC METABOLIC PANEL
BUN / CREAT RATIO: 21 (ref 10–24)
BUN: 18 mg/dL (ref 8–27)
CHLORIDE: 96 mmol/L (ref 96–106)
CO2: 31 mmol/L — AB (ref 18–29)
CREATININE: 0.84 mg/dL (ref 0.76–1.27)
Calcium: 9.8 mg/dL (ref 8.6–10.2)
GFR calc Af Amer: 92 mL/min/{1.73_m2} (ref >59)
GFR calc non Af Amer: 80 mL/min/{1.73_m2} (ref >59)
GLUCOSE: 130 mg/dL — AB (ref 65–99)
POTASSIUM: 4.8 mmol/L (ref 3.5–5.2)
SODIUM: 142 mmol/L (ref 134–144)

## 2016-09-16 ENCOUNTER — Ambulatory Visit (INDEPENDENT_AMBULATORY_CARE_PROVIDER_SITE_OTHER): Payer: Medicare Other

## 2016-09-16 DIAGNOSIS — Z950 Presence of cardiac pacemaker: Secondary | ICD-10-CM

## 2016-09-16 DIAGNOSIS — I5022 Chronic systolic (congestive) heart failure: Secondary | ICD-10-CM | POA: Diagnosis not present

## 2016-09-17 ENCOUNTER — Telehealth: Payer: Self-pay

## 2016-09-17 NOTE — Progress Notes (Signed)
EPIC Encounter for ICM Monitoring  Patient Name: Ian [name] is a 83 y.o. male Date: 09/17/2016 Primary Care Physican: Venia Carbon, MD Primary Cardiologist:Berry Electrophysiologist: Allred Dry Weight:Last known ICM [id number] lbs Bi-V Pacing: >99%                                                  Attempted call to wife and unable to reach.  Left message to return call.    Thoracic impedance normal but was abnormal suggesting fluid accumulation from 09/05/2016 to 09/10/2016.  Prescribed and confirmed dosage: Furosemide 80 mg 1.5 tablet (120 mg total) by mouth daily. Potassium 10 mEq 3 tablets by mouth daily.   Labs: 09/14/2016 Creatinine 0.84, BUN 18, Potassium 4.8, Sodium 142, EGFR 80-92 11/27/2017Creatinine 0.75, BUN 12, Potassium 4.3, Sodium 136 01/09/2016 Creatinine 0.67, BUN 15, Potassium 4.0, Sodium 135, EGFR >60  01/07/2016 Creatinine 0.79, BUN 21, Potassium 4.2, Sodium 139 01/06/2016 Creatinine 0.82, BUN 19, Potassium 4.0, Sodium 139 12/18/2015 Creatinine 0.94, BUN 14, Potassium 4.5, Sodium 142 10/23/2015 Creatinine 0.89, BUN 17, Potassium 4.3, Sodium 139 09/23/2015 Creatinine 0.73, BUN 14, Potassium 5.2, Sodium 138  Recommendations: NONE - Unable to reach patient   Follow-up plan: ICM clinic phone appointment on 10/22/2016.   Copy of ICM check sent to device physician.   3 month ICM trend: 09/16/2016   1 Year ICM trend:      Rosalene Billings, RN 09/17/2016 9:40 AM

## 2016-09-17 NOTE — Telephone Encounter (Signed)
Remote ICM transmission received.  Attempted patient call and left message to return call.   

## 2016-09-22 DIAGNOSIS — H35372 Puckering of macula, left eye: Secondary | ICD-10-CM | POA: Diagnosis not present

## 2016-09-22 DIAGNOSIS — H4423 Degenerative myopia, bilateral: Secondary | ICD-10-CM | POA: Diagnosis not present

## 2016-09-22 DIAGNOSIS — H353131 Nonexudative age-related macular degeneration, bilateral, early dry stage: Secondary | ICD-10-CM | POA: Diagnosis not present

## 2016-09-22 DIAGNOSIS — H35362 Drusen (degenerative) of macula, left eye: Secondary | ICD-10-CM | POA: Diagnosis not present

## 2016-09-23 ENCOUNTER — Ambulatory Visit (INDEPENDENT_AMBULATORY_CARE_PROVIDER_SITE_OTHER): Payer: Medicare Other

## 2016-09-23 DIAGNOSIS — Z950 Presence of cardiac pacemaker: Secondary | ICD-10-CM

## 2016-09-23 DIAGNOSIS — I5022 Chronic systolic (congestive) heart failure: Secondary | ICD-10-CM

## 2016-09-23 NOTE — Progress Notes (Signed)
EPIC Encounter for ICM Monitoring  Patient Name: Ian [name] is a 83 y.o. male Date: 09/23/2016 Primary Care Physican: Venia Carbon, MD Primary Cardiologist: Gwenlyn Found Electrophysiologist: Croitoru Dry Weight:Last known ICM weight 135 lbs Bi-V Pacing: >99%      Returned call to wife as requested by voice mail.  She reported patient is symptomatic with a feeling of chest fullness and shortness of breath since 09/22/2016.  Feet are chronically swollen but no worse, in last few days.    Thoracic impedance not available  Prescribed and confirmed dosage: Furosemide 80 mg 1.5 tablet (120 mg total) by mouth daily. Potassium 10 mEq 3 tablets by mouth daily.   Labs: 09/14/2016 Creatinine 0.84, BUN 18, Potassium 4.8, Sodium 142, EGFR 80-92 11/27/2017Creatinine 0.75, BUN 12, Potassium 4.3, Sodium 136 01/09/2016 Creatinine 0.67, BUN 15, Potassium 4.0, Sodium 135, EGFR >60  01/07/2016 Creatinine 0.79, BUN 21, Potassium 4.2, Sodium 139 01/06/2016 Creatinine 0.82, BUN 19, Potassium 4.0, Sodium 139 12/18/2015 Creatinine 0.94, BUN 14, Potassium 4.5, Sodium 142 10/23/2015 Creatinine 0.89, BUN 17, Potassium 4.3, Sodium 139 09/23/2015 Creatinine 0.73, BUN 14, Potassium 5.2, Sodium 138  Recommendations:  Advised to increase Furosemide to 120 mg AM and 40 mg PM x 2 days and then return to 120 mg every AM.    Follow-up plan: ICM clinic phone appointment on 10/22/2016.    Copy of ICM check sent to primary cardiologist and device physician.   Unable to send remote transmission due to monitor not working properly.    Rosalene Billings, RN 09/23/2016 8:25 AM

## 2016-09-28 ENCOUNTER — Telehealth: Payer: Self-pay | Admitting: Neurology

## 2016-09-28 NOTE — Telephone Encounter (Signed)
A male left a message on the voicemail and did not leave a name of who she was. Wanted to speak Ian [name]  Call [phone]

## 2016-09-28 NOTE — Telephone Encounter (Signed)
Spoke with patient's wife and she states patient having episodes of intense tremors and wanted to know if safe to take an extra levodopa a day. Patient is currently taking Carbidopa Levodopa 50/200 - 1 five times daily and Carbidopa Levodopa 25/100 - 1 3 times daily. I spoke with Dr. Carles Collet who advised okay rarely to give an extra Levodopa during the day, but to stop if makes hallucinations worse.   After further discussion, patient's wife states there isn't a pattern as far as what time of day this happens, but does always occur about 30 minutes before his dose of Levodopa is due. She is giving medication every 3 - 3.5 hours. I advised she could try to move medications to more of the every 3 hour mark to see if this helps before adding additional medication. She will try this first.   She also states that patient stopped Seroquel. It never really helped hallucinations, but was helping sleep according to wife. Patient started complaining daily about throbbing in arms and legs so they stopped Seroquel and pain stopped. He is still sleeping okay for now.   They will call back with any further problems or questions.

## 2016-10-04 ENCOUNTER — Telehealth: Payer: Self-pay | Admitting: Neurology

## 2016-10-04 MED ORDER — DIVALPROEX SODIUM ER 250 MG PO TB24: 250.0000 mg | ORAL_TABLET | Freq: Every day | ORAL | 1 refills | Status: DC

## 2016-10-04 NOTE — Telephone Encounter (Signed)
Patient wife called and states that is depressed and would like to know if we would send in a mild antidepressants

## 2016-10-04 NOTE — Telephone Encounter (Signed)
Can try depakote ER 250 mg 1 po q day.  Not really an antidepressant but a mood stabilizer.

## 2016-10-04 NOTE — Telephone Encounter (Signed)
Spoke with patient's wife. She states he has been very emotional lately. Not pseudobulbar type symptoms, but very emotional. No SI/HI. She is asking about antidepressant. I let her know Dr. Carles Collet may defer to PCP but I would ask.  Dr. Carles Collet please advise.

## 2016-10-04 NOTE — Telephone Encounter (Signed)
Spoke with wife. She agrees with this plan. RX sent to pharmacy.

## 2016-10-13 ENCOUNTER — Other Ambulatory Visit: Payer: Self-pay

## 2016-10-13 NOTE — Patient Outreach (Signed)
[place] [facility]) Care Management  10/13/2016  TROY KANOUSE 1928-09-19 [id number]   Telephone Screen  Referral Date: 10/11/16 Referral Source: EMMI Prevent Call Referral Reason: "irregular heart rhythm, heart disease, Parkinson's disease" Insurance: [facility] Medicare   Outreach attempt # 1 to patient. Spoke with patient who also gave verbal consent to have spouse on Speakerphone to assist with screening call as he is HOH.  Social: Patient resides in his home with supportive spouse. He requires assistance with all ADLs/IADLs. Spouse drives patient to medical appts as patient stopped driving when he was diagnosed with Parkinson's diease. Spouse reports multiple falls in the home within the past year. She states at least two falls within the past three months. Patient did sustain a "bad fall" last Sept which resulted in him having a broken hip.DME in the home include cane, walker(rarely using) and scale.   Conditions:Patient has PMH of Parkinson's disease with tremors, CHF, HLD, second degree heart block s/p pacer(2017), renal artery stenosis and CAD. Spouse states that patient has issues with fluid retention and swelling "regularly" which often results in him taking extra dose of diuretic. She also states that patient gets SOB often especially at night. They have a scale in the home and are monitoring weight. Weight this am was 143 lbs. Patient also having issues with "pain in his feet" of which he had gotten several injections which ease the pain short term. He denies any pain during this call. Spouse states that patient also has vision and hearing problems.   Medications:Spouse reports that patient takes about 17-18 pills per day. She is assisting patient with managing his meds. She voices that patient "is not happy about taking all these meds."  Appointments: He goes to see PCP on 10/26/16. He is also followed by Dr. Lenore Manner), Dr. Gwenlyn Found, Dr. Renaee Munda and Dr. Allred(cardiologists),  Dr. Regal(podiatrist) and Dr. Rankin(ophthalmologist).   Advance Directives: Patient has living will and DNR in the home.   Consent: [facility] services reviewed and discussed with patient and spouse. They both gave verbal consent for services.    Plan: RN CM will notify [facility] administrative assistant of case status. RN CM will send referral to [facility] RN for further in home eval/assessment of care needs and management of chronic conditions.RN CM will send [facility] pharmacy referral for polypharmacy med review.   Enzo Montgomery, RN,BSN,CCM [place] Management Telephonic Care Management Coordinator Direct Phone: [phone] Toll Free: [phone] Fax: [phone]

## 2016-10-18 ENCOUNTER — Other Ambulatory Visit: Payer: Self-pay | Admitting: *Deleted

## 2016-10-18 ENCOUNTER — Encounter: Payer: Self-pay | Admitting: *Deleted

## 2016-10-18 NOTE — Patient Outreach (Signed)
[place] [facility]) Care Management [place] Telephone Outreach  10/18/2016  Ian [name] [birth year] [id number]   Unsuccessful telephone outreach to Ian [name], 83 y/o male referred to [place] from [facility] telephonic RN CM for evaluation and assessment of self-health management needs for chronic disease conditions and multiple falls.  Patient has history including, but not limited to, Parkinson's disease, CAD with pacemaker placement in 2017, AV stenosis (with AVR in 2011), bradycardia, combined CHF, and GERD.  HIPAA compliant voice mail message left for patient, requesting return call back.  Plan:  Will re-attempt [name] telephone outreach later this week if I do not hear back from patient first.  Oneta Rack, RN, BSN, [place] Coordinator [facility] Care Management  [phone]

## 2016-10-18 NOTE — Patient Outreach (Signed)
[name]) Care Management [place] Telephone Outreach  10/18/2016  Ian [name] Jul 26, 1928 [id number]  Successful incoming telephone outreach from Ian [name], 83 y/o male referred to [place] from [facility] telephonic RN CM for evaluation and assessment of self-health management needs for chronic disease conditions and multiple falls.  Patient has history including, but not limited to, Parkinson's disease, CAD with pacemaker placement in 2017, AV stenosis (with AVR in 2011), bradycardia, combined CHF, and GERD.  HIPAA/ identity verified with patient during phone call today.    Patient returned call from earlier today; wife was present during phone call while phone was on speaker mode, and patient provided verbal consent for me to speak with his wife  Ian [name] at any point if necessary.  Wife actively participated in phone call today.  Discussed [facility] CM services with patient and his wife and verbal consent for [name] CM involvement in patient's care was obtained.  Today, patient reports that he is "doing pretty good," stating that he and his wife "just returned home from" [name]  Patient denies pain today, and sounds to be in no obvious distress throughout today's phone call.  Medications: -- reports has all medicationsand takes as prescribed;denies questions about current medications.  -- reports wife manages all of his medications using weekly pill planner box, as he "cannot keep up" with all the medicines he is currently taking -- wife verbalizes good general understanding of the purpose, dosing, and scheduling of medications, and also denies questions about current medications.    Provider appointments: -- patient and wife report accurate understanding of upcoming scheduled provider appointments as compared to review of EMR -- PCP appointment scheduled for October 26, 2016; "foot doctor" scheduled on October 27, 2016,  neurologist appointment scheduled November 04, 2016 -- patient/ wife verbalize intent to attend all scheduled provider appointments -- wife will drive patient to all appointments, as patient "has not driven in years."  Safety/ Mobility/ Falls: -- patient/ wife report multiple episodes of falling; state last fall "was last week," stating that patient "slid off bed."   -- denies recent injury from fall episodes; reports "only one bad fall last September," where patient "broke his femur" and had to undergo hip replacement surgery -- patient currently uses cane whenever he "goes out of the house;" otherwise reports that he does not need to use cane or other assistive device. -- patient and wife report that patient is "independant" for ADL's except for showering; wife assists patient with showering/ bathing, primarily in an effort to prevent falls -- general fall risks/ prevention education discussed with patient and his wife today  Holiday representative needs: -- currently both patient and wife deny need for community resources, stating that they are able to "provide" for themselves -- have local son who is also supportive and actively involved in patient's care as needed  Advanced Directive (AD) planning: -- Patient reports that he has HCPOA, living will, and DNR form; denies desire to make changes to any today  Self-health management of chronic disease state of Parkinson's disease and CHF/ medication management: -- currently have no definitive action plan for parkinson's disease; states that patient's disease is "progressing," and they primarily attend regular neurology provider appointments and stay in contact with neurologist when needed for new concerns -- monitors and records weights and BP's "several times" every week; reports weight ranges from "low 140's to mid 140's."  Reports last weight over weekend at "  147 lbs" -- discussed weight gain guidelines for self-health management of CHF,  including guidelines to contact PCP/ cardiologist for weight gain > 3 lbs overnight/ 5 lbs in one week; patient and wife report they have never been told to contact providers for weight gain. -- BP ranges over last week reported as :  147-160/ 57-60 -- reports eating "regular diet" -- discussed medications with wife/ patient, and shared that [name] Pharmacist would also be contacting them by phone for focused assessment of medication needs  Patient and his wife deny further issues, concerns, or problems today.  I provided patient/ wife with my direct phone number, the main [name] CM office phone number, and the [name] CM 24-hour nurse advice phone number should issues arise prior to next scheduled [place] outreach, and we scheduled initial [facility] RN CM in-home visit for next week.  Plan:  Patient will continue taking his medications as prescribed and will attend all scheduled provider appointments  Patient will continue monitoring and recording his weights and BP's several times every week  Patient will communicate questions/ concerns with his medications to [name] Pharmacist  Patient will continue using assistive devices for fall prevention as indicated  I will make patient's PCP aware of [facility] CM involvement in patient's care  [name] CM outreach for evaluation of self-health management of chronic disease states to continue with scheduled [facility] RN CM initial home visit next week.  Oneta Rack, RN, BSN, [employer] [name] Care Management  [phone]

## 2016-10-19 ENCOUNTER — Encounter: Payer: Self-pay | Admitting: *Deleted

## 2016-10-19 ENCOUNTER — Telehealth: Payer: Self-pay | Admitting: Pharmacist

## 2016-10-19 NOTE — Patient Outreach (Signed)
[facility]) Care Management  10/19/2016  REVIS WHALIN 05-04-1928 [id number]   Successful telephone encounter with Ian [name], 83 year old male, referred to [place] by [facility] CM RN Enzo Montgomery for medication review and polypharmacy.  Past medical history includes, but not limited to, CAD, aortic valve stenosis s/p AVR, heart failure, HTN, Parkinson's Disease, GERD, and vitamin B12 deficiency.   Patient and wife present during phone call, and patient gave verbal consent to discuss health care information with his wife.  Patient stated that he was "not happy" about taking all of his medications.   His wife organizes his medications using a weekly pillbox for his "daily" medications and a separate pillbox for his Sinemet as this is five times daily.  They report this system works well for them.  They also state they do not need any medication cost assistance at this time.    Medication reconciliation performed telephonically with patient and wife.   Drugs sorted by system:  Neurologic/Psychologic: carbidopa-levodopa IR and CR, divalproex ER   Cardiovascular: aspirin, bisoprolol, furosemide  Gastrointestinal: lansoprazole, polyethylene glycol  Topical: polyethyl glycol-propyl glycol eye drops  Vitamins/Minerals: potassium, cyanocobalamin  Other issues noted:   Updated furosemide dose to 80mg  daily with 1/2 tablet extra (total 120mg ) PRN fluid   Updated potassium dose to 20 mEq daily with 1/2 tablet extra (total 25 mEq) PRN fluid  Reviewed checking daily weights for heart failure with patient.    Patient has concerns regarding cost of his current exercise program through [facility] in [place], [place].  He currently pays $30 for 3 months but this does not include special programs or swimming.  Patient does not think his insurance covers silver sneakers.  I suggested that patient call his insurance to see if there is a wellness benefit included.  I also  reached out to [place].  She will bring any applicable resources to patient at scheduled home visit next week.    I discussed with patient and wife that all medications had appropriate indication.  I did not find any duplicate medications.  Relayed that patient will need to discuss risk vs benefit of stopping any medication with his provider.  Patient verbalized understanding.  Patient denies any further medication questions or needs at this time.    Plan: [place] will close case at this time but am happy to assist in the future as needed.    Ian [name], PharmD, [place] [phone]

## 2016-10-22 ENCOUNTER — Ambulatory Visit: Payer: Self-pay | Admitting: *Deleted

## 2016-10-22 ENCOUNTER — Ambulatory Visit (INDEPENDENT_AMBULATORY_CARE_PROVIDER_SITE_OTHER): Payer: Medicare Other | Admitting: *Deleted

## 2016-10-22 ENCOUNTER — Telehealth: Payer: Self-pay | Admitting: Cardiology

## 2016-10-22 DIAGNOSIS — I5022 Chronic systolic (congestive) heart failure: Secondary | ICD-10-CM

## 2016-10-22 DIAGNOSIS — I442 Atrioventricular block, complete: Secondary | ICD-10-CM | POA: Diagnosis not present

## 2016-10-22 DIAGNOSIS — Z950 Presence of cardiac pacemaker: Secondary | ICD-10-CM | POA: Diagnosis not present

## 2016-10-22 NOTE — Telephone Encounter (Signed)
Spoke with pt and reminded pt of remote transmission that is due today. Pt verbalized understanding.   

## 2016-10-22 NOTE — Progress Notes (Signed)
EPIC Encounter for ICM Monitoring  Patient Name: Ian [name] is a 83 y.o. male Date: 10/22/2016 Primary Care Physican: Venia Carbon, MD Primary Cardiologist: Gwenlyn Found Electrophysiologist: Croitoru Dry Weight:146 lbs  Bi-V Pacing: >99%        Spoke with wife.  Heart Failure questions reviewed, pt has had some leg swelling, shortness of breath and weight gain intermittently in the last month, at times of decreased impedance.  He has been taking extra .5 tablet of Furosemide when needed which resolves symptoms.    Thoracic impedance normal but has been abnormal suggesting fluid accumulation from 10/07/2016 to 10/13/2016 and 10/14/2016 to 10/18/2016.  Prescribed dosage: Furosemide 80 mg 1 tablet daily and adds .5tablet if need. Potassium 10 mEq 2tablets by mouth daily and adds 1 tablet if additional Furosemide is taken.   Labs: 09/14/2016 Creatinine 0.84, BUN 18, Potassium 4.8, Sodium 142, EGFR 80-92 11/27/2017Creatinine 0.75, BUN 12, Potassium 4.3, Sodium 136 01/09/2016 Creatinine 0.67, BUN 15, Potassium 4.0, Sodium 135, EGFR >60  01/07/2016 Creatinine 0.79, BUN 21, Potassium 4.2, Sodium 139 01/06/2016 Creatinine 0.82, BUN 19, Potassium 4.0, Sodium 139 12/18/2015 Creatinine 0.94, BUN 14, Potassium 4.5, Sodium 142 10/23/2015 Creatinine 0.89, BUN 17, Potassium 4.3, Sodium 139 09/23/2015 Creatinine 0.73, BUN 14, Potassium 5.2, Sodium 138  Recommendations:  No changes. Wife is concerned about low blood pressures which she thinks is causing some dizziness and he is unsteady on his feet.  She asked if patient could take the Furosemide and Zebeta at different times since they both would drop his BP.  Advised to discuss concerns with Dr Silvio Pate at appointment on 10/26/2016 or she can make an appointment with cardiologist/PA if needed to address the concerns.  Also advised her she has a Armed forces training and education officer person that she spoke with on 10/19/2016 and she may be able to answer questions about  changing the time of day of when he takes those medications.   Follow-up plan: ICM clinic phone appointment on 11/22/2016.    Copy of ICM check sent to primary cardiologist and device physician.   3 month ICM trend: 10/22/2016   1 Year ICM trend:      Rosalene Billings, RN 10/22/2016 10:20 AM

## 2016-10-26 ENCOUNTER — Encounter: Payer: Self-pay | Admitting: Internal Medicine

## 2016-10-26 ENCOUNTER — Ambulatory Visit (INDEPENDENT_AMBULATORY_CARE_PROVIDER_SITE_OTHER): Payer: Medicare Other | Admitting: Internal Medicine

## 2016-10-26 VITALS — BP 118/82 | HR 61 | Temp 97.5°F | Ht 65.5 in | Wt 145.0 lb

## 2016-10-26 DIAGNOSIS — I701 Atherosclerosis of renal artery: Secondary | ICD-10-CM

## 2016-10-26 DIAGNOSIS — Z7189 Other specified counseling: Secondary | ICD-10-CM

## 2016-10-26 DIAGNOSIS — G20A1 Parkinson's disease without dyskinesia, without mention of fluctuations: Secondary | ICD-10-CM

## 2016-10-26 DIAGNOSIS — I5042 Chronic combined systolic (congestive) and diastolic (congestive) heart failure: Secondary | ICD-10-CM

## 2016-10-26 DIAGNOSIS — G20 Parkinson's disease: Secondary | ICD-10-CM

## 2016-10-26 DIAGNOSIS — Z0000 Encounter for general adult medical examination without abnormal findings: Secondary | ICD-10-CM

## 2016-10-26 DIAGNOSIS — I25119 Atherosclerotic heart disease of native coronary artery with unspecified angina pectoris: Secondary | ICD-10-CM

## 2016-10-26 DIAGNOSIS — I4729 Other ventricular tachycardia: Secondary | ICD-10-CM

## 2016-10-26 DIAGNOSIS — I472 Ventricular tachycardia: Secondary | ICD-10-CM | POA: Diagnosis not present

## 2016-10-26 DIAGNOSIS — F39 Unspecified mood [affective] disorder: Secondary | ICD-10-CM | POA: Diagnosis not present

## 2016-10-26 NOTE — Assessment & Plan Note (Signed)
Depressed at times, labile Some perceptual problems/hallucinations Currently on low dose depakote as mood stabilizer

## 2016-10-26 NOTE — Assessment & Plan Note (Signed)
No apparent recurrence noted on his monitor Is on beta blocker

## 2016-10-26 NOTE — Assessment & Plan Note (Signed)
Progressing with functional decline Continues the Dr Tat but is the main issue with him

## 2016-10-26 NOTE — Assessment & Plan Note (Signed)
I have personally reviewed the Medicare Annual Wellness questionnaire and have noted 1. The patient's medical and social history 2. Their use of alcohol, tobacco or illicit drugs 3. Their current medications and supplements 4. The patient's functional ability including ADL's, fall risks, home safety risks and hearing or visual             impairment. 5. Diet and physical activities 6. Evidence for depression or mood disorders  The patients weight, height, BMI and visual acuity have been recorded in the chart I have made referrals, counseling and provided education to the patient based review of the above and I have provided the pt with a written personalized care plan for preventive services.  I have provided you with a copy of your personalized plan for preventive services. Please take the time to review along with your updated medication list.  UTD on vaccines No cancer screening due to age 83 to do exercise as tolerated

## 2016-10-26 NOTE — Progress Notes (Signed)
Subjective:    Patient ID: Ian [name], male    DOB: 01-Jan-1929, 83 y.o.   MRN: [id number]  HPI Here for Medicare wellness visit and follow up of chronic health conditions With wife Reviewed form and advanced directives Reviewed other doctors--list on form No alcohol or tobacco Has had several falls and the broken hip in the past year Chronic mood issues now Poor vision --- retina procedure not really helpful. Can't really read--can see TV Hearing is poor Some memory problems-- mostly recent things  Has noticed trouble voiding Stream is weak---stops and then starts again No dysuria or fever Does seem to empty bladder eventually Nocturia but also gets up for sinemet dosing also  Parkinson's continues to advance More tremor but not really freezing Very unsteady Helps with housework but pace is slower Has stand by help in shower Wife helps him dress---like to do buttons Eats unassisted--wife does the food prep (wife has to cut meat and cut up salads, etc) Keeps up with Dr Tat  Recently saw Dr Gwenlyn Found for cardiology Did have revision of pacer in past year Recent monitor check showed some fluid issues Furosemide increased by 1/2 tab (and potassium)-- if fluid increased (he can tell) Starting weighing daily now No chest pain per se--- but gets tightness at times (with exertion and better with rest) Tires out easier  Has been depressed at times More common but still not daily Not anhedonic but not able to get out much Thinks about dying but no suicidal ideation On depakote for this as mood stabilizer seroquel helped mood but increased the hallucinations--so this was stopped  Heartburn hasn't been much of a problem Uses prevacid only prn Swallows okay  Current Outpatient Prescriptions on File Prior to Visit  Medication Sig Dispense Refill  . acetaminophen (TYLENOL) 500 MG tablet Take 500 mg by mouth every 6 (six) hours as needed for mild pain.    Marland Kitchen aspirin 81 MG  tablet Take 81 mg by mouth daily.    . carbidopa-levodopa (SINEMET CR) 50-200 MG tablet TAKE 1 TABLET 5 TIMES DAILY 450 tablet 1  . carbidopa-levodopa (SINEMET IR) 25-100 MG tablet TAKE 1 TABLET 3 TIMES A DAY 270 tablet 1  . divalproex (DEPAKOTE ER) 250 MG 24 hr tablet Take 1 tablet (250 mg total) by mouth daily. 30 tablet 1  . furosemide (LASIX) 80 MG tablet Take 80 mg by mouth daily.    . lansoprazole (PREVACID) 15 MG capsule Take 15 mg by mouth daily as needed (Approx 2 times weekly). gerd    . Multiple Vitamins-Minerals (PRESERVISION AREDS PO) Take 1 tablet by mouth daily.    Vladimir Faster Glycol-Propyl Glycol (SYSTANE ULTRA OP) Place 1 drop into both eyes daily.    . polyethylene glycol (MIRALAX / GLYCOLAX) packet Take 17 g by mouth daily.    . potassium chloride (K-DUR) 10 MEQ tablet Take 20 mEq by mouth daily.    . vitamin B-12 (CYANOCOBALAMIN) 1000 MCG tablet Take 1,000 mcg by mouth daily.     No current facility-administered medications on file prior to visit.     Allergies  Allergen Reactions  . Plavix [Clopidogrel]     Fatigue   . Pravastatin     Muscle aches  . Zocor [Simvastatin]     REACTION: dizziness  . Penicillins Rash    Has patient had a PCN reaction causing immediate rash, facial/tongue/throat swelling, SOB or lightheadedness with hypotension: YES Has patient had a PCN reaction causing severe rash involving  mucus membranes or skin necrosis: NO Has patient had a PCN reaction that required hospitalization NO Has patient had a PCN reaction occurring within the last 10 years: NO If all of the above answers are "NO", then may proceed with Cephalosporin use.    Past Medical History:  Diagnosis Date  . Allergy   . Aortic valve stenosis, acquired    a. s/p Porcine AVR 2001;  b. 09/2014 [place] Echo: mod-sev bioprosthetic AS w/ minimal change in CO and only mild increase in gradients w/ dobutamine.   Marland Kitchen CAD (coronary artery disease)    a. s/p CABG with LIMA-LAD in 2001 with  AVR. b. BMS to LCx 8/08. c. Abnl nuc/dec EF 05/2014 - s/p cath with patent LIMA to LAD, patent dominant right, mild LCx disease, patent stent. Med rx.  . Chronic combined systolic (congestive) and diastolic (congestive) heart failure (HCC)    a. EF 50-55% in 02/2013;  b. 05/2014 Echo: EF 20-25%, Gr1 DD.  Marland Kitchen Diverticulitis   . ED (erectile dysfunction)   . Essential hypertension   . GERD (gastroesophageal reflux disease)   . Hyperlipidemia   . MALT lymphoma ([place])   . Nonischemic cardiomyopathy ([place])    a. 05/2014 Echo: EF 20-25%-->f/u cath w/ nonobs dzs.  . Osteoarthritis   . Osteoporosis   . Parkinson's disease   . Renal artery stenosis (HCC)    a. 90% RRA stenosis previously. b. Duplex 09/2014: R renal artery 60-99%, L renal artery 1-59%.  . S/P cardiac pacemaker procedure, Medtronic Adapta L  ADDRL1, 01/21/12    a. 01/2012 s/p MDT [place] PPM (ser # [id number]).  . Symptomatic bradycardia    a. 01/2012 s/p MDT [place] PPM (ser # [id number]).  Marland Kitchen TIA (transient ischemic attack) 06/08   a. 09/2006    Past Surgical History:  Procedure Laterality Date  . AORTIC VALVE REPLACEMENT  2001   Porcine valve  . CARDIAC CATHETERIZATION  06/20/2014   Procedure: CORONARY/GRAFT ANGIOGRAPHY;  Surgeon: Lorretta Harp, MD;  Location: [facility] CATH LAB;  Service: Cardiovascular;;  . CATARACT EXTRACTION  06/09   left  . CORONARY ARTERY BYPASS GRAFT  2001   ( van trigt ) aortic valve replacement 2001; LIMA-LAD.  Marland Kitchen CORONARY STENT PLACEMENT  11/2007   8/09  Left Circumflex Stent by Dr Toy Care started and aggrenox stopped  . EP IMPLANTABLE DEVICE N/A 10/30/2015   Procedure: BiV Pacemaker Upgrade;  Surgeon: Thompson Grayer, MD;  Location: [facility] CV LAB;  Service: Cardiovascular;  Laterality: N/A;  . ESOPHAGOGASTRODUODENOSCOPY     multiple, Colon/ EGD benign 11/2004  . EYE SURGERY  06/18/15  . EYE SURGERY  04/2016  . INGUINAL HERNIA REPAIR     LIH 1997Llap. bilateral hernias 1998  .  LAPAROSCOPIC CHOLECYSTECTOMY  07/11   Dr.Rosenbower  . NM MYOCAR PERF WALL MOTION  11/22/2007   mild septal ischemia,EF 66%  . PACEMAKER INSERTION  01/21/2012   Medtronic Adapta L implanted for complete heart block  . PERMANENT PACEMAKER INSERTION N/A 01/21/2012   Procedure: PERMANENT PACEMAKER INSERTION;  Surgeon: Thompson Grayer, MD;  Location: [facility] CATH LAB;  Service: Cardiovascular;  Laterality: N/A;  . PILONIDAL CYST / SINUS EXCISION  1953  . SQUAMOUS CELL CARCINOMA EXCISION  3/13   back  . TOTAL HIP ARTHROPLASTY Left 01/06/2016   Procedure: TOTAL HIP ARTHROPLASTY ANTERIOR APPROACH;  Surgeon: Hessie Knows, MD;  Location: [facility] ORS;  Service: Orthopedics;  Laterality: Left;    Family History  Problem Relation  Age of Onset  . Asthma Father   . Heart disease Brother     Social History   Social History  . Marital status: Married    Spouse name: N/A  . Number of children: 3  . Years of education: N/A   Occupational History  . retired- [employer]   .      Social History Main Topics  . Smoking status: Former Smoker    Packs/day: 1.00    Years: 5.00    Types: Cigarettes    Quit date: 06/06/1963  . Smokeless tobacco: Never Used  . Alcohol use No     Comment: very rare  . Drug use: No  . Sexual activity: No   Other Topics Concern  . Not on file   Social History Narrative   Has living will   Wife, then son [name], hold health care POA   Requests DNR --done   Requests no tube feeds if cognitively unaware   Review of Systems Appetite is "average" Weight is stable Some issues with sleep Wears seat belt Dentures--trouble with lowers fitting Occasional back or foot pain--nothing striking. Is seeing Dr Paulla Dolly for heel pain Some leg cramps--night Bowels okay with miralax concoction No skin problems--other than easy bruising    Objective:   Physical Exam  Constitutional: No distress.  HENT:  Mouth/Throat: Oropharynx is clear and moist. No oropharyngeal exudate.    Neck: No thyromegaly present.  Cardiovascular: Normal rate, regular rhythm, normal heart sounds and intact distal pulses.  Exam reveals no gallop.   No murmur heard. Pulmonary/Chest: Effort normal and breath sounds normal. No respiratory distress. He has no wheezes. He has no rales.  Abdominal: Soft. There is no tenderness.  Musculoskeletal: He exhibits no edema or tenderness.  Lymphadenopathy:    He has no cervical adenopathy.  Neurological: He is alert.  "July, [birth year] hundred" President--- "I know him" [phone] [id number] Recall 3/3  Tremor, marked bradykinesia and some stiffness  Skin: No rash noted. No erythema.  Psychiatric:  Fairly normal engagement-- but slightly tearful discussing his mood          Assessment & Plan:

## 2016-10-26 NOTE — Assessment & Plan Note (Signed)
Has exertional chest tightness that gets better with rest No Rx indicated (and nitro would increase fall risk)

## 2016-10-26 NOTE — Assessment & Plan Note (Signed)
Copies of living will and [facility] POA given today Has DNR

## 2016-10-26 NOTE — Assessment & Plan Note (Signed)
Adjusting furosemide based on pacer readings and weight

## 2016-10-27 ENCOUNTER — Encounter: Payer: Self-pay | Admitting: *Deleted

## 2016-10-27 ENCOUNTER — Other Ambulatory Visit: Payer: Self-pay | Admitting: *Deleted

## 2016-10-27 ENCOUNTER — Ambulatory Visit (INDEPENDENT_AMBULATORY_CARE_PROVIDER_SITE_OTHER): Payer: Medicare Other | Admitting: Podiatry

## 2016-10-27 DIAGNOSIS — I999 Unspecified disorder of circulatory system: Secondary | ICD-10-CM | POA: Diagnosis not present

## 2016-10-27 DIAGNOSIS — I701 Atherosclerosis of renal artery: Secondary | ICD-10-CM

## 2016-10-27 DIAGNOSIS — M722 Plantar fascial fibromatosis: Secondary | ICD-10-CM

## 2016-10-27 MED ORDER — TRIAMCINOLONE ACETONIDE 10 MG/ML IJ SUSP
10.0000 mg | Freq: Once | INTRAMUSCULAR | Status: AC
  Administered 2016-10-27: 10 mg

## 2016-10-27 NOTE — Progress Notes (Signed)
abu

## 2016-10-27 NOTE — Progress Notes (Signed)
Subjective:    Patient ID: Ian [name], male   DOB: 83 y.o.   MRN: [id number]   HPI patient states heel pain has returned worse in the right than the left and it's painful from the plantar direction    ROS      Objective:  Physical Exam neurovascular status intact with exquisite plantar fasciitis right over left that still is present mostly at night with history of cardiac issues and Parkinson's disease     Assessment:    Fasciitis symptoms with also the possibility for vascular disease     Plan:    I reviewed both conditions and at this point I did inject the plantar fascial bilateral 3 Milligan Kenalog 5 mill grams Xylocaine. I then discussed vascular disease and that we may need to start him on gabapentin depending on results but I am sending her for vascular evaluation and then treatment depending on response

## 2016-10-27 NOTE — Patient Outreach (Addendum)
[facility]) Care Management  [place] Initial Home Visit 10/27/2016  Ian [name] 1929/03/06 [id number]  Ian [name] is an 83 y.o. male referred to [facility] from [facility] telephonic RN CM for evaluation and assessment of self-health management needs for chronic disease conditions and multiple falls. Patient has history including, but not limited to, Parkinson's disease, CAD with pacemaker placement in 2017, AV stenosis (with AVR in 2011), bradycardia, combined CHF, and GERD.  HIPAA/ identity verified with patient in person today at his home, and patient's wife Ian [name] is present during visit and actively participates in today's home visit.  Written THN CM consent obtained today.    Subjective: "Sometimes I just want to stop going to the gym and to church.... I seem to break down in tears any time anyone talks to me; I can't control my tears, or my emotions."  Assessment:  Ian [name] is frustrated around management of ongoing symptoms of parkinson's disease, which he believes has progressed significantly since last September when he experienced a hip fracture.  Ian [name] is teary throughout today's home visit, and he admits that he feels depressed/ sad most of the time, has periods of delusions/ hallucinations, and today he does not wish to respond to direct questioning about SI.  Ian [name] has continued with his normal activities as much as allowed, but shares that he has considered stopping his social activities, as he is tired of crying without the ability to control his emotions when he is around others.  Ian [name] relies on his wife for all of his care needs, and he has supportive sons that assist as allowed.  Ian [name] is also frustrated and fatigued with her role as primary caregiver for her husband.  Both [name] are interested in knowing more about options available to them around assisted living facilities for consideration for the future, as  both Mr. and Ian [name] know that the parkinson's symptoms will probably get worse over time.  Ian [name] would like to know strategies for how they should respond to patient's episodes of of delusions, crying, and tearyness.  Ian [name] has very well established practices in place for self-health management of chronic disease state of CHF and he regularly exercises and is compliant with all aspects of his overall plan of care.  Ian [name] is a high fall risk.  Today, patient reports that he is "doing okay, about the same as always," and he denies pain/ concerns, and is in no distress throughout entirety of today's home visit.  Medications: -- reports has all medicationsand takes as prescribed;denies questions about current medications.  -- reports wife manages all of his medications using weekly pill planner box, as he "cannot keep up" with all the medicines he is currently taking -- wife verbalizes good general understanding of the purpose, dosing, and scheduling of medications, and also denies questions about current medications.   -- medication reconciliation performed during visit today -- wife/ patient confirm that they successfully communicated with [facility] Pharmacist and deny further questions/ issues/ concerns around patient's medications  Provider appointments: -- patient and wife report accurate understanding of upcoming scheduled provider appointments as compared to review of EMR -- confirm that patient attended PCP appointment yesterday, October 26, 2016; and podiatrist earlier today, October 27, 2016; wife reports good understanding of overall plan of care for patient; visit notes briefly reviewed with patient/ spouse during today's home visit -- upcoming neurologist appointment scheduled  November 04, 2016 -- patient/ wife verbalize intent to attend all scheduled provider appointments -- wife will drive patient to all appointments, as patient "has not driven in years."  Advanced  Directive (AD) planning: -- Patient reports that he has HCPOA, living will, and DNR form; denies desire to make changes to any today -- reports that these documents were taken to PCP visit yesterday and it was confirmed during home visit today that documents have been uploaded in EMR  Safety/ Mobility/ Falls: -- patient/ wife report multiple episodes of falling due to imbalance/ gait issues with Parkinson's disease -- exhibits steady purposeful gait while ambulating around home today without use of assistive devices; uses cane when he goes outside of house to gym/ errands/ provider appointments -- patient and wife report that patient is "independant" for ADL's except for showering; wife assists patient with showering/ bathing, primarily in an effort to prevent falls; assists with dressing patient for buttoning shirt, tying shoelaces due to patient's significant bilateral UE tremors -- No obvious fall risks/ hazards identified in patient's home during home visit today -- general fall risks/ prevention education discussed with patient and his wife today  Holiday representative needs: -- have local sons who are supportive and actively involved in patient's care as needed -- patient has private duty sitter on Tuesday's and Thursday's for 4 hours so that patient's wife is able to attend church activities, hobbies, etc Ian [name]") -- patient and his wife regularly participate in exercise programs at their senior center and are active in church  -- deny need for community resources, however, both patient and his wife verbalize that they are very interested in exploring options available to them around [place] facilities, primarily for the "future," as they "know the Parkinson's will only get worse."  [name] expresses concern about what would happen to patient if she were to get sick and be unable to care for him.  Patient and wife state they have looked into Assisted Living facilities in the  past, but were disappointed in the places they looked, as "the rooms were so tiny." -- Discussed possibility of placing referral to [facility] CSW to assist in exploring options for Assisted Living Facility's available around their preferences, and they were agreeable to this. -- Provided patient/ spouse with flyer for [facility] for their review -- Wife/ patient try to stay "social" and "active" however, with patient's increased episodes of teariness and crying, he has found himself reluctant to continue socializing, which is "not normal" for him; both patient and spouse would like to know strategies regarding how they should respond personally and to other people regarding patient's episodes of of delusions, crying, and teariness -- Patient expresses sadness that he "is a burden" to his wife and that he must be cared for/ "watched" 24-7."  Self-health management of chronic disease state of Parkinson's disease and CHF/ medication management: -- currently have no definitive action plan for parkinson's disease; again state that "they know" patient's disease is "progressing," stating "as long as we've lived with it, we can tell it's only getting worse." -- primary plan of action is to attend regular neurology provider appointments and stay in contact with neurologist when needed for new concerns, compliance with overall plan of care; patient and his wife verbalize great trust in neurologist and PCP providers -- previously attended support group for patient's and families that suffer from Parkinson's Disease  -- monitors and records weights and BP's "several times" every week; reports weight ranges from "  low 140's to mid 140's."  Reports last weight this morning at "141 lbs" -- discussed weight gain guidelines for self-health management of CHF, including guidelines to contact PCP/ cardiologist for weight gain > 3 lbs overnight/ 5 lbs in one week; patient and wife report they have never been told to  contact providers for weight gain until recently; weight gain guidelines with rationales discussed thoroughly with both patient/ spouse; education on CHF zones was provided and reviewed with patient and wife thoroughly . -- reports eating "regular diet" with small frequent daily meals  Patient and his wife deny further issues, concerns, or problems today. I confirmed that patient/ wife havemy direct phone number, the main [facility] CM office phone number, and the [facility] CM 24-hour nurse advice phone number should issues arise prior to next scheduled [place] outreach, and we scheduled next [place] phone call for next week after patient's scheduled neurology provider office appointment.  Objective:    BP 126/64   Pulse 64   Resp 16   Wt 141 lb (64 kg)   SpO2 93%   BMI 23.11 kg/m    Review of Systems  Constitutional: Negative.   Eyes: Positive for blurred vision.       Reports currently seeing eye doctor "regularly" for vision issues, states, "they are trying to figure out what is wrong with my vision, and they want to do surgery to restore my vision."  Respiratory: Negative.  Negative for cough, shortness of breath and wheezing.   Cardiovascular: Negative.  Negative for chest pain, palpitations and leg swelling.  Gastrointestinal: Negative.  Negative for abdominal pain and nausea.  Genitourinary: Positive for frequency and urgency.       Diuretic therapy  Musculoskeletal: Positive for falls.       Hx multiple falls; no new falls reported today  Neurological: Positive for tremors.  Psychiatric/Behavioral: Positive for depression and hallucinations. The patient is not nervous/anxious.        Patient is clearly having difficulty controlling his emotions/ is deeply saddened/ depressed with progression of parkinson's disease; cries intermittently throughout today's home visit, and admits pervasive sadness. When SI screening attempted, patient states, "I prefer not to answer that  question." Patient looks to wife frequently for answers when asked a direct question Reports intermittent delusions/ hallucinations "from my Parkinson's Disease," denies presence of either today    Physical Exam  Constitutional: He is oriented to person, place, and time. He appears well-developed and well-nourished. No distress.  Cardiovascular: Normal rate, regular rhythm, normal heart sounds and intact distal pulses.   Pulses:      Radial pulses are 2+ on the right side, and 2+ on the left side.  Pacemaker  Respiratory: Effort normal and breath sounds normal. No respiratory distress. He has no wheezes. He has no rales.  GI: Soft. Bowel sounds are normal.  Musculoskeletal: He exhibits no edema.  Neurological: He is alert and oriented to person, place, and time.  Skin: Skin is warm and dry. No erythema.  Psychiatric: His behavior is normal.  See ROS; patient is clearly depressed   Plan:  Patient will continue taking his medications as prescribed and will attend all scheduled provider appointments  Patient will continue monitoring and recording his weights and BP's several times every week  Patient will continue using assistive devices for fall prevention as indicated  I will make [facility] CSW referral today and patient/ his spouse will communicate with CSW regarding options around ALF's available to them  and possibly about strategies of how to respond to patient's ongoing teariness, crying, and delusions associated with progression of Parkinson's Disease   I will share today's home visit notes with patient's PCP and neurologist  [facility] CM outreach to continue with scheduled [facility] RN CM phone call next week.  THN CM Care Plan Problem One     Most Recent Value  Care Plan Problem One  Need for ongoing reinforcement of self-health management of chronic disease states of Parkinson's disease and CHF  Role Documenting the Problem One  Care Management Coordinator  Care Plan for Problem One   Active  THN Long Term Goal   Over the next 60 days, patient and spouse will be able to verbalize action plan for ongoing self-health management for chronic disease state of Parkinson's Disease, as evidenced by patient/ spouse reporting during [facility] RN CM outreach  [facility] Long Term Goal Start Date  10/18/16  Interventions for Problem One Long Term Goal  Using teachback method, discussed with patient and spouse current health concerns of patient, current clinical status of patient, and current understanding of disease state of Parkinson's,  discussed caregiver considerations and their understanding of resources available to them and options for future care/ living situation,  [facility] CSW referral placed  THN CM Short Term Goal #1   Over the next 30 days, patient/ spouse will discuss current medications with [facility] pharmacist, as evidenced by patient/ spouse reporting and collaboration with [facility] Pharmacist during [facility] RN CM outreach  [facility] CM Short Term Goal #1 Start Date  10/18/16  [facility] CM Short Term Goal #1 Met Date  10/27/16  Interventions for Short Term Goal #1  Using teachback method, discussed with patient and his spouse role of [place] CM Short Term Goal #2   Over the next 14 days, patient and spouse will meet with [facility] RN CM for assessment of current needs/ education around self-health management of chronic disease state of Parkinson's disease and CHF, as evidenced by successful completion of [facility] RN CM initial home visit  THN CM Short Term Goal #2 Start Date  10/18/16  [facility] CM Short Term Goal #2 Met Date  10/27/16  Interventions for Short Term Goal #2  Using teachback method, discussed patient's current state of health with patient and spouse,  explained role of [facility] RN CM and scheduled initial home visit     I appreciate the opportunity to participate in Mr. [place] care,  Oneta Rack, RN, BSN, [employer] Coordinator [facility] Care Management  [phone]

## 2016-10-29 ENCOUNTER — Encounter: Payer: Self-pay | Admitting: Cardiology

## 2016-11-02 NOTE — Progress Notes (Signed)
Ian [name] was seen today in the movement disorders clinic for f/u.  He is accompanied by his wife who supplements the history.  The first symptom(s) the patient noticed was tremor in the R hand when he would use the hand or have a cup of coffee in it.  His wife believes that this began in 2004.  He was referred to Dr. Tilden Dome.  He tried some medication and according to his wife, he kept having adverse SE.  He was on mirapex and sinemet but reported dizziness and bad dreams with both.   He was referred to Dr. Yevonne Pax in about 2010 because he was interested in DBS but the patient was concerned about risks of the surgery and potential side effects.    09/28/12  I tried to change the levodopa back to the IR formulation but he did not like it because he was too drowsy and went back to the CR formulation.  , 2 po qid and carbidopa/levodopa 50/200 at night.    He has continued to have more tremor after the artane was d/c.  However, this has become less bothersome for him over time.  He does notice that sometimes the medication wears off quickly and sometimes it does not.  It seems inconsistent.  03/06/13 update:  The patient is accompanied by his wife who supplements the history.  Last visit, we tried to add entacapone to each of the 4 carbidopa/levodopa dosing times.  He called me because of nausea and we decreased it to twice a day dosing.  He continued to have nausea and the medication was discontinued.  The patient states that the medication makes his arms feel "heavy." He has stopped the carbidopa/levodopa 50/200 at night as he does not think it was beneficial.  Sometimes, he will wake up in the middle of the night and take an extra of the carbidopa/levodopa CR 25 100.  He and his wife have noted some jerking spells at night.  He sleeps very restless. I did review notes in his chart since last visit.  He went to the emergency room on September 21 with a sensation of palpitations and tachycardia.  His  pacemaker was interrogated and there were no problems and he was subsequently sent home.  06/11/13 update:  This patient is accompanied in the office by his spouse who supplements the history.  Pt is on carbidopa/levodopa 25/100 CR - 2 in the AM and 6 other dosages throughout the day.  When he awakens in the middle of the night he may take a few extra dosages and he estimates he takes a total daily levodopa dose of 1000-1200 mg per day.  He has been very senstive to medication and has not been able to tolerate the immediate release carbidopa/levodopa, entacapone or klonopin (made arms/legs hurt).  Tremor feels like it is getting worse.  Vision is getting worse but he went to the eye doctor and his vision was stable.  He feels like the levodopa only lasts 1.5-2 hrs.  No falls.  Is exercising.    08/13/13 update:   This patient is accompanied in the office by his spouse who supplements the history.  Pt is on carbidopa/levodopa 25/100, and takes approximately 8-10 tablets per day.  Last visit, I also added amantadine to see if that would help the tremor. He does think that has helped.  He presents today to try and change to rytary to see if it would last longer.  He does not wish to  try duopa.  He denies hallucinations, lightheadedness, falls, syncope or dyskinesia.     10/01/13 update:  Last visit, I changed the patient to rytary.  It turned out to be very expensive as the patient had no RX coverage, which I did not realize.  When he changed back, the IR formulation was accidentally called in instead of the CR prepartation (and he didn't tolerate the IR in the past).  The patient also didn't notice that and he isn't even sure now what he is taking but knows he is taking 2 po qid of either the CR or IR.  He is having hallucinations.  He is having swelling in the feet and legs as well.   They saw cardiology last week and were started on lasix and they didn't think that it made a difference.  They are worried  about a discoloration in legs.  He has lost weight.  Had one fall on May 10.  Walking into bedroom, got lightheaded and hit his back on the dresser.  He didn't think that he hit his head at the time but now that they think that he did as he has had hallucinations since.  However, looking back at our notes, it was 5/7 when he called here to d/c the rytary and changed to the IR levodopa it was 5/10 when he fell so timing was likely just coincidental as he had likely just changed back to the IR preparation.  11/12/13 update:  The patient was seen back in followup in the neurology clinic, accompanied by his wife who helps to supplement the history.  Patient has a history of Parkinson's disease.  Last visit, I changed his levodopa back to the CR preparation to see if that would help his hallucinations.  He is currently on 2 tablets by mouth 4 times per day (8am/12/4pm/8pm).  Today, the patient states that hallucinations continue.  He states that he continues to see people.   Medication helps tremor when he first takes it and then the tremor comes back before the next dose.   Last visit, I told him to discontinue his amantadine because of complaints of swelling.  I did review records since our last visit.  Because he noticed no changes in swelling after discontinuation of the amantadine, he followed up with his primary care physician.  His primary care physician felt that the swelling was likely due to venous insufficiency and he recommended that the patient restart the amantadine and he has.  The patient does state that he doesn't think that he had any break between d/c the IR carbidopa/levodopa 25/100, starting the CR version and when he restarted the amantadine.   The patient did see his cardiologist and he saw no cardiac reason for the edema.  He did not want to increase the patient's Lasix further because of complaints of orthostasis and dizziness.  No significant etiology was determined for his weight loss  either.  01/07/14 update:  The patient was seen today in followup in the neurology clinic, accompanied by his wife who helps to supplement the history.  The patient has switched back on his own from the CR version of carbidopa/levodopa to the immediate release form.  He is on 2 po 5 times per day (8am and then every 3 hours).  He is complaining about more tremor and more balance issues.  No falls but near falls.  He is off of amantadine.  Hallucinations went away after it was d/c but tremor definitely picked up.  Leg swelling is better but not gone.  Some memory problems.  No driving since march, 2015.    05/06/14 update:  The patient is seen in f/u, accompanied by his wife who supplements the history.   He is on carbidopa/levodopa 25/100 CR, 2 po 5 times per day (8am and then every 3 hours). He continues to c/o increasing tremor.  States that he is having m. Cramping at nighttime.  His last dose is at 8pm and bedtime is at 10 pm and cramping will start waking him at 3:30 am. Asks about trying to take a combination of one CR and one IR and see how that works.   He states that his memory is "going downhill" although his wife is not quite as sure.  He doesn't drive and hasn't for a year but has not trouble with pills.    08/06/14 update:  The patient is following up today, accompanied by his wife who supplements the history.  Last visit, the patient wanted to try a combination of carbidopa levodopa 25/100 immediate release and extended release so that he was taking 1 pill of each 5 times per day; however he really never did that and is still taking a carbidopa/levodopa 25/100 IR, 2 po 5 times a day.  We did add a carbidopa/levodopa 50/200 at night to see if that would help with his cramping;  The cramping is better at night and he only seems to have it if he has swelling.  His wife mentions that memory is not quite as good as it used to be.  He has some difficulty remembering previous conversations and having word  finding difficulties.  It is not a major issue, but it is something she is noticing.  Much has happened in his other medical history since last visit.  I reviewed those records.  The patient had a nuclear stress test on 06/13/2014, demonstrating a high risk study with a large area of ischemia in the LAD artery distribution and a fixed defect in the inferior wall.  An echocardiogram demonstrated a left ventricular ejection fraction of approximately 20%, which was much decreased compared to his prior study of 50-55%.  A heart catheterization was subsequently scheduled and performed on 06/20/2014.  There was no evidence of ischemia to account for the dropped ejection fraction.  It was felt that he was likely not a candidate for an ICD and they talked about CRT and decided to hold on that.  They saw Dr. Rayann Heman yesterday.  12/03/14 update:  The patient presents today, accompanied by his wife who supplements the history.  The records that were made available to me were reviewed.  Last visit, we talked about our limited options, and ultimately decided to try a combination of carbidopa/levodopa 25/100 IR and carbidopa/levodopa 25/100 CR, one tablet each of these 5 times per day.  There was some misunderstanding and he ended up taking carbidopa/levodopa 25/100 IR in addition to carbidopa/levodopa 50/200 CR, 5 times per day.  He was then taking the carbidopa/levodopa 50/200 at night as well.  Overall, this helped the tremor but hallucinations picked up and therefore he went back to carbidopa/levodopa 25/100 IR, 2 po 5 times a day.  He is having more difficulty opening his hands in the morning.  Pts biggest c/o is vision change and he thinks that it is from his PD meds.  Been to [facility] in November and I reviewed those records and they said that they thought that it was primarily surface related  but didn't think that it was significant.  He tried eye drops for dry eye and nothing helped.  He asks me multiple times what else he  can do.  His wife also asks me about a referral for a consultation regarding focused ultrasound.  04/04/15 update:  The patient is following up today, accompanied by his wife who supplements the history.  He has a history of Parkinson's disease.  He is on carbidopa/levodopa 25/100, 2 tablets 5 times per day and he went back on carbidopa/levodopa 50/200 qhs.  Last visit, he would not levodopa was affecting his vision, although I did not.  Regardless, we decided to stop the medication for a few days.  He was not able to tolerate being off the medication because tremor was so bad and ended up restarting it fairly quickly after stopping it.  Tremor continues to be bothersome and asks if there is anything else he can do.  Depression is getting worse because of that.  No suicidal or homicidal ideation.  He is doing yoga with his wife and enjoys that.  He has had no falls but has had some near falls.  I did refer him to Dr. Sanda Klein regarding his vision change and he is going in Jan (pt moved appt back).  His wife had also requested a referral last visit to [facility] to possible focused ultrasound, but ultimately they decided that they did not want to go to the appointment.  I think that is probably a good decision as focused u/s is not indicated yet for Parkinsons disease.  They do bring in a brochure today about duopa and ask me about that today.  Also saw an article in paper about study Dr. Jimmey Ralph is doing at [facility] and he called the number.  He showed me the article today and it was for patients who have had little weak body dementia and Parkinson's dementia with hallucinations.  He has begun to have a few more hallucinations since last visit, but reports that he knows that they are not real.  07/10/15 update:  The patient is following up today, accompanied by his wife who supplements the history.  He has a history of Parkinson's disease.  He is on carbidopa/levodopa 25/100, 2 tablets 5 times per day (8/11/2/5/8) in  addition to carbidopa/levodopa 50/200 at night (10pm). He thinks that the medication wears off before next visit and asks about taking the 50/200 in addition to one 25/100.  I reminded him that he accidentally tried that last august but he had more hallucinations.  He states that he just tried that recently with the 25/100 just like he did last august and didn't have the hallucinations.  He did try Neupro since our last visit and worked up to 4 mg.  He really did not find this beneficial.  He did not have side effects with that.  It did not make hallucinations worse, and in fact he thought they were potentially even better.  He did not want to go up to 6 mg and ended up discontinuing the medication.  He has not had any falls since last visit but does feel that he has been more unstable.  Sometimes will yell out at night but seems infrequent.  No hallucinations or near syncope.  I did review records since last visit.  He was seen back at [facility] and ended up having eye surgery for lower eyelid retraction bilaterally on March 1.    11/24/15 update:  The patient is following  up today, accompanied by his wife who supplements the history.  After our last visit, the patient was supposed to be on carbidopa/levodopa 50/200 CR 5 times per day and no more than carbidopa/levodopa 25/100 3 times per day.  He called me quite some time after last visit to state that he had gone up on the carbidopa/levodopa 50/200, to 6 times per day and carbidopa/levodopa 25/100, 5 times per day and subsequently hallucinations had become very prominent.  I explained that unfortunately, this is why I did not want him to go up on the medication, as he was not a Nuplazid candidate because of the fact that he already had QT prolongation.  He went back to taking them as he was supposed to but he is still having some hallucinations.  He recognizes that they aren't real.  No falls since our last visit.  He is exercising some but gets tired quickly.   They asked me about xadago.   On July 13, he did undergo surgical placement of a new pacemaker.  He didn't notice a big difference in stamina with the new PPM but he has been able to breathe better.    12/26/15 update:  Pt returns for f/u, accompanied by his wife who supplements the history.  He is still on carbidopa/levodopa 25/100 3 times a day and carbidopa/levodopa 50/200 CR five times a day.  Pt tells me he is actually taking carbidopa/levodopa 50/200, 2 po 5 times a day but wife doesn't think he is doing it that way.  She isn't sure but has realized she needs to manage meds.  He will sometimes get up in the middle of the night and take an extra carbidopa/levodopa 25/100 and an extra 50/200.  Last visit the patient really wanted to try xadago because he had a friend on it who was doing great.  I was not convinced it would be that helpful and initially he decided to not try it because of this but ultimately did try it.  Pts wife noted confusion in the patient and she initially thought that it was the medication (although she had noted confusion prior to that and it was documented in a phone encounter).  The medication was d/c.  He has begun to have paranoia and has heard noises in the house in addition to visual hallucinations.  Has called his wife a liar when she tells him that no one is there.  Wife states that confusion continues.  He has asked his wife "how long he will live in this house" when they have owned that house for years.  Wife states that hallucinations have become more "complex."  Had UA that was neg (except dehydration).  Urine cx was negative.  Wife also notes more unsteady on feet  03/25/16 update:  Patient returns for follow-up, accompanied by his wife who supplements the history. He is still on carbidopa/levodopa 50/200 CR 5 times a day and carbidopa/levodopa 25/100 IR 3 times a day.  I had planned to see him about a week and a half after her last visit, but unfortunately he fell and  fractured his femur on the left and had surgery on 01/06/2016.  He subsequently went to [facility] for rehabilitation.  He did have post op delirium.  Wife states that "confusion looms."  Didn't quite get back to baseline.  He is back at home.  He is having more hallucinations.  They are now not just visual but auditory as well.  Confuses wifes position in  the home - will say "are you the one who cooks for me?"  Will then say, "who is the person who sleeps in my bed."  07/28/16 update:  Patient returns today for follow-up, accompanied by his wife who supplements the history.  Patient is on carbidopa/levodopa 50/200 5 times per day and carbidopa/levodopa 25/100 3 times per day.  Wife notes that after 2.5 hours, tremors increase and she worries about changing med.  With the assistance and permission of cardiology, Nuplazid was started last visit.  His QT did lengthen somewhat after that, but his cardiologist felt that it was okay to continue it and follow the EKG.  His last EKG was just done on 07/23/2016.  His QTC prior to Nuplazid was 438.  His QTC on 04/09/2016 was 487.  His QTC on 07/23/2016 was 488.  Because it had been stable, cardiology felt that it was okay to continue the medication.  Wife states that hallucinations are worse as are delusions. Most hallucinations are in the form of humans.  Most are not frightening.  He will ask wife for reassurance that they aren't there and recognizes they aren't there but wife states that he won't recognize that delusions aren't normal.  He is convinced that wife leaves house in the middle of the night and goes out in the middle of the night and goes out with others.  He also thinks that someone else has access to the bank account.   She would like to get off of it.  Few minor falls since last visit.  One missed a chair.  One fell between 2 chairs.  Few near falls.    11/04/16 update:  Patient seen today, accompanied by his wife who supplements the history.  He is on  carbidopa/levodopa 50/200 5 times a day and carbidopa/levodopa 25/100 3 times per day.  Wife notes wearing off phenomenon with tremor.  They stopped the quetiapine since last visit.  We did start Depakote as a mood stabilizer, hoping that that would not worsen tremor.  Today, they state that they are not sure if it helped but it may have helped slightly.  They had denied pseudobulbar affect in the past, but upon reading the [facility] care note (the nurse also contacted me) it seems that this has become a big issue and he is crying uncontrollably, sometimes even without feeling depressed.  This has gotten in the way of feeling he can go out socially.  Wife does state that he is regularly tearful but is usually in response to something.  His visual spatial perception is also off.  Having foot and heel pain and seeing podiatry.  Having some cramping but better than used to be.  In regards to his sleep, they state that this seems to be hit and miss.  He has had no falls.  He is still having visual hallucinations that are becoming a problem as he gets angry when wife doesn't see them.  He also gets angry when caregiver comes in 2 days a week.  Neuroimaging has previously been performed.  It is not available for my review today.  PREVIOUS MEDICATIONS: Sinemet (reported bad dreams/dizziness on regular formulation and same with Mirapex); requip;  on Sinemet CR currently, entacapone (felt dizzy and arms felt "heavy"); klonopin (made arms/legs hurt); artane; amantadine (hallucinations); rytary (costly and pt has no RX coverage); neupro; nuplazid; Seroquel (helped sleep but felt that it caused him to be somewhat achy)  ALLERGIES:   Allergies  Allergen Reactions  .  Plavix [Clopidogrel]     Fatigue   . Pravastatin     Muscle aches  . Zocor [Simvastatin]     REACTION: dizziness  . Penicillins Rash    Has patient had a PCN reaction causing immediate rash, facial/tongue/throat swelling, SOB or lightheadedness with  hypotension: YES Has patient had a PCN reaction causing severe rash involving mucus membranes or skin necrosis: NO Has patient had a PCN reaction that required hospitalization NO Has patient had a PCN reaction occurring within the last 10 years: NO If all of the above answers are "NO", then may proceed with Cephalosporin use.    CURRENT MEDICATIONS:  Current Outpatient Prescriptions on File Prior to Visit  Medication Sig Dispense Refill  . acetaminophen (TYLENOL) 500 MG tablet Take 500 mg by mouth every 6 (six) hours as needed for mild pain.    Marland Kitchen aspirin 81 MG tablet Take 81 mg by mouth daily.    . bisoprolol-hydrochlorothiazide (ZIAC) 5-6.25 MG tablet Take 1 tablet by mouth daily.    . carbidopa-levodopa (SINEMET CR) 50-200 MG tablet TAKE 1 TABLET 5 TIMES DAILY 450 tablet 1  . carbidopa-levodopa (SINEMET IR) 25-100 MG tablet TAKE 1 TABLET 3 TIMES A DAY 270 tablet 1  . divalproex (DEPAKOTE ER) 250 MG 24 hr tablet Take 1 tablet (250 mg total) by mouth daily. 30 tablet 1  . furosemide (LASIX) 80 MG tablet Take 80 mg by mouth daily.    . lansoprazole (PREVACID) 15 MG capsule Take 15 mg by mouth daily as needed (Approx 2 times weekly). gerd    . Multiple Vitamins-Minerals (PRESERVISION AREDS PO) Take 1 tablet by mouth daily.    Vladimir Faster Glycol-Propyl Glycol (SYSTANE ULTRA OP) Place 1 drop into both eyes daily.    . polyethylene glycol (MIRALAX / GLYCOLAX) packet Take 17 g by mouth daily.    . potassium chloride (K-DUR) 10 MEQ tablet Take 20 mEq by mouth daily.    . vitamin B-12 (CYANOCOBALAMIN) 1000 MCG tablet Take 1,000 mcg by mouth daily.     No current facility-administered medications on file prior to visit.     PAST MEDICAL HISTORY:   Past Medical History:  Diagnosis Date  . Allergy   . Aortic valve stenosis, acquired    a. s/p Porcine AVR 2001;  b. 09/2014 [place] Echo: mod-sev bioprosthetic AS w/ minimal change in CO and only mild increase in gradients w/ dobutamine.   Marland Kitchen CAD  (coronary artery disease)    a. s/p CABG with LIMA-LAD in 2001 with AVR. b. BMS to LCx 8/08. c. Abnl nuc/dec EF 05/2014 - s/p cath with patent LIMA to LAD, patent dominant right, mild LCx disease, patent stent. Med rx.  . Chronic combined systolic (congestive) and diastolic (congestive) heart failure (HCC)    a. EF 50-55% in 02/2013;  b. 05/2014 Echo: EF 20-25%, Gr1 DD.  Marland Kitchen Diverticulitis   . ED (erectile dysfunction)   . Essential hypertension   . GERD (gastroesophageal reflux disease)   . Hyperlipidemia   . MALT lymphoma ([place])   . Nonischemic cardiomyopathy ([place])    a. 05/2014 Echo: EF 20-25%-->f/u cath w/ nonobs dzs.  . Osteoarthritis   . Osteoporosis   . Parkinson's disease   . Renal artery stenosis (HCC)    a. 90% RRA stenosis previously. b. Duplex 09/2014: R renal artery 60-99%, L renal artery 1-59%.  . S/P cardiac pacemaker procedure, Medtronic Adapta L  ADDRL1, 01/21/12    a. 01/2012 s/p MDT [place]  PPM (ser # [id number]).  . Symptomatic bradycardia    a. 01/2012 s/p MDT [place] PPM (ser # [id number]).  Marland Kitchen TIA (transient ischemic attack) 06/08   a. 09/2006    PAST SURGICAL HISTORY:   Past Surgical History:  Procedure Laterality Date  . AORTIC VALVE REPLACEMENT  2001   Porcine valve  . CARDIAC CATHETERIZATION  06/20/2014   Procedure: CORONARY/GRAFT ANGIOGRAPHY;  Surgeon: Lorretta Harp, MD;  Location: [facility] CATH LAB;  Service: Cardiovascular;;  . CATARACT EXTRACTION  06/09   left  . CORONARY ARTERY BYPASS GRAFT  2001   ( van trigt ) aortic valve replacement 2001; LIMA-LAD.  Marland Kitchen CORONARY STENT PLACEMENT  11/2007   8/09  Left Circumflex Stent by Dr Toy Care started and aggrenox stopped  . EP IMPLANTABLE DEVICE N/A 10/30/2015   Procedure: BiV Pacemaker Upgrade;  Surgeon: Thompson Grayer, MD;  Location: [place] CV LAB;  Service: Cardiovascular;  Laterality: N/A;  . ESOPHAGOGASTRODUODENOSCOPY     multiple, Colon/ EGD benign 11/2004  . EYE SURGERY  06/18/15  . EYE  SURGERY  04/2016  . INGUINAL HERNIA REPAIR     LIH 1997Llap. bilateral hernias 1998  . LAPAROSCOPIC CHOLECYSTECTOMY  07/11   Dr.Rosenbower  . NM MYOCAR PERF WALL MOTION  11/22/2007   mild septal ischemia,EF 66%  . PACEMAKER INSERTION  01/21/2012   Medtronic Adapta L implanted for complete heart block  . PERMANENT PACEMAKER INSERTION N/A 01/21/2012   Procedure: PERMANENT PACEMAKER INSERTION;  Surgeon: Thompson Grayer, MD;  Location: [facility] CATH LAB;  Service: Cardiovascular;  Laterality: N/A;  . PILONIDAL CYST / SINUS EXCISION  1953  . SQUAMOUS CELL CARCINOMA EXCISION  3/13   back  . TOTAL HIP ARTHROPLASTY Left 01/06/2016   Procedure: TOTAL HIP ARTHROPLASTY ANTERIOR APPROACH;  Surgeon: Hessie Knows, MD;  Location: [facility] ORS;  Service: Orthopedics;  Laterality: Left;    SOCIAL HISTORY:   Social History   Social History  . Marital status: Married    Spouse name: N/A  . Number of children: 3  . Years of education: N/A   Occupational History  . retired- [employer]   .      Social History Main Topics  . Smoking status: Former Smoker    Packs/day: 1.00    Years: 5.00    Types: Cigarettes    Quit date: 06/06/1963  . Smokeless tobacco: Never Used  . Alcohol use No     Comment: very rare  . Drug use: No  . Sexual activity: No   Other Topics Concern  . Not on file   Social History Narrative   Has living will   Wife, then son [name], hold health care POA   Requests DNR --done   Requests no tube feeds if cognitively unaware    FAMILY HISTORY:   Family Status  Relation Status  . Mother Deceased at age 13       old age  . Father Deceased at age 88       acute bronchitis, cotton exposure  . Sister Deceased       trauma  . Brother Deceased       2, deceased  . Daughter Deceased at age 33       osteosarcoma  . Child Alive       3, healthy  . Brother Alive       healthy  . Sister Alive       healthy    ROS:  A complete 10 system review of systems was obtained and  was unremarkable apart from what is mentioned above.  PHYSICAL EXAMINATION:    VITALS:   There were no vitals filed for this visit. Wt Readings from Last 3 Encounters:  10/27/16 141 lb (64 kg)  10/26/16 145 lb (65.8 kg)  09/14/16 146 lb 9.6 oz (66.5 kg)     GEN:  The patient appears stated age and is in NAD.   HEENT:  Normocephalic, atraumatic.  The mucous membranes are moist. The superficial temporal arteries are without ropiness or tenderness. CV:  RRR Lungs:  CTAB Neck/HEME:  There are no carotid bruits bilaterally.  Neurological examination:  Orientation: The patient is alert and oriented x2. Montreal Cognitive Assessment  12/26/2015 12/03/2014  Visuospatial/ Executive (0/5) 0 2  Naming (0/3) 3 3  Attention: Read list of digits (0/2) 2 1  Attention: Read list of letters (0/1) 1 0  Attention: Serial 7 subtraction starting at 100 (0/3) 1 2  Language: Repeat phrase (0/2) 2 2  Language : Fluency (0/1) 0 0  Abstraction (0/2) 2 1  Delayed Recall (0/5) 1 2  Orientation (0/6) 4 5  Total 16 18  Adjusted Score (based on education) 17 18    Movement examination: Tone: There is normal tone in the bilateral upper extremities today.  The tone in the lower extremities is normal.  Abnormal movements: There is a moderate resting tremor bilaterally, R>L.  No dyskinesia. Coordination:  There is no decremation, with any form of RAMS, including alternating supination and pronation of the forearm, hand opening and closing, finger taps, heel taps and toe taps. Gait and Station: The patient uses the cane to ambulate and does well with that.  Lab Results  Component Value Date   WBC 8.0 09/14/2016   HGB 13.3 09/14/2016   HCT 38.9 09/14/2016   MCV 92 09/14/2016   PLT 371 09/14/2016     Chemistry      Component Value Date/Time   NA 142 09/14/2016 1620   K 4.8 09/14/2016 1620   CL 96 09/14/2016 1620   CO2 31 (H) 09/14/2016 1620   BUN 18 09/14/2016 1620   CREATININE 0.84 09/14/2016 1620    CREATININE 0.75 03/15/2016 1342      Component Value Date/Time   CALCIUM 9.8 09/14/2016 1620   ALKPHOS 64 12/18/2015 1045   AST 13 12/18/2015 1045   ALT <3 (L) 12/18/2015 1045   BILITOT 0.5 12/18/2015 1045     Lab Results  Component Value Date   VITAMINB12 660 03/06/2013   Lab Results  Component Value Date   TSH 4.349 06/17/2014     ASSESSMENT/PLAN:  1.  Idiopathic Parkinsons disease.    -Long discussion again with the patient and his wife.  Unfortunately, he really is in a position where we cannot go up further on the levodopa because of hallucinations and he is not a candidate for Nuplazid/seroquel because of prolonged QT.  He is not a DBS candidate because of multiple other medical problems.  He will take carbidopa/levodopa 50/200 CR, 5 times per day and carbidopa/levodopa 25/100 IR, up to 3times per day.   May need to decrease this but wife doesn't want to right now as tremors increasing.  Would perhaps help with hallucinations  -given information on [facility] [place] but would need approval from cardiologist  -long discussion on end of life care.   2.  Parkinson disease dementia with hallucinations  -we discussed restarting his seroquel but decided  to hold for just a few weeks as they wanted to try neudexta.  I did tell him that neudexta will not help the hallucinations 3.  B12 deficiency.  -He is on oral supplements  4.  Depression and PBA  -start neudexta.  Risks, benefits, side effects and alternative therapies were discussed.  The opportunity to ask questions was given and they were answered to the best of my ability.  The patient expressed understanding and willingness to follow the outlined treatment protocols.  -if not helpful, restart seroquel and titrate to higher dose.  I don't think it caused myalgia.  -d/c depakote  -no SI/HI 5.  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  -He could not tolerate klonopin.  Is overall mild and talked about home safety 6.  I. will see him back in the next  4 months.  Much greater than 50% of this visit was spent in counseling and coordinating care.  Total face to face time:  45 min

## 2016-11-03 ENCOUNTER — Other Ambulatory Visit: Payer: Self-pay | Admitting: *Deleted

## 2016-11-03 NOTE — Patient Outreach (Signed)
[facility]) Care Management  11/03/2016  LINZIE BOURSIQUOT Apr 11, 1929 [id number]   Referral received from [facility]  to provide support to patient's family related to out of home care and coping with patient's condition. Phone call to patient's spouse. There was no answer. HIPPA compliant voicemail message left requesting a return call.   Sheralyn Boatman [name] Care Management [phone]

## 2016-11-04 ENCOUNTER — Ambulatory Visit (INDEPENDENT_AMBULATORY_CARE_PROVIDER_SITE_OTHER): Payer: Medicare Other | Admitting: Neurology

## 2016-11-04 ENCOUNTER — Other Ambulatory Visit: Payer: Self-pay | Admitting: *Deleted

## 2016-11-04 ENCOUNTER — Encounter: Payer: Self-pay | Admitting: Neurology

## 2016-11-04 VITALS — BP 130/80 | HR 72 | Ht 66.5 in | Wt 147.0 lb

## 2016-11-04 DIAGNOSIS — I701 Atherosclerosis of renal artery: Secondary | ICD-10-CM | POA: Diagnosis not present

## 2016-11-04 DIAGNOSIS — R441 Visual hallucinations: Secondary | ICD-10-CM | POA: Diagnosis not present

## 2016-11-04 DIAGNOSIS — G20 Parkinson's disease: Secondary | ICD-10-CM

## 2016-11-04 DIAGNOSIS — G20A1 Parkinson's disease without dyskinesia, without mention of fluctuations: Secondary | ICD-10-CM

## 2016-11-04 DIAGNOSIS — F0280 Dementia in other diseases classified elsewhere without behavioral disturbance: Secondary | ICD-10-CM | POA: Diagnosis not present

## 2016-11-04 MED ORDER — CARBIDOPA-LEVODOPA 25-100 MG PO TABS: ORAL_TABLET | ORAL | 1 refills | Status: AC

## 2016-11-04 MED ORDER — CARBIDOPA-LEVODOPA ER 50-200 MG PO TBCR: EXTENDED_RELEASE_TABLET | ORAL | 1 refills | Status: AC

## 2016-11-04 MED ORDER — DEXTROMETHORPHAN-QUINIDINE 20-10 MG PO CAPS: 2.0000 | ORAL_CAPSULE | Freq: Every day | ORAL | 0 refills | Status: DC

## 2016-11-04 NOTE — Patient Instructions (Addendum)
1. Start Nuedexta one tablet daily for a week, than one tablet twice daily. Samples given.   2. Stop Depakote.   3. Information given on [employer]. You will need to get clearance from your heart doctor before starting the program.

## 2016-11-04 NOTE — Addendum Note (Signed)
Addended byMargarette Asal L on: 11/04/2016 11:30 AM   Modules accepted: Orders

## 2016-11-04 NOTE — Patient Outreach (Signed)
[facility]) Care Management  11/04/2016  [name] 04-01-1929 [id number]    Phone call to patient's spouse to discuss facility care. Per patient's spouse, they are at the beginning stages of looking at their options when the time comes. Per patient's spouse, she is looking into the [employer].   Plan: This Education officer, museum will compose a list of CCRC's to forward to patient's spouse.   Sheralyn Boatman [facility] Care Management [phone]

## 2016-11-05 ENCOUNTER — Other Ambulatory Visit: Payer: Self-pay | Admitting: *Deleted

## 2016-11-05 ENCOUNTER — Encounter: Payer: Self-pay | Admitting: *Deleted

## 2016-11-05 NOTE — Patient Outreach (Signed)
[facility]) Care Management [place] Telephone Outreach  11/05/2016  Ian [name] 04/29/1928 [id number]  Successful telephone outreach to Ian [name], wife/ primary caregiver of Ian [name] is an 83 y.o. male referred to [place] from [facility] telephonic RN CM for evaluation and assessment of self-health management needs for chronic disease conditions and multiple falls. Patient has history including, but not limited to, Parkinson's disease, CAD with pacemaker placement in 2017, AV stenosis (with AVR in 2011), bradycardia, combined CHF, and GERD. HIPAA/ identity verified with patient's caregiver/ spouse, on [facility] CM written consent, during phone call today.   Today, patient's spouse reports that patient is "doing okay, nothing much has changed, he remains extremely depressed and teary." Ian [name] denies pain/ concerns outside of patient's "normal," and she states patient is in no distress.  Medications: -- reports has all medicationsand takes as prescribed;denies questions about current medications.  -- reports have started new medication prescribed yesterday during neurology provider appointment, neudexta; wife reports accurate understanding of dosing instructions as compared to neurology provider notes in EMR; confirms that patient is no longer taking depakote, as instructed by neurology provider yesterday. -- reports wife continues managing all of patient's medications using weekly pill planner box  Provider appointments: -- patient and wife report accurate understanding of upcoming scheduled provider appointments as compared to review of EMR -- attended scheduled neurologist appointment yesterday November 04, 2016; reports plans to try newly added medication "for several weeks" and to communicate with neurology provider regarding patient's tolerance of new medication. -- wife continues to verbalize intent to attend all scheduled provider appointments,  stating she will drive patient to all appointments.  Safety/ Mobility/ Falls: -- wife denies new/ recent falls since [place] home visit 10/27/16 -- general fall risks/ prevention education reiterated with patient's wife today  [name] needs: -- patient continues to have private duty sitter on Tuesday's and Thursday's for 4 hours so that patient's wife is able to attend church activities, hobbies, etc [name]") -- patient and his wife regularly participate in exercise programs at their senior center and are active in church; reports went to "fitness center this morning." -- patient and his wife previously verbalized that they are very interested in exploring options available to them around [place] facilities, primarily for the "future," as they "know the Parkinson's will only get worse."  Ian [name] confirms that she has spoken with [place], who is assisting in this process; Ian [name] also reports that Dr. Carles Collet neurology provider encouraged them to begin this process as well.  -- Previously provided patient/ spouse with flyer for [place] for their review during [place] initial home visit 10/27/16; wife confirms that she has contacted [employer] and is planning to attend one of their sessions this coming Tuesday, November 09, 2016.  -- Attended symposium on parkinson's disease "last week" through [facility] health, stated that they "really enjoyed" the symposium and thought it "was very helpful," stating, "it was uplifting and even fun, with a lot of really good information."  Self-health management of chronic disease state of Parkinson's disease and CHF/ medication management: -- primary plan of action is to attend regular neurology provider appointments and stay in contact with neurologist when needed for new concerns, compliance with overall plan of care; patient and his wife verbalize great trust in neurologist and PCP providers; are now  actively looking at options for the future around [place] with assistance from  [facility] CSW. -- now monitoring/ recording daily weights and BP's "several times" every week; reports weight ranges over last 2 weeks from "139-144 lbs, with a weight today of "139 lbs." -- reiterated weight gain guidelines for self-health management of CHF, including guidelines to contact PCP/ cardiologist for weight gain > 3 lbs overnight/ 5 lbs in one week; wife verbalizes good understanding of weight gain guidelines and CHF zones, stating that patient "has remained in green zone" since [facility] CM initial home visit 10/27/16; confirms that she has reviewed educational material previously provided to them during [place] home visit 10/27/16.  Ian [name] deniesfurther issues, concerns, or problems today. I confirmed thatpatient/ wife havemy direct phone number, the main [facility] CM office phone number, and the [facility] CM 24-hour nurse advice phone number should issues arise prior to next scheduled [place] outreach, and we discussed my general upcoming schedule.  Plan:  Patient will continue taking his medications as prescribed and will attend all scheduled provider appointments  Patient will continue monitoring and recording his daily weights and BP's several times every week  Patient will continue using assistive devices for fall prevention as indicated  Patient and his caregiver/ spouse will continue communicating with [facility] CSW regarding options around ALF's available to them and possibly for strategies of how to respond to patient's ongoing teariness, crying, and delusions associated with progression of Parkinson's Disease   Patient/ spouse will promptly notify patient's providers for any new concerns, issues, or problems  [facility] CM outreach to continue with scheduled [facility] RN CM phone call in 2 weeks.  Oneta Rack, RN, BSN, [employer] [facility] Care Management  [phone]

## 2016-11-15 ENCOUNTER — Ambulatory Visit (HOSPITAL_COMMUNITY)
Admission: RE | Admit: 2016-11-15 | Discharge: 2016-11-15 | Disposition: A | Discharge status: Home / Self Care | Payer: Medicare Other | Source: Ambulatory Visit | Attending: Cardiovascular Disease | Admitting: Cardiovascular Disease

## 2016-11-15 DIAGNOSIS — Z87891 Personal history of nicotine dependence: Secondary | ICD-10-CM | POA: Diagnosis not present

## 2016-11-15 DIAGNOSIS — M722 Plantar fascial fibromatosis: Secondary | ICD-10-CM | POA: Insufficient documentation

## 2016-11-15 DIAGNOSIS — I999 Unspecified disorder of circulatory system: Secondary | ICD-10-CM | POA: Insufficient documentation

## 2016-11-15 DIAGNOSIS — I10 Essential (primary) hypertension: Secondary | ICD-10-CM | POA: Insufficient documentation

## 2016-11-15 DIAGNOSIS — I739 Peripheral vascular disease, unspecified: Secondary | ICD-10-CM | POA: Diagnosis not present

## 2016-11-15 DIAGNOSIS — E785 Hyperlipidemia, unspecified: Secondary | ICD-10-CM | POA: Diagnosis not present

## 2016-11-15 LAB — CUP PACEART REMOTE DEVICE CHECK
Battery Remaining Longevity: 82 mo
Battery Remaining Percentage: 95.5 %
Battery Voltage: 2.99 V
Brady Statistic AP VP Percent: 70 %
Brady Statistic RA Percent Paced: 69 %
Date Time Interrogation Session: 20180706133522
Implantable Lead Implant Date: 20131004
Implantable Lead Implant Date: 20170713
Implantable Lead Location: 753858
Implantable Lead Location: 753860
Implantable Lead Model: 5076
Implantable Pulse Generator Implant Date: 20170713
Lead Channel Impedance Value: 400 Ohm
Lead Channel Pacing Threshold Amplitude: 1 V
Lead Channel Pacing Threshold Amplitude: 1 V
Lead Channel Pacing Threshold Pulse Width: 0.4 ms
Lead Channel Sensing Intrinsic Amplitude: 2.4 mV
Lead Channel Setting Pacing Amplitude: 2.375
Lead Channel Setting Pacing Amplitude: 2.5 V
Lead Channel Setting Pacing Pulse Width: 0.4 ms
Lead Channel Setting Pacing Pulse Width: 1 ms
Lead Channel Setting Sensing Sensitivity: 4 mV
MDC IDC LEAD IMPLANT DT: 20131004
MDC IDC LEAD LOCATION: 753859
MDC IDC MSMT LEADCHNL LV IMPEDANCE VALUE: 800 Ohm
MDC IDC MSMT LEADCHNL LV PACING THRESHOLD AMPLITUDE: 1.875 V
MDC IDC MSMT LEADCHNL LV PACING THRESHOLD PULSEWIDTH: 1 ms
MDC IDC MSMT LEADCHNL RV IMPEDANCE VALUE: 390 Ohm
MDC IDC MSMT LEADCHNL RV PACING THRESHOLD PULSEWIDTH: 0.4 ms
MDC IDC MSMT LEADCHNL RV SENSING INTR AMPL: 12 mV
MDC IDC SET LEADCHNL RA PACING AMPLITUDE: 2 V
MDC IDC STAT BRADY AP VS PERCENT: 1 %
MDC IDC STAT BRADY AS VP PERCENT: 29 %
MDC IDC STAT BRADY AS VS PERCENT: 1 %
Pulse Gen Model: 3262
Pulse Gen Serial Number: 7933792

## 2016-11-17 ENCOUNTER — Other Ambulatory Visit: Payer: Self-pay | Admitting: *Deleted

## 2016-11-17 ENCOUNTER — Encounter: Payer: Self-pay | Admitting: *Deleted

## 2016-11-17 NOTE — Patient Outreach (Signed)
[facility]) Care Management [place] Telephone Outreach  11/17/2016  Ian [name] 07/30/1928 [id number]  Unsuccessful telephone outreach to Ian [name] an 83 y.o.malereferred to [place] from [facility] telephonic RN CM for evaluation and assessment of self-health management needs for chronic disease conditions and multiple falls. Patient has history including, but not limited to, Parkinson's disease, CAD with pacemaker placement in 2017, AV stenosis (with AVR in 2011), bradycardia, combined CHF, and GERD.   HIPAA compliant voice mail message left for patient, requesting return call back.  Plan:  Will re-attempt [place] telephone outreach later this week if I do not hear back from patient first.  Oneta Rack, RN, BSN, [place] Coordinator [facility] Care Management  [phone]

## 2016-11-19 ENCOUNTER — Other Ambulatory Visit: Payer: Self-pay | Admitting: *Deleted

## 2016-11-19 ENCOUNTER — Encounter: Payer: Self-pay | Admitting: *Deleted

## 2016-11-19 NOTE — Patient Outreach (Signed)
[facility]) Care Management [place] Telephone Outreach  11/19/2016  Ian [name] 1929/02/13 [id number]  Successful telephone outreach to Ian [name], wife/ primary caregiver of Ian [name], 83 y.o.malereferred to [place] from [facility] telephonic RN CM for evaluation and assessment of self-health management needs for chronic disease conditions and multiple falls. Patient has history including, but not limited to, Parkinson's disease, CAD with pacemaker placement in 2017, AV stenosis (with AVR in 2011), bradycardia, combined CHF, and GERD. HIPAA/ identity verified with patient's caregiver/ spouse, on [facility] CM written consent, during phone call today.   Today, patient's spouse reports that patient is "doing okay, nothing has changed."  Ian [name] denies pain/ concerns outside of patient's "normal," and she states patient is in no distress.  Medications: -- reports has all medicationsand takes as prescribed;denies questions about current medications.  -- reports have started new medication recently prescribed during neurology provider appointment, neudexta; wife reports that she "sees no real change" in patient's condition after starting medication -- reports wife continues managing all of patient's medications using weekly pill planner box  Provider appointments: -- reports accurate understanding of upcoming scheduled provider appointments as compared to review of EMR -- attended scheduled vascular appointment 11/15/16, for "test to try to evaluate foot/ heel pain."  States waiting on results. -- wife continues to verbalize intent to attend all scheduled provider appointments, stating she will drive patient to all appointments.  Safety/ Mobility/ Falls: -- wife denies new/ recent falls   Social/ [employer] needs: -- patient continues to have private duty sitter on Tuesday's and Thursday's for 4 hours so that patient's wife is able to attend  church activities, hobbies, etc Ian [name]") -- patient and his wife regularly participate in exercise programs at their senior center and are active in church; reports not going to the fitness center as much lately around rainy weather -- Previously provided patient/ spouse with flyer for [place] for their review during Ian [name] initial home visit 10/27/16; wife confirms that she attended one of their sessions on Tuesday, November 09, 2016; reports found session "very helpful with good information."  States that she is hoping to go to more sessions at [name] "soon."  -- denies further needs around community resources today  Self-health management of chronic disease state of Parkinson's disease and CHF/ medication management: --primary plan of action is to attend regular neurology provider appointments and stay in contact with neurologist when needed for new concerns, compliance with overall plan of care; are now actively exploring options for the future around [place] with assistance from [place]. -- now monitoring/ recording daily weights and BP's "several times" every week; reports weight ranges over last 2 weeks from "139-144 lbs, with a weight today of "141 lbs." -- reiterated weight gain guidelines for self-health management of CHF, including guidelines to contact PCP/ cardiologist for weight gain >3 lbs overnight/ 5 lbs in one week; wife verbalizes good understanding of weight gain guidelines and CHF zones, stating that patient "has remained in green zone" since [facility] CM initial home visit 10/27/16.  Ian [name] deniesfurther issues, concerns, or problems today. I confirmed thatpatient/ wife havemy direct phone number, the main [place] CM office phone number, and the [facility] CM 24-hour nurse advice phone number should issues arise prior to next scheduled [place] outreach by phone in 2 weeks, as Ian [name] does not feel that another [facility] CCm home visit is  necessary since patient "is about the same,  without changes" since last [place] home visit.   Plan:  Patient will continue taking his medications as prescribed and will attend all scheduled provider appointments  Patient will continue monitoring and recording his daily weights and BP's several times every week  Patient will continue using assistive devices for fall prevention as indicated  Patient and his caregiver/ spouse will continue communicating with [facility] CSW regarding options around ALF's available to them and possibly for strategies of how to respond to patient's ongoing teariness, crying, and delusions associated with progression of Parkinson's Disease   Patient/ spouse will promptly notify patient's providers for any new concerns, issues, or problems  [facility] CM outreach to continue with scheduled [facility] RN CM phone callin 2 weeks.  Ian Rack, RN, BSN, [employer] [facility] Care Management  [phone]

## 2016-11-22 ENCOUNTER — Ambulatory Visit (INDEPENDENT_AMBULATORY_CARE_PROVIDER_SITE_OTHER): Payer: Medicare Other

## 2016-11-22 DIAGNOSIS — I5022 Chronic systolic (congestive) heart failure: Secondary | ICD-10-CM

## 2016-11-22 DIAGNOSIS — H04123 Dry eye syndrome of bilateral lacrimal glands: Secondary | ICD-10-CM | POA: Diagnosis not present

## 2016-11-22 DIAGNOSIS — Z961 Presence of intraocular lens: Secondary | ICD-10-CM | POA: Diagnosis not present

## 2016-11-22 DIAGNOSIS — H353132 Nonexudative age-related macular degeneration, bilateral, intermediate dry stage: Secondary | ICD-10-CM | POA: Diagnosis not present

## 2016-11-22 DIAGNOSIS — Z950 Presence of cardiac pacemaker: Secondary | ICD-10-CM | POA: Diagnosis not present

## 2016-11-22 DIAGNOSIS — H52203 Unspecified astigmatism, bilateral: Secondary | ICD-10-CM | POA: Diagnosis not present

## 2016-11-23 ENCOUNTER — Telehealth: Payer: Self-pay

## 2016-11-23 NOTE — Progress Notes (Signed)
EPIC Encounter for ICM Monitoring  Patient Name: Ian [name] is a 83 y.o. male Date: 11/23/2016 Primary Care Physican: Venia Carbon, MD Primary Cardiologist: Gwenlyn Found Electrophysiologist: [facility] Dry Weight:143 lbs  Bi-V Pacing: >99%      Spoke with wife. Heart Failure questions reviewed, pt asymptomatic.   Thoracic impedance just below baseline suggesting fluid accumulation.  Prescribed dosage: Furosemide 80 mg 1 tablet daily and adds .5tablet if need. Potassium 10 mEq 2tablets by mouth daily and adds 1 tablet if additional Furosemide is taken.   Labs: 09/14/2016 Creatinine 0.84, BUN 18, Potassium 4.8, Sodium 142, EGFR 80-92 11/27/2017Creatinine 0.75, BUN 12, Potassium 4.3, Sodium 136 01/09/2016 Creatinine 0.67, BUN 15, Potassium 4.0, Sodium 135, EGFR >60  01/07/2016 Creatinine 0.79, BUN 21, Potassium 4.2, Sodium 139 01/06/2016 Creatinine 0.82, BUN 19, Potassium 4.0, Sodium 139 12/18/2015 Creatinine 0.94, BUN 14, Potassium 4.5, Sodium 142 10/23/2015 Creatinine 0.89, BUN 17, Potassium 4.3, Sodium 139 09/23/2015 Creatinine 0.73, BUN 14, Potassium 5.2, Sodium 138  Recommendations: No changes.  Encouraged to call for fluid symptoms.  Follow-up plan: ICM clinic phone appointment on 12/23/2016.    Copy of ICM check sent to [facility].   3 month ICM trend: 11/23/2016   1 Year ICM trend:      Rosalene Billings, RN 11/23/2016 9:51 AM

## 2016-11-23 NOTE — Telephone Encounter (Signed)
Call to patient's wife and requested to send remote ICM transmission today for review.

## 2016-11-24 NOTE — Progress Notes (Signed)
Thank you x 5 patients MCr

## 2016-11-29 ENCOUNTER — Telehealth: Payer: Self-pay | Admitting: Neurology

## 2016-11-29 NOTE — Telephone Encounter (Signed)
Left message on machine for patient to call back.   To see how he is doing on Nuedexta.

## 2016-11-30 NOTE — Telephone Encounter (Signed)
Patient's wife called back- she states that patient has not had crying spells on Nuedexta but hasn't done anything else.   She wants patient to try Seroquel again. She states, "Even if he doesn't want to take it, I want him to take it."  Patient is not sleeping. When he was on Seroquel he was sleeping through the night every night. It didn't help with hallucinations, but she feels he was overall better when he was sleeping.   She does have healthcare power of attorney.   He had stopped Seroquel due to "throbbing in arms/legs".   Please advise.

## 2016-11-30 NOTE — Telephone Encounter (Signed)
Left message on machine for patient's wife to call back.   

## 2016-11-30 NOTE — Telephone Encounter (Signed)
Just to be clear, the nuedexta was not to help the hallucinations but it was to help the crying and mood.  I understand that she wants him to go back on seroquel but before we do that does the patient feel better on nuedexta or not?  If not, go ahead and d/c and restart seroquel, 25 mg, 1/2 tablet at night for a week, then 1 tablet at night and then call in 2 weeks as will likely need day dosages

## 2016-12-01 ENCOUNTER — Encounter: Payer: Self-pay | Admitting: *Deleted

## 2016-12-01 ENCOUNTER — Ambulatory Visit (INDEPENDENT_AMBULATORY_CARE_PROVIDER_SITE_OTHER): Payer: Medicare Other | Admitting: Podiatry

## 2016-12-01 ENCOUNTER — Other Ambulatory Visit: Payer: Self-pay | Admitting: *Deleted

## 2016-12-01 ENCOUNTER — Telehealth: Payer: Self-pay | Admitting: Neurology

## 2016-12-01 ENCOUNTER — Encounter: Payer: Self-pay | Admitting: Podiatry

## 2016-12-01 DIAGNOSIS — M722 Plantar fascial fibromatosis: Secondary | ICD-10-CM

## 2016-12-01 DIAGNOSIS — I999 Unspecified disorder of circulatory system: Secondary | ICD-10-CM | POA: Diagnosis not present

## 2016-12-01 DIAGNOSIS — I701 Atherosclerosis of renal artery: Secondary | ICD-10-CM | POA: Diagnosis not present

## 2016-12-01 MED ORDER — QUETIAPINE FUMARATE 25 MG PO TABS: 25.0000 mg | ORAL_TABLET | Freq: Every day | ORAL | 1 refills | Status: DC

## 2016-12-01 NOTE — Telephone Encounter (Signed)
Spoke with patient's wife. No effect on mood.  They will stop Nuedexta and restart Seroquel as instructed. RX sent. Will call in two weeks to check on patient.

## 2016-12-01 NOTE — Patient Outreach (Deleted)
[facility]) Care Management  12/01/2016  Ian [name] 08-Feb-1929 [id number]   Phone call to patient's spouse to follow up on discharge plan.  Per patient's spouse, the plan is still for patient to discharge on Friday. Therapy is is working with patient on navigating stairs as he has stairs leading up to his front door at home which will determine his discharge date. Patient's spouse discussed concern regarding her ability to provide 24 hour care for patient. ALF placement discussed, however per spouse, they cannot afford this and they do not qualify for Medicaid. This Education officer, museum explored requesting the helpmfrom family and friends as well as Conservation officer, nature a private duty aid a few hours a day. Patient's spouse agrees with this plan but per patient's spouse, she does not have anyone to help her and she is not sure if they can afford a private duty aid.  At this point. patient's spouse is willing to take patient home, with [facility] through [place] and will see how much she can handle and will go from there.     Plan: RNCM informed of potential discharge for for 12/03/16 for transition of care.   Sheralyn Boatman [facility] Care Management [phone]

## 2016-12-01 NOTE — Telephone Encounter (Signed)
Tried to call back with no answer  

## 2016-12-01 NOTE — Patient Outreach (Signed)
Request received from Chrystal Land, LCSW to mail patient personal care resources.  Information mailed today. 

## 2016-12-01 NOTE — Telephone Encounter (Signed)
Ian [name] lmom returning a call to the office from yesterday. Thanks

## 2016-12-02 NOTE — Progress Notes (Signed)
Subjective:    Patient ID: Ian [name], male   DOB: 83 y.o.   MRN: [id number]   HPI patient states she's improved some discomfort still upon deep palpation    ROS      Objective:  Physical Exam Neurovascular status intact with patient's heel doing well with discomfort of a mild nature but improved overall    Assessment:   Doing well with fasciitis right      Plan:   At this point we're unable to pull up his circulatory studies but he is seen Dr. Alvester Chou so he will be able to take care of there's any issues but we did not so far seen indications of issues. I reviewed continued conservative care

## 2016-12-06 ENCOUNTER — Encounter: Payer: Self-pay | Admitting: *Deleted

## 2016-12-06 ENCOUNTER — Other Ambulatory Visit: Payer: Self-pay | Admitting: *Deleted

## 2016-12-06 NOTE — Patient Outreach (Signed)
Ian [name] [facility]) Care Management [place] Telephone Outreach    12/06/2016  JUSITN SALSGIVER [birth year] [id number]  Successful telephone outreach to Ian [name], wife/ primary caregiver of Ian [name], 83 y.o.malereferred to [place] from [facility] telephonic RN CM for evaluation and assessment of self-health management needs for chronic disease conditions and multiple falls. Patient has history including, but not limited to, Parkinson's disease, CAD with pacemaker placement in 2017, AV stenosis (with AVR in 2011), bradycardia, combined CHF, and GERD. HIPAA/ identity verified with patient's caregiver/ spouse, on [facility] CM written consent, during phone call today.   Today, patient's spousereports that patientis "doing okay, not alot has changed;" however, Barbarareports that she believes that she can tell that patient's dementia "is worsening," stating that he seems at times more confused and having increased hallucinations; reports that she has spoken with provider office staff at Dr. Doristine Devoid office and that medications were changed, although she has not yet been able to see any improvement in patient's overall condition.  Ian [name] denies that patient is in pain/ distress.  Medications: -- reports has all medicationsand takes as prescribed;denies questions about current medications.  -- reports trial of neudexta "had no effect;" wife reports that this medication has recently been discontinued and Seroquel was resumed at that time; verbalizes accurate reporting of scheduling/ dosing of seroquel -- reports wife continues managingall of patient'smedications using weekly pill planner box  Provider appointments: -- reports accurate understanding of upcoming scheduled provider appointments as compared to review of EMR -- wife continues to verbalize intent to attend all scheduled provider appointments, stating she will drive patient to all appointments.  Safety/  Mobility/ Falls: -- wife reports one new/ recent fall "last week," where patient became confused and thought that the coffee table in their home was a chair, and fell; reports no injury post-fall, states she was able to assist patient in getting up.  Fall risks/ prevention education reiterated.   Social/ Ian [name] needs: -- patient continues to have private duty sitter on Tuesday's and Thursday's for 4 hours so that patient's wife is able to attend church activities, hobbies, etc Ian [name]") -- patient and his wife continue regularly participate in exercise programs at their senior center and are active in church -- reports that she received information on assisted Living facilities from [place] "today;" reports that she and patient have visited several communities in preparation for future planing of patient's care needs, and verbalizes plans to continue visiting facilities in the future -- denies further needs around community resources today  Self-health management of chronic disease state of Parkinson's disease and CHF/ medication management: --primary plan of action is to attend regular neurology provider appointments and stay in contact with neurologist when needed for new concerns, compliance with overall plan of care; are now actively exploring options for the future around [place]  -- now monitoring/ recording dailyweights and BP's "several times" every week; reports weight ranges over last 2 weeks from "140-142 lbs, with a weight today of "141 lbs." -- reiterated weight gain guidelines for self-health management of CHF, including guidelines to contact PCP/ cardiologist for weight gain >3 lbs overnight/ 5 lbs in one week; wife verbalizes good understanding of weight gain guidelines and CHF zones, stating that patient "has remained in green zone" since [facility] CM initial home visit 10/27/16.  Barbaradeniesfurther issues, concerns, or problems today. I confirmed  thatpatient/ wife havemy direct phone number, the main [facility] CM office phone number, and the [facility]  CM 24-hour nurse advice phone number should issues arise prior to next scheduled [place] outreach with home visit next week, as Ian [name] would like to have a nurse assess patient, given her belief that patient's dementia is progressing.  Plan:  Patient will continue taking his medications as prescribed and will attend all scheduled provider appointments  Patient will continue monitoring and recording his daily weights and BP's several times every week  Patient will continue using assistive devices for fall prevention as indicated  Patient and his caregiver/ spouse will continue communicating with [facility] CSW regarding options around ALF's available to them and possibly forstrategies of how to respond to patient's ongoing teariness, crying, and delusions associated with progression of Parkinson's Disease   Patient/ spouse will promptly notify patient's providers for any new concerns, issues, or problems  THN CM outreach to continue with scheduled THN RN CM home visit nextweek.  Ian Rack, RN, BSN, [employer] [facility] Care Management  [phone]

## 2016-12-09 ENCOUNTER — Encounter: Payer: Self-pay | Admitting: *Deleted

## 2016-12-09 NOTE — Telephone Encounter (Signed)
This encounter was created in error - please disregard.

## 2016-12-09 NOTE — Addendum Note (Signed)
Addended by: Vern Claude on: 12/09/2016 10:47 AM   Modules accepted: Level of Service, SmartSet

## 2016-12-15 ENCOUNTER — Telehealth: Payer: Self-pay | Admitting: Neurology

## 2016-12-15 ENCOUNTER — Other Ambulatory Visit: Payer: Self-pay | Admitting: *Deleted

## 2016-12-15 ENCOUNTER — Encounter: Payer: Self-pay | Admitting: *Deleted

## 2016-12-15 NOTE — Patient Outreach (Signed)
[name]) Care Management  [place] Routine Home Visit 12/15/2016  Ian [name] [birth year] [id number]  Ian [name] is a 83 y.o. male referred to [place] from [name] telephonic RN CM for evaluation and assessment of self-health management needs for chronic disease conditions and multiple falls. Patient has history including, but not limited to, Parkinson's disease, CAD with pacemaker placement in 2017, AV stenosis (with AVR in 2011), bradycardia, combined CHF, and GERD. HIPAA/ identity verified in person today with patient and his wife/ caregiver, on [facility] CM written consent, is also present for today's visit and actively participates in visit.  Pleasant 75 minute home visit.  Subjective: "I really haven't thought about goals for my care.... I know that only God can help me now."  Assessment:  Ian [name] remains frustrated around management of ongoing symptoms of parkinson's disease, which he and his wife/ caregiver believes has continued to progress.  Ian [name] continues to be occasionally teary throughout today's home visit, although this appears to be less so than the last [name] home visit several weeks ago.  Ian [name] continues to admit that he feels depressed/ sad most of the time, and has ongoing periods of delusions/ hallucinations.  Today, Ian [name] a significant amount of hopelessness around his chronic disease state of Parkinson's Disease, stating that he believes "no one can help" him with his ongoing symptoms which cause both he and his wife to feel anxious.  For now, Ian [name] has continued with his normal activities as much as allowed.  Ian [name] continues to rely on his wife for all of his care needs, and he has supportive sons that assist as allowed.  Both [name] are interested in knowing more about options available to them around assisted living facilities for consideration for the future, and they have actively been  visiting ALF's as they begin navigating this process.  [name] remains frustrated and fatigued with her role as primary caregiver for her husband, but has recently been attending caregiver classes/ support programs through [name].  Ian [name] continues having very well established practices in place for self-health management of chronic disease state of CHF and he regularly exercises and is compliant with all aspects of his overall plan of care.  Ian [name] is a high fall risk.  Today, patientreports that patientis "doing about the same, not alot has changed;" however, Ian [name] to report that she believes that she can tell that patient's dementia "is worsening," stating that he seems at times more confused and having increased hallucinations.  Patient denies pain and is no distress throughout today's [name] home visit.  Medications: -- reports has all medicationsand takes as prescribed;denies questions about current medications.  -- again reports trial of neudexta "had no effect;" this medication has recently been discontinued and Seroquel was resumed.  Verbalizes accurate reporting of purpose/scheduling/ dosing of all medications -- reports wife continues Financial controller of patient'smedications using weekly pill planner box; reminds patient as necessary when to take his medications -- patient is able to take his medications independently and verbalizes no issues with swallowing medications  Provider appointments: -- reportsaccurate understanding of upcoming scheduled provider appointments as compared to review of EMR -- wife continues to verbalize intent to attend all scheduled provider appointments, stating she will drive patient to all appointments.  Safety/ Mobility/ Falls: -- reported last week that patient had one new/ recent fall 2 weeks ago; denies new falls since then; patient  continues using cane for assistance with ambulation as needed, "especially  when" patient goes outside of his home -- Gait is steady and purposeful today in his home without use of assistive devices -- Fall risks/ prevention education reiterated.  Social/ [employer] needs: -- patient continues to have private duty sitter on Tuesday's and Thursday's for 4 hours so that patient's wife is able to attend church activities, hobbies, etc Ian [name]") -- patient and his wife continue regularly participate in exercise programs at their senior center and are active in church -- confirms that they received information on assisted Living facilities from [name], and that they have visited several ALF communities in preparation for future planing of patient's (and spouse's) care needs; reports that thus far they have narrowed down their top choices to [name] and [name] -- Wife Ian [name] confirms that she has continued attending caregiver support groups and informational classes through [name] -- denies further needs around community resources today  Self-health management of chronic disease state of Parkinson's disease and CHF/ medication management: --primary plan of action is to attend regular neurology provider appointments and stay in contact with neurologist when needed for new concerns, compliance with overall plan of care; are now actively exploringoptions for the future around [place]  -- I had a pointed conversation with patient and spouse today around patient's goals of care for his Parkinson's disease, and patient was not able to verbalize goals, and voiced a degree of hopelessness around his Parkinson's Disease progression.  Patient reported his only goal to be "that my wife is happy." -- Discussed possibility of patient/ spouse obtaining information on [facility] through his neurology provider -- now monitoring/ recording dailyweights and BP's "several times" every week; reports weight ranges over last 2 weeks  from "137-142 lbs, with a weight today of "138lbs." -- reiterated weight gain guidelines for self-health management of CHF, including guidelines to contact PCP/ cardiologist for weight gain >3 lbs overnight/ 5 lbs in one week; wife verbalizes good understanding of weight gain guidelines and CHF zones, stating that patient "has remained in green zone" since [facility] CM initial home visit 10/27/16.  Patient and his spousedenyfurther issues, concerns, or problems today. I confirmed thatpatient/ wife havemy direct phone number, the main [facility] CM office phone number, and the [facility] CM 24-hour nurse advice phone number should issues arise prior to next scheduled [place] outreach with scheduled phone call in 3 weeks.  Objective:    BP 108/64   Pulse 74   Resp 16   Wt 138 lb (62.6 kg)   SpO2 98%   BMI 21.94 kg/m    Review of Systems  Constitutional: Negative.   Respiratory: Negative.  Negative for cough and shortness of breath.   Cardiovascular: Negative.  Negative for chest pain, palpitations and leg swelling.  Gastrointestinal: Negative.  Negative for abdominal pain and nausea.  Genitourinary: Negative.   Musculoskeletal: Positive for falls.       Fall without significant injury reported "about 3 weeks ago."  Patient uses cane as needed; gait steady and purposeful in his home today without use of assistive devices  Neurological: Positive for tremors.       Patient has significant tremors of bilateral UE's related to chronic disease state of Parkinson's   Psychiatric/Behavioral: Positive for depression. The patient is not nervous/anxious.     Physical Exam  Constitutional: He is oriented to person, place, and time. He appears well-developed and well-nourished. No distress.  Cardiovascular: Normal  rate, regular rhythm, normal heart sounds and intact distal pulses.   Respiratory: Effort normal and breath sounds normal. No respiratory distress. He has no wheezes. He has no rales.  GI:  Soft. Bowel sounds are normal.  Musculoskeletal: He exhibits no edema.  Neurological: He is alert and oriented to person, place, and time.  Skin: Skin is warm and dry. No erythema.  Psychiatric: He has a normal mood and affect. His behavior is normal. Judgment normal.  Patient able to answer all questions around orientation, however, has difficulty with idea formation and expression of thought content beyond "yes" and "no" questions    Plan:  Patient will continue taking his medications as prescribed and will attend all scheduled provider appointments  Patient will continue monitoring and recording his daily weights and BP's several times every week  Patient will continue using assistive devices for fall prevention as indicated  Patient and his caregiver/ spouse will continue reviewing options around ALF's   Patient/ spouse will promptly notify patient's providers for any new concerns, issues, or problems  THN CM outreach to continue with scheduled [facility] RN CM phone call in 3weeks.  THN CM Care Plan Problem One     Most Recent Value  Care Plan Problem One  Need for ongoing reinforcement of self-health management of chronic disease states of Parkinson's disease and CHF  Role Documenting the Problem One  Care Management Coordinator  Care Plan for Problem One  Active  THN Long Term Goal   Over the next 60 days, patient and spouse will be able to verbalize action plan for ongoing self-health management for chronic disease state of Parkinson's Disease, as evidenced by patient/ spouse reporting during [facility] RN CM outreach  [facility] Long Term Goal Start Date  10/18/16  Interventions for Problem One Long Term Goal  Using teachback method, discussed with patient's spouse current clinical status of patient, discussed options for future care/ living situation/ ALF placement, and discussed patient's current general plan of care for chronic disease state of Parkinson's and CHF      Oneta Rack, RN, BSN, [employer] Coordinator [facility] Care Management  [phone]

## 2016-12-15 NOTE — Telephone Encounter (Signed)
Left message on machine for patient to call back.  To see how he is doing on Seroquel. Awaiting call back.

## 2016-12-16 MED ORDER — QUETIAPINE FUMARATE 25 MG PO TABS: 25.0000 mg | ORAL_TABLET | Freq: Every day | ORAL | 1 refills | Status: DC

## 2016-12-16 NOTE — Telephone Encounter (Signed)
Patient's wife called back and states he is doing well on Seroquel 25 mg at night. Hallucinations are the same, but sleep and mood are much better. No side effects on this dose. RX sent to [facility] per request.

## 2016-12-23 ENCOUNTER — Ambulatory Visit (INDEPENDENT_AMBULATORY_CARE_PROVIDER_SITE_OTHER): Payer: Medicare Other

## 2016-12-23 ENCOUNTER — Telehealth: Payer: Self-pay

## 2016-12-23 DIAGNOSIS — I5022 Chronic systolic (congestive) heart failure: Secondary | ICD-10-CM

## 2016-12-23 DIAGNOSIS — Z950 Presence of cardiac pacemaker: Secondary | ICD-10-CM | POA: Diagnosis not present

## 2016-12-23 NOTE — Progress Notes (Signed)
EPIC Encounter for ICM Monitoring  Patient Name: Ian [name] is a 83 y.o. male Date: 12/23/2016 Primary Care Physican: Venia Carbon, MD Primary Cardiologist: Gwenlyn Found Electrophysiologist: Croitoru Dry Weight:139.8 lbs Bi-V Pacing: >99%      Spoke with wife. Heart Failure questions reviewed, pt generally feeling sluggish.   Thoracic impedance abnormal suggesting fluid accumulation from 8/27 to 9/4 and starting to trend below baseline again today.   Prescribed dosage: Furosemide 80 mg 1 tablet daily and adds .5tablet if need. Potassium 10 mEq 2tablets (20 mEq) by mouth daily and adds 1 tablet if additional Furosemide is taken.   Labs: 09/14/2016 Creatinine 0.84, BUN 18, Potassium 4.8, Sodium 142, EGFR 80-92 11/27/2017Creatinine 0.75, BUN 12, Potassium 4.3, Sodium 136 01/09/2016 Creatinine 0.67, BUN 15, Potassium 4.0, Sodium 135, EGFR >60  01/07/2016 Creatinine 0.79, BUN 21, Potassium 4.2, Sodium 139 01/06/2016 Creatinine 0.82, BUN 19, Potassium 4.0, Sodium 139 12/18/2015 Creatinine 0.94, BUN 14, Potassium 4.5, Sodium 142 10/23/2015 Creatinine 0.89, BUN 17, Potassium 4.3, Sodium 139 09/23/2015 Creatinine 0.73, BUN 14, Potassium 5.2, Sodium 138  Recommendations: Patient did not take Furosemide yesterday which would correlate with decreased impedance today.  Wife stated he took 80 mg of Furosemide today and if needed he will take extra 1/2 tablet.  Advised to call if he has fluid symptoms that are not being relieved by the extra 40 mg Furosemide  Follow-up plan: ICM clinic phone appointment on 12/31/2016 to recheck fluid levels.    Copy of ICM check sent to Dr. Sallyanne Kuster and Dr. Gwenlyn Found.   3 month ICM trend: 12/23/2016   1 Year ICM trend:      Rosalene Billings, RN 12/23/2016 1:49 PM

## 2016-12-23 NOTE — Telephone Encounter (Signed)
Spoke with pt and reminded pt of remote transmission that is due today.  He said his wife would be home around 2pm and they will send it after that time.

## 2016-12-28 ENCOUNTER — Telehealth: Payer: Self-pay | Admitting: Internal Medicine

## 2016-12-28 NOTE — Telephone Encounter (Signed)
Pt's wife dropped off form to be filled out. Placed in [place] tower

## 2016-12-30 DIAGNOSIS — Z7689 Persons encountering health services in other specified circumstances: Secondary | ICD-10-CM

## 2016-12-30 NOTE — Telephone Encounter (Signed)
Form done Send along with copy of last visit and labs

## 2016-12-31 ENCOUNTER — Ambulatory Visit (INDEPENDENT_AMBULATORY_CARE_PROVIDER_SITE_OTHER): Payer: Self-pay

## 2016-12-31 DIAGNOSIS — Z950 Presence of cardiac pacemaker: Secondary | ICD-10-CM

## 2016-12-31 DIAGNOSIS — I5022 Chronic systolic (congestive) heart failure: Secondary | ICD-10-CM

## 2016-12-31 NOTE — Progress Notes (Signed)
EPIC Encounter for ICM Monitoring  Patient Name: Ian [name] is a 83 y.o. male Date: 12/31/2016 Primary Care Physican: Venia Carbon, MD Primary Cardiologist: Gwenlyn Found Electrophysiologist: Croitoru Dry Weight:138lbs Bi-V Pacing: >99%      Spoke with wife. Heart Failure questions reviewed, pt asymptomatic.  She said the feeling of chest fullness has resolved.   Thoracic impedance returned to normal after taking extra 1/2 tablet of Furosemide.  Prescribed dosage: Furosemide 80 mg 1 tablet daily and adds .5tablet if need. Potassium 10 mEq 2tablets (20 mEq) by mouth daily and adds 1 tablet if additional Furosemide is taken.   Labs: 09/14/2016 Creatinine 0.84, BUN 18, Potassium 4.8, Sodium 142, EGFR 80-92 11/27/2017Creatinine 0.75, BUN 12, Potassium 4.3, Sodium 136 01/09/2016 Creatinine 0.67, BUN 15, Potassium 4.0, Sodium 135, EGFR >60  01/07/2016 Creatinine 0.79, BUN 21, Potassium 4.2, Sodium 139 01/06/2016 Creatinine 0.82, BUN 19, Potassium 4.0, Sodium 139 12/18/2015 Creatinine 0.94, BUN 14, Potassium 4.5, Sodium 142 10/23/2015 Creatinine 0.89, BUN 17, Potassium 4.3, Sodium 139 09/23/2015 Creatinine 0.73, BUN 14, Potassium 5.2, Sodium 138  Recommendations: No changes.   Encouraged to call for fluid symptoms.  Follow-up plan: ICM clinic phone appointment on 01/24/2017.    Copy of ICM check sent to Dr. Sallyanne Kuster.   3 month ICM trend: 12/31/2016   1 Year ICM trend:     Rosalene Billings, RN 12/31/2016 4:00 PM

## 2016-12-31 NOTE — Progress Notes (Signed)
Thank you :)

## 2017-01-02 ENCOUNTER — Other Ambulatory Visit: Payer: Self-pay | Admitting: Cardiovascular Disease

## 2017-01-03 NOTE — Telephone Encounter (Signed)
Rx(s) sent to pharmacy electronically.  

## 2017-01-06 ENCOUNTER — Encounter: Payer: Self-pay | Admitting: *Deleted

## 2017-01-06 ENCOUNTER — Other Ambulatory Visit: Payer: Self-pay | Admitting: *Deleted

## 2017-01-06 NOTE — Patient Outreach (Signed)
[facility]) Care Management [place] Telephone [place], [facility] CM case closure  01/06/2017  Ian [name] March 12, 1929 [id number]  Successful telephone outreach to Ian [name], wife/ caregiver, on [facility] CM written consent, re: Ian [name] is a 83 y.o. male referred to [place] from [facility] telephonic RN CM for evaluation and assessment of self-health management needs for chronic disease conditions and multiple falls. Patient has history including, but not limited to, Parkinson's disease, CAD with pacemaker placement in 2017, AV stenosis (with AVR in 2011), bradycardia, combined CHF, and GERD. HIPAA/ identity verified with patient's wife today during phone call.  Today, patient's wife Ian [name] that patientis "doing about the same, but it seems that his Parkinson's is progressing quickly."  Ian [name] reports that she believes patient "is deteriorating quickly," stating that he has increased confusion, anxiety, and hallucinations."  Ian [name] reports "we are both struggling."  Ian [name] denies that patient is in acute distress; states, "it's just the parkinson's getting worse."     Ian [name] further reports: -- no changes or questions around patient's medications; patient has and is taking all medications as prescribed -- no recent or upcoming provider appointments; Ian [name] continues transporting patient to all provider appointments -- no recent or new falls, although Ian [name] reports that she believes patient is "less and less stable on his feet" due to progression of Parkinson's symptoms;  Fall risks/ prevention education reiterated. -- patient continues to have private duty sitter on Tuesday's and Thursday's for 4 hours; patient continues being resistant to this care assistance  -- have  they have completed application to [facility], and are currently being reviewed for acceptance; Ian [name] admits fear that patient may not qualify/ be accepted into Independent  living at this stage of his Parkinson's disease -- confirms that she has continued attending caregiver support groups and informational classes through [facility] -- denies further needs around community resources today -- continues monitoring/ recording dailyweights and BP's "several times" every week; reports weight ranges over last 2 weeks from "138-140lbs, with a weight today of "139lbs." Ian [name] is able to accurately verbalize weight gain guidelines for self-health management of CHF and CHF zones, reports patient "in green zone" since last [place] home visit last month. --primary plan of action continues to be to attend regular neurology provider appointments and stay in contact with neurologist when needed for new concerns, compliance with overall plan of care; following up onoptions around [place] deniesfurther issues, concerns, or problems today, and reports that she and patient will travelling to the beach with family "for the entire month of October."  We discussed that patient and caregiver have thus far met their previously established goals for self-health management of Parkinson's Disease, and today, given that their application to [facility] is in progress, as well as their upcoming October trip to the beach, Ian [name] agrees with [facility] CCM RN case closure. I confirmed thatpatient/ wife havemy direct phone number, the main [facility] CM office phone number, and the [facility] CM 24-hour nurse advice phone number should issues arise in the future.  Plan:  Will close patient's [facility] CM case, as patient's wife denies further care coordination needs, and will make [facility] CSW, CMA, and patient's PCP and neurology providers aware of same.  [facility] CM Care Plan Problem One     Most Recent Value  Care Plan Problem One  Need for ongoing reinforcement of self-health management of chronic disease states of Parkinson's disease and CHF  Role Documenting the  Problem One  Care Management Coordinator  Care Plan for Problem One  Not Active  [facility] Long Term Goal   Over the next 60 days, patient and spouse will be able to verbalize action plan for ongoing self-health management for chronic disease state of Parkinson's Disease, as evidenced by patient/ spouse reporting during [facility] RN CM outreach  [facility] Long Term Goal Start Date  10/18/16  [facility] Long Term Goal Met Date  01/06/17  Interventions for Problem One Long Term Goal  Using teachback method, discussed with patient's spouse current clinical status of patient, discussed options for future care/ living situation/ ALF placement, and discussed patient's current general plan of care for chronic disease state of Parkinson's and CHF      It has been a pleasure participating in Don's care,  Oneta Rack, RN, BSN, [employer] [facility] Care Management  [phone]

## 2017-01-07 ENCOUNTER — Encounter: Payer: Self-pay | Admitting: *Deleted

## 2017-01-14 ENCOUNTER — Encounter: Payer: Self-pay | Admitting: *Deleted

## 2017-01-20 DIAGNOSIS — R42 Dizziness and giddiness: Secondary | ICD-10-CM | POA: Diagnosis not present

## 2017-01-20 DIAGNOSIS — H938X9 Other specified disorders of ear, unspecified ear: Secondary | ICD-10-CM | POA: Diagnosis not present

## 2017-01-20 DIAGNOSIS — H6120 Impacted cerumen, unspecified ear: Secondary | ICD-10-CM | POA: Diagnosis not present

## 2017-01-24 ENCOUNTER — Ambulatory Visit (INDEPENDENT_AMBULATORY_CARE_PROVIDER_SITE_OTHER): Payer: Medicare Other | Admitting: *Deleted

## 2017-01-24 DIAGNOSIS — I442 Atrioventricular block, complete: Secondary | ICD-10-CM

## 2017-01-24 DIAGNOSIS — Z950 Presence of cardiac pacemaker: Secondary | ICD-10-CM | POA: Diagnosis not present

## 2017-01-24 DIAGNOSIS — I5022 Chronic systolic (congestive) heart failure: Secondary | ICD-10-CM | POA: Diagnosis not present

## 2017-01-24 NOTE — Telephone Encounter (Signed)
This encounter was created in error - please disregard.

## 2017-01-24 NOTE — Progress Notes (Signed)
EPIC Encounter for ICM Monitoring  Patient Name: Ian [name] is a 83 y.o. male Date: 01/24/2017 Primary Care Physican: Venia Carbon, MD Primary Cardiologist: Gwenlyn Found Electrophysiologist: Croitoru Dry Weight:Previous weight 138lbs Bi-V Pacing: >99%       Attempted call to wife.  Left detailed message regarding transmission.  Transmission reviewed.    Thoracic impedance normal but was abnormal suggesting fluid accumulation from 01/03/2017 to 01/11/2017.  Prescribed dosage: Furosemide 80 mg 1 tablet daily and adds .5tablet if need. Potassium 10 mEq 2tablets (20 mEq)by mouth daily and adds 1 tablet if additional Furosemide is taken.   Labs: 09/14/2016 Creatinine 0.84, BUN 18, Potassium 4.8, Sodium 142, EGFR 80-92 11/27/2017Creatinine 0.75, BUN 12, Potassium 4.3, Sodium 136 01/09/2016 Creatinine 0.67, BUN 15, Potassium 4.0, Sodium 135, EGFR >60  01/07/2016 Creatinine 0.79, BUN 21, Potassium 4.2, Sodium 139 01/06/2016 Creatinine 0.82, BUN 19, Potassium 4.0, Sodium 139 12/18/2015 Creatinine 0.94, BUN 14, Potassium 4.5, Sodium 142 10/23/2015 Creatinine 0.89, BUN 17, Potassium 4.3, Sodium 139 09/23/2015 Creatinine 0.73, BUN 14, Potassium 5.2, Sodium 138  Recommendations: Left voice mail with ICM number and encouraged to call if experiencing any fluid symptoms.  Follow-up plan: ICM clinic phone appointment on 02/24/2017.    Copy of ICM check sent to Dr. Sallyanne Kuster.   3 month ICM trend: 01/24/2017   1 Year ICM trend:      Rosalene Billings, RN 01/24/2017 2:02 PM

## 2017-01-25 NOTE — Progress Notes (Signed)
Received call from wife.  She reported they are at the beach and said patient feels some fullness in the chest.  She is going to give him extra Furosemide today as prescribed to see if that helps. Transmission reviewed.  She said she will call back if needed but right now he is okay.

## 2017-01-25 NOTE — Progress Notes (Signed)
Remote pacemaker transmission.   

## 2017-01-26 DIAGNOSIS — H6123 Impacted cerumen, bilateral: Secondary | ICD-10-CM | POA: Diagnosis not present

## 2017-01-26 DIAGNOSIS — H60502 Unspecified acute noninfective otitis externa, left ear: Secondary | ICD-10-CM | POA: Diagnosis not present

## 2017-01-27 ENCOUNTER — Encounter: Payer: Self-pay | Admitting: Cardiology

## 2017-02-08 ENCOUNTER — Telehealth: Payer: Self-pay | Admitting: Internal Medicine

## 2017-02-08 NOTE — Telephone Encounter (Signed)
Copied from [place] #[id number]. Topic: Inquiry >> Feb 07, 2017 11:50 AM Darl Householder, RMA wrote: Reason for CRM: pt's wife would like to speak directly to Dr Silvio Pate concerning husband's worsening condition, wife will be home tomorrow mid-afternoon if he can please call her at [phone].   No answer on either phone. Message left on cell phone

## 2017-02-08 NOTE — Telephone Encounter (Signed)
Copied from [place] #[id number]. Topic: Quick Communication - See Telephone Encounter >> Feb 08, 2017  1:46 PM Aurelio Brash B wrote: CRM for notification. See Telephone encounter for:  02/08/17. pt's wife would like to speak directly to Dr Silvio Pate concerning husband's worsening condition,   please call her at [phone], called yesterday and did not receive call back

## 2017-02-09 MED ORDER — QUETIAPINE FUMARATE 25 MG PO TABS: 25.0000 mg | ORAL_TABLET | Freq: Two times a day (BID) | ORAL | 0 refills | Status: DC

## 2017-02-09 NOTE — Telephone Encounter (Signed)
Spoke to wife He is becoming very difficult Delusions, hallucinations, behavior is hard for her to deal with They were admitted to [facility]

## 2017-02-09 NOTE — Telephone Encounter (Signed)
Psychotic symptoms are largely during the day. No Parkinson's meds other than sinemet and no other meds likely to be causing this. Will try increasing the quetiapine to bid

## 2017-02-09 NOTE — Telephone Encounter (Signed)
Thanks for the update

## 2017-02-10 LAB — CUP PACEART REMOTE DEVICE CHECK
Battery Remaining Percentage: 95.5 %
Battery Voltage: 2.99 V
Brady Statistic AP VS Percent: 1 %
Brady Statistic AS VP Percent: 28 %
Brady Statistic RA Percent Paced: 70 %
Implantable Lead Implant Date: 20131004
Implantable Lead Implant Date: 20131004
Implantable Lead Implant Date: 20170713
Implantable Lead Location: 753858
Implantable Lead Model: 5076
Lead Channel Impedance Value: 410 Ohm
Lead Channel Pacing Threshold Amplitude: 1 V
Lead Channel Sensing Intrinsic Amplitude: 2.7 mV
Lead Channel Setting Pacing Amplitude: 2.125
Lead Channel Setting Pacing Amplitude: 2.5 V
Lead Channel Setting Pacing Pulse Width: 1 ms
MDC IDC LEAD LOCATION: 753859
MDC IDC LEAD LOCATION: 753860
MDC IDC MSMT BATTERY REMAINING LONGEVITY: 86 mo
MDC IDC MSMT LEADCHNL LV IMPEDANCE VALUE: 850 Ohm
MDC IDC MSMT LEADCHNL LV PACING THRESHOLD AMPLITUDE: 1.625 V
MDC IDC MSMT LEADCHNL LV PACING THRESHOLD PULSEWIDTH: 1 ms
MDC IDC MSMT LEADCHNL RA IMPEDANCE VALUE: 410 Ohm
MDC IDC MSMT LEADCHNL RA PACING THRESHOLD PULSEWIDTH: 0.4 ms
MDC IDC MSMT LEADCHNL RV PACING THRESHOLD AMPLITUDE: 1 V
MDC IDC MSMT LEADCHNL RV PACING THRESHOLD PULSEWIDTH: 0.4 ms
MDC IDC MSMT LEADCHNL RV SENSING INTR AMPL: 12 mV
MDC IDC PG IMPLANT DT: 20170713
MDC IDC PG SERIAL: 7933792
MDC IDC SESS DTM: 20181008060014
MDC IDC SET LEADCHNL RA PACING AMPLITUDE: 2 V
MDC IDC SET LEADCHNL RV PACING PULSEWIDTH: 0.4 ms
MDC IDC SET LEADCHNL RV SENSING SENSITIVITY: 4 mV
MDC IDC STAT BRADY AP VP PERCENT: 72 %
MDC IDC STAT BRADY AS VS PERCENT: 1 %

## 2017-02-17 ENCOUNTER — Ambulatory Visit (INDEPENDENT_AMBULATORY_CARE_PROVIDER_SITE_OTHER): Payer: Medicare Other

## 2017-02-17 ENCOUNTER — Ambulatory Visit: Payer: Medicare Other | Admitting: Family Medicine

## 2017-02-17 DIAGNOSIS — Z23 Encounter for immunization: Secondary | ICD-10-CM

## 2017-02-24 ENCOUNTER — Ambulatory Visit (INDEPENDENT_AMBULATORY_CARE_PROVIDER_SITE_OTHER): Payer: Medicare Other

## 2017-02-24 ENCOUNTER — Telehealth: Payer: Self-pay | Admitting: Cardiology

## 2017-02-24 DIAGNOSIS — Z950 Presence of cardiac pacemaker: Secondary | ICD-10-CM

## 2017-02-24 DIAGNOSIS — I5022 Chronic systolic (congestive) heart failure: Secondary | ICD-10-CM

## 2017-02-24 NOTE — Telephone Encounter (Signed)
LMOVM reminding pt to send remote transmission.   

## 2017-02-25 NOTE — Progress Notes (Signed)
EPIC Encounter for ICM Monitoring  Patient Name: Ian [name] is a 83 y.o. male Date: 02/25/2017 Primary Care Physican: Venia Carbon, MD Primary Cardiologist: Gwenlyn Found Electrophysiologist: Croitoru Dry Weight:136lbs Bi-V Pacing: >99%       Spoke with wife.  Heart Failure questions reviewed, pt asymptomatic but does feel tired today.   [place]: Thoracic impedance starting to trend below baseline suggesting fluid accumulation on the day of transmission, 02/24/2017.  Prescribed dosage: Furosemide 80 mg 1 tablet daily and adds .5tablet if need. Potassium 10 mEq 2tablets (20 mEq)by mouth daily and adds 1 tablet if additional Furosemide is taken.   Labs: 09/14/2016 Creatinine 0.84, BUN 18, Potassium 4.8, Sodium 142, EGFR 80-92 11/27/2017Creatinine 0.75, BUN 12, Potassium 4.3, Sodium 136 01/09/2016 Creatinine 0.67, BUN 15, Potassium 4.0, Sodium 135, EGFR >60  01/07/2016 Creatinine 0.79, BUN 21, Potassium 4.2, Sodium 139 01/06/2016 Creatinine 0.82, BUN 19, Potassium 4.0, Sodium 139 12/18/2015 Creatinine 0.94, BUN 14, Potassium 4.5, Sodium 142 10/23/2015 Creatinine 0.89, BUN 17, Potassium 4.3, Sodium 139 09/23/2015 Creatinine 0.73, BUN 14, Potassium 5.2, Sodium 138  Recommendations: No changes.  Advised to take extra .5 tablet of Furosemide if he feels congested. She stated he will if needed.   Encouraged to call for fluid symptoms.  Follow-up plan: ICM clinic phone appointment on 03/29/2017.  Office appointment scheduled 04/06/2017 with Chanetta Marshall, NP.  Copy of ICM check sent to Dr. Sallyanne Kuster.   3 month ICM trend: 02/24/2017    1 Year ICM trend:       Rosalene Billings, RN 02/25/2017 11:39 AM

## 2017-02-28 ENCOUNTER — Ambulatory Visit (INDEPENDENT_AMBULATORY_CARE_PROVIDER_SITE_OTHER): Payer: Self-pay

## 2017-02-28 DIAGNOSIS — Z950 Presence of cardiac pacemaker: Secondary | ICD-10-CM

## 2017-02-28 DIAGNOSIS — I5022 Chronic systolic (congestive) heart failure: Secondary | ICD-10-CM

## 2017-02-28 NOTE — Progress Notes (Signed)
EPIC Encounter for ICM Monitoring  Patient Name: Ian [name] is a 83 y.o. male Date: 02/28/2017 Primary Care Physican: Venia Carbon, MD Primary Cardiologist: Gwenlyn Found Electrophysiologist: Croitoru Dry Weight:142lbs(was 136 lbs about 1 month ago) Bi-V Pacing: >99%           Returned call as requested by wife on voice mail.  Spoke with patient and he had increased shortness of breath yesterday and last night as well as swelling of legs/feet.  He said he is not feeling well today either.     [place]: Thoracic impedance but was abnormal suggesting fluid accumulation since 02/24/2017.  Prescribed dosage: Furosemide 80 mg 1 tablet daily.  Potassium 10 mEq 1tabletby mouth daily  Labs: 09/14/2016 Creatinine 0.84, BUN 18, Potassium 4.8, Sodium 142, EGFR 80-92 11/27/2017Creatinine 0.75, BUN 12, Potassium 4.3, Sodium 136 01/09/2016 Creatinine 0.67, BUN 15, Potassium 4.0, Sodium 135, EGFR >60  01/07/2016 Creatinine 0.79, BUN 21, Potassium 4.2, Sodium 139 01/06/2016 Creatinine 0.82, BUN 19, Potassium 4.0, Sodium 139 12/18/2015 Creatinine 0.94, BUN 14, Potassium 4.5, Sodium 142 10/23/2015 Creatinine 0.89, BUN 17, Potassium 4.3, Sodium 139 09/23/2015 Creatinine 0.73, BUN 14, Potassium 5.2, Sodium 138  Recommendations: Discussed limiting salt intake to 2000 mg daily.  Follow-up plan: ICM clinic phone appointment on 03/04/2017 to recheck fluid levels.    Copy of ICM check sent to Dr. Gwenlyn Found and Dr. Sallyanne Kuster for review and recommendations.   3 month ICM trend: 02/28/2017    1 Year ICM trend:       Rosalene Billings, RN 02/28/2017 1:21 PM

## 2017-03-01 NOTE — Progress Notes (Signed)
Attempted call to wife to provide recommendations and left message for return call.

## 2017-03-01 NOTE — Progress Notes (Signed)
  Received: Today  Message Contents  Croitoru, Mihai, MD  Dominik Yordy Panda, RN        Yes please  MCr   Previous Messages    ----- Message -----  From: Rosalene Billings, RN  Sent: 03/01/2017  9:13 AM  To: Sanda Klein, MD  Subject: RE: Fluid accumulation              Do you want him to do the extra dose until the transmission at the end of the week?        Received: Today  Message Contents  Croitoru, [place], MD  Alejandro Gamel Panda, RN        I would have him take an extra 40 mg of furosemide in the afternoon, in addition to the usual 80 mg in the morning and submit another transmission towards the end of the week. Thanks  [facility]

## 2017-03-01 NOTE — Progress Notes (Signed)
Wife returned call.  Advised Dr Sallyanne Kuster recommended to take Furosemide 80 mg in AM and 40 mg PM until next transmission which is 03/04/2017.  She verbalized understanding.

## 2017-03-03 NOTE — Progress Notes (Signed)
Sent to scheduling to arrange this appt.

## 2017-03-03 NOTE — Progress Notes (Signed)
Probably would benefit from mid-level provider office visit to see and evaluate

## 2017-03-04 ENCOUNTER — Ambulatory Visit (INDEPENDENT_AMBULATORY_CARE_PROVIDER_SITE_OTHER): Payer: Medicare Other

## 2017-03-04 ENCOUNTER — Ambulatory Visit (INDEPENDENT_AMBULATORY_CARE_PROVIDER_SITE_OTHER): Payer: Medicare Other | Admitting: Physician Assistant

## 2017-03-04 ENCOUNTER — Encounter: Payer: Self-pay | Admitting: Physician Assistant

## 2017-03-04 VITALS — BP 112/64 | HR 62 | Ht 66.5 in | Wt 147.0 lb

## 2017-03-04 DIAGNOSIS — I5022 Chronic systolic (congestive) heart failure: Secondary | ICD-10-CM

## 2017-03-04 DIAGNOSIS — I5032 Chronic diastolic (congestive) heart failure: Secondary | ICD-10-CM

## 2017-03-04 DIAGNOSIS — R42 Dizziness and giddiness: Secondary | ICD-10-CM | POA: Diagnosis not present

## 2017-03-04 DIAGNOSIS — I701 Atherosclerosis of renal artery: Secondary | ICD-10-CM

## 2017-03-04 DIAGNOSIS — Z79899 Other long term (current) drug therapy: Secondary | ICD-10-CM | POA: Diagnosis not present

## 2017-03-04 DIAGNOSIS — Z950 Presence of cardiac pacemaker: Secondary | ICD-10-CM

## 2017-03-04 DIAGNOSIS — I25810 Atherosclerosis of coronary artery bypass graft(s) without angina pectoris: Secondary | ICD-10-CM | POA: Diagnosis not present

## 2017-03-04 LAB — BASIC METABOLIC PANEL
BUN/Creatinine Ratio: 17 (ref 10–24)
BUN: 17 mg/dL (ref 8–27)
CALCIUM: 9.4 mg/dL (ref 8.6–10.2)
CHLORIDE: 95 mmol/L — AB (ref 96–106)
CO2: 31 mmol/L — AB (ref 20–29)
Creatinine, Ser: 0.98 mg/dL (ref 0.76–1.27)
GFR calc non Af Amer: 70 mL/min/{1.73_m2} (ref >59)
GFR, EST AFRICAN AMERICAN: 81 mL/min/{1.73_m2} (ref >59)
Glucose: 91 mg/dL (ref 65–99)
Potassium: 4.5 mmol/L (ref 3.5–5.2)
Sodium: 140 mmol/L (ref 134–144)

## 2017-03-04 MED ORDER — PANTOPRAZOLE SODIUM 40 MG PO TBEC: 40.0000 mg | DELAYED_RELEASE_TABLET | Freq: Every day | ORAL | 0 refills | Status: DC

## 2017-03-04 NOTE — Patient Instructions (Signed)
Suanne Marker, [place] recommends: -- lab work TODAY Artist) -- start pantoprazole 40mg  daily for 2 weeks (you will need to get the refills from PCP) -- stop prevacid -- OK to take extra 1/2 tablet of lasix and potassium daily for weight gain of 3lbs in 1 day or 5lbs in 1 week -- 2000mg  sodium limit daily -- 2L fluid limit intake daily -- monitor & record your symptoms

## 2017-03-04 NOTE — Progress Notes (Signed)
Ian [name] was seen today in the movement disorders clinic for f/u.  He is accompanied by his wife who supplements the history.  The first symptom(s) the patient noticed was tremor in the R hand when he would use the hand or have a cup of coffee in it.  His wife believes that this began in 2004.  He was referred to Dr. Tilden Dome.  He tried some medication and according to his wife, he kept having adverse SE.  He was on mirapex and sinemet but reported dizziness and bad dreams with both.   He was referred to Dr. Yevonne Pax in about 2010 because he was interested in DBS but the patient was concerned about risks of the surgery and potential side effects.    09/28/12  I tried to change the levodopa back to the IR formulation but he did not like it because he was too drowsy and went back to the CR formulation.  , 2 po qid and carbidopa/levodopa 50/200 at night.    He has continued to have more tremor after the artane was d/c.  However, this has become less bothersome for him over time.  He does notice that sometimes the medication wears off quickly and sometimes it does not.  It seems inconsistent.  03/06/13 update:  The patient is accompanied by his wife who supplements the history.  Last visit, we tried to add entacapone to each of the 4 carbidopa/levodopa dosing times.  He called me because of nausea and we decreased it to twice a day dosing.  He continued to have nausea and the medication was discontinued.  The patient states that the medication makes his arms feel "heavy." He has stopped the carbidopa/levodopa 50/200 at night as he does not think it was beneficial.  Sometimes, he will wake up in the middle of the night and take an extra of the carbidopa/levodopa CR 25 100.  He and his wife have noted some jerking spells at night.  He sleeps very restless. I did review notes in his chart since last visit.  He went to the emergency room on September 21 with a sensation of palpitations and tachycardia.  His  pacemaker was interrogated and there were no problems and he was subsequently sent home.  06/11/13 update:  This patient is accompanied in the office by his spouse who supplements the history.  Pt is on carbidopa/levodopa 25/100 CR - 2 in the AM and 6 other dosages throughout the day.  When he awakens in the middle of the night he may take a few extra dosages and he estimates he takes a total daily levodopa dose of 1000-1200 mg per day.  He has been very senstive to medication and has not been able to tolerate the immediate release carbidopa/levodopa, entacapone or klonopin (made arms/legs hurt).  Tremor feels like it is getting worse.  Vision is getting worse but he went to the eye doctor and his vision was stable.  He feels like the levodopa only lasts 1.5-2 hrs.  No falls.  Is exercising.    08/13/13 update:   This patient is accompanied in the office by his spouse who supplements the history.  Pt is on carbidopa/levodopa 25/100, and takes approximately 8-10 tablets per day.  Last visit, I also added amantadine to see if that would help the tremor. He does think that has helped.  He presents today to try and change to rytary to see if it would last longer.  He does not wish to  try duopa.  He denies hallucinations, lightheadedness, falls, syncope or dyskinesia.     10/01/13 update:  Last visit, I changed the patient to rytary.  It turned out to be very expensive as the patient had no RX coverage, which I did not realize.  When he changed back, the IR formulation was accidentally called in instead of the CR prepartation (and he didn't tolerate the IR in the past).  The patient also didn't notice that and he isn't even sure now what he is taking but knows he is taking 2 po qid of either the CR or IR.  He is having hallucinations.  He is having swelling in the feet and legs as well.   They saw cardiology last week and were started on lasix and they didn't think that it made a difference.  They are worried  about a discoloration in legs.  He has lost weight.  Had one fall on May 10.  Walking into bedroom, got lightheaded and hit his back on the dresser.  He didn't think that he hit his head at the time but now that they think that he did as he has had hallucinations since.  However, looking back at our notes, it was 5/7 when he called here to d/c the rytary and changed to the IR levodopa it was 5/10 when he fell so timing was likely just coincidental as he had likely just changed back to the IR preparation.  11/12/13 update:  The patient was seen back in followup in the neurology clinic, accompanied by his wife who helps to supplement the history.  Patient has a history of Parkinson's disease.  Last visit, I changed his levodopa back to the CR preparation to see if that would help his hallucinations.  He is currently on 2 tablets by mouth 4 times per day (8am/12/4pm/8pm).  Today, the patient states that hallucinations continue.  He states that he continues to see people.   Medication helps tremor when he first takes it and then the tremor comes back before the next dose.   Last visit, I told him to discontinue his amantadine because of complaints of swelling.  I did review records since our last visit.  Because he noticed no changes in swelling after discontinuation of the amantadine, he followed up with his primary care physician.  His primary care physician felt that the swelling was likely due to venous insufficiency and he recommended that the patient restart the amantadine and he has.  The patient does state that he doesn't think that he had any break between d/c the IR carbidopa/levodopa 25/100, starting the CR version and when he restarted the amantadine.   The patient did see his cardiologist and he saw no cardiac reason for the edema.  He did not want to increase the patient's Lasix further because of complaints of orthostasis and dizziness.  No significant etiology was determined for his weight loss  either.  01/07/14 update:  The patient was seen today in followup in the neurology clinic, accompanied by his wife who helps to supplement the history.  The patient has switched back on his own from the CR version of carbidopa/levodopa to the immediate release form.  He is on 2 po 5 times per day (8am and then every 3 hours).  He is complaining about more tremor and more balance issues.  No falls but near falls.  He is off of amantadine.  Hallucinations went away after it was d/c but tremor definitely picked up.  Leg swelling is better but not gone.  Some memory problems.  No driving since march, 2015.    05/06/14 update:  The patient is seen in f/u, accompanied by his wife who supplements the history.   He is on carbidopa/levodopa 25/100 CR, 2 po 5 times per day (8am and then every 3 hours). He continues to c/o increasing tremor.  States that he is having m. Cramping at nighttime.  His last dose is at 8pm and bedtime is at 10 pm and cramping will start waking him at 3:30 am. Asks about trying to take a combination of one CR and one IR and see how that works.   He states that his memory is "going downhill" although his wife is not quite as sure.  He doesn't drive and hasn't for a year but has not trouble with pills.    08/06/14 update:  The patient is following up today, accompanied by his wife who supplements the history.  Last visit, the patient wanted to try a combination of carbidopa levodopa 25/100 immediate release and extended release so that he was taking 1 pill of each 5 times per day; however he really never did that and is still taking a carbidopa/levodopa 25/100 IR, 2 po 5 times a day.  We did add a carbidopa/levodopa 50/200 at night to see if that would help with his cramping;  The cramping is better at night and he only seems to have it if he has swelling.  His wife mentions that memory is not quite as good as it used to be.  He has some difficulty remembering previous conversations and having word  finding difficulties.  It is not a major issue, but it is something she is noticing.  Much has happened in his other medical history since last visit.  I reviewed those records.  The patient had a nuclear stress test on 06/13/2014, demonstrating a high risk study with a large area of ischemia in the LAD artery distribution and a fixed defect in the inferior wall.  An echocardiogram demonstrated a left ventricular ejection fraction of approximately 20%, which was much decreased compared to his prior study of 50-55%.  A heart catheterization was subsequently scheduled and performed on 06/20/2014.  There was no evidence of ischemia to account for the dropped ejection fraction.  It was felt that he was likely not a candidate for an ICD and they talked about CRT and decided to hold on that.  They saw Dr. Rayann Heman yesterday.  12/03/14 update:  The patient presents today, accompanied by his wife who supplements the history.  The records that were made available to me were reviewed.  Last visit, we talked about our limited options, and ultimately decided to try a combination of carbidopa/levodopa 25/100 IR and carbidopa/levodopa 25/100 CR, one tablet each of these 5 times per day.  There was some misunderstanding and he ended up taking carbidopa/levodopa 25/100 IR in addition to carbidopa/levodopa 50/200 CR, 5 times per day.  He was then taking the carbidopa/levodopa 50/200 at night as well.  Overall, this helped the tremor but hallucinations picked up and therefore he went back to carbidopa/levodopa 25/100 IR, 2 po 5 times a day.  He is having more difficulty opening his hands in the morning.  Pts biggest c/o is vision change and he thinks that it is from his PD meds.  Been to [facility] in November and I reviewed those records and they said that they thought that it was primarily surface related  but didn't think that it was significant.  He tried eye drops for dry eye and nothing helped.  He asks me multiple times what else he  can do.  His wife also asks me about a referral for a consultation regarding focused ultrasound.  04/04/15 update:  The patient is following up today, accompanied by his wife who supplements the history.  He has a history of Parkinson's disease.  He is on carbidopa/levodopa 25/100, 2 tablets 5 times per day and he went back on carbidopa/levodopa 50/200 qhs.  Last visit, he would not levodopa was affecting his vision, although I did not.  Regardless, we decided to stop the medication for a few days.  He was not able to tolerate being off the medication because tremor was so bad and ended up restarting it fairly quickly after stopping it.  Tremor continues to be bothersome and asks if there is anything else he can do.  Depression is getting worse because of that.  No suicidal or homicidal ideation.  He is doing yoga with his wife and enjoys that.  He has had no falls but has had some near falls.  I did refer him to Dr. Sanda Klein regarding his vision change and he is going in Jan (pt moved appt back).  His wife had also requested a referral last visit to [facility] to possible focused ultrasound, but ultimately they decided that they did not want to go to the appointment.  I think that is probably a good decision as focused u/s is not indicated yet for Parkinsons disease.  They do bring in a brochure today about duopa and ask me about that today.  Also saw an article in paper about study Dr. Jimmey Ralph is doing at [facility] and he called the number.  He showed me the article today and it was for patients who have had little weak body dementia and Parkinson's dementia with hallucinations.  He has begun to have a few more hallucinations since last visit, but reports that he knows that they are not real.  07/10/15 update:  The patient is following up today, accompanied by his wife who supplements the history.  He has a history of Parkinson's disease.  He is on carbidopa/levodopa 25/100, 2 tablets 5 times per day (8/11/2/5/8) in  addition to carbidopa/levodopa 50/200 at night (10pm). He thinks that the medication wears off before next visit and asks about taking the 50/200 in addition to one 25/100.  I reminded him that he accidentally tried that last august but he had more hallucinations.  He states that he just tried that recently with the 25/100 just like he did last august and didn't have the hallucinations.  He did try Neupro since our last visit and worked up to 4 mg.  He really did not find this beneficial.  He did not have side effects with that.  It did not make hallucinations worse, and in fact he thought they were potentially even better.  He did not want to go up to 6 mg and ended up discontinuing the medication.  He has not had any falls since last visit but does feel that he has been more unstable.  Sometimes will yell out at night but seems infrequent.  No hallucinations or near syncope.  I did review records since last visit.  He was seen back at [facility] and ended up having eye surgery for lower eyelid retraction bilaterally on March 1.    11/24/15 update:  The patient is following  up today, accompanied by his wife who supplements the history.  After our last visit, the patient was supposed to be on carbidopa/levodopa 50/200 CR 5 times per day and no more than carbidopa/levodopa 25/100 3 times per day.  He called me quite some time after last visit to state that he had gone up on the carbidopa/levodopa 50/200, to 6 times per day and carbidopa/levodopa 25/100, 5 times per day and subsequently hallucinations had become very prominent.  I explained that unfortunately, this is why I did not want him to go up on the medication, as he was not a Nuplazid candidate because of the fact that he already had QT prolongation.  He went back to taking them as he was supposed to but he is still having some hallucinations.  He recognizes that they aren't real.  No falls since our last visit.  He is exercising some but gets tired quickly.   They asked me about xadago.   On July 13, he did undergo surgical placement of a new pacemaker.  He didn't notice a big difference in stamina with the new PPM but he has been able to breathe better.    12/26/15 update:  Pt returns for f/u, accompanied by his wife who supplements the history.  He is still on carbidopa/levodopa 25/100 3 times a day and carbidopa/levodopa 50/200 CR five times a day.  Pt tells me he is actually taking carbidopa/levodopa 50/200, 2 po 5 times a day but wife doesn't think he is doing it that way.  She isn't sure but has realized she needs to manage meds.  He will sometimes get up in the middle of the night and take an extra carbidopa/levodopa 25/100 and an extra 50/200.  Last visit the patient really wanted to try xadago because he had a friend on it who was doing great.  I was not convinced it would be that helpful and initially he decided to not try it because of this but ultimately did try it.  Pts wife noted confusion in the patient and she initially thought that it was the medication (although she had noted confusion prior to that and it was documented in a phone encounter).  The medication was d/c.  He has begun to have paranoia and has heard noises in the house in addition to visual hallucinations.  Has called his wife a liar when she tells him that no one is there.  Wife states that confusion continues.  He has asked his wife "how long he will live in this house" when they have owned that house for years.  Wife states that hallucinations have become more "complex."  Had UA that was neg (except dehydration).  Urine cx was negative.  Wife also notes more unsteady on feet  03/25/16 update:  Patient returns for follow-up, accompanied by his wife who supplements the history. He is still on carbidopa/levodopa 50/200 CR 5 times a day and carbidopa/levodopa 25/100 IR 3 times a day.  I had planned to see him about a week and a half after her last visit, but unfortunately he fell and  fractured his femur on the left and had surgery on 01/06/2016.  He subsequently went to [facility] for rehabilitation.  He did have post op delirium.  Wife states that "confusion looms."  Didn't quite get back to baseline.  He is back at home.  He is having more hallucinations.  They are now not just visual but auditory as well.  Confuses wifes position in  the home - will say "are you the one who cooks for me?"  Will then say, "who is the person who sleeps in my bed."  07/28/16 update:  Patient returns today for follow-up, accompanied by his wife who supplements the history.  Patient is on carbidopa/levodopa 50/200 5 times per day and carbidopa/levodopa 25/100 3 times per day.  Wife notes that after 2.5 hours, tremors increase and she worries about changing med.  With the assistance and permission of cardiology, Nuplazid was started last visit.  His QT did lengthen somewhat after that, but his cardiologist felt that it was okay to continue it and follow the EKG.  His last EKG was just done on 07/23/2016.  His QTC prior to Nuplazid was 438.  His QTC on 04/09/2016 was 487.  His QTC on 07/23/2016 was 488.  Because it had been stable, cardiology felt that it was okay to continue the medication.  Wife states that hallucinations are worse as are delusions. Most hallucinations are in the form of humans.  Most are not frightening.  He will ask wife for reassurance that they aren't there and recognizes they aren't there but wife states that he won't recognize that delusions aren't normal.  He is convinced that wife leaves house in the middle of the night and goes out in the middle of the night and goes out with others.  He also thinks that someone else has access to the bank account.   She would like to get off of it.  Few minor falls since last visit.  One missed a chair.  One fell between 2 chairs.  Few near falls.    11/04/16 update:  Patient seen today, accompanied by his wife who supplements the history.  He is on  carbidopa/levodopa 50/200 5 times a day and carbidopa/levodopa 25/100 3 times per day.  Wife notes wearing off phenomenon with tremor.  They stopped the quetiapine since last visit.  We did start Depakote as a mood stabilizer, hoping that that would not worsen tremor.  Today, they state that they are not sure if it helped but it may have helped slightly.  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  This has gotten in the way of feeling he can go out socially.  Wife does state that he is regularly tearful but is usually in response to something.  His visual spatial perception is also off.  Having foot and heel pain and seeing podiatry.  Having some cramping but better than used to be.  In regards to his sleep, they state that this seems to be hit and miss.  He has had no falls.  He is still having visual hallucinations that are becoming a problem as he gets angry when wife doesn't see them.  He also gets angry when caregiver comes in 2 days a week.  03/07/17 update: Patient is seen today for his Parkinson's disease.  He is accompanied by his wife who supplements the history.  The records that were made available to me were reviewed.  He remains on carbidopa/levodopa 50/200, 1 tablet 5 times per day and carbidopa/levodopa 25/100, 1 tablet 3 times per day.  Last visit, they wanted to start Nuedexta to see if that would help pseudobulbar affect.  It definitely helped the crying spells, but his wife was  frustrated that it was not helping him sleep (which I did not realize was a goal of theirs with this medication).  He ended up stopping this and retrying Seroquel.  We called home at the end of August to see how he was doing on the Seroquel and wife stated that sleep and mood were much better.  I did get a note from his primary care  physician at the end of October that the patient's behavior and psychotic symptoms last hallucinations were becoming more difficult and they had moved to [place].  Wife states that they actually haven't moved yet and they will move on 12/13.  Patient's primary care physician had increased his Seroquel to 25 mg twice per day.  Wife states that he is pretty good most of the time.  He didn't do well last night but wife thinks that he was anxious about coming here.  Still having daytime hallucinations but he has to verify that they aren't there with his wife.  It is not making him sleepy during the daytime.  He is very confused at times.  Not going to fitness center as much.    Neuroimaging has previously been performed.  It is not available for my review today.  PREVIOUS MEDICATIONS: Sinemet (reported bad dreams/dizziness on regular formulation and same with Mirapex); requip;  on Sinemet CR currently, entacapone (felt dizzy and arms felt "heavy"); klonopin (made arms/legs hurt); artane; amantadine (hallucinations); rytary (costly and pt has no RX coverage); neupro; nuplazid; Seroquel (helped sleep but felt that it caused him to be somewhat achy)  ALLERGIES:   Allergies  Allergen Reactions  . Plavix [Clopidogrel]     Fatigue   . Pravastatin     Muscle aches  . Zocor [Simvastatin]     REACTION: dizziness  . Penicillins Rash    Has patient had a PCN reaction causing immediate rash, facial/tongue/throat swelling, SOB or lightheadedness with hypotension: YES Has patient had a PCN reaction causing severe rash involving mucus membranes or skin necrosis: NO Has patient had a PCN reaction that required hospitalization NO Has patient had a PCN reaction occurring within the last 10 years: NO If all of the above answers are "NO", then may proceed with Cephalosporin use.    CURRENT MEDICATIONS:  Current Outpatient Medications on File Prior to Visit  Medication Sig Dispense Refill  . acetaminophen  (TYLENOL) 500 MG tablet Take 500 mg by mouth every 6 (six) hours as needed for mild pain.    Marland Kitchen aspirin 81 MG tablet Take 81 mg by mouth daily.    . bisoprolol-hydrochlorothiazide (ZIAC) 5-6.25 MG tablet Take 1 tablet by mouth daily. 90 tablet 2  . carbidopa-levodopa (SINEMET CR) 50-200 MG tablet TAKE 1 TABLET 5 TIMES DAILY (8am/11am/2pm/6pm/10pm) 450 tablet 1  . carbidopa-levodopa (SINEMET IR) 25-100 MG tablet TAKE 1 TABLET 3 TIMES A DAY (8am/2pm/10pm) 270 tablet 1  . furosemide (LASIX) 80 MG tablet Take 80 mg by mouth daily.    . pantoprazole (PROTONIX) 40 MG tablet Take 1 tablet (40 mg total) daily by mouth. 30 tablet 0  . Polyethyl Glycol-Propyl Glycol (SYSTANE ULTRA OP) Place 1 drop into both eyes daily.    . polyethylene glycol (MIRALAX / GLYCOLAX) packet Take 17 g by mouth daily.    . potassium chloride (K-DUR) 10 MEQ tablet Take 20 mEq daily by mouth.     . QUEtiapine (SEROQUEL) 25 MG tablet Take 1 tablet (25 mg total) by mouth 2 (two) times daily. 1 tablet 0  .  vitamin B-12 (CYANOCOBALAMIN) 1000 MCG tablet Take 1,000 mcg by mouth daily.     No current facility-administered medications on file prior to visit.     PAST MEDICAL HISTORY:   Past Medical History:  Diagnosis Date  . Allergy   . Aortic valve stenosis, acquired    a. s/p Porcine AVR 2001;  b. 09/2014 [place] Echo: mod-sev bioprosthetic AS w/ minimal change in CO and only mild increase in gradients w/ dobutamine.   Marland Kitchen CAD (coronary artery disease)    a. s/p CABG with LIMA-LAD in 2001 with AVR. b. BMS to LCx 8/08. c. Abnl nuc/dec EF 05/2014 - s/p cath with patent LIMA to LAD, patent dominant right, mild LCx disease, patent stent. Med rx.  . Chronic combined systolic (congestive) and diastolic (congestive) heart failure (HCC)    a. EF 50-55% in 02/2013;  b. 05/2014 Echo: EF 20-25%, Gr1 DD.  Marland Kitchen Diverticulitis   . ED (erectile dysfunction)   . Essential hypertension   . GERD (gastroesophageal reflux disease)   . Hyperlipidemia   .  MALT lymphoma ([place])   . Nonischemic cardiomyopathy ([place])    a. 05/2014 Echo: EF 20-25%-->f/u cath w/ nonobs dzs.  . Osteoarthritis   . Osteoporosis   . Parkinson's disease   . Renal artery stenosis (HCC)    a. 90% RRA stenosis previously. b. Duplex 09/2014: R renal artery 60-99%, L renal artery 1-59%.  . S/P cardiac pacemaker procedure, Medtronic Adapta L  ADDRL1, 01/21/12    a. 01/2012 s/p MDT [place] PPM (ser # [id number]).  . Symptomatic bradycardia    a. 01/2012 s/p MDT [place] PPM (ser # [id number]).  Marland Kitchen TIA (transient ischemic attack) 06/08   a. 09/2006    PAST SURGICAL HISTORY:   Past Surgical History:  Procedure Laterality Date  . AORTIC VALVE REPLACEMENT  2001   Porcine valve  . BiV Pacemaker Upgrade N/A 10/30/2015   Performed by Thompson Grayer, MD at [facility]  . CATARACT EXTRACTION  06/09   left  . CORONARY ARTERY BYPASS GRAFT  2001   ( van trigt ) aortic valve replacement 2001; LIMA-LAD.  Marland Kitchen CORONARY STENT PLACEMENT  11/2007   8/09  Left Circumflex Stent by Dr Toy Care started and aggrenox stopped  . CORONARY/GRAFT ANGIOGRAPHY  06/20/2014   Performed by Lorretta Harp, MD at [facility] CATH LAB  . ESOPHAGOGASTRODUODENOSCOPY     multiple, Colon/ EGD benign 11/2004  . EYE SURGERY  06/18/15  . EYE SURGERY  04/2016  . INGUINAL HERNIA REPAIR     LIH 1997Llap. bilateral hernias 1998  . LAPAROSCOPIC CHOLECYSTECTOMY  07/11   Dr.Rosenbower  . NM MYOCAR PERF WALL MOTION  11/22/2007   mild septal ischemia,EF 66%  . PACEMAKER INSERTION  01/21/2012   Medtronic Adapta L implanted for complete heart block  . PERMANENT PACEMAKER INSERTION N/A 01/21/2012   Performed by Thompson Grayer, MD at [facility] CATH LAB  . PILONIDAL CYST / SINUS EXCISION  1953  . SQUAMOUS CELL CARCINOMA EXCISION  3/13   back  . TOTAL HIP ARTHROPLASTY ANTERIOR APPROACH Left 01/06/2016   Performed by Hessie Knows, MD at [facility] ORS    SOCIAL HISTORY:   Social History   Socioeconomic History  .  Marital status: Married    Spouse name: Not on file  . Number of children: 3  . Years of education: Not on file  . Highest education level: Not on file  Social Needs  . Emergency planning/management officer  strain: Not on file  . Food insecurity - worry: Not on file  . Food insecurity - inability: Not on file  . Transportation needs - medical: Not on file  . Transportation needs - non-medical: Not on file  Occupational History  . Occupation: retired- [employer]  . Occupation:    Tobacco Use  . Smoking status: Former Smoker    Packs/day: 1.00    Years: 5.00    Pack years: 5.00    Types: Cigarettes    Last attempt to quit: 06/06/1963    Years since quitting: 53.7  . Smokeless tobacco: Never Used  Substance and Sexual Activity  . Alcohol use: No    Comment: very rare  . Drug use: No  . Sexual activity: No  Other Topics Concern  . Not on file  Social History Narrative   Has living will   Wife, then son [name], hold health care POA   Requests DNR --done   Requests no tube feeds if cognitively unaware    FAMILY HISTORY:   Family Status  Relation Name Status  . Mother  Deceased at age 17       old age  . Father  Deceased at age 60       acute bronchitis, cotton exposure  . Sister  Deceased       trauma  . Brother  Deceased       2, deceased  . Daughter  Deceased at age 63       osteosarcoma  . Child  Alive       3, healthy  . Brother  Alive       healthy  . Sister  Alive       healthy    ROS:    A complete 10 system review of systems was obtained and was unremarkable apart from what is mentioned above.  PHYSICAL EXAMINATION:    VITALS:   Vitals:   03/07/17 0840  BP: 140/70  Pulse: 65  SpO2: 94%  Weight: 151 lb (68.5 kg)  Height: 5\' 6"  (1.676 m)   Wt Readings from Last 3 Encounters:  03/07/17 151 lb (68.5 kg)  03/04/17 147 lb (66.7 kg)  12/15/16 138 lb (62.6 kg)     GEN:  The patient appears stated age and is in NAD.   HEENT:  Normocephalic, atraumatic.   The mucous membranes are moist. The superficial temporal arteries are without ropiness or tenderness. CV:  RRR Lungs:  CTAB Neck/HEME:  There are no carotid bruits bilaterally.  Neurological examination:  Orientation: The patient is alert and oriented x2. Montreal Cognitive Assessment  12/26/2015 12/03/2014  Visuospatial/ Executive (0/5) 0 2  Naming (0/3) 3 3  Attention: Read list of digits (0/2) 2 1  Attention: Read list of letters (0/1) 1 0  Attention: Serial 7 subtraction starting at 100 (0/3) 1 2  Language: Repeat phrase (0/2) 2 2  Language : Fluency (0/1) 0 0  Abstraction (0/2) 2 1  Delayed Recall (0/5) 1 2  Orientation (0/6) 4 5  Total 16 18  Adjusted Score (based on education) 17 18    Movement examination: Tone: There is normal tone in the bilateral upper extremities today.  The tone in the lower extremities is normal.  Abnormal movements: There is a mild to mod resting tremor bilaterally, R>L.  No dyskinesia. Coordination:  There is no decremation, with any form of RAMS, including alternating supination and pronation of the forearm, hand opening and closing,  finger taps, heel taps and toe taps. Gait and Station: The patient arises out of the chair without the use of the hands.  He walks well and purposefully has good arm swing.    Lab Results  Component Value Date   WBC 8.0 09/14/2016   HGB 13.3 09/14/2016   HCT 38.9 09/14/2016   MCV 92 09/14/2016   PLT 371 09/14/2016     Chemistry      Component Value Date/Time   NA 140 03/04/2017 1144   K 4.5 03/04/2017 1144   CL 95 (L) 03/04/2017 1144   CO2 31 (H) 03/04/2017 1144   BUN 17 03/04/2017 1144   CREATININE 0.98 03/04/2017 1144   CREATININE 0.75 03/15/2016 1342      Component Value Date/Time   CALCIUM 9.4 03/04/2017 1144   ALKPHOS 64 12/18/2015 1045   AST 13 12/18/2015 1045   ALT <3 (L) 12/18/2015 1045   BILITOT 0.5 12/18/2015 1045     Lab Results  Component Value Date   VITAMINB12 660 03/06/2013   Lab  Results  Component Value Date   TSH 4.349 06/17/2014     ASSESSMENT/PLAN:  1.  Idiopathic Parkinsons disease.    -Long discussion again with the patient and his wife.  Unfortunately, he really is in a position where we cannot go up further on the levodopa because of hallucinations and he is not a candidate for Nuplazid/seroquel because of prolonged QT.  He is not a DBS candidate because of multiple other medical problems.  He will take carbidopa/levodopa 50/200 CR, 5 times per day and carbidopa/levodopa 25/100 IR, up to 3 times per day.   May need to decrease this but wife doesn't want to right now as tremors increasing.  Would perhaps help with hallucinations  -talked about adding activities once moved to [place] 2.  Parkinson disease dementia with hallucinations  -increase seroquel 25mg , 1.5 tablets in the AM, 1 tablet in the PM 3.  B12 deficiency.  -He is on oral supplements  4.  Depression and PBA  -On Seroquel. 5.  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  -He could not tolerate klonopin.  Is overall mild and talked about home safety 6. Follow up is anticipated in the next few months, sooner should new neurologic issues arise.  Much greater than 50% of this visit was spent in counseling and coordinating care.  Total face to face time:  25 min

## 2017-03-04 NOTE — Progress Notes (Signed)
EPIC Encounter for ICM Monitoring  Patient Name: Ian [name] is a 83 y.o. male Date: 03/04/2017 Primary Care Physican: Venia Carbon, MD Primary Cardiologist: Gwenlyn Found Electrophysiologist: Croitoru Dry Weight:140lbs(was 136 lbs about 1 month ago) Bi-V Pacing: >99%       Heart Failure questions reviewed, pt remains tired, listless and chronic shortness of breath.   No ankle swelling today.  Weight down by 2 lbs since 11/12.   Thoracic impedance has returned to normal after taking Furosemide 80 AM and 40 PM daily x 4 days until transmission today.  Prescribed dosage: Furosemide dosage will be evaluated today at the office.  Recommendations:  Patient being seen in office today.  Follow-up plan: ICM clinic phone appointment on 03/17/2017 to recheck fluid levels.    Copy of ICM check sent to Dr. Gwenlyn Found, Dr. Sallyanne Kuster and Rosaria Ferries, PA.   3 month ICM trend: 03/04/2017    1 Year ICM trend:       Rosalene Billings, RN 03/04/2017 9:33 AM

## 2017-03-04 NOTE — Progress Notes (Signed)
Cardiology Office Note   Date:  03/04/2017   ID:  Ian [name], DOB 09-23-1928, MRN [id number]  PCP:  Venia Carbon, MD  Cardiologist: Dr. Gwenlyn Found 09/14/2016, Dr. Sallyanne Kuster, Dr. Madolyn Frieze, PA-C   Chief Complaint  Patient presents with  . Follow-up  . Dizziness    am  . Shortness of Breath    Everyday.  . Chest Pain    Pressue    . Edema    Some.    History of Present Illness: Ian [name] is a 83 y.o. male with a history of CABG 2001 w/ LIMA-LAD & porcine Aortic valve, BMS CFX 2009, 90% R-RAS, HTN, HLD, Parkinson's dz, s/p [place] PPM for bradycardia, cath 2016 w/ patent LIMA-LAD and otherwise non-critical dz, Dr Rayann Heman upgraded to [place] PPM 09/2015, MALT Lymphoma  Ian [name] presents for cardiology follow up.  He feels dizzy during the day most days. His SBP at home has not been low, >130. The Sinemet dose has not changed in a long time. He was recently restarted on the Seroquel, to help sleep, hallucinations. They are not sure if the dizziness changed after he was started on the med and it was increased to bid. Pt feels the dizziness started after an accident about a year ago. He feels the dizziness gets better when he moves around more. He feels it more in the am, before meds or food.   He has chest pain at times. It is a bloated feeling or pressure. He gets it most nights when he lies down and when he lays on the couch to take a nap. He cannot remember this happening with exertion, but feels it has happened. 8/10 at its worst. Prevacid helps relieve the pressure.   He has SOB with exertion. He walks through the house multiple times, did this yesterday. Goes to the [facility] at times, but not recently. He sleeps on 3 pillows, has been for a long time.  He denies orthopnea at this time but believes he was having some earlier.  He was having lower extremity edema but that resolved when he increased the Lasix.  His Optivol showed a fluid increase on 11/12.  He increased Lasix by adding 40 mg pm dose for 4 days.   Home weight has been running 140-142.   They are moving to [place] in 2 weeks or so.    Past Medical History:  Diagnosis Date  . Allergy   . Aortic valve stenosis, acquired    a. s/p Porcine AVR 2001;  b. 09/2014 [place] Echo: mod-sev bioprosthetic AS w/ minimal change in CO and only mild increase in gradients w/ dobutamine.   Marland Kitchen CAD (coronary artery disease)    a. s/p CABG with LIMA-LAD in 2001 with AVR. b. BMS to LCx 8/08. c. Abnl nuc/dec EF 05/2014 - s/p cath with patent LIMA to LAD, patent dominant right, mild LCx disease, patent stent. Med rx.  . Chronic combined systolic (congestive) and diastolic (congestive) heart failure (HCC)    a. EF 50-55% in 02/2013;  b. 05/2014 Echo: EF 20-25%, Gr1 DD.  Marland Kitchen Diverticulitis   . ED (erectile dysfunction)   . Essential hypertension   . GERD (gastroesophageal reflux disease)   . Hyperlipidemia   . MALT lymphoma ([place])   . Nonischemic cardiomyopathy ([place])    a. 05/2014 Echo: EF 20-25%-->f/u cath w/ nonobs dzs.  . Osteoarthritis   . Osteoporosis   . Parkinson's disease   . Renal artery  stenosis (HCC)    a. 90% RRA stenosis previously. b. Duplex 09/2014: R renal artery 60-99%, L renal artery 1-59%.  . S/P cardiac pacemaker procedure, Medtronic Adapta L  ADDRL1, 01/21/12    a. 01/2012 s/p MDT [place] PPM (ser # [id number]).  . Symptomatic bradycardia    a. 01/2012 s/p MDT [place] PPM (ser # [id number]).  Marland Kitchen TIA (transient ischemic attack) 06/08   a. 09/2006    Past Surgical History:  Procedure Laterality Date  . AORTIC VALVE REPLACEMENT  2001   Porcine valve  . BiV Pacemaker Upgrade N/A 10/30/2015   Performed by Thompson Grayer, MD at [facility]  . CATARACT EXTRACTION  06/09   left  . CORONARY ARTERY BYPASS GRAFT  2001   ( van trigt ) aortic valve replacement 2001; LIMA-LAD.  Marland Kitchen CORONARY STENT PLACEMENT  11/2007   8/09  Left Circumflex Stent by Dr Toy Care  started and aggrenox stopped  . CORONARY/GRAFT ANGIOGRAPHY  06/20/2014   Performed by Lorretta Harp, MD at [facility] CATH LAB  . ESOPHAGOGASTRODUODENOSCOPY     multiple, Colon/ EGD benign 11/2004  . EYE SURGERY  06/18/15  . EYE SURGERY  04/2016  . INGUINAL HERNIA REPAIR     LIH 1997Llap. bilateral hernias 1998  . LAPAROSCOPIC CHOLECYSTECTOMY  07/11   Dr.Rosenbower  . NM MYOCAR PERF WALL MOTION  11/22/2007   mild septal ischemia,EF 66%  . PACEMAKER INSERTION  01/21/2012   Medtronic Adapta L implanted for complete heart block  . PERMANENT PACEMAKER INSERTION N/A 01/21/2012   Performed by Thompson Grayer, MD at [facility] CATH LAB  . PILONIDAL CYST / SINUS EXCISION  1953  . SQUAMOUS CELL CARCINOMA EXCISION  3/13   back  . TOTAL HIP ARTHROPLASTY ANTERIOR APPROACH Left 01/06/2016   Performed by Hessie Knows, MD at [facility] ORS    Current Outpatient Medications  Medication Sig Dispense Refill  . acetaminophen (TYLENOL) 500 MG tablet Take 500 mg by mouth every 6 (six) hours as needed for mild pain.    Marland Kitchen aspirin 81 MG tablet Take 81 mg by mouth daily.    . bisoprolol-hydrochlorothiazide (ZIAC) 5-6.25 MG tablet Take 1 tablet by mouth daily. 90 tablet 2  . carbidopa-levodopa (SINEMET CR) 50-200 MG tablet TAKE 1 TABLET 5 TIMES DAILY (8am/11am/2pm/6pm/10pm) 450 tablet 1  . carbidopa-levodopa (SINEMET IR) 25-100 MG tablet TAKE 1 TABLET 3 TIMES A DAY (8am/2pm/10pm) 270 tablet 1  . furosemide (LASIX) 80 MG tablet Take 80 mg by mouth daily.    Vladimir Faster Glycol-Propyl Glycol (SYSTANE ULTRA OP) Place 1 drop into both eyes daily.    . polyethylene glycol (MIRALAX / GLYCOLAX) packet Take 17 g by mouth daily.    . potassium chloride (K-DUR) 10 MEQ tablet Take 20 mEq daily by mouth.     . QUEtiapine (SEROQUEL) 25 MG tablet Take 1 tablet (25 mg total) by mouth 2 (two) times daily. 1 tablet 0  . vitamin B-12 (CYANOCOBALAMIN) 1000 MCG tablet Take 1,000 mcg by mouth daily.    . pantoprazole (PROTONIX) 40 MG tablet Take 1 tablet  (40 mg total) daily by mouth. 30 tablet 0   No current facility-administered medications for this visit.     Allergies:   Plavix [clopidogrel]; Pravastatin; Zocor [simvastatin]; and Penicillins    Social History:  The patient  reports that he quit smoking about 53 years ago. His smoking use included cigarettes. He has a 5.00 pack-year smoking history. he has never used  smokeless tobacco. He reports that he does not drink alcohol or use drugs.   Family History:  The patient's family history includes Asthma in his father; Heart disease in his brother.    ROS:  Please see the history of present illness. All other systems are reviewed and negative.    PHYSICAL EXAM: VS:  BP 112/64   Pulse 62   Ht 5' 6.5" (1.689 m)   Wt 147 lb (66.7 kg)   BMI 23.37 kg/m  , BMI Body mass index is 23.37 kg/m. GEN: Well nourished, well developed, male in no acute distress  HEENT: normal for age  Neck: minimal JVD, no carotid bruit, no masses Cardiac: RRR; 2/6 murmur, no rubs, or gallops Respiratory: decreased BS bases bilaterally, normal work of breathing GI: soft, nontender, nondistended, + BS MS: no deformity or atrophy; no edema; distal pulses are 2+ in 2/4 extremities; decreased in both lower extremities Skin: warm and dry, no rash Neuro:  Strength and sensation are intact Psych: euthymic mood, full affect   EKG:  EKG is ordered today. The ekg ordered today demonstrates a-B dual paced rhythm heart rate 62, QRS duration 132 ms  ECHO: 06/11/2016 - Left ventricle: The cavity size was normal. Wall thickness was   increased in a pattern of moderate LVH. There was mild focal   basal hypertrophy of the septum. Systolic function was normal.   The estimated ejection fraction was in the range of 60% to 65%.   Wall motion was normal; there were no regional wall motion   abnormalities. Doppler parameters are consistent with abnormal   left ventricular relaxation (grade 1 diastolic dysfunction). -  Aortic valve: A bioprosthesis was present. There was mild   regurgitation. - Aortic root: The aortic root was moderately dilated. - Ascending aorta: The ascending aorta was moderately dilated. - Mitral valve: Calcified annulus. There was mild regurgitation. - Left atrium: The atrium was mildly dilated. - Right atrium: The atrium was mildly dilated. - Atrial septum: There was an atrial septal aneurysm. - Pulmonary arteries: Systolic pressure was mildly increased. PA   peak pressure: 32 mm Hg (S). Impressions: - Normal LV systolic function; moderate LVH; grade 1 diastolic   dysfunction; bioprosthetic aortic valve with mildly elevated mean   gradient of 21 mmHg; mild AI; moderately dilated aortic root (4.7   cm); suggest CTA or MRA to further assess; mild biatrial   enlargement; mild MR; mild TR; mildly elevated pulmonary   pressure.  CATH: 06/20/2014 ANGIOGRAPHIC RESULTS:  1. Left main; normal  2. LAD; 80% proximal to mid, occluded distally 3. Left circumflex; nondominant with a 40-50% smooth ostial/proximal stenosis. The stent in the AV groove was widely patent.  4. Right coronary artery; dominant with minimal irregularities 5.LIMA TO LAD; widely patent 7. Left ventriculography; not performed today IMPRESSION:Mr. Wix has a patent LIMA to the LAD with a patent dominant right and mild circumflex disease. His RA pressure and RV pressures were low. I did not perform a full right heart cath because he had atrial and ventricular leads in place. I do not think the etiology for his decline in LV function is ischemically mediated. Continue medical therapy will be recommended. The sheaths were removed and pressure held on the groin to achieve hemostasis. The patient left the lab in stable condition. He will remember coming for 6 hours, will be gently hydrated overnight and discharged home in the morning. He left the lab in stable condition.  MYOVIEW 06/13/2014 Impression Exercise Capacity:  Lexiscan with no exercise. BP Response:  Normal blood pressure response. Clinical Symptoms:  No significant symptoms noted. ECG Impression:  No significant ECG changes with Lexiscan. Comparison with Prior Nuclear Study: No previous nuclear study performed Overall Impression:  High risk stress nuclear study with a large area of ischemia in the distribution of the LAD artery and a fixed defect in the inferior wall (in the absence of gated images cannot distinguish scar from diaphragmatic attenuation). While RV pacing artifact may be contributing to the septal abnormality, it would not explain the reversible nature of the defect..   Recent Labs: 03/15/2016: Brain Natriuretic Peptide 209.8 09/14/2016: BUN 18; Creatinine, Ser 0.84; Hemoglobin 13.3; Platelets 371; Potassium 4.8; Sodium 142    Lipid Panel    Component Value Date/Time   CHOL 163 03/29/2012 1138   TRIG 75.0 03/29/2012 1138   HDL 54.80 03/29/2012 1138   CHOLHDL 3 03/29/2012 1138   VLDL 15.0 03/29/2012 1138   LDLCALC 93 03/29/2012 1138   LDLDIRECT 98.3 07/21/2006 1521     Wt Readings from Last 3 Encounters:  03/04/17 147 lb (66.7 kg)  12/15/16 138 lb (62.6 kg)  11/04/16 147 lb (66.7 kg)     Other studies Reviewed: Additional studies/ records that were reviewed today include: office notes, hospital records and testing.  ASSESSMENT AND PLAN:  1.  Acute on chronic diastolic CHF: His weight is 147 pounds on the scales here in the office today, but he has returned to his baseline of approximately 140 pounds according to his home scales.  He is encouraged to continue daily weights and low-sodium diet.  His Optivol was rechecked and was back to normal today.  He does not have significant volume overload by exam.  Continue current therapy and it is okay for them to take potassium tablet and Lasix as needed for weight gain.  Check a BMET today  2.  Chest discomfort: His symptoms have been relieved by Prevacid.  He does not  remember any exertional symptoms.  He is to keep a symptom diary and I will start Protonix 40 mg daily.  If this works, follow up with Dr. Silvio Pate for further management.  If it does not work, we may need to do a stress test or heart catheterization to evaluate him for ischemia.  I reassured them that his stent was open at the last heart cath in 2016.  I did advise that he had a distal LAD stenosis that has to be treated medically.  However, with his dizziness, I will not add Imdur.  3.  Dizziness: His symptoms are mostly in the morning.  Occur before he has had any of his medications.  It resolves in a brief amount of time, when he starts moving around more.  His wife is encouraged to check his blood pressure when he feels this way.  He does not feel he is in danger of losing consciousness or falling.  Continue to monitor symptoms.   Current medicines are reviewed at length with the patient today.  The patient does not have concerns regarding medicines.  The following changes have been made: Add Protonix  Labs/ tests ordered today include:   Orders Placed This Encounter  Procedures  . Basic metabolic panel  . EKG 12-Lead     Disposition:   FU with Dr. Gwenlyn Found  Signed, Rosaria Ferries, PA-C  03/04/2017 1:05 PM    [facility] Phone: [phone]; Fax: [phone]  This note was written with  the assistance of speech recognition software. Please excuse any transcriptional errors.

## 2017-03-04 NOTE — Progress Notes (Signed)
That was a quick fix MCr

## 2017-03-07 ENCOUNTER — Encounter: Payer: Self-pay | Admitting: Neurology

## 2017-03-07 ENCOUNTER — Encounter: Payer: Self-pay | Admitting: Psychology

## 2017-03-07 ENCOUNTER — Ambulatory Visit (INDEPENDENT_AMBULATORY_CARE_PROVIDER_SITE_OTHER): Payer: Medicare Other | Admitting: Neurology

## 2017-03-07 VITALS — BP 140/70 | HR 65 | Ht 66.0 in | Wt 151.0 lb

## 2017-03-07 DIAGNOSIS — I701 Atherosclerosis of renal artery: Secondary | ICD-10-CM | POA: Diagnosis not present

## 2017-03-07 DIAGNOSIS — F0280 Dementia in other diseases classified elsewhere without behavioral disturbance: Secondary | ICD-10-CM

## 2017-03-07 DIAGNOSIS — G20 Parkinson's disease: Secondary | ICD-10-CM | POA: Diagnosis not present

## 2017-03-07 DIAGNOSIS — R441 Visual hallucinations: Secondary | ICD-10-CM

## 2017-03-07 MED ORDER — QUETIAPINE FUMARATE 25 MG PO TABS: ORAL_TABLET | ORAL | 1 refills | Status: AC

## 2017-03-07 NOTE — Progress Notes (Signed)
Pt was seen by Suanne Marker on 11/16

## 2017-03-07 NOTE — Patient Instructions (Signed)
Increase seroquel - 25 mg - 1.5 tablets in the AM and continue the 1 tablet at night

## 2017-03-07 NOTE — Progress Notes (Signed)
Patient and his wife are moving to [place]. I shared information on plans to expand some programs closer to the [facility] area.

## 2017-03-17 ENCOUNTER — Telehealth: Payer: Self-pay | Admitting: Cardiology

## 2017-03-17 ENCOUNTER — Ambulatory Visit (INDEPENDENT_AMBULATORY_CARE_PROVIDER_SITE_OTHER): Payer: Self-pay

## 2017-03-17 DIAGNOSIS — Z950 Presence of cardiac pacemaker: Secondary | ICD-10-CM

## 2017-03-17 DIAGNOSIS — I5022 Chronic systolic (congestive) heart failure: Secondary | ICD-10-CM

## 2017-03-17 NOTE — Progress Notes (Signed)
EPIC Encounter for ICM Monitoring  Patient Name: Ian [name] is a 83 y.o. male Date: 03/17/2017 Primary Care Physican: Venia Carbon, MD Primary Cardiologist: Gwenlyn Found Electrophysiologist: Croitoru Dry Weight:139lbs Bi-V Pacing: >99%       Spoke wife. Heart Failure questions reviewed, pt asymptomatic.   Thoracic impedance normal.  Prescribed dosage: Furosemide 80 mg 1 tablet daily.  Potassium 10 mEq 1tabletby mouth daily.  Rhonda Barrett's OV note says OK to take extra 1/2 tablet of lasix and potassium daily for weight gain of 3lbs in 1 day or 5lbs in 1 week  Labs: 09/14/2016 Creatinine 0.84, BUN 18, Potassium 4.8, Sodium 142, EGFR 80-92 11/27/2017Creatinine 0.75, BUN 12, Potassium 4.3, Sodium 136 01/09/2016 Creatinine 0.67, BUN 15, Potassium 4.0, Sodium 135, EGFR >60  01/07/2016 Creatinine 0.79, BUN 21, Potassium 4.2, Sodium 139 01/06/2016 Creatinine 0.82, BUN 19, Potassium 4.0, Sodium 139 12/18/2015 Creatinine 0.94, BUN 14, Potassium 4.5, Sodium 142 10/23/2015 Creatinine 0.89, BUN 17, Potassium 4.3, Sodium 139 09/23/2015 Creatinine 0.73, BUN 14, Potassium 5.2, Sodium 138  Recommendations: No changes.   Encouraged to call for fluid symptoms.  Follow-up plan: ICM clinic phone appointment on 03/29/2017.    Copy of ICM check sent to Dr. Sallyanne Kuster.   3 month ICM trend: 03/17/2017    1 Year ICM trend:       Rosalene Billings, RN 03/17/2017 4:10 PM

## 2017-03-17 NOTE — Telephone Encounter (Signed)
LMOVM reminding pt to send remote transmission.   

## 2017-03-29 ENCOUNTER — Ambulatory Visit (INDEPENDENT_AMBULATORY_CARE_PROVIDER_SITE_OTHER): Payer: Medicare Other

## 2017-03-29 DIAGNOSIS — Z950 Presence of cardiac pacemaker: Secondary | ICD-10-CM

## 2017-03-29 DIAGNOSIS — I5022 Chronic systolic (congestive) heart failure: Secondary | ICD-10-CM

## 2017-03-31 ENCOUNTER — Telehealth: Payer: Self-pay

## 2017-03-31 NOTE — Progress Notes (Signed)
EPIC Encounter for ICM Monitoring  Patient Name: Ian [name] is a 83 y.o. male Date: 03/31/2017 Primary Care Physican: Venia Carbon, MD Primary Cardiologist: Gwenlyn Found Electrophysiologist: Croitoru Dry Weight:Previous weight 139lbs Bi-V Pacing: >99%       Attempted call to wife and unable to reach.    Transmission reviewed.   Thoracic impedance abnormal suggesting fluid accumulation since 03/26/2017 and trending back toward baseline.  Impedance also decreased 03/17/2017 to 03/23/2017.  Prescribed dosage: Furosemide 80 mg 1 tablet daily.Potassium 10 mEq 1tabletby mouth daily.  Rhonda Barrett's OV note says OK to take extra 1/2 tablet of lasix and potassium daily for weight gain of 3lbs in 1 day or 5lbs in 1 week  Labs: 09/14/2016 Creatinine 0.84, BUN 18, Potassium 4.8, Sodium 142, EGFR 80-92 11/27/2017Creatinine 0.75, BUN 12, Potassium 4.3, Sodium 136 01/09/2016 Creatinine 0.67, BUN 15, Potassium 4.0, Sodium 135, EGFR >60  01/07/2016 Creatinine 0.79, BUN 21, Potassium 4.2, Sodium 139 01/06/2016 Creatinine 0.82, BUN 19, Potassium 4.0, Sodium 139 12/18/2015 Creatinine 0.94, BUN 14, Potassium 4.5, Sodium 142 10/23/2015 Creatinine 0.89, BUN 17, Potassium 4.3, Sodium 139 09/23/2015 Creatinine 0.73, BUN 14, Potassium 5.2, Sodium 138  Recommendations:  Message sent to Chanetta Marshall, NP to review fluid levels at 12/18 appointment.   Follow-up plan: ICM clinic phone appointment on 05/09/2017.  Office appointment scheduled 04/05/2017 with Chanetta Marshall, NP.  Copy of ICM check sent to Dr. Sallyanne Kuster and Dr. Gwenlyn Found.   3 month ICM trend: 03/29/2017    1 Year ICM trend:       Rosalene Billings, RN 03/31/2017 5:31 PM
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
## 2017-03-31 NOTE — Telephone Encounter (Signed)
Remote ICM transmission received.  Attempted call to wife and line busy x 2.

## 2017-04-01 NOTE — Progress Notes (Addendum)
  Returned call to wife as requested by voice mail and left detailed message regarding transmission.  No changes today and advised next ICM transmission will be 05/09/2017 and patient's device will be checked next week at appointment with Chanetta Marshall, NP.

## 2017-04-04 NOTE — Progress Notes (Signed)
Electrophysiology Office Note Date: 04/06/2017  ID:  Ian [name], DOB 06-02-1928, MRN [id number]  PCP: Venia Carbon, MD Primary Cardiologist: Berry/Croitoru Electrophysiologist: Allred  CC: Routine ICD follow-up  Ian [name] is a 83 y.o. male seen today for Ian [name].  He presents today for routine electrophysiology followup.  Since last being seen in our clinic, the patient reports doing relatively well.  He has stable shortness of breath on exertion. He also has ongoing issues with balance and has fallen recently.  He denies chest pain, palpitations, nausea, vomiting, syncope, edema, weight gain, or early satiety.     Device History: MDT dual chamber PPM implanted 2013 for symptomatic bradycardia; upgraded to STJ CRTP 2017   Past Medical History:  Diagnosis Date  . Aortic valve stenosis, acquired    a. s/p Porcine AVR 2001;  b. 09/2014 Dobut Echo: mod-sev bioprosthetic AS w/ minimal change in CO and only mild increase in gradients w/ dobutamine.   Marland Kitchen CAD (coronary artery disease)    a. s/p CABG with LIMA-LAD in 2001 with AVR. b. BMS to LCx 8/08. c. Abnl nuc/dec EF 05/2014 - s/p cath with patent LIMA to LAD, patent dominant right, mild LCx disease, patent stent. Med rx.  . Chronic combined systolic (congestive) and diastolic (congestive) heart failure (HCC)    a. EF 50-55% in 02/2013;  b. 05/2014 Echo: EF 20-25%, Gr1 DD.  Marland Kitchen Diverticulitis   . ED (erectile dysfunction)   . Essential hypertension   . GERD (gastroesophageal reflux disease)   . Hyperlipidemia   . MALT lymphoma ([place])   . Nonischemic cardiomyopathy ([place])    a. 05/2014 Echo: EF 20-25%-->f/u cath w/ nonobs dzs.  . Osteoarthritis   . Osteoporosis   . Parkinson's disease   . Renal artery stenosis (HCC)    a. 90% RRA stenosis previously. b. Duplex 09/2014: R renal artery 60-99%, L renal artery 1-59%.  . Symptomatic bradycardia    a. 01/2012 s/p MDT dual chamber PPM b. upgraded to STJ CRTP 2017  . TIA  (transient ischemic attack) 06/08   a. 09/2006   Past Surgical History:  Procedure Laterality Date  . AORTIC VALVE REPLACEMENT  2001   Porcine valve  . CARDIAC CATHETERIZATION  06/20/2014   Procedure: CORONARY/GRAFT ANGIOGRAPHY;  Surgeon: Lorretta Harp, MD;  Location: [facility] CATH LAB;  Service: Cardiovascular;;  . CATARACT EXTRACTION  06/09   left  . CORONARY ARTERY BYPASS GRAFT  2001   ( van trigt ) aortic valve replacement 2001; LIMA-LAD.  Marland Kitchen CORONARY STENT PLACEMENT  11/2007   8/09  Left Circumflex Stent by Ian Toy Care started and aggrenox stopped  . EP IMPLANTABLE DEVICE N/A 10/30/2015   Procedure: BiV Pacemaker Upgrade;  Surgeon: Thompson Grayer, MD;  Location: [facility] CV LAB;  Service: Cardiovascular;  Laterality: N/A;  . ESOPHAGOGASTRODUODENOSCOPY     multiple, Colon/ EGD benign 11/2004  . INGUINAL HERNIA REPAIR     LIH 1997Llap. bilateral hernias 1998  . LAPAROSCOPIC CHOLECYSTECTOMY  07/11   Ian.Rosenbower  . NM MYOCAR PERF WALL MOTION  11/22/2007   mild septal ischemia,EF 66%  . PERMANENT PACEMAKER INSERTION N/A 01/21/2012   Medtronic Adapta L implanted for complete heart block  . PILONIDAL CYST / SINUS EXCISION  1953  . SQUAMOUS CELL CARCINOMA EXCISION  3/13   back  . TOTAL HIP ARTHROPLASTY Left 01/06/2016   Procedure: TOTAL HIP ARTHROPLASTY ANTERIOR APPROACH;  Surgeon: Hessie Knows, MD;  Location: [facility] ORS;  Service: Orthopedics;  Laterality: Left;    Current Outpatient Medications  Medication Sig Dispense Refill  . acetaminophen (TYLENOL) 500 MG tablet Take 500 mg by mouth every 6 (six) hours as needed for mild pain.    Marland Kitchen aspirin 81 MG tablet Take 81 mg by mouth daily.    . bisoprolol-hydrochlorothiazide (ZIAC) 5-6.25 MG tablet Take 1 tablet by mouth daily. 90 tablet 2  . carbidopa-levodopa (SINEMET CR) 50-200 MG tablet TAKE 1 TABLET 5 TIMES DAILY (8am/11am/2pm/6pm/10pm) 450 tablet 1  . carbidopa-levodopa (SINEMET IR) 25-100 MG tablet TAKE 1 TABLET 3 TIMES A DAY  (8am/2pm/10pm) 270 tablet 1  . furosemide (LASIX) 80 MG tablet Take 80 mg by mouth daily.    Vladimir Faster Glycol-Propyl Glycol (SYSTANE ULTRA OP) Place 1 drop into both eyes daily.    . polyethylene glycol (MIRALAX / GLYCOLAX) packet Take 17 g by mouth daily.    . potassium chloride (K-DUR) 10 MEQ tablet Take 20 mEq daily by mouth.     . QUEtiapine (SEROQUEL) 25 MG tablet 2 in the AM, 1 at night 270 tablet 1  . vitamin B-12 (CYANOCOBALAMIN) 1000 MCG tablet Take 1,000 mcg by mouth daily.     No current facility-administered medications for this visit.     Allergies:   Plavix [clopidogrel]; Pravastatin; Zocor [simvastatin]; and Penicillins   Social History: Social History   Socioeconomic History  . Marital status: Married    Spouse name: Not on file  . Number of children: 3  . Years of education: Not on file  . Highest education level: Not on file  Social Needs  . Financial resource strain: Not on file  . Food insecurity - worry: Not on file  . Food insecurity - inability: Not on file  . Transportation needs - medical: Not on file  . Transportation needs - non-medical: Not on file  Occupational History  . Occupation: retired- [employer]  . Occupation:    Tobacco Use  . Smoking status: Former Smoker    Packs/day: 1.00    Years: 5.00    Pack years: 5.00    Types: Cigarettes    Last attempt to quit: 06/06/1963    Years since quitting: 53.8  . Smokeless tobacco: Never Used  Substance and Sexual Activity  . Alcohol use: No    Comment: very rare  . Drug use: No  . Sexual activity: No  Other Topics Concern  . Not on file  Social History Narrative   Has living will   Wife, then son [name], hold health care POA   Requests DNR --done   Requests no tube feeds if cognitively unaware    Family History: Family History  Problem Relation Age of Onset  . Asthma Father   . Heart disease Brother     Review of Systems: All other systems reviewed and are otherwise  negative except as noted above.   Physical Exam: VS:  BP (!) 136/55   Pulse 62   Ht 5\' 6"  (1.676 m)   Wt 147 lb 6.4 oz (66.9 kg)   SpO2 99%   BMI 23.79 kg/m  , BMI Body mass index is 23.79 kg/m.  GEN- The patient is elderly appearing, alert and oriented x 3 today.   HEENT: normocephalic, atraumatic; sclera clear, conjunctiva pink; hearing intact; oropharynx clear; neck supple  Lungs- Clear to ausculation bilaterally, normal work of breathing  Heart- Regular rate and rhythm  GI- soft, non-tender, non-distended, bowel sounds present  Extremities- no clubbing, cyanosis, or  edema  MS- no significant deformity or atrophy Skin- warm and dry, no rash or lesion; PPM pocket well healed Psych- euthymic mood, full affect Neuro- strength and sensation are intact  ICD interrogation- reviewed in detail today,  See PACEART report  EKG:  EKG is not ordered today.  Recent Labs: 09/14/2016: Hemoglobin 13.3; Platelets 371 03/04/2017: BUN 17; Creatinine, Ser 0.98; Potassium 4.5; Sodium 140   Wt Readings from Last 3 Encounters:  04/05/17 147 lb 6.4 oz (66.9 kg)  03/07/17 151 lb (68.5 kg)  03/04/17 147 lb (66.7 kg)     Other studies Reviewed: Additional studies/ records that were reviewed today include: cardiology and Ian Allred office notes  Assessment and Plan:  1.  Chronic systolic dysfunction/complete heart block  euvolemic today - EF normalized post CRT upgrade  Stable on an appropriate medical regimen Normal CRTP function See Pace Art report No changes today Continue follow up in ICM clinic  We discussed prn extra Lasix for weight gain, increased shortness of breath.  He is aware to call the office if he is having to take more than 1 extra lasix/week.   Current medicines are reviewed at length with the patient today.   The patient does not have concerns regarding his medicines.  The following changes were made today:  none  Labs/ tests ordered today include: none Orders Placed  This Encounter  Procedures  . CUP PACEART INCLINIC DEVICE CHECK     Disposition:   Follow up with Delilah Shan, Ian [name] 1 year, general cardiology as scheduled    Signed, Chanetta Marshall, NP 04/06/2017 2:29 PM  [place] [address] [place] [address] [phone] (office) [phone] (fax)

## 2017-04-05 ENCOUNTER — Encounter: Payer: Self-pay | Admitting: Nurse Practitioner

## 2017-04-05 ENCOUNTER — Ambulatory Visit (INDEPENDENT_AMBULATORY_CARE_PROVIDER_SITE_OTHER): Payer: Medicare Other | Admitting: Nurse Practitioner

## 2017-04-05 VITALS — BP 136/55 | HR 62 | Ht 66.0 in | Wt 147.4 lb

## 2017-04-05 DIAGNOSIS — I442 Atrioventricular block, complete: Secondary | ICD-10-CM

## 2017-04-05 DIAGNOSIS — I5022 Chronic systolic (congestive) heart failure: Secondary | ICD-10-CM

## 2017-04-05 DIAGNOSIS — I701 Atherosclerosis of renal artery: Secondary | ICD-10-CM

## 2017-04-05 LAB — CUP PACEART INCLINIC DEVICE CHECK
Date Time Interrogation Session: 20181218105021
Implantable Lead Implant Date: 20170713
Implantable Lead Location: 753860
Implantable Lead Model: 5076
Implantable Pulse Generator Implant Date: 20170713
MDC IDC LEAD IMPLANT DT: 20131004
MDC IDC LEAD IMPLANT DT: 20131004
MDC IDC LEAD LOCATION: 753858
MDC IDC LEAD LOCATION: 753859
MDC IDC PG SERIAL: 7933792
Pulse Gen Model: 3262

## 2017-04-05 NOTE — Patient Instructions (Addendum)
Medication Instructions:   Your physician recommends that you continue on your current medications as directed. Please refer to the Current Medication list given to you today.   If you need a refill on your cardiac medications before your next appointment, please call your pharmacy.  Labwork: NONE ORDERED  TODAY    Testing/Procedures: NONE ORDERED  TODAY    Follow-Up:  Your physician wants you to follow-up in: [place] will receive a reminder letter in the mail two months in advance. If you don't receive a letter, please call our office to schedule the follow-up appointment.    Remote monitoring is used to monitor your Pacemaker of ICD from home. This monitoring reduces the number of office visits required to check your device to one time per year. It allows [place] to keep an eye on the functioning of your device to ensure it is working properly. You are scheduled for a device check from home on .  04-2017 You may send your transmission at any time that day. If you have a wireless device, the transmission will be sent automatically. After your physician reviews your transmission, you will receive a postcard with your next transmission date.     Any Other Special Instructions Will Be Listed Below (If Applicable).

## 2017-04-06 ENCOUNTER — Encounter: Payer: Medicare Other | Admitting: Nurse Practitioner

## 2017-04-27 ENCOUNTER — Inpatient Hospital Stay (HOSPITAL_COMMUNITY)
Admission: EM | Admit: 2017-04-27 | Discharge: 2017-04-29 | DRG: 641 | Disposition: A | Discharge status: Skilled Nursing Facility | Payer: Medicare Other | Attending: Internal Medicine | Admitting: Internal Medicine

## 2017-04-27 ENCOUNTER — Emergency Department (HOSPITAL_COMMUNITY): Payer: Medicare Other

## 2017-04-27 ENCOUNTER — Encounter (HOSPITAL_COMMUNITY): Payer: Self-pay | Admitting: *Deleted

## 2017-04-27 ENCOUNTER — Ambulatory Visit: Payer: Medicare Other

## 2017-04-27 ENCOUNTER — Telehealth: Payer: Self-pay

## 2017-04-27 DIAGNOSIS — F062 Psychotic disorder with delusions due to known physiological condition: Secondary | ICD-10-CM | POA: Diagnosis present

## 2017-04-27 DIAGNOSIS — G20 Parkinson's disease: Secondary | ICD-10-CM | POA: Diagnosis not present

## 2017-04-27 DIAGNOSIS — I5042 Chronic combined systolic (congestive) and diastolic (congestive) heart failure: Secondary | ICD-10-CM | POA: Diagnosis not present

## 2017-04-27 DIAGNOSIS — W1830XA Fall on same level, unspecified, initial encounter: Secondary | ICD-10-CM | POA: Diagnosis present

## 2017-04-27 DIAGNOSIS — Z953 Presence of xenogenic heart valve: Secondary | ICD-10-CM

## 2017-04-27 DIAGNOSIS — I2510 Atherosclerotic heart disease of native coronary artery without angina pectoris: Secondary | ICD-10-CM | POA: Diagnosis present

## 2017-04-27 DIAGNOSIS — Z79899 Other long term (current) drug therapy: Secondary | ICD-10-CM

## 2017-04-27 DIAGNOSIS — Z950 Presence of cardiac pacemaker: Secondary | ICD-10-CM

## 2017-04-27 DIAGNOSIS — S0102XA Laceration with foreign body of scalp, initial encounter: Secondary | ICD-10-CM | POA: Diagnosis not present

## 2017-04-27 DIAGNOSIS — M79652 Pain in left thigh: Secondary | ICD-10-CM | POA: Diagnosis not present

## 2017-04-27 DIAGNOSIS — K219 Gastro-esophageal reflux disease without esophagitis: Secondary | ICD-10-CM | POA: Diagnosis present

## 2017-04-27 DIAGNOSIS — S79912A Unspecified injury of left hip, initial encounter: Secondary | ICD-10-CM | POA: Diagnosis not present

## 2017-04-27 DIAGNOSIS — Z951 Presence of aortocoronary bypass graft: Secondary | ICD-10-CM

## 2017-04-27 DIAGNOSIS — R627 Adult failure to thrive: Secondary | ICD-10-CM | POA: Diagnosis not present

## 2017-04-27 DIAGNOSIS — S098XXA Other specified injuries of head, initial encounter: Secondary | ICD-10-CM | POA: Diagnosis not present

## 2017-04-27 DIAGNOSIS — Z66 Do not resuscitate: Secondary | ICD-10-CM | POA: Diagnosis present

## 2017-04-27 DIAGNOSIS — W19XXXD Unspecified fall, subsequent encounter: Secondary | ICD-10-CM

## 2017-04-27 DIAGNOSIS — Y998 Other external cause status: Secondary | ICD-10-CM | POA: Diagnosis not present

## 2017-04-27 DIAGNOSIS — S0181XA Laceration without foreign body of other part of head, initial encounter: Secondary | ICD-10-CM | POA: Diagnosis not present

## 2017-04-27 DIAGNOSIS — W01198A Fall on same level from slipping, tripping and stumbling with subsequent striking against other object, initial encounter: Secondary | ICD-10-CM | POA: Diagnosis not present

## 2017-04-27 DIAGNOSIS — M1990 Unspecified osteoarthritis, unspecified site: Secondary | ICD-10-CM | POA: Diagnosis present

## 2017-04-27 DIAGNOSIS — I701 Atherosclerosis of renal artery: Secondary | ICD-10-CM | POA: Diagnosis present

## 2017-04-27 DIAGNOSIS — S79922A Unspecified injury of left thigh, initial encounter: Secondary | ICD-10-CM | POA: Diagnosis not present

## 2017-04-27 DIAGNOSIS — R262 Difficulty in walking, not elsewhere classified: Secondary | ICD-10-CM | POA: Diagnosis not present

## 2017-04-27 DIAGNOSIS — Z955 Presence of coronary angioplasty implant and graft: Secondary | ICD-10-CM

## 2017-04-27 DIAGNOSIS — T148XXA Other injury of unspecified body region, initial encounter: Secondary | ICD-10-CM | POA: Diagnosis not present

## 2017-04-27 DIAGNOSIS — M25552 Pain in left hip: Secondary | ICD-10-CM | POA: Diagnosis not present

## 2017-04-27 DIAGNOSIS — S0003XA Contusion of scalp, initial encounter: Secondary | ICD-10-CM | POA: Diagnosis not present

## 2017-04-27 DIAGNOSIS — E860 Dehydration: Secondary | ICD-10-CM | POA: Diagnosis not present

## 2017-04-27 DIAGNOSIS — I110 Hypertensive heart disease with heart failure: Secondary | ICD-10-CM | POA: Diagnosis not present

## 2017-04-27 DIAGNOSIS — Z8673 Personal history of transient ischemic attack (TIA), and cerebral infarction without residual deficits: Secondary | ICD-10-CM

## 2017-04-27 DIAGNOSIS — S0101XA Laceration without foreign body of scalp, initial encounter: Secondary | ICD-10-CM | POA: Diagnosis not present

## 2017-04-27 DIAGNOSIS — I350 Nonrheumatic aortic (valve) stenosis: Secondary | ICD-10-CM | POA: Diagnosis present

## 2017-04-27 DIAGNOSIS — Z880 Allergy status to penicillin: Secondary | ICD-10-CM

## 2017-04-27 DIAGNOSIS — Z87891 Personal history of nicotine dependence: Secondary | ICD-10-CM

## 2017-04-27 DIAGNOSIS — Z7982 Long term (current) use of aspirin: Secondary | ICD-10-CM

## 2017-04-27 DIAGNOSIS — M810 Age-related osteoporosis without current pathological fracture: Secondary | ICD-10-CM | POA: Diagnosis present

## 2017-04-27 DIAGNOSIS — Z96642 Presence of left artificial hip joint: Secondary | ICD-10-CM | POA: Diagnosis present

## 2017-04-27 DIAGNOSIS — Y92009 Unspecified place in unspecified non-institutional (private) residence as the place of occurrence of the external cause: Secondary | ICD-10-CM

## 2017-04-27 DIAGNOSIS — Z888 Allergy status to other drugs, medicaments and biological substances status: Secondary | ICD-10-CM

## 2017-04-27 LAB — CBC WITH DIFFERENTIAL/PLATELET
Basophils Absolute: 0 10*3/uL (ref 0.0–0.1)
Basophils Relative: 0 %
Eosinophils Absolute: 0 10*3/uL (ref 0.0–0.7)
Eosinophils Relative: 0 %
HEMATOCRIT: 35.6 % — AB (ref 39.0–52.0)
HEMOGLOBIN: 12.1 g/dL — AB (ref 13.0–17.0)
LYMPHS ABS: 1.5 10*3/uL (ref 0.7–4.0)
LYMPHS PCT: 13 %
MCH: 32 pg (ref 26.0–34.0)
MCHC: 34 g/dL (ref 30.0–36.0)
MCV: 94.2 fL (ref 78.0–100.0)
MONO ABS: 0.9 10*3/uL (ref 0.1–1.0)
MONOS PCT: 8 %
NEUTROS ABS: 8.6 10*3/uL — AB (ref 1.7–7.7)
Neutrophils Relative %: 79 %
Platelets: 261 10*3/uL (ref 150–400)
RBC: 3.78 MIL/uL — ABNORMAL LOW (ref 4.22–5.81)
RDW: 13.3 % (ref 11.5–15.5)
WBC: 11 10*3/uL — ABNORMAL HIGH (ref 4.0–10.5)

## 2017-04-27 LAB — CK: Total CK: 33 U/L — ABNORMAL LOW (ref 49–397)

## 2017-04-27 LAB — MAGNESIUM: MAGNESIUM: 2.1 mg/dL (ref 1.7–2.4)

## 2017-04-27 MED ORDER — POLYVINYL ALCOHOL 1.4 % OP SOLN
1.0000 [drp] | Freq: Every day | OPHTHALMIC | Status: DC
  Administered 2017-04-28 – 2017-04-29 (×2): 1 [drp] via OPHTHALMIC
  Filled 2017-04-27: qty 15

## 2017-04-27 MED ORDER — HEPARIN SODIUM (PORCINE) 5000 UNIT/ML IJ SOLN
5000.0000 [IU] | Freq: Three times a day (TID) | INTRAMUSCULAR | Status: DC
  Administered 2017-04-27 – 2017-04-29 (×5): 5000 [IU] via SUBCUTANEOUS
  Filled 2017-04-27 (×4): qty 1

## 2017-04-27 MED ORDER — ONDANSETRON HCL 4 MG/2ML IJ SOLN: 4.0000 mg | Freq: Four times a day (QID) | INTRAMUSCULAR | Status: DC | PRN

## 2017-04-27 MED ORDER — ONDANSETRON HCL 4 MG PO TABS: 4.0000 mg | ORAL_TABLET | Freq: Four times a day (QID) | ORAL | Status: DC | PRN

## 2017-04-27 MED ORDER — POTASSIUM CHLORIDE ER 10 MEQ PO TBCR
20.0000 meq | EXTENDED_RELEASE_TABLET | Freq: Every day | ORAL | Status: DC
  Filled 2017-04-27: qty 2

## 2017-04-27 MED ORDER — PANTOPRAZOLE SODIUM 40 MG PO TBEC
40.0000 mg | DELAYED_RELEASE_TABLET | Freq: Every day | ORAL | Status: DC
  Administered 2017-04-28 – 2017-04-29 (×2): 40 mg via ORAL
  Filled 2017-04-27 (×2): qty 1

## 2017-04-27 MED ORDER — CARBIDOPA-LEVODOPA ER 50-200 MG PO TBCR
1.0000 | EXTENDED_RELEASE_TABLET | ORAL | Status: DC
Start: 2017-04-27 — End: 2017-04-29
  Administered 2017-04-27 – 2017-04-29 (×7): 1 via ORAL
  Filled 2017-04-27 (×9): qty 1

## 2017-04-27 MED ORDER — BISOPROLOL-HYDROCHLOROTHIAZIDE 5-6.25 MG PO TABS
1.0000 | ORAL_TABLET | Freq: Every day | ORAL | Status: DC
Start: 2017-04-27 — End: 2017-04-28
  Administered 2017-04-28: 1 via ORAL
  Filled 2017-04-27 (×2): qty 1

## 2017-04-27 MED ORDER — ASPIRIN EC 81 MG PO TBEC
81.0000 mg | DELAYED_RELEASE_TABLET | Freq: Every day | ORAL | Status: DC
  Administered 2017-04-28 – 2017-04-29 (×2): 81 mg via ORAL
  Filled 2017-04-27 (×2): qty 1

## 2017-04-27 MED ORDER — FUROSEMIDE 40 MG PO TABS
80.0000 mg | ORAL_TABLET | Freq: Every day | ORAL | Status: DC
  Administered 2017-04-28 – 2017-04-29 (×2): 80 mg via ORAL
  Filled 2017-04-27 (×2): qty 2

## 2017-04-27 MED ORDER — POLYETHYLENE GLYCOL 3350 17 G PO PACK
17.0000 g | PACK | Freq: Every day | ORAL | Status: DC
  Administered 2017-04-28 – 2017-04-29 (×2): 17 g via ORAL
  Filled 2017-04-27 (×2): qty 1

## 2017-04-27 MED ORDER — CARBIDOPA-LEVODOPA 25-100 MG PO TABS
1.0000 | ORAL_TABLET | Freq: Three times a day (TID) | ORAL | Status: DC
  Administered 2017-04-27 – 2017-04-29 (×5): 1 via ORAL
  Filled 2017-04-27 (×4): qty 1

## 2017-04-27 MED ORDER — POTASSIUM CHLORIDE CRYS ER 10 MEQ PO TBCR
20.0000 meq | EXTENDED_RELEASE_TABLET | Freq: Every day | ORAL | Status: DC
  Administered 2017-04-28 – 2017-04-29 (×2): 20 meq via ORAL
  Filled 2017-04-27 (×2): qty 2

## 2017-04-27 MED ORDER — ACETAMINOPHEN 500 MG PO TABS
500.0000 mg | ORAL_TABLET | Freq: Four times a day (QID) | ORAL | Status: DC | PRN
  Administered 2017-04-28 – 2017-04-29 (×2): 500 mg via ORAL
  Filled 2017-04-27 (×2): qty 1

## 2017-04-27 MED ORDER — VITAMIN B-12 1000 MCG PO TABS
1000.0000 ug | ORAL_TABLET | Freq: Every day | ORAL | Status: DC
  Administered 2017-04-28 – 2017-04-29 (×2): 1000 ug via ORAL
  Filled 2017-04-27 (×2): qty 1

## 2017-04-27 MED ORDER — QUETIAPINE FUMARATE 50 MG PO TABS
50.0000 mg | ORAL_TABLET | Freq: Every morning | ORAL | Status: DC
  Administered 2017-04-28 – 2017-04-29 (×2): 50 mg via ORAL
  Filled 2017-04-27 (×2): qty 1

## 2017-04-27 NOTE — Telephone Encounter (Signed)
Copied from [place] [id number]. Topic: Quick Communication - See Telephone Encounter >> Apr 27, 2017 12:44 PM Corie Chiquito, [place] wrote: CRM for notification. See Telephone encounter for: Neoma Laming called because they need a Sl2 Form from [facility]. Stated that patient had a fall and will be going to the hospital and when he is discharger from the hospital he will have to go into a skilled nursing home.If someone could give her a call back at [phone]  04/27/17.

## 2017-04-27 NOTE — ED Notes (Signed)
ED TO INPATIENT HANDOFF REPORT  Name/Age/Gender Ian [name] 84 y.o. male  Code Status Code Status History    Date Active Date Inactive Code Status Order ID Comments User Context   01/06/2016 02:44 01/10/2016 17:58 Full Code [id number]  Saundra Shelling, MD ED   10/30/2015 09:55 10/30/2015 20:33 Full Code [id number]  Thompson Grayer, MD Inpatient   06/10/2015 11:26 10/30/2015 09:55 DNR [id number]  Newt Minion, RN Outpatient   06/20/2014 17:10 06/21/2014 17:22 Full Code [id number]  Lorretta Harp, MD Inpatient   01/21/2012 18:49 01/22/2012 15:33 Full Code [id number]  Willette Pa, RN Inpatient    Advance Directive Documentation     Most Recent Value  Type of Advance Directive  Healthcare Power of Attorney, Living will, Out of facility DNR (pink MOST or yellow form)  Pre-existing out of facility DNR order (yellow form or pink MOST form)  No data  "MOST" Form in Place?  No data      Home/SNF/Other Nursing Home  Chief Complaint Lf Hip Pain; Fall  Level of Care/Admitting Diagnosis ED Disposition    ED Disposition Condition Comment   Admit  Hospital Area: [facility] [[id number]]  Level of Care: Med-Surg [16]  Diagnosis: Unable to walk [097353]  Admitting Physician: Elwin Mocha [[id number]]  Attending Physician: Elwin Mocha [[id number]]  PT Class (Do Not Modify): Observation [104]  PT Acc Code (Do Not Modify): Observation [10022]       Medical History Past Medical History:  Diagnosis Date  . Aortic valve stenosis, acquired    a. s/p Porcine AVR 2001;  b. 09/2014 Dobut Echo: mod-sev bioprosthetic AS w/ minimal change in CO and only mild increase in gradients w/ dobutamine.   Marland Kitchen CAD (coronary artery disease)    a. s/p CABG with LIMA-LAD in 2001 with AVR. b. BMS to LCx 8/08. c. Abnl nuc/dec EF 05/2014 - s/p cath with patent LIMA to LAD, patent dominant right, mild LCx disease, patent stent. Med rx.  . Chronic combined systolic (congestive) and diastolic  (congestive) heart failure (HCC)    a. EF 50-55% in 02/2013;  b. 05/2014 Echo: EF 20-25%, Gr1 DD.  Marland Kitchen Diverticulitis   . ED (erectile dysfunction)   . Essential hypertension   . GERD (gastroesophageal reflux disease)   . Hyperlipidemia   . MALT lymphoma ([place])   . Nonischemic cardiomyopathy ([place])    a. 05/2014 Echo: EF 20-25%-->f/u cath w/ nonobs dzs.  . Osteoarthritis   . Osteoporosis   . Parkinson's disease   . Renal artery stenosis (HCC)    a. 90% RRA stenosis previously. b. Duplex 09/2014: R renal artery 60-99%, L renal artery 1-59%.  . Symptomatic bradycardia    a. 01/2012 s/p MDT dual chamber PPM b. upgraded to STJ CRTP 2017  . TIA (transient ischemic attack) 06/08   a. 09/2006    Allergies Allergies  Allergen Reactions  . Plavix [Clopidogrel]     Fatigue   . Pravastatin     Muscle aches  . Zocor [Simvastatin]     REACTION: dizziness  . Penicillins Rash    Has patient had a PCN reaction causing immediate rash, facial/tongue/throat swelling, SOB or lightheadedness with hypotension: YES Has patient had a PCN reaction causing severe rash involving mucus membranes or skin necrosis: NO Has patient had a PCN reaction that required hospitalization NO Has patient had a PCN reaction occurring within the last 10 years: NO If all of the above answers are "NO", then may proceed  with Cephalosporin use.    IV Location/Drains/Wounds Patient Lines/Drains/Airways Status   Active Line/Drains/Airways    Name:   Placement date:   Placement time:   Site:   Days:   Incision (Closed) 01/06/16 Hip Left   01/06/16    2157     477          Labs/Imaging Results for orders placed or performed during the hospital encounter of 04/27/17 (from the past 48 hour(s))  CBC with Differential/Platelet     Status: Abnormal   Collection Time: 04/27/17  7:40 PM  Result Value Ref Range   WBC 11.0 (H) 4.0 - 10.5 K/uL   RBC 3.78 (L) 4.22 - 5.81 MIL/uL   Hemoglobin 12.1 (L) 13.0 - 17.0 g/dL   HCT 35.6  (L) 39.0 - 52.0 %   MCV 94.2 78.0 - 100.0 fL   MCH 32.0 26.0 - 34.0 pg   MCHC 34.0 30.0 - 36.0 g/dL   RDW 13.3 11.5 - 15.5 %   Platelets 261 150 - 400 K/uL   Neutrophils Relative % 79 %   Neutro Abs 8.6 (H) 1.7 - 7.7 K/uL   Lymphocytes Relative 13 %   Lymphs Abs 1.5 0.7 - 4.0 K/uL   Monocytes Relative 8 %   Monocytes Absolute 0.9 0.1 - 1.0 K/uL   Eosinophils Relative 0 %   Eosinophils Absolute 0.0 0.0 - 0.7 K/uL   Basophils Relative 0 %   Basophils Absolute 0.0 0.0 - 0.1 K/uL  CK     Status: Abnormal   Collection Time: 04/27/17  7:40 PM  Result Value Ref Range   Total CK 33 (L) 49 - 397 U/L  Magnesium     Status: None   Collection Time: 04/27/17  7:40 PM  Result Value Ref Range   Magnesium 2.1 1.7 - 2.4 mg/dL   Dg Femur Min 2 Views Left  Result Date: 04/27/2017 CLINICAL DATA:  Left upper leg pain after fall. EXAM: LEFT FEMUR 2 VIEWS COMPARISON:  Left hip x-rays dated January 06, 2016. FINDINGS: Prior left hip hemiarthroplasty. No acute fracture or malalignment. Thin, linear lucency along the anterior margin of the proximal femur likely represents a nutrient foramen. No knee joint effusion. IMPRESSION: Prior left hip hemiarthroplasty.  No fracture or dislocation. Electronically Signed   By: Titus Dubin M.D.   On: 04/27/2017 17:09    Pending Labs Unresulted Labs (From admission, onward)   None      Vitals/Pain Today's Vitals   04/27/17 1425 04/27/17 1504 04/27/17 1642 04/27/17 1919  BP:   (!) 136/50 (!) 119/57  Pulse:   67 76  Resp:   18 18  Temp:      TempSrc:      SpO2:   92% 95%  PainSc: 10-Worst pain ever 2       Isolation Precautions No active isolations  Medications Medications - No data to display  Mobility non-ambulatory today

## 2017-04-27 NOTE — ED Notes (Signed)
Patient transported to X-ray 

## 2017-04-27 NOTE — ED Notes (Signed)
Ice applied to hip.

## 2017-04-27 NOTE — Telephone Encounter (Signed)
Contact information below is incorrect.  Spoke with [name], patient's wife via her cell, he is currently being transferred to [facility] as he is having an "issue" with his right leg.  Patient was seen earlier today in ER in [facility] after initial fall and treated for his head injury.  Patient states that [facility] is demanding an FL2 to be sent today so they can accept him for admission as soon as he is released.  I discussed with Dr. Silvio Pate and let wife know that at this point it would be best to wait and see what happens with the hospital visit and have them complete the necessary FL2.  Patient's wife is going to let them know and call me back if there are any problems.  [name] was given my direct number to call and follow up.

## 2017-04-27 NOTE — Telephone Encounter (Signed)
[place] [facility] called to provide the fax number for when [facility] will be available. The fax number is  AttnLoree Fee [phone]

## 2017-04-27 NOTE — H&P (Signed)
[facility] History and Physical  Ian [name] JKD:[id number] DOB: 1929/02/23 DOA: 04/27/2017  Referring physician:  PCP: Venia Carbon, MD   Chief Complaint: "He could not get up." - wife  HPI: Ian [name] is a 84 y.o. male with past medical history significant for Parkinson's, heart failure and high blood pressure presents with weakness.  Patient recently seen in the emergency room after a fall.  This was at [facility]  Patient was discharged home.  At the hospital patient had a workup including imaging.  He was cleared and sent home.  Later at home patient could not stand.  Ended up crawling on the floor.  Rescue squad was called.  Patient brought back to the hospital.  At this time to [facility].  ED course: Patient recommended for admission.   Review of Systems:  As per HPI otherwise 10 point review of systems negative.    Past Medical History:  Diagnosis Date  . Aortic valve stenosis, acquired    a. s/p Porcine AVR 2001;  b. 09/2014 Dobut Echo: mod-sev bioprosthetic AS w/ minimal change in CO and only mild increase in gradients w/ dobutamine.   Marland Kitchen CAD (coronary artery disease)    a. s/p CABG with LIMA-LAD in 2001 with AVR. b. BMS to LCx 8/08. c. Abnl nuc/dec EF 05/2014 - s/p cath with patent LIMA to LAD, patent dominant right, mild LCx disease, patent stent. Med rx.  . Chronic combined systolic (congestive) and diastolic (congestive) heart failure (HCC)    a. EF 50-55% in 02/2013;  b. 05/2014 Echo: EF 20-25%, Gr1 DD.  Marland Kitchen Diverticulitis   . ED (erectile dysfunction)   . Essential hypertension   . GERD (gastroesophageal reflux disease)   . Hyperlipidemia   . MALT lymphoma ([place])   . Nonischemic cardiomyopathy ([place])    a. 05/2014 Echo: EF 20-25%-->f/u cath w/ nonobs dzs.  . Osteoarthritis   . Osteoporosis   . Parkinson's disease   . Renal artery stenosis (HCC)    a. 90% RRA stenosis previously. b. Duplex 09/2014: R renal artery 60-99%, L renal artery  1-59%.  . Symptomatic bradycardia    a. 01/2012 s/p MDT dual chamber PPM b. upgraded to STJ CRTP 2017  . TIA (transient ischemic attack) 06/08   a. 09/2006   Past Surgical History:  Procedure Laterality Date  . AORTIC VALVE REPLACEMENT  2001   Porcine valve  . CARDIAC CATHETERIZATION  06/20/2014   Procedure: CORONARY/GRAFT ANGIOGRAPHY;  Surgeon: Lorretta Harp, MD;  Location: [facility] CATH LAB;  Service: Cardiovascular;;  . CATARACT EXTRACTION  06/09   left  . CORONARY ARTERY BYPASS GRAFT  2001   ( van trigt ) aortic valve replacement 2001; LIMA-LAD.  Marland Kitchen CORONARY STENT PLACEMENT  11/2007   8/09  Left Circumflex Stent by Dr Toy Care started and aggrenox stopped  . EP IMPLANTABLE DEVICE N/A 10/30/2015   Procedure: BiV Pacemaker Upgrade;  Surgeon: Thompson Grayer, MD;  Location: [facility];  Service: Cardiovascular;  Laterality: N/A;  . ESOPHAGOGASTRODUODENOSCOPY     multiple, Colon/ EGD benign 11/2004  . INGUINAL HERNIA REPAIR     LIH 1997Llap. bilateral hernias 1998  . LAPAROSCOPIC CHOLECYSTECTOMY  07/11   Dr.Rosenbower  . NM MYOCAR PERF WALL MOTION  11/22/2007   mild septal ischemia,EF 66%  . PERMANENT PACEMAKER INSERTION N/A 01/21/2012   Medtronic Adapta L implanted for complete heart block  . PILONIDAL CYST / SINUS EXCISION  1953  . SQUAMOUS CELL CARCINOMA  EXCISION  3/13   back  . TOTAL HIP ARTHROPLASTY Left 01/06/2016   Procedure: TOTAL HIP ARTHROPLASTY ANTERIOR APPROACH;  Surgeon: Hessie Knows, MD;  Location: [facility] ORS;  Service: Orthopedics;  Laterality: Left;   Social History:  reports that he quit smoking about 53 years ago. His smoking use included cigarettes. He has a 5.00 pack-year smoking history. he has never used smokeless tobacco. He reports that he does not drink alcohol or use drugs.  Allergies  Allergen Reactions  . Plavix [Clopidogrel]     Fatigue   . Pravastatin     Muscle aches  . Zocor [Simvastatin]     REACTION: dizziness  . Penicillins Rash    Has  patient had a PCN reaction causing immediate rash, facial/tongue/throat swelling, SOB or lightheadedness with hypotension: YES Has patient had a PCN reaction causing severe rash involving mucus membranes or skin necrosis: NO Has patient had a PCN reaction that required hospitalization NO Has patient had a PCN reaction occurring within the last 10 years: NO If all of the above answers are "NO", then may proceed with Cephalosporin use.    Family History  Problem Relation Age of Onset  . Asthma Father   . Heart disease Brother      Prior to Admission medications   Medication Sig Start Date End Date Taking? Authorizing Provider  acetaminophen (TYLENOL) 500 MG tablet Take 500 mg by mouth every 6 (six) hours as needed for mild pain.   Yes [provider]  aspirin 81 MG tablet Take 81 mg by mouth daily.   Yes [provider]  bisoprolol-hydrochlorothiazide (ZIAC) 5-6.25 MG tablet Take 1 tablet by mouth daily. 01/03/17  Yes Lorretta Harp, MD  carbidopa-levodopa (SINEMET CR) 50-200 MG tablet TAKE 1 TABLET 5 TIMES DAILY (8am/11am/2pm/6pm/10pm) 11/04/16  Yes Tat, Eustace Quail, DO  carbidopa-levodopa (SINEMET IR) 25-100 MG tablet TAKE 1 TABLET 3 TIMES A DAY (8am/2pm/10pm) 11/04/16  Yes Tat, Eustace Quail, DO  furosemide (LASIX) 80 MG tablet Take 80 mg by mouth daily.   Yes [provider]  pantoprazole (PROTONIX) 40 MG tablet Take 40 mg by mouth daily. 03/04/17  Yes [provider]  Polyethyl Glycol-Propyl Glycol (SYSTANE ULTRA OP) Place 1 drop into both eyes daily.   Yes [provider]  polyethylene glycol (MIRALAX / GLYCOLAX) packet Take 17 g by mouth daily.   Yes [provider]  potassium chloride (K-DUR) 10 MEQ tablet Take 20 mEq daily by mouth.    Yes [provider]  QUEtiapine (SEROQUEL) 25 MG tablet 2 in the AM, 1 at night 03/07/17  Yes Tat, Eustace Quail, DO  vitamin B-12 (CYANOCOBALAMIN) 1000 MCG tablet Take 1,000 mcg by mouth daily.    Yes [provider]   Physical Exam: Vitals:   04/27/17 1404 04/27/17 1642 04/27/17 1919 04/27/17 2143  BP: (!) 130/55 (!) 136/50 (!) 119/57 (!) 139/53  Pulse: 65 67 76 73  Resp: 18 18 18 17   Temp: 98 F (36.7 C)   97.8 F (36.6 C)  TempSrc: Oral   Oral  SpO2: 95% 92% 95% 94%    Wt Readings from Last 3 Encounters:  04/05/17 66.9 kg (147 lb 6.4 oz)  03/07/17 68.5 kg (151 lb)  03/04/17 66.7 kg (147 lb)    General:  Appears calm and comfortable; a&Ox1 Eyes:  PERRL, EOMI, normal lids, iris ENT:  grossly normal hearing, lips & tongue Neck:  no LAD, masses or thyromegaly Cardiovascular:  RRR, no m/r/g. No  LE edema.  Respiratory:  CTA bilaterally, no w/r/r. Normal respiratory effort. Abdomen:  soft, ntnd Skin:  no rash or induration seen on limited exam; stitches in scalp Musculoskeletal:  grossly normal tone BUE/BLE Psychiatric:  grossly normal mood and affect, speech fluent and appropriate Neurologic:  CN 2-12 grossly intact, moves all extremities in coordinated fashion. Tremor. Tonguing.           Labs on Admission:  Basic Metabolic Panel: Recent Labs  Lab 04/27/17 1940  MG 2.1   Liver Function Tests: No results for input(s): AST, ALT, ALKPHOS, BILITOT, PROT, ALBUMIN in the last 168 hours. No results for input(s): LIPASE, AMYLASE in the last 168 hours. No results for input(s): AMMONIA in the last 168 hours. CBC: Recent Labs  Lab 04/27/17 1940  WBC 11.0*  NEUTROABS 8.6*  HGB 12.1*  HCT 35.6*  MCV 94.2  PLT 261   Cardiac Enzymes: Recent Labs  Lab 04/27/17 1940  CKTOTAL 33*    BNP (last 3 results) No results for input(s): BNP in the last 8760 hours.  ProBNP (last 3 results) No results for input(s): PROBNP in the last 8760 hours.   Creatinine clearance cannot be calculated (Patient's most recent lab result is older than the maximum 21 days allowed.)  CBG: No results for input(s): GLUCAP in the last 168 hours.  Radiological Exams on  Admission: Dg Femur Min 2 Views Left  Result Date: 04/27/2017 CLINICAL DATA:  Left upper leg pain after fall. EXAM: LEFT FEMUR 2 VIEWS COMPARISON:  Left hip x-rays dated January 06, 2016. FINDINGS: Prior left hip hemiarthroplasty. No acute fracture or malalignment. Thin, linear lucency along the anterior margin of the proximal femur likely represents a nutrient foramen. No knee joint effusion. IMPRESSION: Prior left hip hemiarthroplasty.  No fracture or dislocation. Electronically Signed   By: Titus Dubin M.D.   On: 04/27/2017 17:09    EKG: no new ekg  Assessment/Plan Active Problems:   Unable to walk  Unable to walk Likely due to recent fall and combination of Parkinson's disease PT in AM Social work to try to help with placement   TIA hx Cont asa qd  Hypertension When necessary hydralazine 10 mg IV as needed for severe blood pressure Cont Ziac  Parkinson At baseline patient has hallucinations and delusions associated with his Parkinson's Cont sinemet and serroquel  GERD Cont PPI  Code Status: DNR  DVT Prophylaxis: heparin Family Communication: wife at bedside Disposition Plan: Pending Improvement  Status: obs medsurg  Elwin Mocha, MD Family Medicine [facility] [email or web address] Password [email or web address]

## 2017-04-27 NOTE — ED Notes (Signed)
Hospitalist at bedside 

## 2017-04-27 NOTE — ED Provider Notes (Signed)
[facility] DEPT Provider Note   CSN: [id number] Arrival date & time: 04/27/17  1341     History   Chief Complaint Chief Complaint  Patient presents with  . Hip Pain    HPI Ian [name] is a 84 y.o. male with a h/o of Parkinson's disease who presents to the emergency department from [place] with a chief complaint of unwitnessed fall.  The patient's wife reports that she was in the other room when she heard a thud and was by the patient's side and approximately 5 seconds.  She reports that when she found the patient that he was lying on the floor with his head partially under a table. The patient reports he fell from standing after getting his left foot caught.  The patient's wife reports that they called security at the facility where they live who called 911.  The patient was taken to [facility] where he had a head CT and hip x-rays, which were both negative.  He had a scalp laceration that was repaired during the visit. No blood thinners.   The patient's wife reports that they contacted the patient's PCP after the visit for an FL2 form. The patient had not been seen by Dr. Silvio Pate, his PCP, in some time and would need a visit prior to paperwork being completed. The patient reports continued left lower extremity pain and presented for evaluation in the ED. He denies left knee, ankle, or hip pain. No visual changes, nausea, or emesis.  His wife states that he has not been able to stand or ambulate since the fall, and she feels that she would be unable to care for him in his current condition.  He states that he attempted to scoot on his bottom along the floor after they returned home from the ED visit earlier today, but was unsuccessful.  He ambulates without assistance at home, but has a cane and a walker that he uses when he ambulates in public.   The history is provided by the patient, the spouse and medical records. No language interpreter was  used.    Past Medical History:  Diagnosis Date  . Aortic valve stenosis, acquired    a. s/p Porcine AVR 2001;  b. 09/2014 Dobut Echo: mod-sev bioprosthetic AS w/ minimal change in CO and only mild increase in gradients w/ dobutamine.   Marland Kitchen CAD (coronary artery disease)    a. s/p CABG with LIMA-LAD in 2001 with AVR. b. BMS to LCx 8/08. c. Abnl nuc/dec EF 05/2014 - s/p cath with patent LIMA to LAD, patent dominant right, mild LCx disease, patent stent. Med rx.  . Chronic combined systolic (congestive) and diastolic (congestive) heart failure (HCC)    a. EF 50-55% in 02/2013;  b. 05/2014 Echo: EF 20-25%, Gr1 DD.  Marland Kitchen Diverticulitis   . ED (erectile dysfunction)   . Essential hypertension   . GERD (gastroesophageal reflux disease)   . Hyperlipidemia   . MALT lymphoma ([place])   . Nonischemic cardiomyopathy ([place])    a. 05/2014 Echo: EF 20-25%-->f/u cath w/ nonobs dzs.  . Osteoarthritis   . Osteoporosis   . Parkinson's disease   . Renal artery stenosis (HCC)    a. 90% RRA stenosis previously. b. Duplex 09/2014: R renal artery 60-99%, L renal artery 1-59%.  . Symptomatic bradycardia    a. 01/2012 s/p MDT dual chamber PPM b. upgraded to STJ CRTP 2017  . TIA (transient ischemic attack) 06/08   a. 09/2006  Patient Active Problem List   Diagnosis Date Noted  . Mood disorder ([place]) 10/26/2016  . Atherosclerotic heart disease of native coronary artery with angina pectoris ([place])   . Cardiomyopathy, ischemic   . Aortic atherosclerosis ([place]) 10/24/2015  . Chronic combined systolic and diastolic heart failure ([place])   . Hyperlipidemia   . Essential hypertension   . Ischemic heart disease 06/20/2014  . Complete heart block ([place])   . Advanced directives, counseling/discussion 04/23/2014  . Nonsustained ventricular tachycardia ([place]) 06/24/2013  . Routine general medical examination at a health care facility 04/18/2013  . Vitamin B 12 deficiency 09/04/2012  . Constipation - functional 03/29/2012  . S/P  cardiac pacemaker procedure, Medtronic Adapta L  ADDRL1, 01/21/12 01/22/2012  . Renal artery stenosis ([place]) 01/19/2012  . Heart block AV second degree -- 2:1 01/19/2012  . Parkinson disease ([place]) 10/07/2006  . ALLERGIC RHINITIS 10/07/2006  . GERD 10/07/2006  . DIVERTICULOSIS, COLON 10/07/2006  . OSTEOARTHRITIS 10/07/2006  . OSTEOPOROSIS 10/07/2006  . Aortic valve stenosis, acquired -- s/p AVR with Porcie Bioprosthetic AVR; By Echo in 05/2009 moderate stenosis,AVA 0.98cm. 09/19/1999    Past Surgical History:  Procedure Laterality Date  . AORTIC VALVE REPLACEMENT  2001   Porcine valve  . CARDIAC CATHETERIZATION  06/20/2014   Procedure: CORONARY/GRAFT ANGIOGRAPHY;  Surgeon: Lorretta Harp, MD;  Location: [facility] CATH LAB;  Service: Cardiovascular;;  . CATARACT EXTRACTION  06/09   left  . CORONARY ARTERY BYPASS GRAFT  2001   ( van trigt ) aortic valve replacement 2001; LIMA-LAD.  Marland Kitchen CORONARY STENT PLACEMENT  11/2007   8/09  Left Circumflex Stent by Dr Toy Care started and aggrenox stopped  . EP IMPLANTABLE DEVICE N/A 10/30/2015   Procedure: BiV Pacemaker Upgrade;  Surgeon: Thompson Grayer, MD;  Location: [facility] CV LAB;  Service: Cardiovascular;  Laterality: N/A;  . ESOPHAGOGASTRODUODENOSCOPY     multiple, Colon/ EGD benign 11/2004  . INGUINAL HERNIA REPAIR     LIH 1997Llap. bilateral hernias 1998  . LAPAROSCOPIC CHOLECYSTECTOMY  07/11   Dr.Rosenbower  . NM MYOCAR PERF WALL MOTION  11/22/2007   mild septal ischemia,EF 66%  . PERMANENT PACEMAKER INSERTION N/A 01/21/2012   Medtronic Adapta L implanted for complete heart block  . PILONIDAL CYST / SINUS EXCISION  1953  . SQUAMOUS CELL CARCINOMA EXCISION  3/13   back  . TOTAL HIP ARTHROPLASTY Left 01/06/2016   Procedure: TOTAL HIP ARTHROPLASTY ANTERIOR APPROACH;  Surgeon: Hessie Knows, MD;  Location: [facility] ORS;  Service: Orthopedics;  Laterality: Left;       Home Medications    Prior to Admission medications   Medication Sig Start  Date End Date Taking? Authorizing Provider  acetaminophen (TYLENOL) 500 MG tablet Take 500 mg by mouth every 6 (six) hours as needed for mild pain.   Yes [provider]  aspirin 81 MG tablet Take 81 mg by mouth daily.   Yes [provider]  bisoprolol-hydrochlorothiazide (ZIAC) 5-6.25 MG tablet Take 1 tablet by mouth daily. 01/03/17  Yes Lorretta Harp, MD  carbidopa-levodopa (SINEMET CR) 50-200 MG tablet TAKE 1 TABLET 5 TIMES DAILY (8am/11am/2pm/6pm/10pm) 11/04/16  Yes Tat, Eustace Quail, DO  carbidopa-levodopa (SINEMET IR) 25-100 MG tablet TAKE 1 TABLET 3 TIMES A DAY (8am/2pm/10pm) 11/04/16  Yes Tat, Eustace Quail, DO  furosemide (LASIX) 80 MG tablet Take 80 mg by mouth daily.   Yes [provider]  pantoprazole (PROTONIX) 40 MG tablet Take 40 mg by mouth daily. 03/04/17  Yes [provider]  Polyethyl Glycol-Propyl Glycol (SYSTANE ULTRA OP) Place 1 drop into both eyes daily.   Yes [provider]  polyethylene glycol (MIRALAX / GLYCOLAX) packet Take 17 g by mouth daily.   Yes [provider]  potassium chloride (K-DUR) 10 MEQ tablet Take 20 mEq daily by mouth.    Yes [provider]  QUEtiapine (SEROQUEL) 25 MG tablet 2 in the AM, 1 at night 03/07/17  Yes Tat, Eustace Quail, DO  vitamin B-12 (CYANOCOBALAMIN) 1000 MCG tablet Take 1,000 mcg by mouth daily.   Yes [provider]    Family History Family History  Problem Relation Age of Onset  . Asthma Father   . Heart disease Brother     Social History Social History   Tobacco Use  . Smoking status: Former Smoker    Packs/day: 1.00    Years: 5.00    Pack years: 5.00    Types: Cigarettes    Last attempt to quit: 06/06/1963    Years since quitting: 53.9  . Smokeless tobacco: Never Used  Substance Use Topics  . Alcohol use: No    Comment: very rare  . Drug use: No     Allergies   Plavix [clopidogrel]; Pravastatin; Zocor [simvastatin]; and Penicillins   Review of  Systems Review of Systems  Constitutional: Negative for activity change, chills and fever.  HENT: Negative for congestion.   Eyes: Negative for visual disturbance.  Respiratory: Negative for shortness of breath.   Cardiovascular: Negative for chest pain.  Gastrointestinal: Negative for abdominal pain, diarrhea, nausea and vomiting.  Musculoskeletal: Positive for arthralgias, back pain and gait problem.  Skin: Negative for rash.  Allergic/Immunologic: Negative for immunocompromised state.  Neurological: Positive for headaches. Negative for weakness.  Psychiatric/Behavioral: Negative for confusion.     Physical Exam Updated Vital Signs BP (!) 136/50 (BP Location: Left Arm)   Pulse 67   Temp 98 F (36.7 C) (Oral)   Resp 18   SpO2 92%   Physical Exam  Constitutional: He appears well-developed.  HENT:  Head: Normocephalic.  Laceration noted to the superior scalp. No active bleeding. Sutures are in place.   Eyes: Conjunctivae and EOM are normal. Pupils are equal, round, and reactive to light.  Neck: Neck supple.  Cardiovascular: Normal rate, regular rhythm, normal heart sounds and intact distal pulses. Exam reveals no gallop and no friction rub.  No murmur heard. Pulmonary/Chest: Effort normal and breath sounds normal. No stridor. No respiratory distress. He has no wheezes. He has no rales. He exhibits no tenderness.  Abdominal: Soft. Bowel sounds are normal. He exhibits no distension and no mass. There is no tenderness. There is no rebound and no guarding. No hernia.  Musculoskeletal: He exhibits tenderness. He exhibits no edema or deformity.  No tenderness to palpation over the left hip, knee, or ankle.  5 out of 5 strength with dorsiflexion plantarflexion of the bilateral lower extremities.  DP and PT pulses are 2+ and symmetric.  Sensation is intact throughout.  TTP with grimacing over the mid-left femur. Attempted to ambulate the patient with a walker with myself and a NT. The  patient could take approximately 2-3 steps before his gait became irregular and his foot dragged while walking, causing him to lose his balance. This repeated every 2-3 steps and required assistance from two people to keep the patient from falling.  Neurological: He is alert.  Skin: Skin is warm and dry.  Psychiatric: His behavior is normal.  Nursing note and  vitals reviewed.    ED Treatments / Results  Labs (all labs ordered are listed, but only abnormal results are displayed) Labs Reviewed  CBC WITH DIFFERENTIAL/PLATELET  CK  MAGNESIUM    EKG  EKG Interpretation None       Radiology Dg Femur Min 2 Views Left  Result Date: 04/27/2017 CLINICAL DATA:  Left upper leg pain after fall. EXAM: LEFT FEMUR 2 VIEWS COMPARISON:  Left hip x-rays dated January 06, 2016. FINDINGS: Prior left hip hemiarthroplasty. No acute fracture or malalignment. Thin, linear lucency along the anterior margin of the proximal femur likely represents a nutrient foramen. No knee joint effusion. IMPRESSION: Prior left hip hemiarthroplasty.  No fracture or dislocation. Electronically Signed   By: Titus Dubin M.D.   On: 04/27/2017 17:09    Procedures Procedures (including critical care time)  Medications Ordered in ED Medications - No data to display   Initial Impression / Assessment and Plan / ED Course  I have reviewed the triage vital signs and the nursing notes.  Pertinent labs & imaging results that were available during my care of the patient were reviewed by me and considered in my medical decision making (see chart for details).     84 year old male with a history of Parkinson's disease presenting after a fall from standing this morning.  No anticoagulation.  The patient was seen earlier today at [facility] and had a negative head CT and x-ray of the bilateral hips.  Scalp laceration was repaired during that visit.  He returns with complaints of left femur pain.  His wife  reports that he has been unable to stand or ambulate since the fall 5 AM.  He attempted to get around by scooting on the floor on his bottom without success. X-ray of the left femur is negative.  The patient attempted ambulation with a walker with the assistance of myself and the nurse tech.  The patient could ambulate approximately 2-3 steps for his gait became a regular and his feet became entangled, causing him to lose his balance.  He required the assistance of both myself and the nurse tech to keep from falling.  The wife states that she is unable to care for him in this condition. Discussed the patient with Roderic Palau from social work who states that he would be unable to obtain placement in a SNF or AL from the ED. Given that the patient lives with his elderly wife, and is unable to ambulate independently with a walker, the hospitalist team was contacted for admission.  Spoke with Dr. Aggie Moats who will accept the patient for admission. The patient appears reasonably stabilized for admission considering the current resources, flow, and capabilities available in the ED at this time, and I doubt any other [name] requiring further screening and/or treatment in the ED prior to admission.  Final Clinical Impressions(s) / ED Diagnoses   Final diagnoses:  Fall from standing, subsequent encounter  Unable to ambulate    ED Discharge Orders    None       [place], [place], PA-C 04/27/17 Maryclare Bean, MD 04/27/17 2012

## 2017-04-27 NOTE — Clinical Social Work Note (Addendum)
Clinical Social Work Assessment  Patient Details  Name: Ian [name] MRN: [id number] Date of Birth: 04-03-1929  Date of referral:  04/27/17               Reason for consult:  Facility Placement                Permission sought to share information with:  Facility Art therapist granted to share information::  Yes, Verbal Permission Granted  Name::        Agency::     Relationship::     Contact Information:     Housing/Transportation Living arrangements for the past 2 months:  [place] of Information:  Spouse, Patient Patient Interpreter Needed:  None Criminal Activity/Legal Involvement Pertinent to Current Situation/Hospitalization:    Significant Relationships:  Spouse Lives with:  Self Do you feel safe going back to the place where you live?  No Need for family participation in patient care:  Yes (Comment)  Care giving concerns:  Pt's wife states she cannot "help him walk". CSW spoke with CM who stated pt is in the ED to seek a FL-2 for SNF placement.    CSW spoke to pt and pt's wife who state they both "already live at [facility]"  As Independent living residents, but are needing a FL-2 in order order for the pt to be placed in the SNF area of [facility].  Per pt's wife, they called the pt's PCP and the PCP's RN told them to come to the ED to seek a FL-2. Per notes, the pt's PCP was informed and stated he needed more information to facilitate an FL-2.  Per notes, it seems as if the PCP is willing to provide this but still lacks the necessary information, such as pt's ambulatory status, current med list, etc.  CSW reviewed chart and pt's PCP is Therapist, music at [facility].  Pt has Medicare which would require a 3-day inpatient stay before Medicare will authorize payment for a SNF stay.  CSW asked pt's wife if she was planning on paying privately and pt's wife stated "I haven't even thought of that one way or another".  CSW  asked if [facility] had discussed this with her and pt's wife stated, no.  Per pt's wife she was not aware the pt's PCP could provide and FL-2 and also stated that [facility] had told her that the pt's PCP "certainly could provide an FL-2".  CSW confirmed this with the pt's wife and told her the notes indicated that the pt's PCP was just lacking the necessary information to complete the FL-2, but that they have the capability to provide it.  CSW met with the EDP who stated pt's CT results had returned negative.  EDP stated she had attempted to walk the pt but the pt had great difficulty with two people assisting.  EDP states she is attempting to have the pt admitted and will update the CSW when the EDP knows the outcome.  Per ED RN notes: "[facility] called to provide the fax number for when [facility] will be available. The fax number is AttnLoree Fee [phone]".   7:20 PM Per chart pt is now being admitted.   Social Worker assessment / plan:  CSW met with pt and pt's wife and confirmed pt's plan to be discharged to [facility] SNF for rehab at discharge.  CSW provided active listening and validated pt's wife's concerns.   CSW was given permission to complete FL-2  and send referrals out to SNF facilities via the hub per pt's request.  Pt has been living independently prior to being admitted to [facility].  Employment status:  Retired Forensic scientist:  Medicare, Futures trader, Other (Comment Required)([employer]) PT Recommendations:  Not assessed at this time Information / Referral to community resources:     Patient/Family's Response to care:  Patient alert and oriented.  Patient and pt's wife agreeable to plan.  Pt's wife supportive and strongly involved in pt.'s care.  Pt.'s wife pleasant and appreciated CSW intervention.     Patient/Family's Understanding of and Emotional Response to Diagnosis, Current Treatment, and Prognosis:  Pt and family understand current prognosis and  treatment  Emotional Assessment Appearance:    Attitude/Demeanor/Rapport:    Affect (typically observed):  Adaptable, Calm, Overwhelmed Orientation:  Oriented to  Time, Oriented to Situation Alcohol / Substance use:    Psych involvement (Current and /or in the community):     Discharge Needs  Concerns to be addressed:  No discharge needs identified Readmission within the last 30 days:  No Current discharge risk:  None Barriers to Discharge:  No Barriers Identified   Claudine Mouton, LCSWA 04/27/2017, 7:27 PM

## 2017-04-27 NOTE — Telephone Encounter (Signed)
We have not discussed him going into facility before and I need a lot more information anyway (and this is not supposed to be a last minute thing!!!) Is he going into SNF or AL? Any incontinence? What is his ambulatory status--still walks? With walker? Assistance? Need to confirm meds There will be a charge if I have to do this--especially with no visit!!

## 2017-04-27 NOTE — ED Triage Notes (Signed)
Per EMS, pt fell this morning at 4AM. Pt was seen at [facility] and had staples placed in scalp. Pt's wife noticed pt has been holding left hip and was unable to sit up on his own. Pain is worse when standing.

## 2017-04-27 NOTE — ED Notes (Signed)
Bed: [facility] Expected date:  Expected time:  Means of arrival:  Comments: 

## 2017-04-27 NOTE — Progress Notes (Addendum)
CSW spoke with CM who stated pt is in the ED to seek a FL-2 for SNF placement.    CSW spoke to pt and pt's wife who state they both "already live at [place]"  As Independent living residents, but are needing a FL-2 in order order for the pt to be placed in the SNF area of [facility].  Per pt's wife, they called the pt's PCP and the PCP's RN told them to come to the ED to seek a FL-2. Per notes, the pt's PCP was informed and stated he needed more information to facilitate an FL-2.  Per notes, it seems as if the PCP is willing to provide this but still lacks the necessary information, such as pt's ambulatory status, current med list, etc.  CSW reviewed chart and pt's PCP is Therapist, music at [facility].  Pt has Medicare which would require a 3-day inpatient stay before Medicare will authorize payment for a SNF stay.  CSW asked pt's wife if she was planning on paying privately and pt's wife stated "I haven't even thought of that one way or another".  CSW asked if [facility] had discussed this with her and pt's wife stated, no.  Per pt's wife she was not aware the pt's PCP could provide and FL-2 and also stated that [facility] had told her that the pt's PCP "certainly could provide an FL-2".  CSW confirmed this with the pt's wife and told her the notes indicated that the pt's PCP was just lacking the necessary information to complete the FL-2, but that they have the capability to provide it.  CSW met with the EDP who stated pt's CT results had returned negative.  EDP stated she had attempted to walk the pt but the pt had great difficulty with two people assisting.  EDP states she is attempting to have the pt admitted and will update the CSW when the EDP knows the outcome.  Per ED RN notes: "[facility] called to provide the fax number for when [facility] will be available. The fax number is AttnLoree Fee [phone]".   7:20 PM Per chart pt is now being admitted.  CSW will continue to follow  for D/C needs.  Alphonse Guild. Nathanuel Cabreja, LCSW, LCAS, CSI Clinical Social Worker Ph: [phone]

## 2017-04-28 DIAGNOSIS — R627 Adult failure to thrive: Secondary | ICD-10-CM | POA: Diagnosis present

## 2017-04-28 DIAGNOSIS — G20 Parkinson's disease: Secondary | ICD-10-CM

## 2017-04-28 DIAGNOSIS — F0280 Dementia in other diseases classified elsewhere without behavioral disturbance: Secondary | ICD-10-CM

## 2017-04-28 DIAGNOSIS — W1830XA Fall on same level, unspecified, initial encounter: Secondary | ICD-10-CM | POA: Diagnosis present

## 2017-04-28 DIAGNOSIS — R262 Difficulty in walking, not elsewhere classified: Secondary | ICD-10-CM | POA: Diagnosis not present

## 2017-04-28 DIAGNOSIS — Y92009 Unspecified place in unspecified non-institutional (private) residence as the place of occurrence of the external cause: Secondary | ICD-10-CM | POA: Diagnosis not present

## 2017-04-28 DIAGNOSIS — R4182 Altered mental status, unspecified: Secondary | ICD-10-CM | POA: Diagnosis not present

## 2017-04-28 DIAGNOSIS — Z955 Presence of coronary angioplasty implant and graft: Secondary | ICD-10-CM | POA: Diagnosis not present

## 2017-04-28 DIAGNOSIS — M810 Age-related osteoporosis without current pathological fracture: Secondary | ICD-10-CM | POA: Diagnosis present

## 2017-04-28 DIAGNOSIS — I5042 Chronic combined systolic (congestive) and diastolic (congestive) heart failure: Secondary | ICD-10-CM

## 2017-04-28 DIAGNOSIS — Z7982 Long term (current) use of aspirin: Secondary | ICD-10-CM | POA: Diagnosis not present

## 2017-04-28 DIAGNOSIS — Z66 Do not resuscitate: Secondary | ICD-10-CM | POA: Diagnosis present

## 2017-04-28 DIAGNOSIS — K219 Gastro-esophageal reflux disease without esophagitis: Secondary | ICD-10-CM | POA: Diagnosis present

## 2017-04-28 DIAGNOSIS — I2510 Atherosclerotic heart disease of native coronary artery without angina pectoris: Secondary | ICD-10-CM | POA: Diagnosis present

## 2017-04-28 DIAGNOSIS — Z79899 Other long term (current) drug therapy: Secondary | ICD-10-CM | POA: Diagnosis not present

## 2017-04-28 DIAGNOSIS — Z950 Presence of cardiac pacemaker: Secondary | ICD-10-CM | POA: Diagnosis not present

## 2017-04-28 DIAGNOSIS — E860 Dehydration: Secondary | ICD-10-CM | POA: Diagnosis present

## 2017-04-28 DIAGNOSIS — I701 Atherosclerosis of renal artery: Secondary | ICD-10-CM | POA: Diagnosis present

## 2017-04-28 DIAGNOSIS — I350 Nonrheumatic aortic (valve) stenosis: Secondary | ICD-10-CM | POA: Diagnosis present

## 2017-04-28 DIAGNOSIS — Z87891 Personal history of nicotine dependence: Secondary | ICD-10-CM | POA: Diagnosis not present

## 2017-04-28 DIAGNOSIS — F062 Psychotic disorder with delusions due to known physiological condition: Secondary | ICD-10-CM | POA: Diagnosis present

## 2017-04-28 DIAGNOSIS — Z96642 Presence of left artificial hip joint: Secondary | ICD-10-CM | POA: Diagnosis present

## 2017-04-28 DIAGNOSIS — I110 Hypertensive heart disease with heart failure: Secondary | ICD-10-CM | POA: Diagnosis present

## 2017-04-28 DIAGNOSIS — S0101XA Laceration without foreign body of scalp, initial encounter: Secondary | ICD-10-CM | POA: Diagnosis present

## 2017-04-28 DIAGNOSIS — Z951 Presence of aortocoronary bypass graft: Secondary | ICD-10-CM | POA: Diagnosis not present

## 2017-04-28 DIAGNOSIS — Z953 Presence of xenogenic heart valve: Secondary | ICD-10-CM | POA: Diagnosis not present

## 2017-04-28 DIAGNOSIS — Z8673 Personal history of transient ischemic attack (TIA), and cerebral infarction without residual deficits: Secondary | ICD-10-CM | POA: Diagnosis not present

## 2017-04-28 DIAGNOSIS — M1990 Unspecified osteoarthritis, unspecified site: Secondary | ICD-10-CM | POA: Diagnosis present

## 2017-04-28 LAB — CBC
HCT: 34.1 % — ABNORMAL LOW (ref 39.0–52.0)
Hemoglobin: 11.3 g/dL — ABNORMAL LOW (ref 13.0–17.0)
MCH: 31.2 pg (ref 26.0–34.0)
MCHC: 33.1 g/dL (ref 30.0–36.0)
MCV: 94.2 fL (ref 78.0–100.0)
PLATELETS: 249 10*3/uL (ref 150–400)
RBC: 3.62 MIL/uL — AB (ref 4.22–5.81)
RDW: 13.3 % (ref 11.5–15.5)
WBC: 9.5 10*3/uL (ref 4.0–10.5)

## 2017-04-28 LAB — BASIC METABOLIC PANEL
Anion gap: 6 (ref 5–15)
BUN: 14 mg/dL (ref 6–20)
CALCIUM: 8.9 mg/dL (ref 8.9–10.3)
CO2: 28 mmol/L (ref 22–32)
CREATININE: 0.72 mg/dL (ref 0.61–1.24)
Chloride: 104 mmol/L (ref 101–111)
Glucose, Bld: 119 mg/dL — ABNORMAL HIGH (ref 65–99)
Potassium: 3.9 mmol/L (ref 3.5–5.1)
SODIUM: 138 mmol/L (ref 135–145)

## 2017-04-28 LAB — URINALYSIS, ROUTINE W REFLEX MICROSCOPIC
Bilirubin Urine: NEGATIVE
Glucose, UA: NEGATIVE mg/dL
Hgb urine dipstick: NEGATIVE
Ketones, ur: NEGATIVE mg/dL
LEUKOCYTES UA: NEGATIVE
Nitrite: NEGATIVE
PROTEIN: NEGATIVE mg/dL
Specific Gravity, Urine: 1.006 (ref 1.005–1.030)
pH: 6 (ref 5.0–8.0)

## 2017-04-28 MED ORDER — SODIUM CHLORIDE 0.9 % IV SOLN
INTRAVENOUS | Status: AC
  Administered 2017-04-27: 23:00:00 via INTRAVENOUS

## 2017-04-28 MED ORDER — BISOPROLOL FUMARATE 5 MG PO TABS
5.0000 mg | ORAL_TABLET | Freq: Every day | ORAL | Status: DC
  Administered 2017-04-28: 5 mg via ORAL
  Filled 2017-04-28: qty 1

## 2017-04-28 MED ORDER — HYDRALAZINE HCL 20 MG/ML IJ SOLN: 10.0000 mg | Freq: Three times a day (TID) | INTRAMUSCULAR | Status: DC | PRN

## 2017-04-28 NOTE — Care Management Obs Status (Signed)
[facility] NOTIFICATION   Patient Details  Name: Ian [name] MRN: [id number] Date of Birth: [birth year]   Medicare Observation Status Notification Given:  Yes    Leeroy Cha, RN 04/28/2017, 2:15 PM

## 2017-04-28 NOTE — Progress Notes (Addendum)
Spoke with [facility] SN[facility], pt lives in independent living there and family has planned for him to admit to the SNF building. (See previous CSW's assessment for details) Discussed as pt observation status will not be able to utilize Medicare benefit for SNF. PT eval pending- will complete FL2 once care needs known.  Spoke with pt's son to explain that as pt has not had qualifying inpatient stay in hospital in past 30 days, Medicare will not cover cost of SNF. Son states he understands and will discuss with pt/pt's wife.  Sharren Bridge, MSW, LCSW Clinical Social Work 04/28/2017 [phone]

## 2017-04-28 NOTE — Progress Notes (Signed)
PROGRESS NOTE  Ian [name] OHY:[id number] DOB: Apr 13, 1929 DOA: 04/27/2017 PCP: Venia Carbon, MD  HPI/Recap of past 24 hours:  Frail, confused elderly , appear weak,  Family at bedside, reports progressive weakness and confusion due to parkinson progression  Assessment/Plan: Active Problems:   Unable to walk   FTT (failure to thrive) in adult   Unable to walk/FTT/ progressive weakness -he was seen in the ER in [place] on 1/9 after a fall, ct head/left hip x ray no acute findings, basic labs unremarkable, he was discharged home. However, he could not stand up from sitting position, he could not walk and ended up crawling on the floor. EMS called, he is brought to [facility] long ED. Basic labs unremarkable. Ck unremarkable. -ua no infection.  - patient has progressive weakness, FTT, likely due to progression of Parkinson's disease - will need SNF placement.    Parkinson At baseline patient has hallucinations and delusions associated with his Parkinson's Cont sinemet and serroquel  TIA hx Cont asa qd  Combined chf, appear dry and dehydrated, he received gentle hydration in the ED He is continued on home meds lasix, change bisoprolol/hctz to bisoprolol only.  Hypertension Continue bisoprolol and lasix  GERD Cont PPI     Code Status: DNR  Family Communication: patient and family at bedside  Disposition Plan: SNF   Consultants:  Social worker  Procedures:  none  Antibiotics:  none   Objective: BP (!) 137/56 (BP Location: Left Arm)   Pulse 64   Temp (!) 97.5 F (36.4 C) (Oral)   Resp 18   SpO2 97%   Intake/Output Summary (Last 24 hours) at 04/28/2017 1930 Last data filed at 04/28/2017 1252 Gross per 24 hour  Intake 120 ml  Output 400 ml  Net -280 ml   There were no vitals filed for this visit.  Exam: Patient is examined daily including today on 04/28/2017, exams remain the same as of yesterday except that has changed    General:   Frail, confused elderly male  Cardiovascular: RRR  Respiratory: CTABL  Abdomen: Soft/ND/NT, positive BS  Musculoskeletal: No Edema  Neuro: confused, per family , this is close to baseline.  Data Reviewed: Basic Metabolic Panel: Recent Labs  Lab 04/27/17 1940 04/28/17 0355  NA  --  138  K  --  3.9  CL  --  104  CO2  --  28  GLUCOSE  --  119*  BUN  --  14  CREATININE  --  0.72  CALCIUM  --  8.9  MG 2.1  --    Liver Function Tests: No results for input(s): AST, ALT, ALKPHOS, BILITOT, PROT, ALBUMIN in the last 168 hours. No results for input(s): LIPASE, AMYLASE in the last 168 hours. No results for input(s): AMMONIA in the last 168 hours. CBC: Recent Labs  Lab 04/27/17 1940 04/28/17 0355  WBC 11.0* 9.5  NEUTROABS 8.6*  --   HGB 12.1* 11.3*  HCT 35.6* 34.1*  MCV 94.2 94.2  PLT 261 249   Cardiac Enzymes:   Recent Labs  Lab 04/27/17 1940  CKTOTAL 33*   BNP (last 3 results) No results for input(s): BNP in the last 8760 hours.  ProBNP (last 3 results) No results for input(s): PROBNP in the last 8760 hours.  CBG: No results for input(s): GLUCAP in the last 168 hours.  No results found for this or any previous visit (from the past 240 hour(s)).   Studies: No results found.  Scheduled Meds: . aspirin EC  81 mg Oral Daily  . bisoprolol-hydrochlorothiazide  1 tablet Oral Daily  . carbidopa-levodopa  1 tablet Oral 5 times per day  . carbidopa-levodopa  1 tablet Oral Q8H  . furosemide  80 mg Oral Daily  . heparin  5,000 Units Subcutaneous Q8H  . pantoprazole  40 mg Oral Daily  . polyethylene glycol  17 g Oral Daily  . polyvinyl alcohol  1 drop Both Eyes Daily  . potassium chloride  20 mEq Oral Daily  . QUEtiapine  50 mg Oral q morning - 10a  . vitamin B-12  1,000 mcg Oral Daily    Continuous Infusions:   Time spent: 6mins I have personally reviewed and interpreted on  04/28/2017 daily labs, imagings as discussed above under date review session and  assessment and plans.  I reviewed all nursing notes, vitals, pertinent old records  I have discussed plan of care as described above with RN , patient and family on 04/28/2017   Florencia Reasons MD, PhD  [facility] Pager [phone]. If 7PM-7AM, please contact night-coverage at [email or web address], password [facility] 04/28/2017, 7:30 PM  LOS: 0 days

## 2017-04-28 NOTE — Telephone Encounter (Signed)
Spoke with patient's wife who informed me that he has been admitted to [facility].  I have instructed patient's wife to discuss transfer need to [facility] SNF for patient and need for them to do the FL2.  [name] (wife) verbalizes understanding and will discuss further with the hospital.  Also, I called [facility] ([facility] - admissions coordinator) to inform her that we are deferring to hospital to complete the FL2.  [facility] verbalizes understanding and states that she is in direct contact with the hospitalist at [facility] regarding patient's case.

## 2017-04-28 NOTE — Evaluation (Signed)
Physical Therapy Evaluation Patient Details Name: Ian [name] MRN: [id number] DOB: Aug 14, 1928 Today's Date: 04/28/2017   History of Present Illness  84 y.o. male with past medical history significant for L THA 2017, Parkinson's, heart failure and high blood pressure presents with weakness.  Patient recently seen in the emergency room after a fall.  This was at [facility].  Patient was discharged home.  At the hospital patient had a workup including imaging.  He was cleared and sent home.  Later at home patient could not stand.  Ended up crawling on the floor.  Rescue squad was called.  Clinical Impression  Pt admitted with above diagnosis. Pt currently with functional limitations due to the deficits listed below (see PT Problem List). Mod assist bed mobility, Min assist to ambulate 30' with RW. Pt has had 8-9 falls in past 1 year and is at high risk for further falls 2* poor standing balance. He will need 24* assistance at SNF level of care upon acute DC.   Pt will benefit from skilled PT to increase their independence and safety with mobility to allow discharge to the venue listed below.       Follow Up Recommendations SNF    Equipment Recommendations  None recommended by PT    Recommendations for Other Services       Precautions / Restrictions Precautions Precautions: Fall Precaution Comments: 8-9 falls in past year Restrictions Weight Bearing Restrictions: No      Mobility  Bed Mobility Overal bed mobility: Needs Assistance Bed Mobility: Supine to Sit     Supine to sit: Mod assist     General bed mobility comments: assist to raise trunk and pivot hips to EOB  Transfers Overall transfer level: Needs assistance Equipment used: Rolling walker (2 wheeled) Transfers: Sit to/from Stand Sit to Stand: Min assist;From elevated surface         General transfer comment: assist to rise/steady, posterior lean initially  Ambulation/Gait Ambulation/Gait  assistance: Min assist Ambulation Distance (Feet): 30 Feet Assistive device: Rolling walker (2 wheeled) Gait Pattern/deviations: Step-to pattern;Narrow base of support;Shuffle   Gait velocity interpretation: Below normal speed for age/gender General Gait Details: min A for balance, VCs for positioning in RW and to increase step length  Stairs            Wheelchair Mobility    Modified Rankin (Stroke Patients Only)       Balance Overall balance assessment: Needs assistance;History of Falls Sitting-balance support: Feet supported;Single extremity supported Sitting balance-Leahy Scale: Fair       Standing balance-Leahy Scale: Poor Standing balance comment: posterior lean initially in standing with RW                             Pertinent Vitals/Pain Pain Assessment: Faces Faces Pain Scale: Hurts little more Pain Location: L hip with abduction, small bruise noted L lateral hip, imaging negative, h/o L THA -anterior approach 2017 Pain Descriptors / Indicators: Guarding Pain Intervention(s): Limited activity within patient's tolerance;Monitored during session;Repositioned    Home Living Family/patient expects to be discharged to:: Skilled nursing facility                      Prior Function Level of Independence: Needs assistance   Gait / Transfers Assistance Needed: ambulated with cane to dining room, 8-9 falls in past 1 year     Comments: was in ILF with wife, walked with  cane, assist for ADLs     Hand Dominance        Extremity/Trunk Assessment   Upper Extremity Assessment Upper Extremity Assessment: Overall WFL for tasks assessed    Lower Extremity Assessment Lower Extremity Assessment: Overall WFL for tasks assessed(R knee ext 5/5, L knee ext 4/5)    Cervical / Trunk Assessment Cervical / Trunk Assessment: Kyphotic  Communication   Communication: No difficulties(cannot communicate needs nor answer questions about prior level of  function 2* dementia)  Cognition Arousal/Alertness: Awake/alert Behavior During Therapy: WFL for tasks assessed/performed Overall Cognitive Status: History of cognitive impairments - at baseline                                 General Comments: follows commands inconsistently; cannot provide hx nor answer questions about pain; son reports this is baseline      General Comments      Exercises     Assessment/Plan    PT Assessment Patient needs continued PT services  PT Problem List Decreased balance;Decreased mobility;Decreased activity tolerance;Pain       PT Treatment Interventions Gait training;DME instruction;Functional mobility training;Therapeutic activities;Therapeutic exercise;Balance training    PT Goals (Current goals can be found in the Care Plan section)  Acute Rehab PT Goals Patient Stated Goal: DC to SNF, likes to watch college basketball PT Goal Formulation: With family Time For Goal Achievement: 05/05/17 Potential to Achieve Goals: Fair    Frequency Min 3X/week   Barriers to discharge        Co-evaluation               AM-PAC PT "6 Clicks" Daily Activity  Outcome Measure Difficulty turning over in bed (including adjusting bedclothes, sheets and blankets)?: A Lot Difficulty moving from lying on back to sitting on the side of the bed? : Unable Difficulty sitting down on and standing up from a chair with arms (e.g., wheelchair, bedside commode, etc,.)?: Unable Help needed moving to and from a bed to chair (including a wheelchair)?: A Lot Help needed walking in hospital room?: A Lot Help needed climbing 3-5 steps with a railing? : Total 6 Click Score: 9    End of Session Equipment Utilized During Treatment: Gait belt Activity Tolerance: Patient tolerated treatment well Patient left: in chair;with call bell/phone within reach;with chair alarm set Nurse Communication: Mobility status PT Visit Diagnosis: Unsteadiness on feet  (R26.81);Repeated falls (R29.6);Difficulty in walking, not elsewhere classified (R26.2);Pain Pain - Right/Left: Left Pain - part of body: Hip    Time: 1330-1404 PT Time Calculation (min) (ACUTE ONLY): 34 min   Charges:   PT Evaluation $PT Eval Moderate Complexity: 1 Mod PT Treatments $Gait Training: 8-22 mins   PT G Codes:         Philomena Doheny 04/28/2017, 2:16 PM [phone]

## 2017-04-28 NOTE — NC FL2 (Signed)
[place] LEVEL OF CARE SCREENING TOOL     IDENTIFICATION  Patient Name: Ian [name] Birthdate: 09-18-1928 Sex: male Admission Date (Current Location): 04/27/2017  [facility] and [place] Number:  Herbalist and Address:  [facility],  [place] [address] [place]      Provider Number: [id number]  Attending Physician Name and Address:  Florencia Reasons, MD  Relative Name and Phone Number:       Current Level of Care: SNF Recommended Level of Care: [place] Prior Approval Number:    Date Approved/Denied:   PASRR Number: [id number]  Discharge Plan: SNF    Current Diagnoses: Patient Active Problem List   Diagnosis Date Noted  . Unable to walk 04/27/2017  . Mood disorder ([place]) 10/26/2016  . Atherosclerotic heart disease of native coronary artery with angina pectoris ([place])   . Cardiomyopathy, ischemic   . Aortic atherosclerosis ([place]) 10/24/2015  . Chronic combined systolic and diastolic heart failure ([place])   . Hyperlipidemia   . Essential hypertension   . Ischemic heart disease 06/20/2014  . Complete heart block ([place])   . Advanced directives, counseling/discussion 04/23/2014  . Nonsustained ventricular tachycardia ([place]) 06/24/2013  . Routine general medical examination at a health care facility 04/18/2013  . Vitamin B 12 deficiency 09/04/2012  . Constipation - functional 03/29/2012  . S/P cardiac pacemaker procedure, Medtronic Adapta L  ADDRL1, 01/21/12 01/22/2012  . Renal artery stenosis ([place]) 01/19/2012  . Heart block AV second degree -- 2:1 01/19/2012  . Parkinson disease ([place]) 10/07/2006  . ALLERGIC RHINITIS 10/07/2006  . GERD 10/07/2006  . DIVERTICULOSIS, COLON 10/07/2006  . OSTEOARTHRITIS 10/07/2006  . OSTEOPOROSIS 10/07/2006  . Aortic valve stenosis, acquired -- s/p AVR with Porcie Bioprosthetic AVR; By Echo in 05/2009 moderate stenosis,AVA 0.98cm. 09/19/1999    Orientation RESPIRATION BLADDER Height &  Weight     Self, Time, Situation, Place  Normal Continent Weight:   Height:     BEHAVIORAL SYMPTOMS/MOOD NEUROLOGICAL BOWEL NUTRITION STATUS      Continent Diet(heart healthy)  AMBULATORY STATUS COMMUNICATION OF NEEDS Skin   Extensive Assist Verbally Normal                       Personal Care Assistance Level of Assistance  Bathing, Feeding, Dressing Bathing Assistance: Limited assistance Feeding assistance: Independent Dressing Assistance: Limited assistance     Functional Limitations Info  Hearing, Speech, Sight Sight Info: Adequate Hearing Info: Adequate Speech Info: Adequate    SPECIAL CARE FACTORS FREQUENCY  PT (By licensed PT), OT (By licensed OT)     PT Frequency: 5x OT Frequency: 5x            Contractures Contractures Info: Not present    Additional Factors Info  Code Status, Allergies Code Status Info: DNR Allergies Info: Plavix Clopidogrel, Pravastatin, Zocor Simvastatin, Penicillins           Current Medications (04/28/2017):  This is the current hospital active medication list Current Facility-Administered Medications  Medication Dose Route Frequency Provider Last Rate Last Dose  . acetaminophen (TYLENOL) tablet 500 mg  500 mg Oral Q6H PRN Elwin Mocha, MD   500 mg at 04/28/17 4782  . aspirin EC tablet 81 mg  81 mg Oral Daily Elwin Mocha, MD   81 mg at 04/28/17 1045  . bisoprolol-hydrochlorothiazide (ZIAC) 5-6.25 MG per tablet 1 tablet  1 tablet Oral Daily Elwin Mocha, MD   1 tablet  at 04/28/17 1045  . carbidopa-levodopa (SINEMET CR) 50-200 MG per tablet controlled release 1 tablet  1 tablet Oral 5 times per day Elwin Mocha, MD   1 tablet at 04/28/17 1648  . carbidopa-levodopa (SINEMET IR) 25-100 MG per tablet immediate release 1 tablet  1 tablet Oral Q8H Elwin Mocha, MD   1 tablet at 04/28/17 1647  . furosemide (LASIX) tablet 80 mg  80 mg Oral Daily Elwin Mocha, MD   80 mg at 04/28/17 1045  . heparin injection 5,000  Units  5,000 Units Subcutaneous Q8H Elwin Mocha, MD   5,000 Units at 04/28/17 1647  . hydrALAZINE (APRESOLINE) injection 10 mg  10 mg Intravenous Q8H PRN Elwin Mocha, MD      . ondansetron [facility]) tablet 4 mg  4 mg Oral Q6H PRN Elwin Mocha, MD       Or  . ondansetron [facility]) injection 4 mg  4 mg Intravenous Q6H PRN Elwin Mocha, MD      . pantoprazole (PROTONIX) EC tablet 40 mg  40 mg Oral Daily Elwin Mocha, MD   40 mg at 04/28/17 1045  . polyethylene glycol (MIRALAX / GLYCOLAX) packet 17 g  17 g Oral Daily Elwin Mocha, MD   17 g at 04/28/17 1045  . polyvinyl alcohol (LIQUIFILM TEARS) 1.4 % ophthalmic solution 1 drop  1 drop Both Eyes Daily Elwin Mocha, MD      . potassium chloride (K-DUR,KLOR-CON) CR tablet 20 mEq  20 mEq Oral Daily Elwin Mocha, MD   20 mEq at 04/28/17 1045  . QUEtiapine (SEROQUEL) tablet 50 mg  50 mg Oral q morning - 10a Elwin Mocha, MD   50 mg at 04/28/17 1045  . vitamin B-12 (CYANOCOBALAMIN) tablet 1,000 mcg  1,000 mcg Oral Daily Elwin Mocha, MD   1,000 mcg at 04/28/17 1045     Discharge Medications: Please see discharge summary for a list of discharge medications.  Relevant Imaging Results:  Relevant Lab Results:   Additional Information SSN [id number]  Nila Nephew, LCSW

## 2017-04-28 NOTE — Telephone Encounter (Signed)
Okay I saw that he was admitted and they will take care of the FL-2 when he is ready for discharge

## 2017-04-29 MED ORDER — BISOPROLOL FUMARATE 5 MG PO TABS: 5.0000 mg | ORAL_TABLET | Freq: Every day | ORAL | 0 refills | Status: AC

## 2017-04-29 MED ORDER — CLONAZEPAM 0.5 MG PO TABS
0.2500 mg | ORAL_TABLET | Freq: Once | ORAL | Status: AC
  Administered 2017-04-29: 0.25 mg via ORAL

## 2017-04-29 NOTE — Clinical Social Work Placement (Signed)
Pt discharged and will admit to [facility] SNF room 7013- report [phone].  Pt will transport via [place] at bedside aware and agreeable to plan See below for placement details   CLINICAL SOCIAL WORK PLACEMENT  NOTE  Date:  04/29/2017  Patient Details  Name: Ian [name] MRN: [id number] Date of Birth: 1928-06-17  Clinical Social Work is seeking post-discharge placement for this patient at the [place] level of care (*CSW will initial, date and re-position this form in  chart as items are completed):  Yes   Patient/family provided with [place] Work Department's list of facilities offering this level of care within the geographic area requested by the patient (or if unable, by the patient's family).  Yes   Patient/family informed of their freedom to choose among providers that offer the needed level of care, that participate in Medicare, Medicaid or managed care program needed by the patient, have an available bed and are willing to accept the patient.  Yes   Patient/family informed of [facility]'s ownership interest in [facility] and [facility], as well as of the fact that they are under no obligation to receive care at these facilities.  PASRR submitted to EDS on       PASRR number received on       Existing PASRR number confirmed on 04/28/17     FL2 transmitted to all facilities in geographic area requested by pt/family on 04/28/17     FL2 transmitted to all facilities within larger geographic area on       Patient informed that his/her managed care company has contracts with or will negotiate with certain facilities, including the following:        Yes   Patient/family informed of bed offers received.  Patient chooses bed at [facility] recommends and patient chooses bed at [facility]    Patient to be transferred to [facility] [place] on 04/29/17.  Patient to be transferred  to facility by [facility]     Patient family notified on 04/29/17 of transfer.  Name of family member notified:  wife and      PHYSICIAN       Additional Comment:    _______________________________________________ Nila Nephew, LCSW 04/29/2017, 10:19 AM

## 2017-04-29 NOTE — Discharge Summary (Signed)
Discharge Summary  Ian [name] UUV:[id number] DOB: 05-31-1928  PCP: Venia Carbon, MD  Admit date: 04/27/2017 Discharge date: 04/29/2017  Time spent: <93mins  Recommendations for Outpatient Follow-up:  1. F/u with SNF MD  for hospital discharge follow up, repeat cbc/bmp at follow up 2. F/u with neurology Dr Wells Guiles Tat  for parkinson's disease 3. F/u with cardiology ([facility]) for chf  Discharge Diagnoses:  Active Hospital Problems   Diagnosis Date Noted  . FTT (failure to thrive) in adult 04/28/2017  . Unable to walk 04/27/2017    Resolved Hospital Problems  No resolved problems to display.    Discharge Condition: stable  Diet recommendation: heart healthy  There were no vitals filed for this visit.  History of present illness:  PCP: Venia Carbon, MD   Chief Complaint: "He could not get up." - wife  HPI: Ian [name] is a 84 y.o. male with past medical history significant for Parkinson's, heart failure and high blood pressure presents with weakness.  Patient recently seen in the emergency room after a fall.  This was at [facility].  Patient was discharged home.  At the hospital patient had a workup including imaging.  He was cleared and sent home.  Later at home patient could not stand.  Ended up crawling on the floor.  Rescue squad was called.  Patient brought back to the hospital.  At this time to [facility].  ED course: Patient recommended for admission.    Hospital Course:  Active Problems:   Unable to walk   FTT (failure to thrive) in adult  Unable to walk/FTT/ progressive weakness -he was seen in the ER in [place] on 1/9 after a fall, ct head/left hip x ray no acute findings, basic labs unremarkable, he was discharged home. However, he could not stand up from sitting position, he could not walk and ended up crawling on the floor. EMS called, he is brought to [facility] ED. Basic labs unremarkable. Ck  unremarkable. -ua no infection.  - patient has progressive weakness, FTT, likely due to progression of Parkinson's disease - SNF placement.    H/o Parkinson At baseline patient has hallucinations and delusions associated with his Parkinson's Cont sinemet and serroquel F/u with neurology  H/o TIA Cont asa qd  Combined chf, appear dry and dehydrated, he received gentle hydration in the ED He is continued on home meds lasix, change bisoprolol/hctz to bisoprolol only. Close follow up with cardiology.  Hypertension Continue bisoprolol and lasix  GERD Cont PPI    Code Status: DNR  Family Communication: patient and family at bedside  Disposition Plan: SNF   Consultants:  Social worker  Procedures:  none  Antibiotics:  none   Discharge Exam: BP (!) 151/57 (BP Location: Right Arm)   Pulse (!) 104   Temp 98.3 F (36.8 C) (Oral)   Resp 18   SpO2 95%    General:  Frail, confused elderly male, resting tremor  Cardiovascular: RRR  Respiratory: CTABL  Abdomen: Soft/ND/NT, positive BS  Musculoskeletal: No Edema  Neuro: confused, per family , this is close to baseline.   Discharge Instructions You were cared for by a hospitalist during your hospital stay. If you have any questions about your discharge medications or the care you received while you were in the hospital after you are discharged, you can call the unit and asked to speak with the hospitalist on call if the hospitalist that took care of you  is not available. Once you are discharged, your primary care physician will handle any further medical issues. Please note that NO REFILLS for any discharge medications will be authorized once you are discharged, as it is imperative that you return to your primary care physician (or establish a relationship with a primary care physician if you do not have one) for your aftercare needs so that they can reassess your need for medications and monitor  your lab values.  Discharge Instructions    Diet - low sodium heart healthy   Complete by:  As directed    Increase activity slowly   Complete by:  As directed      Allergies as of 04/29/2017      Reactions   Plavix [clopidogrel]    Fatigue   Pravastatin    Muscle aches   Zocor [simvastatin]    REACTION: dizziness   Penicillins Rash   Has patient had a PCN reaction causing immediate rash, facial/tongue/throat swelling, SOB or lightheadedness with hypotension: YES Has patient had a PCN reaction causing severe rash involving mucus membranes or skin necrosis: NO Has patient had a PCN reaction that required hospitalization NO Has patient had a PCN reaction occurring within the last 10 years: NO If all of the above answers are "NO", then may proceed with Cephalosporin use.      Medication List    STOP taking these medications   bisoprolol-hydrochlorothiazide 5-6.25 MG tablet Commonly known as:  ZIAC     TAKE these medications   acetaminophen 500 MG tablet Commonly known as:  TYLENOL Take 500 mg by mouth every 6 (six) hours as needed for mild pain.   aspirin 81 MG tablet Take 81 mg by mouth daily.   bisoprolol 5 MG tablet Commonly known as:  ZEBETA Take 1 tablet (5 mg total) by mouth at bedtime.   carbidopa-levodopa 50-200 MG tablet Commonly known as:  SINEMET CR TAKE 1 TABLET 5 TIMES DAILY (8am/11am/2pm/6pm/10pm)   carbidopa-levodopa 25-100 MG tablet Commonly known as:  SINEMET IR TAKE 1 TABLET 3 TIMES A DAY (8am/2pm/10pm)   furosemide 80 MG tablet Commonly known as:  LASIX Take 80 mg by mouth daily.   pantoprazole 40 MG tablet Commonly known as:  PROTONIX Take 40 mg by mouth daily.   polyethylene glycol packet Commonly known as:  MIRALAX / GLYCOLAX Take 17 g by mouth daily.   potassium chloride 10 MEQ tablet Commonly known as:  K-DUR Take 20 mEq daily by mouth.   QUEtiapine 25 MG tablet Commonly known as:  SEROQUEL 2 in the AM, 1 at night   SYSTANE  ULTRA OP Place 1 drop into both eyes daily.   vitamin B-12 1000 MCG tablet Commonly known as:  CYANOCOBALAMIN Take 1,000 mcg by mouth daily.      Allergies  Allergen Reactions  . Plavix [Clopidogrel]     Fatigue   . Pravastatin     Muscle aches  . Zocor [Simvastatin]     REACTION: dizziness  . Penicillins Rash    Has patient had a PCN reaction causing immediate rash, facial/tongue/throat swelling, SOB or lightheadedness with hypotension: YES Has patient had a PCN reaction causing severe rash involving mucus membranes or skin necrosis: NO Has patient had a PCN reaction that required hospitalization NO Has patient had a PCN reaction occurring within the last 10 years: NO If all of the above answers are "NO", then may proceed with Cephalosporin use.   Contact information for after-discharge care  Destination    [facility] SNF/ALF .   Service:  Skilled Nursing Contact information: [address] [place] [phone]               The results of significant diagnostics from this hospitalization (including imaging, microbiology, ancillary and laboratory) are listed below for reference.    Significant Diagnostic Studies: Dg Femur Min 2 Views Left  Result Date: 04/27/2017 CLINICAL DATA:  Left upper leg pain after fall. EXAM: LEFT FEMUR 2 VIEWS COMPARISON:  Left hip x-rays dated January 06, 2016. FINDINGS: Prior left hip hemiarthroplasty. No acute fracture or malalignment. Thin, linear lucency along the anterior margin of the proximal femur likely represents a nutrient foramen. No knee joint effusion. IMPRESSION: Prior left hip hemiarthroplasty.  No fracture or dislocation. Electronically Signed   By: Titus Dubin M.D.   On: 04/27/2017 17:09    Microbiology: No results found for this or any previous visit (from the past 240 hour(s)).   Labs: Basic Metabolic Panel: Recent Labs  Lab 04/27/17 1940 04/28/17 0355  NA  --   138  K  --  3.9  CL  --  104  CO2  --  28  GLUCOSE  --  119*  BUN  --  14  CREATININE  --  0.72  CALCIUM  --  8.9  MG 2.1  --    Liver Function Tests: No results for input(s): AST, ALT, ALKPHOS, BILITOT, PROT, ALBUMIN in the last 168 hours. No results for input(s): LIPASE, AMYLASE in the last 168 hours. No results for input(s): AMMONIA in the last 168 hours. CBC: Recent Labs  Lab 04/27/17 1940 04/28/17 0355  WBC 11.0* 9.5  NEUTROABS 8.6*  --   HGB 12.1* 11.3*  HCT 35.6* 34.1*  MCV 94.2 94.2  PLT 261 249   Cardiac Enzymes: Recent Labs  Lab 04/27/17 1940  CKTOTAL 33*   BNP: BNP (last 3 results) No results for input(s): BNP in the last 8760 hours.  ProBNP (last 3 results) No results for input(s): PROBNP in the last 8760 hours.  CBG: No results for input(s): GLUCAP in the last 168 hours.     Signed:  Florencia Reasons MD, PhD  [facility] 04/29/2017, 9:12 AM

## 2017-04-29 NOTE — Progress Notes (Signed)
[facility] arrived to transport patient to [place].

## 2017-05-02 DIAGNOSIS — M6281 Muscle weakness (generalized): Secondary | ICD-10-CM | POA: Diagnosis not present

## 2017-05-02 DIAGNOSIS — R41841 Cognitive communication deficit: Secondary | ICD-10-CM | POA: Diagnosis not present

## 2017-05-02 DIAGNOSIS — R278 Other lack of coordination: Secondary | ICD-10-CM | POA: Diagnosis not present

## 2017-05-02 DIAGNOSIS — I509 Heart failure, unspecified: Secondary | ICD-10-CM | POA: Diagnosis not present

## 2017-05-02 DIAGNOSIS — J101 Influenza due to other identified influenza virus with other respiratory manifestations: Secondary | ICD-10-CM | POA: Diagnosis not present

## 2017-05-02 DIAGNOSIS — R296 Repeated falls: Secondary | ICD-10-CM | POA: Diagnosis not present

## 2017-05-02 DIAGNOSIS — R2689 Other abnormalities of gait and mobility: Secondary | ICD-10-CM | POA: Diagnosis not present

## 2017-05-02 DIAGNOSIS — G20 Parkinson's disease: Secondary | ICD-10-CM | POA: Diagnosis not present

## 2017-05-02 DIAGNOSIS — I2510 Atherosclerotic heart disease of native coronary artery without angina pectoris: Secondary | ICD-10-CM | POA: Diagnosis not present

## 2017-05-03 DIAGNOSIS — R41841 Cognitive communication deficit: Secondary | ICD-10-CM | POA: Diagnosis not present

## 2017-05-03 DIAGNOSIS — R2689 Other abnormalities of gait and mobility: Secondary | ICD-10-CM | POA: Diagnosis not present

## 2017-05-03 DIAGNOSIS — R296 Repeated falls: Secondary | ICD-10-CM | POA: Diagnosis not present

## 2017-05-03 DIAGNOSIS — M6281 Muscle weakness (generalized): Secondary | ICD-10-CM | POA: Diagnosis not present

## 2017-05-03 DIAGNOSIS — R278 Other lack of coordination: Secondary | ICD-10-CM | POA: Diagnosis not present

## 2017-05-03 DIAGNOSIS — G20 Parkinson's disease: Secondary | ICD-10-CM | POA: Diagnosis not present

## 2017-05-04 ENCOUNTER — Ambulatory Visit: Payer: Medicare Other

## 2017-05-04 DIAGNOSIS — M6281 Muscle weakness (generalized): Secondary | ICD-10-CM | POA: Diagnosis not present

## 2017-05-04 DIAGNOSIS — R2689 Other abnormalities of gait and mobility: Secondary | ICD-10-CM | POA: Diagnosis not present

## 2017-05-04 DIAGNOSIS — R296 Repeated falls: Secondary | ICD-10-CM | POA: Diagnosis not present

## 2017-05-04 DIAGNOSIS — G20 Parkinson's disease: Secondary | ICD-10-CM | POA: Diagnosis not present

## 2017-05-04 DIAGNOSIS — R41841 Cognitive communication deficit: Secondary | ICD-10-CM | POA: Diagnosis not present

## 2017-05-04 DIAGNOSIS — R278 Other lack of coordination: Secondary | ICD-10-CM | POA: Diagnosis not present

## 2017-05-05 DIAGNOSIS — M25552 Pain in left hip: Secondary | ICD-10-CM | POA: Diagnosis not present

## 2017-05-05 DIAGNOSIS — R296 Repeated falls: Secondary | ICD-10-CM | POA: Diagnosis not present

## 2017-05-05 DIAGNOSIS — R278 Other lack of coordination: Secondary | ICD-10-CM | POA: Diagnosis not present

## 2017-05-05 DIAGNOSIS — F419 Anxiety disorder, unspecified: Secondary | ICD-10-CM | POA: Diagnosis not present

## 2017-05-05 DIAGNOSIS — R2689 Other abnormalities of gait and mobility: Secondary | ICD-10-CM | POA: Diagnosis not present

## 2017-05-05 DIAGNOSIS — M79652 Pain in left thigh: Secondary | ICD-10-CM | POA: Diagnosis not present

## 2017-05-05 DIAGNOSIS — R41841 Cognitive communication deficit: Secondary | ICD-10-CM | POA: Diagnosis not present

## 2017-05-05 DIAGNOSIS — F29 Unspecified psychosis not due to a substance or known physiological condition: Secondary | ICD-10-CM | POA: Diagnosis not present

## 2017-05-05 DIAGNOSIS — R4182 Altered mental status, unspecified: Secondary | ICD-10-CM | POA: Diagnosis not present

## 2017-05-05 DIAGNOSIS — G20 Parkinson's disease: Secondary | ICD-10-CM | POA: Diagnosis not present

## 2017-05-05 DIAGNOSIS — M6281 Muscle weakness (generalized): Secondary | ICD-10-CM | POA: Diagnosis not present

## 2017-05-06 DIAGNOSIS — S065X9A Traumatic subdural hemorrhage with loss of consciousness of unspecified duration, initial encounter: Secondary | ICD-10-CM | POA: Diagnosis present

## 2017-05-06 DIAGNOSIS — S40022A Contusion of left upper arm, initial encounter: Secondary | ICD-10-CM | POA: Diagnosis not present

## 2017-05-06 DIAGNOSIS — R402360 Coma scale, best motor response, obeys commands, unspecified time: Secondary | ICD-10-CM | POA: Diagnosis present

## 2017-05-06 DIAGNOSIS — I509 Heart failure, unspecified: Secondary | ICD-10-CM | POA: Diagnosis not present

## 2017-05-06 DIAGNOSIS — R410 Disorientation, unspecified: Secondary | ICD-10-CM | POA: Diagnosis not present

## 2017-05-06 DIAGNOSIS — R4020 Unspecified coma: Secondary | ICD-10-CM | POA: Diagnosis not present

## 2017-05-06 DIAGNOSIS — F29 Unspecified psychosis not due to a substance or known physiological condition: Secondary | ICD-10-CM | POA: Diagnosis not present

## 2017-05-06 DIAGNOSIS — M6281 Muscle weakness (generalized): Secondary | ICD-10-CM | POA: Diagnosis not present

## 2017-05-06 DIAGNOSIS — S0990XA Unspecified injury of head, initial encounter: Secondary | ICD-10-CM | POA: Diagnosis not present

## 2017-05-06 DIAGNOSIS — I5020 Unspecified systolic (congestive) heart failure: Secondary | ICD-10-CM | POA: Diagnosis not present

## 2017-05-06 DIAGNOSIS — I5022 Chronic systolic (congestive) heart failure: Secondary | ICD-10-CM | POA: Diagnosis present

## 2017-05-06 DIAGNOSIS — M1990 Unspecified osteoarthritis, unspecified site: Secondary | ICD-10-CM | POA: Diagnosis present

## 2017-05-06 DIAGNOSIS — I6200 Nontraumatic subdural hemorrhage, unspecified: Secondary | ICD-10-CM | POA: Diagnosis not present

## 2017-05-06 DIAGNOSIS — W19XXXA Unspecified fall, initial encounter: Secondary | ICD-10-CM | POA: Diagnosis not present

## 2017-05-06 DIAGNOSIS — I350 Nonrheumatic aortic (valve) stenosis: Secondary | ICD-10-CM | POA: Diagnosis present

## 2017-05-06 DIAGNOSIS — I2510 Atherosclerotic heart disease of native coronary artery without angina pectoris: Secondary | ICD-10-CM | POA: Diagnosis present

## 2017-05-06 DIAGNOSIS — G20 Parkinson's disease: Secondary | ICD-10-CM | POA: Diagnosis present

## 2017-05-06 DIAGNOSIS — W01198A Fall on same level from slipping, tripping and stumbling with subsequent striking against other object, initial encounter: Secondary | ICD-10-CM | POA: Diagnosis not present

## 2017-05-06 DIAGNOSIS — F05 Delirium due to known physiological condition: Secondary | ICD-10-CM | POA: Diagnosis present

## 2017-05-06 DIAGNOSIS — Z8673 Personal history of transient ischemic attack (TIA), and cerebral infarction without residual deficits: Secondary | ICD-10-CM | POA: Diagnosis not present

## 2017-05-06 DIAGNOSIS — Z952 Presence of prosthetic heart valve: Secondary | ICD-10-CM | POA: Diagnosis not present

## 2017-05-06 DIAGNOSIS — W1839XA Other fall on same level, initial encounter: Secondary | ICD-10-CM | POA: Diagnosis not present

## 2017-05-06 DIAGNOSIS — Z950 Presence of cardiac pacemaker: Secondary | ICD-10-CM | POA: Diagnosis not present

## 2017-05-06 DIAGNOSIS — K219 Gastro-esophageal reflux disease without esophagitis: Secondary | ICD-10-CM | POA: Diagnosis present

## 2017-05-06 DIAGNOSIS — R296 Repeated falls: Secondary | ICD-10-CM | POA: Diagnosis not present

## 2017-05-06 DIAGNOSIS — R4701 Aphasia: Secondary | ICD-10-CM | POA: Diagnosis present

## 2017-05-06 DIAGNOSIS — Z79899 Other long term (current) drug therapy: Secondary | ICD-10-CM | POA: Diagnosis not present

## 2017-05-06 DIAGNOSIS — Z951 Presence of aortocoronary bypass graft: Secondary | ICD-10-CM | POA: Diagnosis not present

## 2017-05-06 DIAGNOSIS — Y998 Other external cause status: Secondary | ICD-10-CM | POA: Diagnosis not present

## 2017-05-06 DIAGNOSIS — F0280 Dementia in other diseases classified elsewhere without behavioral disturbance: Secondary | ICD-10-CM | POA: Diagnosis not present

## 2017-05-06 DIAGNOSIS — Z888 Allergy status to other drugs, medicaments and biological substances status: Secondary | ICD-10-CM | POA: Diagnosis not present

## 2017-05-06 DIAGNOSIS — R402441 Other coma, without documented Glasgow coma scale score, or with partial score reported, in the field [EMT or ambulance]: Secondary | ICD-10-CM | POA: Diagnosis not present

## 2017-05-06 DIAGNOSIS — R41841 Cognitive communication deficit: Secondary | ICD-10-CM | POA: Diagnosis not present

## 2017-05-06 DIAGNOSIS — Z87828 Personal history of other (healed) physical injury and trauma: Secondary | ICD-10-CM | POA: Diagnosis not present

## 2017-05-06 DIAGNOSIS — D649 Anemia, unspecified: Secondary | ICD-10-CM | POA: Diagnosis not present

## 2017-05-06 DIAGNOSIS — Z66 Do not resuscitate: Secondary | ICD-10-CM | POA: Diagnosis present

## 2017-05-06 DIAGNOSIS — R278 Other lack of coordination: Secondary | ICD-10-CM | POA: Diagnosis not present

## 2017-05-06 DIAGNOSIS — R402240 Coma scale, best verbal response, confused conversation, unspecified time: Secondary | ICD-10-CM | POA: Diagnosis present

## 2017-05-06 DIAGNOSIS — S7002XA Contusion of left hip, initial encounter: Secondary | ICD-10-CM | POA: Diagnosis not present

## 2017-05-06 DIAGNOSIS — Z880 Allergy status to penicillin: Secondary | ICD-10-CM | POA: Diagnosis not present

## 2017-05-06 DIAGNOSIS — S0101XA Laceration without foreign body of scalp, initial encounter: Secondary | ICD-10-CM | POA: Diagnosis not present

## 2017-05-06 DIAGNOSIS — I110 Hypertensive heart disease with heart failure: Secondary | ICD-10-CM | POA: Diagnosis present

## 2017-05-06 DIAGNOSIS — Z7982 Long term (current) use of aspirin: Secondary | ICD-10-CM | POA: Diagnosis not present

## 2017-05-06 DIAGNOSIS — G9389 Other specified disorders of brain: Secondary | ICD-10-CM | POA: Diagnosis not present

## 2017-05-06 DIAGNOSIS — Z7409 Other reduced mobility: Secondary | ICD-10-CM | POA: Diagnosis not present

## 2017-05-06 DIAGNOSIS — S065X0A Traumatic subdural hemorrhage without loss of consciousness, initial encounter: Secondary | ICD-10-CM | POA: Diagnosis not present

## 2017-05-06 DIAGNOSIS — S40021A Contusion of right upper arm, initial encounter: Secondary | ICD-10-CM | POA: Diagnosis not present

## 2017-05-06 DIAGNOSIS — G935 Compression of brain: Secondary | ICD-10-CM | POA: Diagnosis not present

## 2017-05-06 DIAGNOSIS — R402140 Coma scale, eyes open, spontaneous, unspecified time: Secondary | ICD-10-CM | POA: Diagnosis present

## 2017-05-06 DIAGNOSIS — R4182 Altered mental status, unspecified: Secondary | ICD-10-CM | POA: Diagnosis not present

## 2017-05-06 DIAGNOSIS — R4789 Other speech disturbances: Secondary | ICD-10-CM | POA: Diagnosis not present

## 2017-05-06 DIAGNOSIS — E785 Hyperlipidemia, unspecified: Secondary | ICD-10-CM | POA: Diagnosis present

## 2017-05-06 DIAGNOSIS — Z87891 Personal history of nicotine dependence: Secondary | ICD-10-CM | POA: Diagnosis not present

## 2017-05-06 DIAGNOSIS — R2689 Other abnormalities of gait and mobility: Secondary | ICD-10-CM | POA: Diagnosis not present

## 2017-05-07 MED ORDER — POLYETHYLENE GLYCOL 3350 17 G PO PACK
17.00 g | PACK | ORAL | Status: DC
Start: ? — End: 2017-05-07

## 2017-05-07 MED ORDER — ACETAMINOPHEN 650 MG RE SUPP
650.00 | RECTAL | Status: DC
Start: ? — End: 2017-05-07

## 2017-05-07 MED ORDER — GENERIC EXTERNAL MEDICATION
2.00 | Status: DC
Start: ? — End: 2017-05-07

## 2017-05-07 MED ORDER — CARBIDOPA-LEVODOPA 25-100 MG PO TABS
2.00 | ORAL_TABLET | ORAL | Status: DC
Start: 2017-05-07 — End: 2017-05-07

## 2017-05-07 MED ORDER — MORPHINE SULFATE 4 MG/ML IJ SOLN
2.00 | INTRAMUSCULAR | Status: DC
Start: ? — End: 2017-05-07

## 2017-05-07 MED ORDER — GLYCOPYRROLATE 0.2 MG/ML IJ SOLN
0.40 | INTRAMUSCULAR | Status: DC
Start: ? — End: 2017-05-07

## 2017-05-07 MED ORDER — GENERIC EXTERNAL MEDICATION
1.00 | Status: DC
Start: 2017-05-07 — End: 2017-05-07

## 2017-05-07 MED ORDER — LORAZEPAM 2 MG/ML IJ SOLN
0.50 | INTRAMUSCULAR | Status: DC
Start: ? — End: 2017-05-07

## 2017-05-07 MED ORDER — ONDANSETRON HCL 4 MG/2ML IJ SOLN
4.00 | INTRAMUSCULAR | Status: DC
Start: ? — End: 2017-05-07

## 2017-05-07 MED ORDER — SODIUM CHLORIDE 0.9 % IV SOLN
INTRAVENOUS | Status: DC
Start: ? — End: 2017-05-07

## 2017-05-09 ENCOUNTER — Telehealth: Payer: Self-pay

## 2017-05-09 ENCOUNTER — Telehealth: Payer: Self-pay | Admitting: Cardiology

## 2017-05-09 DIAGNOSIS — I110 Hypertensive heart disease with heart failure: Secondary | ICD-10-CM | POA: Diagnosis not present

## 2017-05-09 DIAGNOSIS — R1310 Dysphagia, unspecified: Secondary | ICD-10-CM | POA: Diagnosis not present

## 2017-05-09 DIAGNOSIS — I442 Atrioventricular block, complete: Secondary | ICD-10-CM | POA: Diagnosis not present

## 2017-05-09 DIAGNOSIS — R4182 Altered mental status, unspecified: Secondary | ICD-10-CM | POA: Diagnosis not present

## 2017-05-09 DIAGNOSIS — Z9181 History of falling: Secondary | ICD-10-CM | POA: Diagnosis not present

## 2017-05-09 DIAGNOSIS — I2510 Atherosclerotic heart disease of native coronary artery without angina pectoris: Secondary | ICD-10-CM | POA: Diagnosis not present

## 2017-05-09 DIAGNOSIS — G20 Parkinson's disease: Secondary | ICD-10-CM | POA: Diagnosis not present

## 2017-05-09 DIAGNOSIS — M1990 Unspecified osteoarthritis, unspecified site: Secondary | ICD-10-CM | POA: Diagnosis not present

## 2017-05-09 DIAGNOSIS — Z950 Presence of cardiac pacemaker: Secondary | ICD-10-CM | POA: Diagnosis not present

## 2017-05-09 DIAGNOSIS — S065X0S Traumatic subdural hemorrhage without loss of consciousness, sequela: Secondary | ICD-10-CM | POA: Diagnosis not present

## 2017-05-09 DIAGNOSIS — I5042 Chronic combined systolic (congestive) and diastolic (congestive) heart failure: Secondary | ICD-10-CM | POA: Diagnosis not present

## 2017-05-09 DIAGNOSIS — Z87891 Personal history of nicotine dependence: Secondary | ICD-10-CM | POA: Diagnosis not present

## 2017-05-09 NOTE — Telephone Encounter (Signed)
Call back to wife as requested by voice mail message.  She said as of this afternoon, patient is in hospice and she does not think it will be very long that patient will be with her.  She appreciated all the care from physicians and nurses that the patient has received. She asked about the what to do with device monitor and advised to call number on box and they will instruct her what to do with the monitor.  Expressed my sympathy.  Message sent to Dr Sallyanne Kuster and Dr Gwenlyn Found.

## 2017-05-09 NOTE — Telephone Encounter (Signed)
LMOVM reminding pt to send remote transmission.   

## 2017-05-10 ENCOUNTER — Telehealth: Payer: Self-pay | Admitting: Internal Medicine

## 2017-05-10 DIAGNOSIS — G20 Parkinson's disease: Secondary | ICD-10-CM | POA: Diagnosis not present

## 2017-05-10 DIAGNOSIS — S065X0S Traumatic subdural hemorrhage without loss of consciousness, sequela: Secondary | ICD-10-CM | POA: Diagnosis not present

## 2017-05-10 DIAGNOSIS — I2510 Atherosclerotic heart disease of native coronary artery without angina pectoris: Secondary | ICD-10-CM | POA: Diagnosis not present

## 2017-05-10 DIAGNOSIS — Z9181 History of falling: Secondary | ICD-10-CM | POA: Diagnosis not present

## 2017-05-10 DIAGNOSIS — R1310 Dysphagia, unspecified: Secondary | ICD-10-CM | POA: Diagnosis not present

## 2017-05-10 DIAGNOSIS — R4182 Altered mental status, unspecified: Secondary | ICD-10-CM | POA: Diagnosis not present

## 2017-05-10 NOTE — Telephone Encounter (Signed)
Copied from [place] [id number]. Topic: Quick Communication - See Telephone Encounter >> May 10, 2017  1:43 PM Bea Graff, NT wrote: CRM for notification. See Telephone encounter for: Pts wife was returning call to Dr. Silvio Pate, no message in chart that he called pt. She wanted to let Dr. Silvio Pate know that her husband is in hospice care and they do not think it will be much longer that he is in the end stage of his life. She appreciated so much for Dr. Silvio Pate calling to check on him.  05/10/17.

## 2017-05-11 DIAGNOSIS — R1310 Dysphagia, unspecified: Secondary | ICD-10-CM | POA: Diagnosis not present

## 2017-05-11 DIAGNOSIS — S065X0S Traumatic subdural hemorrhage without loss of consciousness, sequela: Secondary | ICD-10-CM | POA: Diagnosis not present

## 2017-05-11 DIAGNOSIS — I2510 Atherosclerotic heart disease of native coronary artery without angina pectoris: Secondary | ICD-10-CM | POA: Diagnosis not present

## 2017-05-11 DIAGNOSIS — R4182 Altered mental status, unspecified: Secondary | ICD-10-CM | POA: Diagnosis not present

## 2017-05-11 DIAGNOSIS — Z9181 History of falling: Secondary | ICD-10-CM | POA: Diagnosis not present

## 2017-05-11 DIAGNOSIS — G20 Parkinson's disease: Secondary | ICD-10-CM | POA: Diagnosis not present

## 2017-05-12 ENCOUNTER — Telehealth: Payer: Self-pay

## 2017-05-12 NOTE — Telephone Encounter (Signed)
Copied from [place] [phone]. Topic: General - Deceased Patient >> May 27, 2017 11:23 AM Bea Graff, NT wrote: Reason for CRM: Pts son Nancy Manuele calling to let the office know his dad passed away on 2017-05-26 due to a subdural hematoma from a fall 2 weeks ago.   Route to department's PEC Pool.

## 2017-05-12 NOTE — Telephone Encounter (Signed)
I called and spoke to her to offer my condolences

## 2017-05-20 NOTE — Telephone Encounter (Signed)
Sorry to hear about this. He had fallen, then went to SNF at his facility and worsened. Found to have SDH and worsened then back to hospital

## 2017-05-20 DEATH — deceased

## 2017-07-07 ENCOUNTER — Ambulatory Visit: Payer: Medicare Other | Admitting: Neurology

## 2017-11-01 ENCOUNTER — Encounter: Payer: Medicare Other | Admitting: Internal Medicine

## 2018-09-12 IMAGING — CR DG KNEE COMPLETE 4+V*L*
4 series · 4 of 4 positions shown · non-contrast
Comparison: None.

CLINICAL DATA: Fell tonight.  Injured left leg

EXAM:
LEFT KNEE - COMPLETE 4+ VIEW

[knee ap]
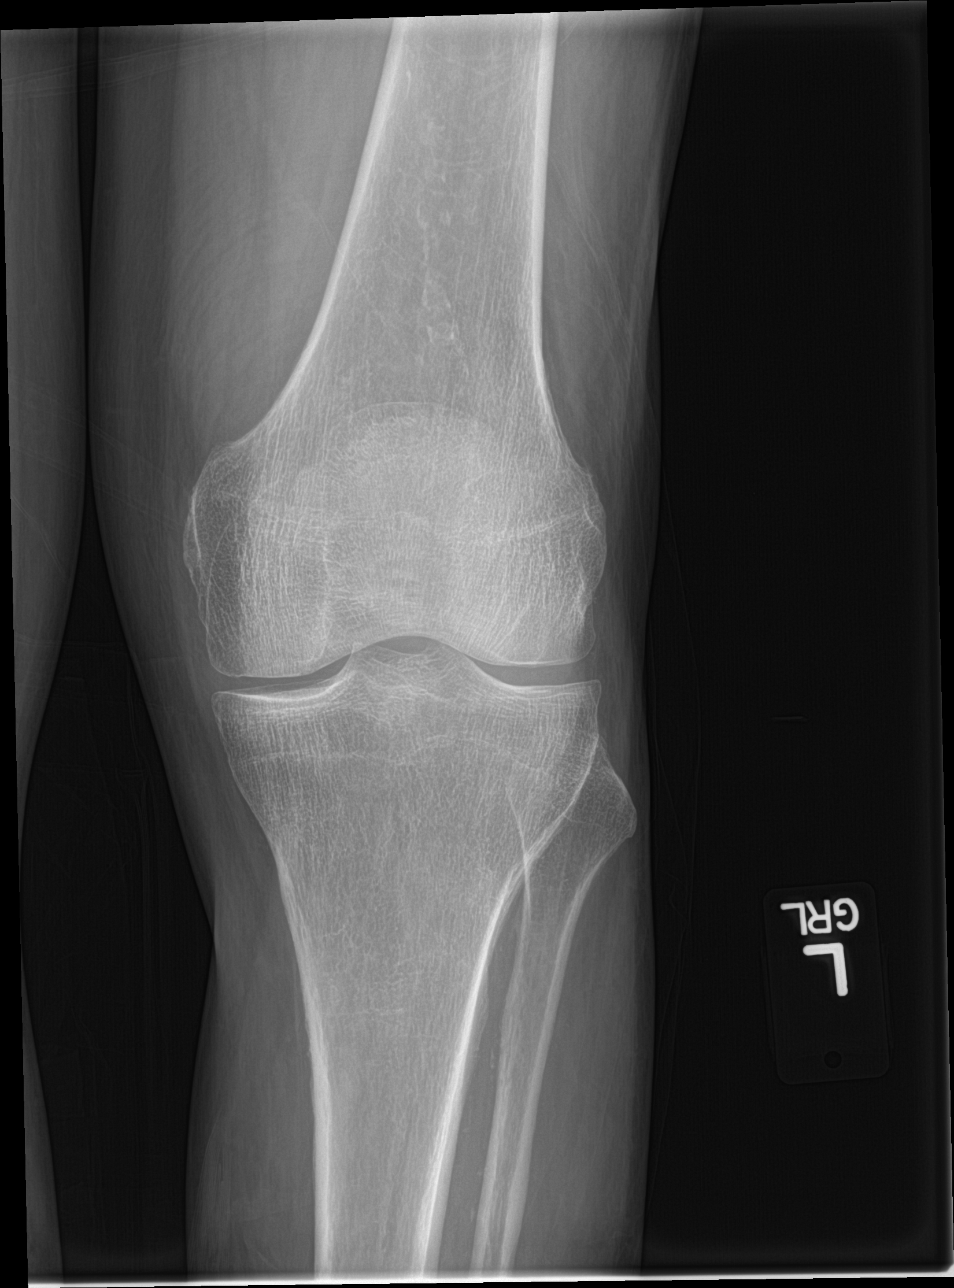

[knee obl (1 of 2)]
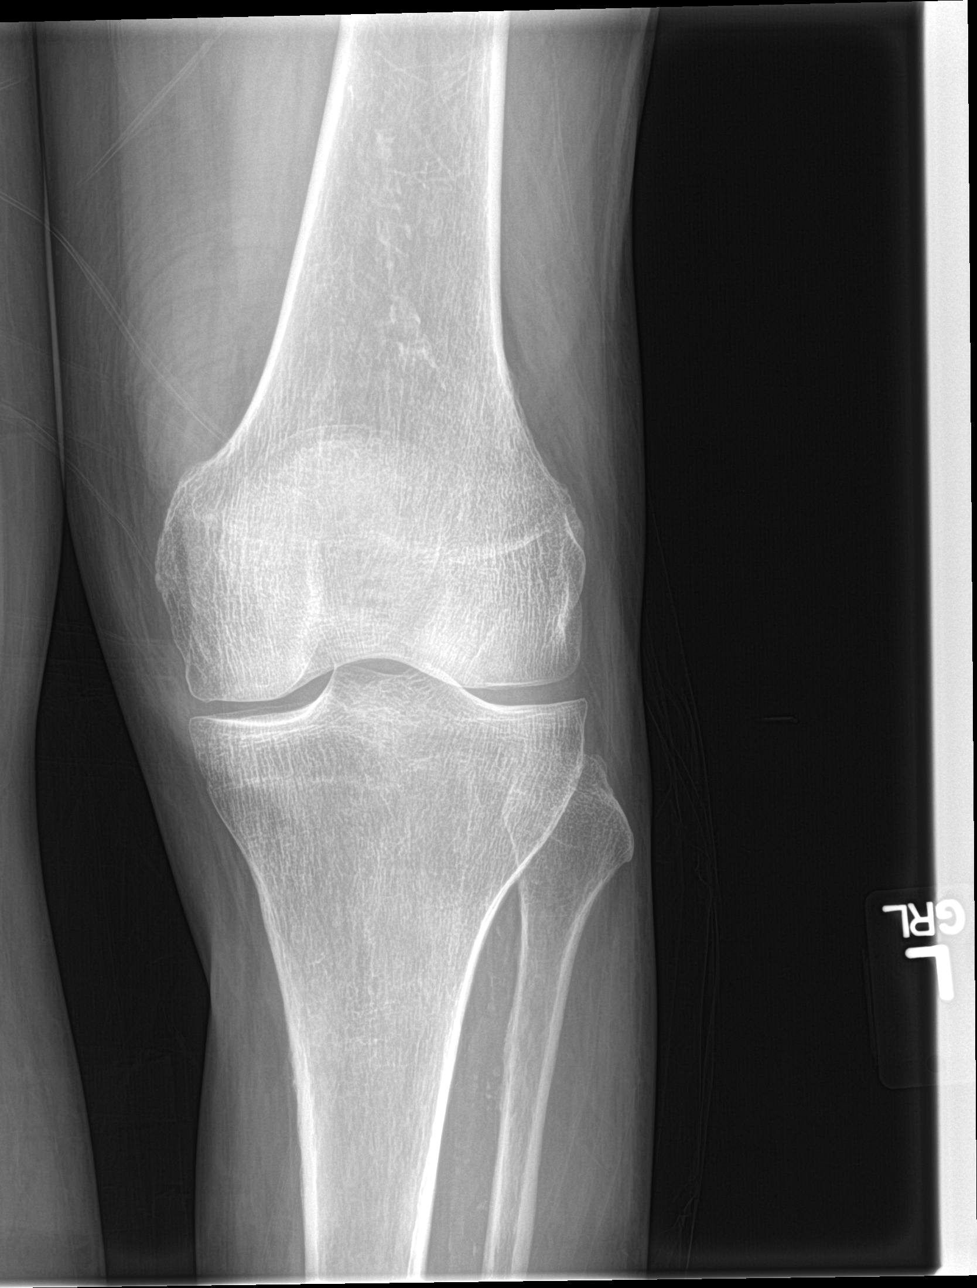

[knee obl (2 of 2)]
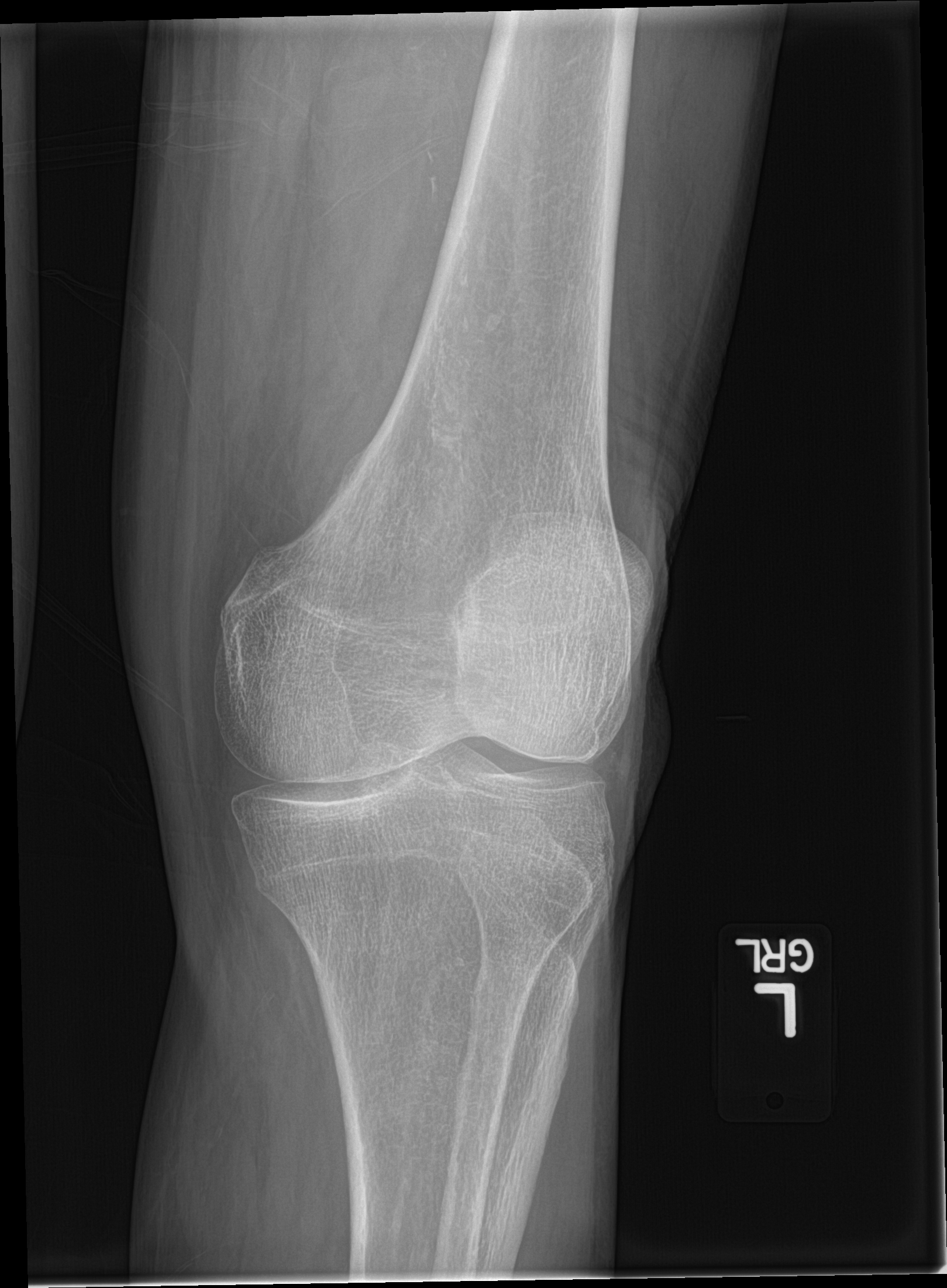

[knee lat]
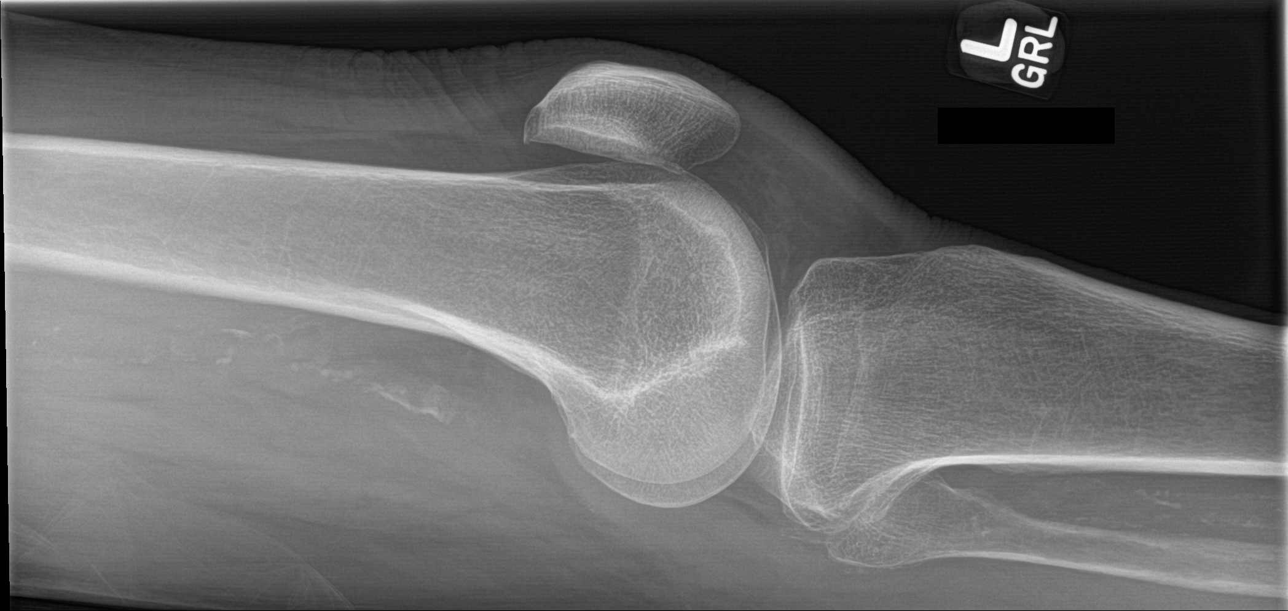

[4 of 4 positions shown; findings below may reference images not displayed]

FINDINGS: The joint spaces are fairly well maintained for the patient's age.
No significant degenerative changes, acute fracture or osteochondral
lesion. No joint effusion. Moderate atherosclerotic calcifications.
IMPRESSION: No acute bony findings, significant degenerative changes or joint
effusion.

## 2018-09-13 IMAGING — DX DG HIP (WITH OR WITHOUT PELVIS) 2-3V*L*
3 series · 3 of 3 positions shown · non-contrast
Comparison: 01/05/2016

CLINICAL DATA: Postoperative pain after left anterior hip surgery.

EXAM:
DG HIP (WITH OR WITHOUT PELVIS) 2-3V LEFT

[pelvis ap]
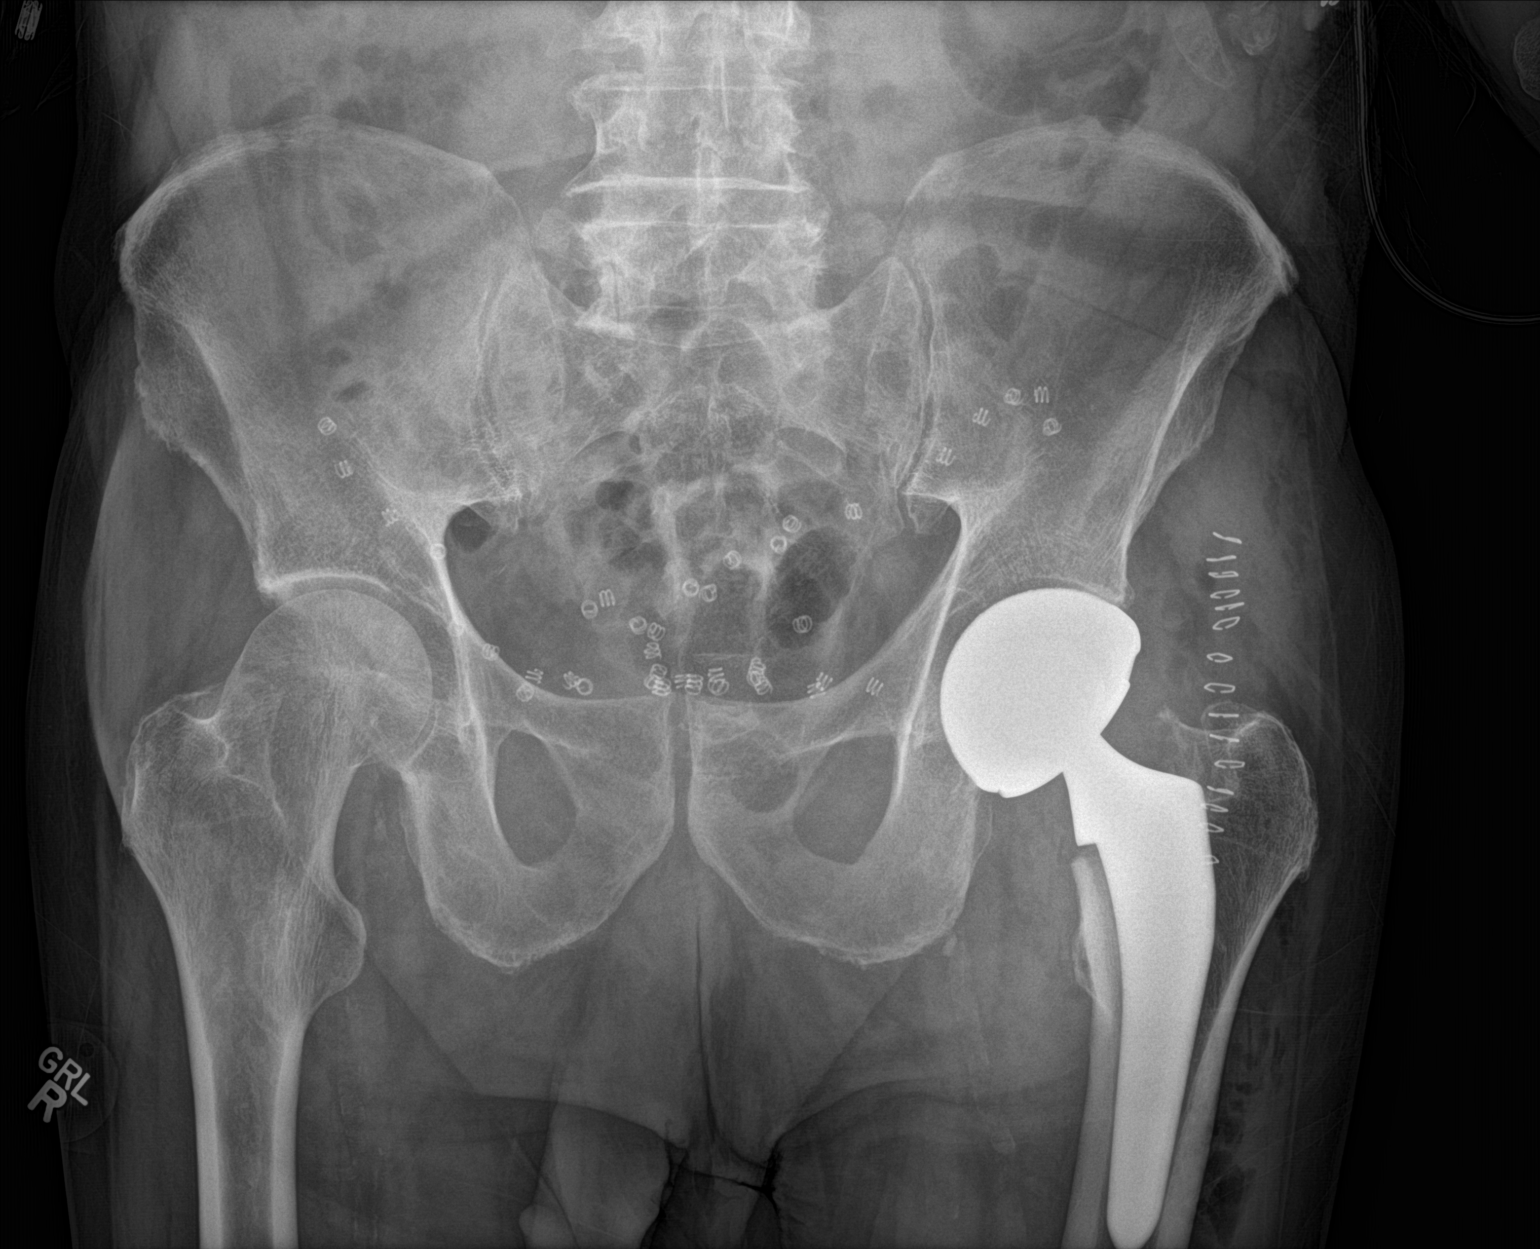

[hip ap]
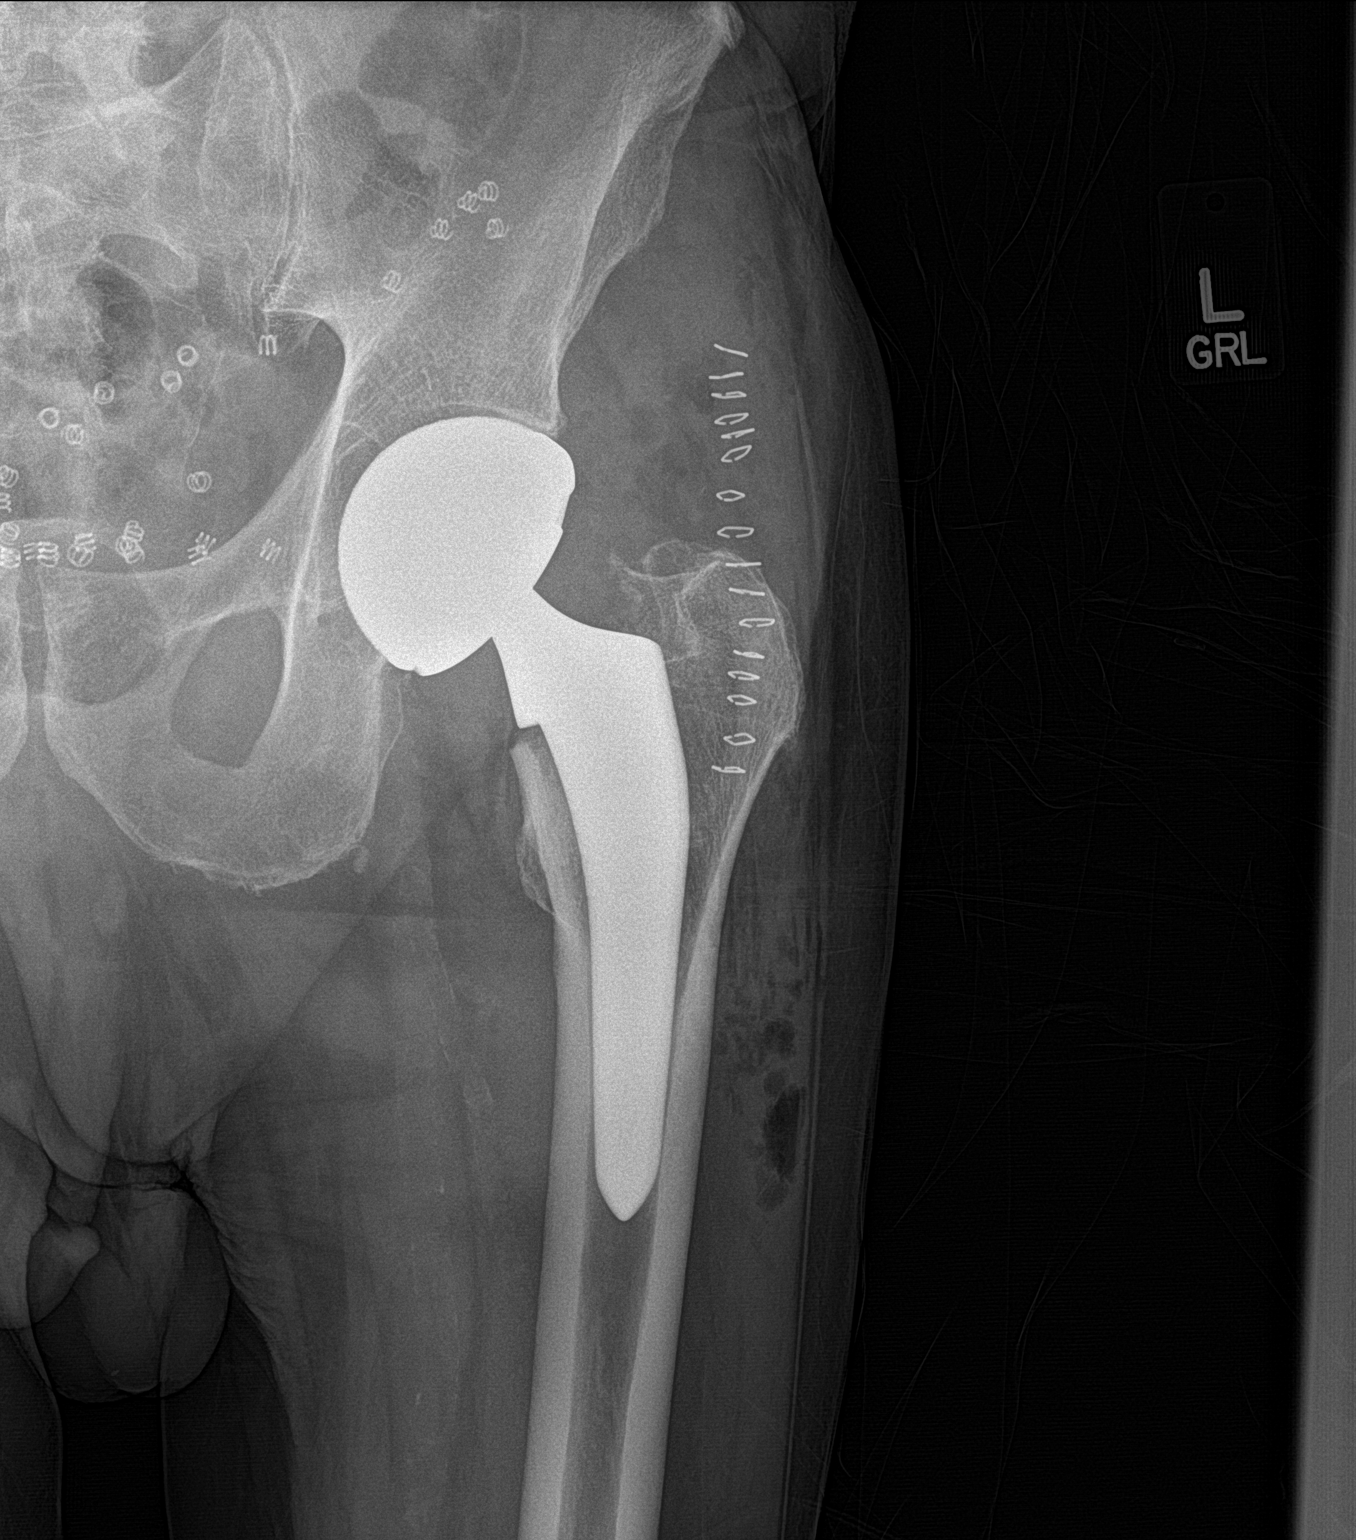

[hip lat]
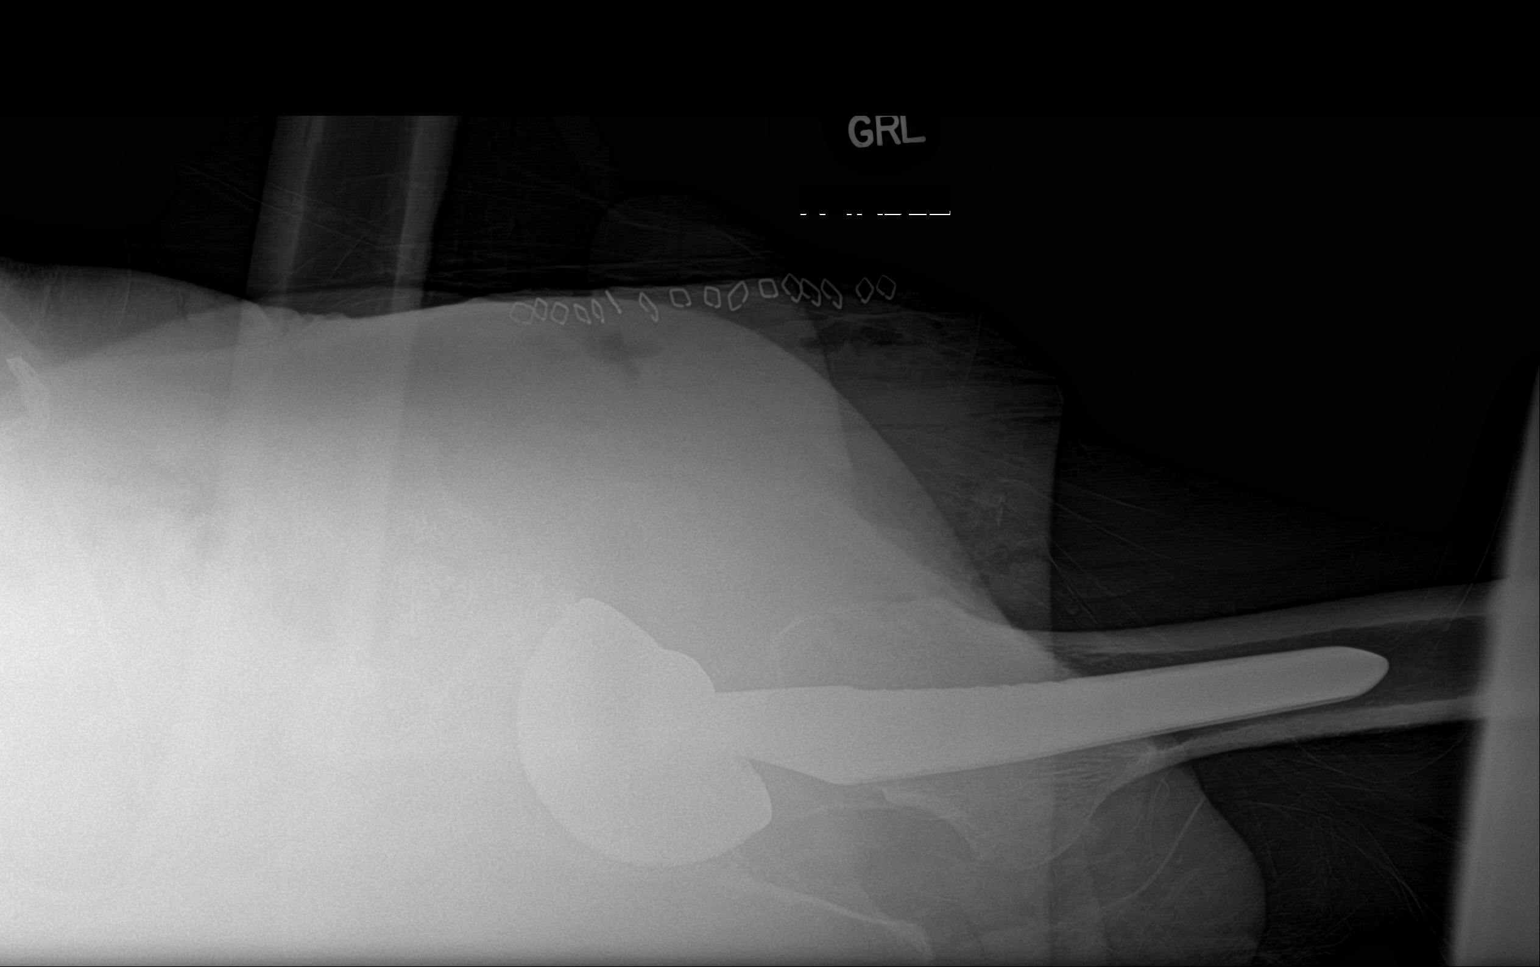

[3 of 3 positions shown; findings below may reference images not displayed]

FINDINGS: Interval postoperative changes with left hip hemiarthroplasty using
non cemented components. Components appear well seated. No evidence
of acute fracture or dislocation. Skin clips and soft tissue gas are
consistent with recent surgery.

Pelvis appears intact. Surgical clips in the pelvis consistent with
mesh hernia repair. Vascular calcifications. Degenerative changes in
the lower lumbar spine.
IMPRESSION: Left hip hemiarthroplasty. Components appear well seated. No acute
fracture or dislocation.
